# Patient Record
Sex: Female | Born: 1965
Health system: Southern US, Community
[De-identification: ages and names within clinical notes are randomized; demographics above are authoritative.]

## PROBLEM LIST (undated history)

## (undated) DIAGNOSIS — Z5189 Encounter for other specified aftercare: Secondary | ICD-10-CM

## (undated) DIAGNOSIS — A6000 Herpesviral infection of urogenital system, unspecified: Secondary | ICD-10-CM

## (undated) HISTORY — DX: Encounter for other specified aftercare: Z51.89

## (undated) HISTORY — PX: SPINE SURGERY: SHX786

## (undated) HISTORY — DX: Herpesviral infection of urogenital system, unspecified: A60.00

---

## 1997-12-07 ENCOUNTER — Ambulatory Visit (HOSPITAL_COMMUNITY): Admission: RE | Admit: 1997-12-07 | Discharge: 1997-12-07 | Payer: Self-pay | Admitting: Obstetrics and Gynecology

## 1999-07-20 ENCOUNTER — Emergency Department (HOSPITAL_COMMUNITY): Admission: EM | Admit: 1999-07-20 | Discharge: 1999-07-20 | Payer: Self-pay | Admitting: Emergency Medicine

## 2000-10-04 ENCOUNTER — Emergency Department (HOSPITAL_COMMUNITY): Admission: EM | Admit: 2000-10-04 | Discharge: 2000-10-04 | Payer: Self-pay | Admitting: Emergency Medicine

## 2001-11-06 ENCOUNTER — Other Ambulatory Visit: Admission: RE | Admit: 2001-11-06 | Discharge: 2001-11-06 | Payer: Self-pay | Admitting: Family Medicine

## 2002-03-19 ENCOUNTER — Encounter: Payer: Self-pay | Admitting: Obstetrics and Gynecology

## 2002-03-19 ENCOUNTER — Ambulatory Visit (HOSPITAL_COMMUNITY): Admission: RE | Admit: 2002-03-19 | Discharge: 2002-03-19 | Payer: Self-pay | Admitting: Obstetrics and Gynecology

## 2002-08-12 ENCOUNTER — Inpatient Hospital Stay (HOSPITAL_COMMUNITY): Admission: AD | Admit: 2002-08-12 | Discharge: 2002-08-15 | Payer: Self-pay | Admitting: Obstetrics and Gynecology

## 2003-02-18 ENCOUNTER — Other Ambulatory Visit: Admission: RE | Admit: 2003-02-18 | Discharge: 2003-02-18 | Payer: Self-pay | Admitting: Obstetrics and Gynecology

## 2005-04-17 ENCOUNTER — Other Ambulatory Visit: Admission: RE | Admit: 2005-04-17 | Discharge: 2005-04-17 | Payer: Self-pay | Admitting: Emergency Medicine

## 2006-04-17 ENCOUNTER — Inpatient Hospital Stay (HOSPITAL_COMMUNITY): Admission: AD | Admit: 2006-04-17 | Discharge: 2006-04-18 | Payer: Self-pay | Admitting: Obstetrics and Gynecology

## 2013-03-04 ENCOUNTER — Other Ambulatory Visit (HOSPITAL_COMMUNITY)
Admission: RE | Admit: 2013-03-04 | Discharge: 2013-03-04 | Disposition: A | Discharge status: Home / Self Care | Payer: BC Managed Care – PPO | Source: Ambulatory Visit

## 2013-03-04 ENCOUNTER — Other Ambulatory Visit: Payer: Self-pay

## 2013-03-04 DIAGNOSIS — Z124 Encounter for screening for malignant neoplasm of cervix: Secondary | ICD-10-CM | POA: Insufficient documentation

## 2016-02-21 ENCOUNTER — Other Ambulatory Visit: Payer: Self-pay

## 2016-02-21 DIAGNOSIS — Z1231 Encounter for screening mammogram for malignant neoplasm of breast: Secondary | ICD-10-CM

## 2016-03-11 ENCOUNTER — Other Ambulatory Visit (HOSPITAL_COMMUNITY)
Admission: RE | Admit: 2016-03-11 | Discharge: 2016-03-11 | Disposition: A | Discharge status: Home / Self Care | Payer: Commercial Managed Care - PPO | Source: Ambulatory Visit | Attending: Family Medicine | Admitting: Family Medicine

## 2016-03-11 ENCOUNTER — Other Ambulatory Visit: Payer: Self-pay | Admitting: Family Medicine

## 2016-03-11 ENCOUNTER — Ambulatory Visit
Admission: RE | Admit: 2016-03-11 | Discharge: 2016-03-11 | Disposition: A | Discharge status: Home / Self Care | Payer: Commercial Managed Care - PPO | Source: Ambulatory Visit

## 2016-03-11 DIAGNOSIS — Z01419 Encounter for gynecological examination (general) (routine) without abnormal findings: Secondary | ICD-10-CM | POA: Diagnosis not present

## 2016-03-11 DIAGNOSIS — Z1231 Encounter for screening mammogram for malignant neoplasm of breast: Secondary | ICD-10-CM

## 2016-03-12 LAB — CYTOLOGY - PAP

## 2017-02-17 ENCOUNTER — Other Ambulatory Visit: Payer: Self-pay | Admitting: Family Medicine

## 2017-02-17 DIAGNOSIS — Z1231 Encounter for screening mammogram for malignant neoplasm of breast: Secondary | ICD-10-CM

## 2017-03-12 ENCOUNTER — Ambulatory Visit
Admission: RE | Admit: 2017-03-12 | Discharge: 2017-03-12 | Disposition: A | Discharge status: Home / Self Care | Payer: Commercial Managed Care - PPO | Source: Ambulatory Visit | Attending: Family Medicine | Admitting: Family Medicine

## 2017-03-12 DIAGNOSIS — Z131 Encounter for screening for diabetes mellitus: Secondary | ICD-10-CM | POA: Diagnosis not present

## 2017-03-12 DIAGNOSIS — Z1231 Encounter for screening mammogram for malignant neoplasm of breast: Secondary | ICD-10-CM | POA: Diagnosis not present

## 2017-03-12 DIAGNOSIS — Z0000 Encounter for general adult medical examination without abnormal findings: Secondary | ICD-10-CM | POA: Diagnosis not present

## 2017-03-12 DIAGNOSIS — Z13220 Encounter for screening for lipoid disorders: Secondary | ICD-10-CM | POA: Diagnosis not present

## 2018-03-09 ENCOUNTER — Other Ambulatory Visit: Payer: Self-pay | Admitting: Family Medicine

## 2018-03-09 DIAGNOSIS — Z1231 Encounter for screening mammogram for malignant neoplasm of breast: Secondary | ICD-10-CM

## 2018-03-19 DIAGNOSIS — Z131 Encounter for screening for diabetes mellitus: Secondary | ICD-10-CM | POA: Diagnosis not present

## 2018-03-19 DIAGNOSIS — Z0000 Encounter for general adult medical examination without abnormal findings: Secondary | ICD-10-CM | POA: Diagnosis not present

## 2018-03-19 DIAGNOSIS — Z1211 Encounter for screening for malignant neoplasm of colon: Secondary | ICD-10-CM | POA: Diagnosis not present

## 2018-03-19 DIAGNOSIS — Z13220 Encounter for screening for lipoid disorders: Secondary | ICD-10-CM | POA: Diagnosis not present

## 2018-03-30 ENCOUNTER — Ambulatory Visit: Payer: Commercial Managed Care - PPO

## 2018-04-20 ENCOUNTER — Ambulatory Visit: Payer: Commercial Managed Care - PPO

## 2018-04-30 ENCOUNTER — Ambulatory Visit
Admission: RE | Admit: 2018-04-30 | Discharge: 2018-04-30 | Disposition: A | Discharge status: Home / Self Care | Payer: Commercial Managed Care - PPO | Source: Ambulatory Visit | Attending: Family Medicine | Admitting: Family Medicine

## 2018-04-30 DIAGNOSIS — Z1231 Encounter for screening mammogram for malignant neoplasm of breast: Secondary | ICD-10-CM

## 2018-05-11 DIAGNOSIS — Z1211 Encounter for screening for malignant neoplasm of colon: Secondary | ICD-10-CM | POA: Diagnosis not present

## 2019-03-23 ENCOUNTER — Other Ambulatory Visit: Payer: Self-pay | Admitting: Family Medicine

## 2019-03-23 DIAGNOSIS — Z1231 Encounter for screening mammogram for malignant neoplasm of breast: Secondary | ICD-10-CM

## 2019-04-09 ENCOUNTER — Other Ambulatory Visit: Payer: Self-pay | Admitting: Family Medicine

## 2019-04-09 ENCOUNTER — Other Ambulatory Visit (HOSPITAL_COMMUNITY)
Admission: RE | Admit: 2019-04-09 | Discharge: 2019-04-09 | Disposition: A | Discharge status: Home / Self Care | Payer: Commercial Managed Care - PPO | Source: Ambulatory Visit | Attending: Family Medicine | Admitting: Family Medicine

## 2019-04-09 DIAGNOSIS — Z01411 Encounter for gynecological examination (general) (routine) with abnormal findings: Secondary | ICD-10-CM | POA: Diagnosis not present

## 2019-04-13 LAB — CYTOLOGY - PAP
Diagnosis: NEGATIVE
HPV: NOT DETECTED

## 2019-05-07 ENCOUNTER — Ambulatory Visit
Admission: RE | Admit: 2019-05-07 | Discharge: 2019-05-07 | Disposition: A | Discharge status: Home / Self Care | Payer: Commercial Managed Care - PPO | Source: Ambulatory Visit | Attending: Family Medicine | Admitting: Family Medicine

## 2019-05-07 ENCOUNTER — Other Ambulatory Visit: Payer: Self-pay

## 2019-05-07 DIAGNOSIS — Z1231 Encounter for screening mammogram for malignant neoplasm of breast: Secondary | ICD-10-CM

## 2019-05-11 ENCOUNTER — Other Ambulatory Visit: Payer: Self-pay | Admitting: Family Medicine

## 2019-05-11 DIAGNOSIS — R928 Other abnormal and inconclusive findings on diagnostic imaging of breast: Secondary | ICD-10-CM

## 2019-05-13 ENCOUNTER — Ambulatory Visit: Payer: Commercial Managed Care - PPO

## 2019-05-13 ENCOUNTER — Ambulatory Visit
Admission: RE | Admit: 2019-05-13 | Discharge: 2019-05-13 | Disposition: A | Discharge status: Home / Self Care | Payer: Commercial Managed Care - PPO | Source: Ambulatory Visit | Attending: Family Medicine | Admitting: Family Medicine

## 2019-05-13 ENCOUNTER — Other Ambulatory Visit: Payer: Self-pay

## 2019-05-13 DIAGNOSIS — R928 Other abnormal and inconclusive findings on diagnostic imaging of breast: Secondary | ICD-10-CM

## 2019-12-18 ENCOUNTER — Emergency Department (HOSPITAL_COMMUNITY): Payer: Commercial Managed Care - PPO

## 2019-12-18 ENCOUNTER — Inpatient Hospital Stay (HOSPITAL_COMMUNITY)
Admission: EM | Admit: 2019-12-18 | Discharge: 2020-01-11 | DRG: 853 | Disposition: A | Discharge status: Rehabilitation Facility | Payer: Commercial Managed Care - PPO | Attending: Internal Medicine | Admitting: Internal Medicine

## 2019-12-18 ENCOUNTER — Encounter (HOSPITAL_COMMUNITY): Payer: Self-pay | Admitting: Emergency Medicine

## 2019-12-18 ENCOUNTER — Other Ambulatory Visit: Payer: Self-pay

## 2019-12-18 DIAGNOSIS — Z20822 Contact with and (suspected) exposure to covid-19: Secondary | ICD-10-CM | POA: Diagnosis present

## 2019-12-18 DIAGNOSIS — K0889 Other specified disorders of teeth and supporting structures: Secondary | ICD-10-CM | POA: Diagnosis present

## 2019-12-18 DIAGNOSIS — K0881 Primary occlusal trauma: Secondary | ICD-10-CM | POA: Diagnosis present

## 2019-12-18 DIAGNOSIS — Z972 Presence of dental prosthetic device (complete) (partial): Secondary | ICD-10-CM

## 2019-12-18 DIAGNOSIS — K036 Deposits [accretions] on teeth: Secondary | ICD-10-CM | POA: Diagnosis present

## 2019-12-18 DIAGNOSIS — R55 Syncope and collapse: Secondary | ICD-10-CM | POA: Diagnosis not present

## 2019-12-18 DIAGNOSIS — S0181XA Laceration without foreign body of other part of head, initial encounter: Secondary | ICD-10-CM | POA: Diagnosis present

## 2019-12-18 DIAGNOSIS — M270 Developmental disorders of jaws: Secondary | ICD-10-CM | POA: Diagnosis present

## 2019-12-18 DIAGNOSIS — K08409 Partial loss of teeth, unspecified cause, unspecified class: Secondary | ICD-10-CM | POA: Diagnosis present

## 2019-12-18 DIAGNOSIS — K029 Dental caries, unspecified: Secondary | ICD-10-CM | POA: Diagnosis present

## 2019-12-18 DIAGNOSIS — K083 Retained dental root: Secondary | ICD-10-CM | POA: Diagnosis present

## 2019-12-18 DIAGNOSIS — M4302 Spondylolysis, cervical region: Secondary | ICD-10-CM | POA: Diagnosis present

## 2019-12-18 DIAGNOSIS — M2578 Osteophyte, vertebrae: Secondary | ICD-10-CM | POA: Diagnosis present

## 2019-12-18 DIAGNOSIS — G8250 Quadriplegia, unspecified: Secondary | ICD-10-CM

## 2019-12-18 DIAGNOSIS — Z880 Allergy status to penicillin: Secondary | ICD-10-CM

## 2019-12-18 DIAGNOSIS — G9589 Other specified diseases of spinal cord: Secondary | ICD-10-CM | POA: Diagnosis present

## 2019-12-18 DIAGNOSIS — I2694 Multiple subsegmental pulmonary emboli without acute cor pulmonale: Secondary | ICD-10-CM | POA: Diagnosis present

## 2019-12-18 DIAGNOSIS — K045 Chronic apical periodontitis: Secondary | ICD-10-CM | POA: Diagnosis present

## 2019-12-18 DIAGNOSIS — K254 Chronic or unspecified gastric ulcer with hemorrhage: Secondary | ICD-10-CM | POA: Diagnosis not present

## 2019-12-18 DIAGNOSIS — I959 Hypotension, unspecified: Secondary | ICD-10-CM

## 2019-12-18 DIAGNOSIS — K06020 Generalized gingival recession, unspecified: Secondary | ICD-10-CM | POA: Diagnosis present

## 2019-12-18 DIAGNOSIS — K3189 Other diseases of stomach and duodenum: Secondary | ICD-10-CM | POA: Diagnosis present

## 2019-12-18 DIAGNOSIS — R571 Hypovolemic shock: Principal | ICD-10-CM | POA: Diagnosis present

## 2019-12-18 DIAGNOSIS — M2630 Unspecified anomaly of tooth position of fully erupted tooth or teeth: Secondary | ICD-10-CM | POA: Diagnosis present

## 2019-12-18 DIAGNOSIS — E440 Moderate protein-calorie malnutrition: Secondary | ICD-10-CM | POA: Insufficient documentation

## 2019-12-18 DIAGNOSIS — Z87891 Personal history of nicotine dependence: Secondary | ICD-10-CM

## 2019-12-18 DIAGNOSIS — M4802 Spinal stenosis, cervical region: Secondary | ICD-10-CM

## 2019-12-18 DIAGNOSIS — S14153A Other incomplete lesion at C3 level of cervical spinal cord, initial encounter: Secondary | ICD-10-CM | POA: Diagnosis present

## 2019-12-18 DIAGNOSIS — Z98891 History of uterine scar from previous surgery: Secondary | ICD-10-CM

## 2019-12-18 DIAGNOSIS — R7881 Bacteremia: Secondary | ICD-10-CM

## 2019-12-18 DIAGNOSIS — K089 Disorder of teeth and supporting structures, unspecified: Secondary | ICD-10-CM

## 2019-12-18 DIAGNOSIS — A408 Other streptococcal sepsis: Secondary | ICD-10-CM | POA: Diagnosis not present

## 2019-12-18 DIAGNOSIS — F1290 Cannabis use, unspecified, uncomplicated: Secondary | ICD-10-CM | POA: Diagnosis present

## 2019-12-18 DIAGNOSIS — Z419 Encounter for procedure for purposes other than remedying health state, unspecified: Secondary | ICD-10-CM

## 2019-12-18 DIAGNOSIS — E876 Hypokalemia: Secondary | ICD-10-CM | POA: Diagnosis present

## 2019-12-18 DIAGNOSIS — N179 Acute kidney failure, unspecified: Secondary | ICD-10-CM | POA: Diagnosis present

## 2019-12-18 DIAGNOSIS — Y92009 Unspecified place in unspecified non-institutional (private) residence as the place of occurrence of the external cause: Secondary | ICD-10-CM

## 2019-12-18 DIAGNOSIS — G8254 Quadriplegia, C5-C7 incomplete: Secondary | ICD-10-CM

## 2019-12-18 DIAGNOSIS — W1839XA Other fall on same level, initial encounter: Secondary | ICD-10-CM | POA: Diagnosis present

## 2019-12-18 DIAGNOSIS — F40232 Fear of other medical care: Secondary | ICD-10-CM | POA: Diagnosis present

## 2019-12-18 DIAGNOSIS — W19XXXA Unspecified fall, initial encounter: Secondary | ICD-10-CM

## 2019-12-18 DIAGNOSIS — Z681 Body mass index (BMI) 19 or less, adult: Secondary | ICD-10-CM

## 2019-12-18 DIAGNOSIS — J309 Allergic rhinitis, unspecified: Secondary | ICD-10-CM | POA: Diagnosis present

## 2019-12-18 DIAGNOSIS — Z803 Family history of malignant neoplasm of breast: Secondary | ICD-10-CM

## 2019-12-18 DIAGNOSIS — E860 Dehydration: Secondary | ICD-10-CM | POA: Diagnosis present

## 2019-12-18 DIAGNOSIS — D62 Acute posthemorrhagic anemia: Secondary | ICD-10-CM | POA: Diagnosis not present

## 2019-12-18 DIAGNOSIS — M5001 Cervical disc disorder with myelopathy,  high cervical region: Secondary | ICD-10-CM | POA: Diagnosis present

## 2019-12-18 LAB — CBC
HCT: 34 % — ABNORMAL LOW (ref 36.0–46.0)
Hemoglobin: 10.7 g/dL — ABNORMAL LOW (ref 12.0–15.0)
MCH: 29.6 pg (ref 26.0–34.0)
MCHC: 31.5 g/dL (ref 30.0–36.0)
MCV: 94.2 fL (ref 80.0–100.0)
Platelets: 260 10*3/uL (ref 150–400)
RBC: 3.61 MIL/uL — ABNORMAL LOW (ref 3.87–5.11)
RDW: 11.9 % (ref 11.5–15.5)
WBC: 7.9 10*3/uL (ref 4.0–10.5)
nRBC: 0 % (ref 0.0–0.2)

## 2019-12-18 LAB — I-STAT CHEM 8, ED
BUN: 6 mg/dL (ref 6–20)
Calcium, Ion: 1.12 mmol/L — ABNORMAL LOW (ref 1.15–1.40)
Chloride: 106 mmol/L (ref 98–111)
Creatinine, Ser: 1.1 mg/dL — ABNORMAL HIGH (ref 0.44–1.00)
Glucose, Bld: 124 mg/dL — ABNORMAL HIGH (ref 70–99)
HCT: 31 % — ABNORMAL LOW (ref 36.0–46.0)
Hemoglobin: 10.5 g/dL — ABNORMAL LOW (ref 12.0–15.0)
Potassium: 3.3 mmol/L — ABNORMAL LOW (ref 3.5–5.1)
Sodium: 141 mmol/L (ref 135–145)
TCO2: 22 mmol/L (ref 22–32)

## 2019-12-18 LAB — COMPREHENSIVE METABOLIC PANEL
ALT: 11 U/L (ref 0–44)
AST: 19 U/L (ref 15–41)
Albumin: 3.1 g/dL — ABNORMAL LOW (ref 3.5–5.0)
Alkaline Phosphatase: 130 U/L — ABNORMAL HIGH (ref 38–126)
Anion gap: 6 (ref 5–15)
BUN: 7 mg/dL (ref 6–20)
CO2: 22 mmol/L (ref 22–32)
Calcium: 8.2 mg/dL — ABNORMAL LOW (ref 8.9–10.3)
Chloride: 111 mmol/L (ref 98–111)
Creatinine, Ser: 1.19 mg/dL — ABNORMAL HIGH (ref 0.44–1.00)
GFR calc Af Amer: >60 mL/min (ref >60)
GFR calc non Af Amer: 52 mL/min — ABNORMAL LOW (ref >60)
Glucose, Bld: 130 mg/dL — ABNORMAL HIGH (ref 70–99)
Potassium: 3.3 mmol/L — ABNORMAL LOW (ref 3.5–5.1)
Sodium: 139 mmol/L (ref 135–145)
Total Bilirubin: 0.6 mg/dL (ref 0.3–1.2)
Total Protein: 5.6 g/dL — ABNORMAL LOW (ref 6.5–8.1)

## 2019-12-18 LAB — PROTIME-INR
INR: 1.1 (ref 0.8–1.2)
Prothrombin Time: 14.5 seconds (ref 11.4–15.2)

## 2019-12-18 LAB — SAMPLE TO BLOOD BANK

## 2019-12-18 LAB — CDS SEROLOGY

## 2019-12-18 LAB — LACTIC ACID, PLASMA: Lactic Acid, Venous: 1.8 mmol/L (ref 0.5–1.9)

## 2019-12-18 LAB — TROPONIN I (HIGH SENSITIVITY): Troponin I (High Sensitivity): <2 ng/L (ref <18)

## 2019-12-18 LAB — I-STAT BETA HCG BLOOD, ED (MC, WL, AP ONLY): I-stat hCG, quantitative: <5 m[IU]/mL (ref <5)

## 2019-12-18 LAB — ETHANOL: Alcohol, Ethyl (B): <10 mg/dL (ref <10)

## 2019-12-18 LAB — MAGNESIUM: Magnesium: 1.6 mg/dL — ABNORMAL LOW (ref 1.7–2.4)

## 2019-12-18 MED ORDER — SODIUM CHLORIDE 0.9 % IV BOLUS
1000.0000 mL | Freq: Once | INTRAVENOUS | Status: AC
  Administered 2019-12-18: 1000 mL via INTRAVENOUS

## 2019-12-18 MED ORDER — NOREPINEPHRINE 4 MG/250ML-% IV SOLN
0.0000 ug/min | INTRAVENOUS | Status: DC
  Administered 2019-12-18: 2 ug/min via INTRAVENOUS
  Filled 2019-12-18: qty 250

## 2019-12-18 MED ORDER — IOHEXOL 350 MG/ML SOLN
100.0000 mL | Freq: Once | INTRAVENOUS | Status: AC | PRN
  Administered 2019-12-18: 100 mL via INTRAVENOUS

## 2019-12-18 NOTE — ED Triage Notes (Signed)
Pt coming from home after family called due to patient passing out this evening. Family said pt was in bed most of today sleeping, which is abnormal for her, and got up to make dinner then passing out in kitchen. LOC unknown by family or pt. No blood thinners. Pt has been alert and oriented but very fatigued and slow to respond. Pt has been hypotensive with BP being around 68/40. Received 250cc w EMS. Pt stating she has pain all over and some facial injuries with several loose teeth

## 2019-12-18 NOTE — ED Provider Notes (Signed)
[facility] EMERGENCY DEPARTMENT Provider Note   CSN: [id number] Arrival date & time: 12/18/19  2217     History No chief complaint on file.   Anna Campos is a 54 y.o. female.  The history is provided by the patient, medical records and the EMS personnel. No language interpreter was used.  Loss of Consciousness Episode history:  Single Most recent episode:  Today Timing:  Constant Relieved by:  Nothing Ineffective treatments:  None tried Associated symptoms: chest pain, headaches and malaise/fatigue   Associated symptoms: no confusion, no diaphoresis, no difficulty breathing, no dizziness, no fever, no focal weakness, no nausea, no palpitations, no recent fall, no recent injury, no seizures, no shortness of breath, no vomiting and no weakness        No past medical history on file.  There are no problems to display for this patient.   No past surgical history on file.   OB History   No obstetric history on file.     Family History  Problem Relation Age of Onset  . Breast cancer Mother     Social History   Tobacco Use  . Smoking status: Not on file  Substance Use Topics  . Alcohol use: Not on file  . Drug use: Not on file    Home Medications Prior to Admission medications   Not on File    Allergies    Patient has no allergy information on record.  Review of Systems   Review of Systems  Constitutional: Positive for fatigue and malaise/fatigue. Negative for chills, diaphoresis and fever.  HENT: Negative for congestion.   Eyes: Negative for visual disturbance.  Respiratory: Negative for cough, chest tightness, shortness of breath and wheezing.   Cardiovascular: Positive for chest pain and syncope. Negative for palpitations and leg swelling.  Gastrointestinal: Positive for abdominal pain. Negative for constipation, diarrhea, nausea and vomiting.  Genitourinary: Negative for dysuria, flank pain and frequency.  Musculoskeletal: Positive  for back pain and neck pain. Negative for neck stiffness.  Skin: Negative for rash and wound.  Neurological: Positive for headaches. Negative for dizziness, focal weakness, seizures, weakness and light-headedness.  Psychiatric/Behavioral: Negative for agitation and confusion.  All other systems reviewed and are negative.   Physical Exam Updated Vital Signs BP (!) 85/55   Pulse 92   Temp (!) 97.5 F (36.4 C) (Oral)   Resp 11   Ht 5\' 7"  (1.702 m)   Wt 59 kg   SpO2 100%   BMI 20.36 kg/m   Physical Exam Vitals and nursing note reviewed.  Constitutional:      General: She is not in acute distress.    Appearance: She is well-developed. She is not ill-appearing, toxic-appearing or diaphoretic.  HENT:     Head:     Comments: Patient has dentures and has some bleeding on her upper gums.  The right incisor is slightly loose and is held by the upper denture.  I do not see a laceration on my inspection.  Tenderness in the maxilla.  Also diffuse tenderness in the chest, abdomen, back, and chest.    Nose: Nose normal. No congestion or rhinorrhea.     Mouth/Throat:     Mouth: Mucous membranes are moist.     Pharynx: No oropharyngeal exudate or posterior oropharyngeal erythema.  Eyes:     Conjunctiva/sclera: Conjunctivae normal.     Pupils: Pupils are equal, round, and reactive to light.  Cardiovascular:     Rate and Rhythm: Normal rate  and regular rhythm.     Heart sounds: No murmur.  Pulmonary:     Effort: Pulmonary effort is normal. No respiratory distress.     Breath sounds: Normal breath sounds. No wheezing, rhonchi or rales.  Chest:     Chest wall: Tenderness present.  Abdominal:     General: Abdomen is flat.     Palpations: Abdomen is soft.     Tenderness: There is abdominal tenderness. There is no right CVA tenderness, left CVA tenderness, guarding or rebound.  Musculoskeletal:        General: Tenderness present.     Cervical back: Neck supple.     Right lower leg: No  edema.     Left lower leg: No edema.  Skin:    General: Skin is warm and dry.     Capillary Refill: Capillary refill takes less than 2 seconds.     Findings: No erythema.  Neurological:     General: No focal deficit present.     Mental Status: She is alert.     Sensory: No sensory deficit.     Motor: No weakness.  Psychiatric:        Mood and Affect: Mood normal.     ED Results / Procedures / Treatments   Labs (all labs ordered are listed, but only abnormal results are displayed) Labs Reviewed  COMPREHENSIVE METABOLIC PANEL - Abnormal; Notable for the following components:      Result Value   Potassium 3.3 (*)    Glucose, Bld 130 (*)    Creatinine, Ser 1.19 (*)    Calcium 8.2 (*)    Total Protein 5.6 (*)    Albumin 3.1 (*)    Alkaline Phosphatase 130 (*)    GFR calc non Af Amer 52 (*)    All other components within normal limits  CBC - Abnormal; Notable for the following components:   RBC 3.61 (*)    Hemoglobin 10.7 (*)    HCT 34.0 (*)    All other components within normal limits  MAGNESIUM - Abnormal; Notable for the following components:   Magnesium 1.6 (*)    All other components within normal limits  I-STAT CHEM 8, ED - Abnormal; Notable for the following components:   Potassium 3.3 (*)    Creatinine, Ser 1.10 (*)    Glucose, Bld 124 (*)    Calcium, Ion 1.12 (*)    Hemoglobin 10.5 (*)    HCT 31.0 (*)    All other components within normal limits  RESPIRATORY PANEL BY RT PCR (FLU A&B, COVID)  URINE CULTURE  CULTURE, BLOOD (ROUTINE X 2)  CULTURE, BLOOD (ROUTINE X 2)  CDS SEROLOGY  ETHANOL  LACTIC ACID, PLASMA  PROTIME-INR  URINALYSIS, ROUTINE W REFLEX MICROSCOPIC  RAPID URINE DRUG SCREEN, HOSP PERFORMED  TSH  CORTISOL  I-STAT BETA HCG BLOOD, ED ([facility], [facility], [facility] ONLY)  I-STAT CHEM 8, ED  SAMPLE TO BLOOD BANK  TROPONIN I (HIGH SENSITIVITY)  TROPONIN I (HIGH SENSITIVITY)    EKG EKG Interpretation  Date/Time:  Saturday December 18 2019 23:29:58  EST Ventricular Rate:  93 PR Interval:    QRS Duration: 72 QT Interval:  381 QTC Calculation: 474 R Axis:   83 Text Interpretation: Sinus rhythm Probable left atrial enlargement Anteroseptal infarct, old Abnormal T, consider ischemia, diffuse leads No prior ECG for comparison. No STEMI Confirmed by Theda Belfast ([id number]) on 12/18/2019 11:32:31 PM    Radiology CT Head Wo Contrast  Result Date: 12/18/2019 CLINICAL  DATA:  Syncope, somnolent EXAM: CT HEAD WITHOUT CONTRAST TECHNIQUE: Contiguous axial images were obtained from the base of the skull through the vertex without intravenous contrast. COMPARISON:  None. FINDINGS: Brain: No acute infarct or hemorrhage. Lateral ventricles and midline structures are unremarkable. No acute extra-axial fluid collections. No mass effect. Vascular: No hyperdense vessel or unexpected calcification. Skull: Normal. Negative for fracture or focal lesion. Sinuses/Orbits: No acute finding. Other: None IMPRESSION: 1. No acute intracranial process. Electronically Signed   By: Sharlet SalinaMichael  Brown M.D.   On: 12/18/2019 23:22   CT Chest W Contrast  Result Date: 12/18/2019 CLINICAL DATA:  Chest trauma. EXAM: CT CHEST, ABDOMEN, AND PELVIS WITH CONTRAST TECHNIQUE: Multidetector CT imaging of the chest, abdomen and pelvis was performed following the standard protocol during bolus administration of intravenous contrast. CONTRAST:  100mL OMNIPAQUE IOHEXOL 350 MG/ML SOLN COMPARISON:  None. FINDINGS: CT CHEST FINDINGS Cardiovascular: There is no large centrally located pulmonary embolism. No evidence for a thoracic aortic aneurysm or dissection. The visualized arch vessels are grossly patent. Minimal atherosclerotic changes are noted of the thoracic aorta. Mediastinum/Nodes: --No mediastinal or hilar lymphadenopathy. --No axillary lymphadenopathy. --No supraclavicular lymphadenopathy. --Normal thyroid gland. --The esophagus is unremarkable Lungs/Pleura: No pulmonary nodules or masses. No  pleural effusion or pneumothorax. No focal airspace consolidation. No focal pleural abnormality. Musculoskeletal: No chest wall abnormality. No acute or significant osseous findings. CT ABDOMEN PELVIS FINDINGS Hepatobiliary: The liver is normal. Normal gallbladder.There is no biliary ductal dilation. Pancreas: Normal contours without ductal dilatation. No peripancreatic fluid collection. Spleen: No splenic laceration or hematoma. Adrenals/Urinary Tract: --Adrenal glands: No adrenal hemorrhage. --Right kidney/ureter: No hydronephrosis or perinephric hematoma. --Left kidney/ureter: No hydronephrosis or perinephric hematoma. --Urinary bladder: Unremarkable. Stomach/Bowel: --Stomach/Duodenum: No hiatal hernia or other gastric abnormality. Normal duodenal course and caliber. --Small bowel: No dilatation or inflammation. --Colon: No focal abnormality. --Appendix: Normal. Vascular/Lymphatic: Normal course and caliber of the major abdominal vessels. --No retroperitoneal lymphadenopathy. --No mesenteric lymphadenopathy. --No pelvic or inguinal lymphadenopathy. Reproductive: Unremarkable Other: No ascites or free air. The abdominal wall is normal. Musculoskeletal. No acute displaced fractures. IMPRESSION: 1. No acute finding of the chest, abdomen or pelvis. 2. Minimal atherosclerotic changes of the thoracic aorta. Electronically Signed   By: Katherine Mantlehristopher  Green M.D.   On: 12/18/2019 23:25   CT Cervical Spine Wo Contrast  Result Date: 12/18/2019 CLINICAL DATA:  Syncope, somnolent EXAM: CT CERVICAL SPINE WITHOUT CONTRAST TECHNIQUE: Multidetector CT imaging of the cervical spine was performed without intravenous contrast. Multiplanar CT image reconstructions were also generated. COMPARISON:  None. FINDINGS: Alignment: Alignment is anatomic. Skull base and vertebrae: No acute displaced fractures. Soft tissues and spinal canal: No prevertebral fluid or swelling. No visible canal hematoma. Disc levels: There is extensive  multilevel cervical spondylosis with disc space narrowing and circumferential osteophyte formation seen at C3-4, C4-5, C5-6, and C6-7. There is symmetrical neural foraminal encroachment at these levels. Upper chest: Airway is patent.  Lung apices are clear. Other: Reconstructed images demonstrate no additional findings. IMPRESSION: 1. Extensive multilevel cervical spondylosis.  No acute fracture. Electronically Signed   By: Sharlet SalinaMichael  Brown M.D.   On: 12/18/2019 23:23   CT ABDOMEN PELVIS W CONTRAST  Result Date: 12/18/2019 CLINICAL DATA:  Chest trauma. EXAM: CT CHEST, ABDOMEN, AND PELVIS WITH CONTRAST TECHNIQUE: Multidetector CT imaging of the chest, abdomen and pelvis was performed following the standard protocol during bolus administration of intravenous contrast. CONTRAST:  100mL OMNIPAQUE IOHEXOL 350 MG/ML SOLN COMPARISON:  None. FINDINGS: CT CHEST FINDINGS Cardiovascular: There  is no large centrally located pulmonary embolism. No evidence for a thoracic aortic aneurysm or dissection. The visualized arch vessels are grossly patent. Minimal atherosclerotic changes are noted of the thoracic aorta. Mediastinum/Nodes: --No mediastinal or hilar lymphadenopathy. --No axillary lymphadenopathy. --No supraclavicular lymphadenopathy. --Normal thyroid gland. --The esophagus is unremarkable Lungs/Pleura: No pulmonary nodules or masses. No pleural effusion or pneumothorax. No focal airspace consolidation. No focal pleural abnormality. Musculoskeletal: No chest wall abnormality. No acute or significant osseous findings. CT ABDOMEN PELVIS FINDINGS Hepatobiliary: The liver is normal. Normal gallbladder.There is no biliary ductal dilation. Pancreas: Normal contours without ductal dilatation. No peripancreatic fluid collection. Spleen: No splenic laceration or hematoma. Adrenals/Urinary Tract: --Adrenal glands: No adrenal hemorrhage. --Right kidney/ureter: No hydronephrosis or perinephric hematoma. --Left kidney/ureter: No  hydronephrosis or perinephric hematoma. --Urinary bladder: Unremarkable. Stomach/Bowel: --Stomach/Duodenum: No hiatal hernia or other gastric abnormality. Normal duodenal course and caliber. --Small bowel: No dilatation or inflammation. --Colon: No focal abnormality. --Appendix: Normal. Vascular/Lymphatic: Normal course and caliber of the major abdominal vessels. --No retroperitoneal lymphadenopathy. --No mesenteric lymphadenopathy. --No pelvic or inguinal lymphadenopathy. Reproductive: Unremarkable Other: No ascites or free air. The abdominal wall is normal. Musculoskeletal. No acute displaced fractures. IMPRESSION: 1. No acute finding of the chest, abdomen or pelvis. 2. Minimal atherosclerotic changes of the thoracic aorta. Electronically Signed   By: Katherine Mantle M.D.   On: 12/18/2019 23:25   DG Pelvis Portable  Result Date: 12/18/2019 CLINICAL DATA:  Syncope, fell EXAM: PORTABLE PELVIS 1-2 VIEWS COMPARISON:  None. FINDINGS: Supine frontal view of the pelvis was performed. There are no acute displaced fractures. The hips are well aligned. Joint spaces are well preserved. Sacroiliac joints are normal. IMPRESSION: 1. No acute displaced fracture. Electronically Signed   By: Sharlet Salina M.D.   On: 12/18/2019 22:47   DG Chest Portable 1 View  Result Date: 12/18/2019 CLINICAL DATA:  Syncope, fell EXAM: PORTABLE CHEST 1 VIEW COMPARISON:  None. FINDINGS: The heart size and mediastinal contours are within normal limits. Both lungs are clear. The visualized skeletal structures are unremarkable. IMPRESSION: No active disease. Electronically Signed   By: Sharlet Salina M.D.   On: 12/18/2019 22:47   CT Maxillofacial Wo Contrast  Result Date: 12/18/2019 CLINICAL DATA:  Facial trauma, syncope, somnolent EXAM: CT MAXILLOFACIAL WITHOUT CONTRAST TECHNIQUE: Multidetector CT imaging of the maxillofacial structures was performed. Multiplanar CT image reconstructions were also generated. COMPARISON:  None.  FINDINGS: Osseous: No fracture or mandibular dislocation. No destructive process. Orbits: Negative. No traumatic or inflammatory finding. Sinuses: Clear. Soft tissues: Negative. Limited intracranial: No significant or unexpected finding. IMPRESSION: 1. No acute facial bone fracture. Electronically Signed   By: Sharlet Salina M.D.   On: 12/18/2019 23:26    Procedures Procedures (including critical care time)  CRITICAL CARE Performed by: Canary Brim Klay Sobotka Total critical care time: 45 minutes Critical care time was exclusive of separately billable procedures and treating other patients. Critical care was necessary to treat or prevent imminent or life-threatening deterioration. Critical care was time spent personally by me on the following activities: development of treatment plan with patient and/or surrogate as well as nursing, discussions with consultants, evaluation of patient's response to treatment, examination of patient, obtaining history from patient or surrogate, ordering and performing treatments and interventions, ordering and review of laboratory studies, ordering and review of radiographic studies, pulse oximetry and re-evaluation of patient's condition.   Medications Ordered in ED Medications  norepinephrine (LEVOPHED) 4mg  in premix infusion (8 mcg/min Intravenous Rate/Dose Change 12/19/19 0004)  sodium chloride 0.9 % bolus 1,000 mL (0 mLs Intravenous Stopped 12/18/19 2245)  sodium chloride 0.9 % bolus 1,000 mL (0 mLs Intravenous Stopped 12/18/19 2337)  iohexol (OMNIPAQUE) 350 MG/ML injection 100 mL (100 mLs Intravenous Contrast Given 12/18/19 2309)  sodium chloride 0.9 % bolus 1,000 mL (1,000 mLs Intravenous New Bag/Given 12/19/19 0010)  sodium chloride 0.9 % bolus 1,000 mL (0 mLs Intravenous Stopped 12/19/19 0005)    ED Course  I have reviewed the triage vital signs and the nursing notes.  Pertinent labs & imaging results that were available during my care of the patient  were reviewed by me and considered in my medical decision making (see chart for details).    MDM Rules/Calculators/A&P                      Anna Campos is a 54 y.o. female with no significant past medical history who presents for syncope and pain after the fall.  She is brought in by EMS.  According to patient, she has felt tired fatigued today.  She then tried to get up to go to the kitchen to cook some dinner for her family when she got very lightheaded and either completely passed out or nearly passed out causing her to fall to ground.  She sustained injury to her face when she hit the ground.  She is Planing of pain in her head, neck, face, Chest, abdomen, and pelvis.  She denies pain in her extremities.  She also reports pain in her back.  She denies any pain or symptoms before the syncopal/near syncopal episode with a fall.  Patient's blood pressure was EMS to be found 68/40.  She started getting fluids with EMS on arrival.  On my evaluation, patient had dentures in place that we were able to remove the lowers.  The uppers seem to be stuck around a loose tooth in the upper jaw.  Patient had tenderness in her face at her maxilla, some bleeding from near where her dentures were, rest of the oropharyngeal exam did not show significant bleeding.  No stridor.  Chest diffusely tender.  Abdomen diffusely tender.  Pelvis tender.  Patient moving all extremities.  Patient was hypotensive.  Patient will given fluids and will have work-up for both trauma from the injuries from his fall as well as to find out why she is hypotensive.  Will work-up for possible infection with urine, chest x-ray, and labs.   She will be given fluids to help her blood pressure.  Based on her description of symptoms, it sounds like she was feeling tired and fatigued and had this syncopal episode leading to the injuries and pain.  I have a lower suspicion that her hypotension is due to the fall and thus at this time we will hold  on activating her as a trauma as her lightheadedness and syncope preceded the pain.  If injuries are discovered, patient may need trauma evaluation and consultation however at this time we will treat her as a hypotension syncope primarily with imaging to look for other injuries.  Anticipate admission for the syncope, hypotension, and diffuse pain.   Patient had portable chest x-ray and pelvis which did not show fracture or pneumothorax.  She does report that she smoked "a joint" today.  Patient will have trauma imaging of the head, neck, face, and chest/abdomen/pelvis to look for traumatic injuries.  CT images returned overall reassuring.  No evidence of fracture dislocation or significant bleeding.  The maxillofacial imaging did not show evidence of dental or maxillary fracture.  On further inspection, patient still has the upper denture in place that seems to be stuck on a slightly loose tooth.  The bleeding appears to have resolved and I do not see a laceration on my repeat inspection.  Anticipate her following up with dentistry for this.  More concerning was the patient's blood pressures remained low.  Her work-up showed negative troponin, lactic acid nonelevated.  Patient's chest x-ray and imaging did not show evidence of pneumonia.  Doubt infectious etiology at this time.  After her third liter of normal saline, she remains hypotensive and the blood pressure in the 70s.  She was started on Levophed.  I spoke with critical care who recommended 1/4 L of fluids as well as checking some other labs including TSH and cortisol level.  They will see the patient for further management of the unclear hypotension and syncope.  EKG showed ST depressions diffusely, cardiology was called who came to the patient.  They do not suspect this is cardiac in nature.  Patient will be admitted to critical care and cardiology will follow.  At this time, low suspicion for infectious etiology but suspect either  dehydration or other cause of the persistent hypotension despite fluids.  Do not suspect any significant traumatic injuries requiring trauma consultation.  Patient will be admitted for further management.   Final Clinical Impression(s) / ED Diagnoses Final diagnoses:  Hypotension, unspecified hypotension type  Syncope, unspecified syncope type  Fall, initial encounter     Clinical Impression: 1. Hypotension, unspecified hypotension type   2. Syncope, unspecified syncope type   3. Fall, initial encounter     Disposition: Admit  This note was prepared with assistance of Conservation officer, historic buildings. Occasional wrong-word or sound-a-like substitutions may have occurred due to the inherent limitations of voice recognition software.     Shimika Ames, Canary Brim, MD 12/19/19 [phone]

## 2019-12-19 ENCOUNTER — Inpatient Hospital Stay (HOSPITAL_COMMUNITY): Payer: Commercial Managed Care - PPO

## 2019-12-19 DIAGNOSIS — N179 Acute kidney failure, unspecified: Secondary | ICD-10-CM | POA: Diagnosis present

## 2019-12-19 DIAGNOSIS — M2630 Unspecified anomaly of tooth position of fully erupted tooth or teeth: Secondary | ICD-10-CM | POA: Diagnosis present

## 2019-12-19 DIAGNOSIS — R7881 Bacteremia: Secondary | ICD-10-CM | POA: Diagnosis not present

## 2019-12-19 DIAGNOSIS — K089 Disorder of teeth and supporting structures, unspecified: Secondary | ICD-10-CM | POA: Diagnosis not present

## 2019-12-19 DIAGNOSIS — G9589 Other specified diseases of spinal cord: Secondary | ICD-10-CM | POA: Diagnosis present

## 2019-12-19 DIAGNOSIS — M5001 Cervical disc disorder with myelopathy,  high cervical region: Secondary | ICD-10-CM | POA: Diagnosis present

## 2019-12-19 DIAGNOSIS — M270 Developmental disorders of jaws: Secondary | ICD-10-CM | POA: Diagnosis present

## 2019-12-19 DIAGNOSIS — I2699 Other pulmonary embolism without acute cor pulmonale: Secondary | ICD-10-CM | POA: Diagnosis not present

## 2019-12-19 DIAGNOSIS — I959 Hypotension, unspecified: Secondary | ICD-10-CM

## 2019-12-19 DIAGNOSIS — R55 Syncope and collapse: Secondary | ICD-10-CM | POA: Diagnosis present

## 2019-12-19 DIAGNOSIS — K0889 Other specified disorders of teeth and supporting structures: Secondary | ICD-10-CM | POA: Diagnosis not present

## 2019-12-19 DIAGNOSIS — R571 Hypovolemic shock: Secondary | ICD-10-CM | POA: Diagnosis present

## 2019-12-19 DIAGNOSIS — K045 Chronic apical periodontitis: Secondary | ICD-10-CM | POA: Diagnosis present

## 2019-12-19 DIAGNOSIS — E871 Hypo-osmolality and hyponatremia: Secondary | ICD-10-CM | POA: Diagnosis not present

## 2019-12-19 DIAGNOSIS — M2578 Osteophyte, vertebrae: Secondary | ICD-10-CM | POA: Diagnosis present

## 2019-12-19 DIAGNOSIS — K3189 Other diseases of stomach and duodenum: Secondary | ICD-10-CM | POA: Diagnosis present

## 2019-12-19 DIAGNOSIS — B955 Unspecified streptococcus as the cause of diseases classified elsewhere: Secondary | ICD-10-CM | POA: Diagnosis not present

## 2019-12-19 DIAGNOSIS — E440 Moderate protein-calorie malnutrition: Secondary | ICD-10-CM | POA: Diagnosis present

## 2019-12-19 DIAGNOSIS — K592 Neurogenic bowel, not elsewhere classified: Secondary | ICD-10-CM | POA: Diagnosis not present

## 2019-12-19 DIAGNOSIS — E876 Hypokalemia: Secondary | ICD-10-CM | POA: Diagnosis present

## 2019-12-19 DIAGNOSIS — Z20822 Contact with and (suspected) exposure to covid-19: Secondary | ICD-10-CM | POA: Diagnosis present

## 2019-12-19 DIAGNOSIS — K254 Chronic or unspecified gastric ulcer with hemorrhage: Secondary | ICD-10-CM | POA: Diagnosis not present

## 2019-12-19 DIAGNOSIS — I2694 Multiple subsegmental pulmonary emboli without acute cor pulmonale: Secondary | ICD-10-CM | POA: Diagnosis present

## 2019-12-19 DIAGNOSIS — Y92009 Unspecified place in unspecified non-institutional (private) residence as the place of occurrence of the external cause: Secondary | ICD-10-CM | POA: Diagnosis not present

## 2019-12-19 DIAGNOSIS — M4302 Spondylolysis, cervical region: Secondary | ICD-10-CM | POA: Diagnosis present

## 2019-12-19 DIAGNOSIS — M4800 Spinal stenosis, site unspecified: Secondary | ICD-10-CM | POA: Diagnosis not present

## 2019-12-19 DIAGNOSIS — S14153A Other incomplete lesion at C3 level of cervical spinal cord, initial encounter: Secondary | ICD-10-CM | POA: Diagnosis present

## 2019-12-19 DIAGNOSIS — G8254 Quadriplegia, C5-C7 incomplete: Secondary | ICD-10-CM | POA: Diagnosis not present

## 2019-12-19 DIAGNOSIS — N319 Neuromuscular dysfunction of bladder, unspecified: Secondary | ICD-10-CM | POA: Diagnosis not present

## 2019-12-19 DIAGNOSIS — Z681 Body mass index (BMI) 19 or less, adult: Secondary | ICD-10-CM | POA: Diagnosis not present

## 2019-12-19 DIAGNOSIS — S0181XA Laceration without foreign body of other part of head, initial encounter: Secondary | ICD-10-CM | POA: Diagnosis present

## 2019-12-19 DIAGNOSIS — D62 Acute posthemorrhagic anemia: Secondary | ICD-10-CM | POA: Diagnosis not present

## 2019-12-19 DIAGNOSIS — G8250 Quadriplegia, unspecified: Secondary | ICD-10-CM | POA: Diagnosis not present

## 2019-12-19 DIAGNOSIS — R7401 Elevation of levels of liver transaminase levels: Secondary | ICD-10-CM | POA: Diagnosis not present

## 2019-12-19 DIAGNOSIS — A408 Other streptococcal sepsis: Secondary | ICD-10-CM | POA: Diagnosis not present

## 2019-12-19 DIAGNOSIS — M4802 Spinal stenosis, cervical region: Secondary | ICD-10-CM | POA: Diagnosis present

## 2019-12-19 DIAGNOSIS — W1839XA Other fall on same level, initial encounter: Secondary | ICD-10-CM | POA: Diagnosis present

## 2019-12-19 DIAGNOSIS — D649 Anemia, unspecified: Secondary | ICD-10-CM | POA: Diagnosis not present

## 2019-12-19 DIAGNOSIS — E860 Dehydration: Secondary | ICD-10-CM | POA: Diagnosis present

## 2019-12-19 LAB — URINALYSIS, ROUTINE W REFLEX MICROSCOPIC
Bacteria, UA: NONE SEEN
Bilirubin Urine: NEGATIVE
Glucose, UA: NEGATIVE mg/dL
Ketones, ur: NEGATIVE mg/dL
Leukocytes,Ua: NEGATIVE
Nitrite: NEGATIVE
Protein, ur: NEGATIVE mg/dL
Specific Gravity, Urine: 1.045 — ABNORMAL HIGH (ref 1.005–1.030)
pH: 5 (ref 5.0–8.0)

## 2019-12-19 LAB — BASIC METABOLIC PANEL
Anion gap: 7 (ref 5–15)
BUN: 5 mg/dL — ABNORMAL LOW (ref 6–20)
CO2: 19 mmol/L — ABNORMAL LOW (ref 22–32)
Calcium: 7.8 mg/dL — ABNORMAL LOW (ref 8.9–10.3)
Chloride: 114 mmol/L — ABNORMAL HIGH (ref 98–111)
Creatinine, Ser: 0.77 mg/dL (ref 0.44–1.00)
GFR calc Af Amer: >60 mL/min (ref >60)
GFR calc non Af Amer: >60 mL/min (ref >60)
Glucose, Bld: 124 mg/dL — ABNORMAL HIGH (ref 70–99)
Potassium: 3.7 mmol/L (ref 3.5–5.1)
Sodium: 140 mmol/L (ref 135–145)

## 2019-12-19 LAB — ECHOCARDIOGRAM COMPLETE
Height: 67 in
Weight: 2102.31 oz

## 2019-12-19 LAB — TROPONIN I (HIGH SENSITIVITY): Troponin I (High Sensitivity): 14 ng/L (ref <18)

## 2019-12-19 LAB — RAPID URINE DRUG SCREEN, HOSP PERFORMED
Amphetamines: NOT DETECTED
Barbiturates: NOT DETECTED
Benzodiazepines: NOT DETECTED
Cocaine: NOT DETECTED
Opiates: NOT DETECTED
Tetrahydrocannabinol: POSITIVE — AB

## 2019-12-19 LAB — CBC
HCT: 35.2 % — ABNORMAL LOW (ref 36.0–46.0)
Hemoglobin: 10.9 g/dL — ABNORMAL LOW (ref 12.0–15.0)
MCH: 30 pg (ref 26.0–34.0)
MCHC: 31 g/dL (ref 30.0–36.0)
MCV: 97 fL (ref 80.0–100.0)
Platelets: 279 10*3/uL (ref 150–400)
RBC: 3.63 MIL/uL — ABNORMAL LOW (ref 3.87–5.11)
RDW: 12.1 % (ref 11.5–15.5)
WBC: 10.8 10*3/uL — ABNORMAL HIGH (ref 4.0–10.5)
nRBC: 0 % (ref 0.0–0.2)

## 2019-12-19 LAB — CBC WITH DIFFERENTIAL/PLATELET
Abs Immature Granulocytes: 0.07 10*3/uL (ref 0.00–0.07)
Basophils Absolute: 0 10*3/uL (ref 0.0–0.1)
Basophils Relative: 0 %
Eosinophils Absolute: 0 10*3/uL (ref 0.0–0.5)
Eosinophils Relative: 0 %
HCT: 31.2 % — ABNORMAL LOW (ref 36.0–46.0)
Hemoglobin: 10.1 g/dL — ABNORMAL LOW (ref 12.0–15.0)
Immature Granulocytes: 1 %
Lymphocytes Relative: 5 %
Lymphs Abs: 0.7 10*3/uL (ref 0.7–4.0)
MCH: 30 pg (ref 26.0–34.0)
MCHC: 32.4 g/dL (ref 30.0–36.0)
MCV: 92.6 fL (ref 80.0–100.0)
Monocytes Absolute: 0.7 10*3/uL (ref 0.1–1.0)
Monocytes Relative: 5 %
Neutro Abs: 13.7 10*3/uL — ABNORMAL HIGH (ref 1.7–7.7)
Neutrophils Relative %: 89 %
Platelets: 239 10*3/uL (ref 150–400)
RBC: 3.37 MIL/uL — ABNORMAL LOW (ref 3.87–5.11)
RDW: 12.3 % (ref 11.5–15.5)
WBC: 15.2 10*3/uL — ABNORMAL HIGH (ref 4.0–10.5)
nRBC: 0 % (ref 0.0–0.2)

## 2019-12-19 LAB — GLUCOSE, CAPILLARY: Glucose-Capillary: 113 mg/dL — ABNORMAL HIGH (ref 70–99)

## 2019-12-19 LAB — HIV ANTIBODY (ROUTINE TESTING W REFLEX): HIV Screen 4th Generation wRfx: NONREACTIVE

## 2019-12-19 LAB — HEMOGLOBIN A1C
Hgb A1c MFr Bld: 5 % (ref 4.8–5.6)
Mean Plasma Glucose: 96.8 mg/dL

## 2019-12-19 LAB — CK: Total CK: 370 U/L — ABNORMAL HIGH (ref 38–234)

## 2019-12-19 LAB — CORTISOL: Cortisol, Plasma: 23.5 ug/dL

## 2019-12-19 LAB — RESPIRATORY PANEL BY RT PCR (FLU A&B, COVID)
Influenza A by PCR: NEGATIVE
Influenza B by PCR: NEGATIVE
SARS Coronavirus 2 by RT PCR: NEGATIVE

## 2019-12-19 LAB — SODIUM, URINE, RANDOM: Sodium, Ur: 132 mmol/L

## 2019-12-19 LAB — TSH: TSH: 2.352 u[IU]/mL (ref 0.350–4.500)

## 2019-12-19 LAB — LIPASE, BLOOD: Lipase: 25 U/L (ref 11–51)

## 2019-12-19 LAB — LACTATE DEHYDROGENASE: LDH: 166 U/L (ref 98–192)

## 2019-12-19 LAB — CREATININE, URINE, RANDOM: Creatinine, Urine: 90.49 mg/dL

## 2019-12-19 LAB — MAGNESIUM: Magnesium: 1.4 mg/dL — ABNORMAL LOW (ref 1.7–2.4)

## 2019-12-19 LAB — MRSA PCR SCREENING: MRSA by PCR: NEGATIVE

## 2019-12-19 LAB — PHOSPHORUS: Phosphorus: 1.9 mg/dL — ABNORMAL LOW (ref 2.5–4.6)

## 2019-12-19 MED ORDER — LACTATED RINGERS IV BOLUS
1500.0000 mL | Freq: Once | INTRAVENOUS | Status: AC
  Administered 2019-12-19: 1000 mL via INTRAVENOUS

## 2019-12-19 MED ORDER — POTASSIUM & SODIUM PHOSPHATES 280-160-250 MG PO PACK
1.0000 | PACK | Freq: Once | ORAL | Status: DC
  Filled 2019-12-19: qty 1

## 2019-12-19 MED ORDER — SODIUM CHLORIDE 0.9 % IV BOLUS
1000.0000 mL | Freq: Once | INTRAVENOUS | Status: AC
  Administered 2019-12-19: 1000 mL via INTRAVENOUS

## 2019-12-19 MED ORDER — ACETAMINOPHEN 325 MG PO TABS
650.0000 mg | ORAL_TABLET | ORAL | Status: DC | PRN
  Administered 2019-12-19 – 2019-12-30 (×9): 650 mg via ORAL
  Filled 2019-12-19 (×11): qty 2

## 2019-12-19 MED ORDER — SODIUM PHOSPHATES 45 MMOLE/15ML IV SOLN
20.0000 mmol | Freq: Once | INTRAVENOUS | Status: AC
  Administered 2019-12-19: 20 mmol via INTRAVENOUS
  Filled 2019-12-19: qty 6.67

## 2019-12-19 MED ORDER — MAGNESIUM SULFATE 2 GM/50ML IV SOLN
2.0000 g | Freq: Once | INTRAVENOUS | Status: AC
  Administered 2019-12-19: 03:00:00 2 g via INTRAVENOUS
  Filled 2019-12-19: qty 50

## 2019-12-19 MED ORDER — POTASSIUM CHLORIDE 10 MEQ/100ML IV SOLN
10.0000 meq | INTRAVENOUS | Status: DC
  Filled 2019-12-19: qty 100

## 2019-12-19 MED ORDER — SODIUM CHLORIDE 0.9 % IV BOLUS
1000.0000 mL | Freq: Once | INTRAVENOUS | Status: AC
  Administered 2019-12-18: 1000 mL via INTRAVENOUS

## 2019-12-19 MED ORDER — SODIUM CHLORIDE 0.9 % IV BOLUS
1000.0000 mL | Freq: Once | INTRAVENOUS | Status: AC
  Administered 2019-12-19: 02:00:00 1000 mL via INTRAVENOUS

## 2019-12-19 MED ORDER — NAPROXEN 250 MG PO TABS
250.0000 mg | ORAL_TABLET | Freq: Two times a day (BID) | ORAL | Status: DC | PRN
  Administered 2019-12-19 – 2019-12-21 (×3): 250 mg via ORAL
  Filled 2019-12-19 (×5): qty 1

## 2019-12-19 MED ORDER — CHLORHEXIDINE GLUCONATE CLOTH 2 % EX PADS
6.0000 | MEDICATED_PAD | Freq: Every day | CUTANEOUS | Status: DC
  Administered 2019-12-19 – 2020-01-01 (×10): 6 via TOPICAL

## 2019-12-19 MED ORDER — LACTATED RINGERS IV SOLN: INTRAVENOUS | Status: AC

## 2019-12-19 MED ORDER — MAGNESIUM SULFATE 4 GM/100ML IV SOLN
4.0000 g | Freq: Once | INTRAVENOUS | Status: AC
  Administered 2019-12-19: 4 g via INTRAVENOUS
  Filled 2019-12-19 (×2): qty 100

## 2019-12-19 MED ORDER — ENOXAPARIN SODIUM 40 MG/0.4ML ~~LOC~~ SOLN
40.0000 mg | Freq: Every day | SUBCUTANEOUS | Status: DC
  Administered 2019-12-20: 40 mg via SUBCUTANEOUS
  Filled 2019-12-19: qty 0.4

## 2019-12-19 NOTE — Progress Notes (Signed)
PHARMACY - PHYSICIAN COMMUNICATION CRITICAL VALUE ALERT - BLOOD CULTURE IDENTIFICATION (BCID)  Anna Campos is an 54 y.o. female who presented to [facility] on 12/18/2019 with a chief complaint of syncope and hypotension.  Assessment:  BCx growing GPC in chains anaerobic 1/4. WBC increasing from 7.9 to 15.2, LA 1.8. Afebrile.    Name of physician (or Provider) Contacted: Dr. Violet Baldy  Current antibiotics: No antibiotics currently.   Changes to prescribed antibiotics recommended:  Will continue to monitor and add antibiotics pending cx results.  No results found for this or any previous visit.  Sherron Monday, PharmD, BCCCP Clinical Pharmacist  Phone: [phone]  Please check AMION for all [facility] phone numbers After 10:00 PM, call Main Pharmacy [phone] 12/19/2019  10:21 PM

## 2019-12-19 NOTE — Progress Notes (Signed)
Progress Note  Patient Name: Anna Campos Date of Encounter: 12/19/2019  Primary Cardiologist: No primary care provider on file.   Subjective   C/o body aches and chest pain.  Inpatient Medications    Scheduled Meds: . [START ON 12/20/2019] enoxaparin (LOVENOX) injection  40 mg Subcutaneous Daily  . potassium & sodium phosphates  1 packet Oral Once   Continuous Infusions: . lactated ringers Stopped (12/19/19 0556)  . magnesium sulfate bolus IVPB    . sodium phosphate  Dextrose 5% IVPB 20 mmol (12/19/19 1013)   PRN Meds: acetaminophen   Vital Signs    Vitals:   12/19/19 0622 12/19/19 0700 12/19/19 0721 12/19/19 0800  BP:  (!) 96/55  (!) 90/50  Pulse:  66  67  Resp:  15  19  Temp: 98.7 F (37.1 C)  (!) 97.4 F (36.3 C)   TempSrc: Oral  Oral   SpO2:  99%  99%  Weight: 59.6 kg     Height:        Intake/Output Summary (Last 24 hours) at 12/19/2019 1013 Last data filed at 12/19/2019 0800 Gross per 24 hour  Intake 483.89 ml  Output --  Net 483.89 ml   Filed Weights   12/18/19 2306 12/19/19 0622  Weight: 59 kg 59.6 kg    Telemetry    nsr - Personally Reviewed  ECG    nsr with NSSTT changes - Personally Reviewed  Physical Exam   GEN: well appearing, no acute distress.   Neck: 6 cm JVD Cardiac: RRR, no murmurs, rubs, or gallops.  Respiratory: Clear to auscultation bilaterally. GI: Soft, nontender, non-distended  MS: No edema; No deformity. Neuro:  Nonfocal  Psych: Normal affect   Labs    Chemistry Recent Labs  Lab 12/18/19 2230 12/18/19 2241 12/19/19 0155  NA 139 141 140  K 3.3* 3.3* 3.7  CL 111 106 114*  CO2 22  --  19*  GLUCOSE 130* 124* 124*  BUN 7 6 5*  CREATININE 1.19* 1.10* 0.77  CALCIUM 8.2*  --  7.8*  PROT 5.6*  --   --   ALBUMIN 3.1*  --   --   AST 19  --   --   ALT 11  --   --   ALKPHOS 130*  --   --   BILITOT 0.6  --   --   GFRNONAA 52*  --  >60  GFRAA >60  --  >60  ANIONGAP 6  --  7     Hematology Recent Labs  Lab  12/18/19 2230 12/18/19 2241 12/19/19 0155  WBC 7.9  --  10.8*  RBC 3.61*  --  3.63*  HGB 10.7* 10.5* 10.9*  HCT 34.0* 31.0* 35.2*  MCV 94.2  --  97.0  MCH 29.6  --  30.0  MCHC 31.5  --  31.0  RDW 11.9  --  12.1  PLT 260  --  279    Cardiac EnzymesNo results for input(s): TROPONINI in the last 168 hours. No results for input(s): TROPIPOC in the last 168 hours.   BNPNo results for input(s): BNP, PROBNP in the last 168 hours.   DDimer No results for input(s): DDIMER in the last 168 hours.   Radiology    CT Head Wo Contrast  Result Date: 12/18/2019 CLINICAL DATA:  Syncope, somnolent EXAM: CT HEAD WITHOUT CONTRAST TECHNIQUE: Contiguous axial images were obtained from the base of the skull through the vertex without intravenous contrast. COMPARISON:  None. FINDINGS: Brain:  No acute infarct or hemorrhage. Lateral ventricles and midline structures are unremarkable. No acute extra-axial fluid collections. No mass effect. Vascular: No hyperdense vessel or unexpected calcification. Skull: Normal. Negative for fracture or focal lesion. Sinuses/Orbits: No acute finding. Other: None IMPRESSION: 1. No acute intracranial process. Electronically Signed   By: Randa Ngo M.D.   On: 12/18/2019 23:22   CT Chest W Contrast  Result Date: 12/18/2019 CLINICAL DATA:  Chest trauma. EXAM: CT CHEST, ABDOMEN, AND PELVIS WITH CONTRAST TECHNIQUE: Multidetector CT imaging of the chest, abdomen and pelvis was performed following the standard protocol during bolus administration of intravenous contrast. CONTRAST:  14mL OMNIPAQUE IOHEXOL 350 MG/ML SOLN COMPARISON:  None. FINDINGS: CT CHEST FINDINGS Cardiovascular: There is no large centrally located pulmonary embolism. No evidence for a thoracic aortic aneurysm or dissection. The visualized arch vessels are grossly patent. Minimal atherosclerotic changes are noted of the thoracic aorta. Mediastinum/Nodes: --No mediastinal or hilar lymphadenopathy. --No axillary  lymphadenopathy. --No supraclavicular lymphadenopathy. --Normal thyroid gland. --The esophagus is unremarkable Lungs/Pleura: No pulmonary nodules or masses. No pleural effusion or pneumothorax. No focal airspace consolidation. No focal pleural abnormality. Musculoskeletal: No chest wall abnormality. No acute or significant osseous findings. CT ABDOMEN PELVIS FINDINGS Hepatobiliary: The liver is normal. Normal gallbladder.There is no biliary ductal dilation. Pancreas: Normal contours without ductal dilatation. No peripancreatic fluid collection. Spleen: No splenic laceration or hematoma. Adrenals/Urinary Tract: --Adrenal glands: No adrenal hemorrhage. --Right kidney/ureter: No hydronephrosis or perinephric hematoma. --Left kidney/ureter: No hydronephrosis or perinephric hematoma. --Urinary bladder: Unremarkable. Stomach/Bowel: --Stomach/Duodenum: No hiatal hernia or other gastric abnormality. Normal duodenal course and caliber. --Small bowel: No dilatation or inflammation. --Colon: No focal abnormality. --Appendix: Normal. Vascular/Lymphatic: Normal course and caliber of the major abdominal vessels. --No retroperitoneal lymphadenopathy. --No mesenteric lymphadenopathy. --No pelvic or inguinal lymphadenopathy. Reproductive: Unremarkable Other: No ascites or free air. The abdominal wall is normal. Musculoskeletal. No acute displaced fractures. IMPRESSION: 1. No acute finding of the chest, abdomen or pelvis. 2. Minimal atherosclerotic changes of the thoracic aorta. Electronically Signed   By: Constance Holster M.D.   On: 12/18/2019 23:25   CT Cervical Spine Wo Contrast  Result Date: 12/18/2019 CLINICAL DATA:  Syncope, somnolent EXAM: CT CERVICAL SPINE WITHOUT CONTRAST TECHNIQUE: Multidetector CT imaging of the cervical spine was performed without intravenous contrast. Multiplanar CT image reconstructions were also generated. COMPARISON:  None. FINDINGS: Alignment: Alignment is anatomic. Skull base and vertebrae: No  acute displaced fractures. Soft tissues and spinal canal: No prevertebral fluid or swelling. No visible canal hematoma. Disc levels: There is extensive multilevel cervical spondylosis with disc space narrowing and circumferential osteophyte formation seen at C3-4, C4-5, C5-6, and C6-7. There is symmetrical neural foraminal encroachment at these levels. Upper chest: Airway is patent.  Lung apices are clear. Other: Reconstructed images demonstrate no additional findings. IMPRESSION: 1. Extensive multilevel cervical spondylosis.  No acute fracture. Electronically Signed   By: Randa Ngo M.D.   On: 12/18/2019 23:23   CT ABDOMEN PELVIS W CONTRAST  Result Date: 12/18/2019 CLINICAL DATA:  Chest trauma. EXAM: CT CHEST, ABDOMEN, AND PELVIS WITH CONTRAST TECHNIQUE: Multidetector CT imaging of the chest, abdomen and pelvis was performed following the standard protocol during bolus administration of intravenous contrast. CONTRAST:  142mL OMNIPAQUE IOHEXOL 350 MG/ML SOLN COMPARISON:  None. FINDINGS: CT CHEST FINDINGS Cardiovascular: There is no large centrally located pulmonary embolism. No evidence for a thoracic aortic aneurysm or dissection. The visualized arch vessels are grossly patent. Minimal atherosclerotic changes are noted of the thoracic aorta.  Mediastinum/Nodes: --No mediastinal or hilar lymphadenopathy. --No axillary lymphadenopathy. --No supraclavicular lymphadenopathy. --Normal thyroid gland. --The esophagus is unremarkable Lungs/Pleura: No pulmonary nodules or masses. No pleural effusion or pneumothorax. No focal airspace consolidation. No focal pleural abnormality. Musculoskeletal: No chest wall abnormality. No acute or significant osseous findings. CT ABDOMEN PELVIS FINDINGS Hepatobiliary: The liver is normal. Normal gallbladder.There is no biliary ductal dilation. Pancreas: Normal contours without ductal dilatation. No peripancreatic fluid collection. Spleen: No splenic laceration or hematoma.  Adrenals/Urinary Tract: --Adrenal glands: No adrenal hemorrhage. --Right kidney/ureter: No hydronephrosis or perinephric hematoma. --Left kidney/ureter: No hydronephrosis or perinephric hematoma. --Urinary bladder: Unremarkable. Stomach/Bowel: --Stomach/Duodenum: No hiatal hernia or other gastric abnormality. Normal duodenal course and caliber. --Small bowel: No dilatation or inflammation. --Colon: No focal abnormality. --Appendix: Normal. Vascular/Lymphatic: Normal course and caliber of the major abdominal vessels. --No retroperitoneal lymphadenopathy. --No mesenteric lymphadenopathy. --No pelvic or inguinal lymphadenopathy. Reproductive: Unremarkable Other: No ascites or free air. The abdominal wall is normal. Musculoskeletal. No acute displaced fractures. IMPRESSION: 1. No acute finding of the chest, abdomen or pelvis. 2. Minimal atherosclerotic changes of the thoracic aorta. Electronically Signed   By: Katherine Mantle M.D.   On: 12/18/2019 23:25   DG Pelvis Portable  Result Date: 12/18/2019 CLINICAL DATA:  Syncope, fell EXAM: PORTABLE PELVIS 1-2 VIEWS COMPARISON:  None. FINDINGS: Supine frontal view of the pelvis was performed. There are no acute displaced fractures. The hips are well aligned. Joint spaces are well preserved. Sacroiliac joints are normal. IMPRESSION: 1. No acute displaced fracture. Electronically Signed   By: Sharlet Salina M.D.   On: 12/18/2019 22:47   DG Chest Portable 1 View  Result Date: 12/18/2019 CLINICAL DATA:  Syncope, fell EXAM: PORTABLE CHEST 1 VIEW COMPARISON:  None. FINDINGS: The heart size and mediastinal contours are within normal limits. Both lungs are clear. The visualized skeletal structures are unremarkable. IMPRESSION: No active disease. Electronically Signed   By: Sharlet Salina M.D.   On: 12/18/2019 22:47   CT Maxillofacial Wo Contrast  Result Date: 12/18/2019 CLINICAL DATA:  Facial trauma, syncope, somnolent EXAM: CT MAXILLOFACIAL WITHOUT CONTRAST TECHNIQUE:  Multidetector CT imaging of the maxillofacial structures was performed. Multiplanar CT image reconstructions were also generated. COMPARISON:  None. FINDINGS: Osseous: No fracture or mandibular dislocation. No destructive process. Orbits: Negative. No traumatic or inflammatory finding. Sinuses: Clear. Soft tissues: Negative. Limited intracranial: No significant or unexpected finding. IMPRESSION: 1. No acute facial bone fracture. Electronically Signed   By: Sharlet Salina M.D.   On: 12/18/2019 23:26    Cardiac Studies   2d echo is pending  Patient Profile     54 y.o. female admitted with syncope, chest pain and hypotesion.   Assessment & Plan    1. Chest pain - her ECG shows non-specific findings and her pre-test probability for CAD is low. I would not work up further at this point unless echo is abnormal. 2. Hypotension - agree with pulm/ccm that hypotension likely due to dehydration. However, the findings of hypotension, chest pain and NSSTT changes on ECG should at least raise a question about PE. Note contrast (not spiral CT) was negative for large PE. Consider D-dimer and if elevated, I would consider spiral CT. 3. Poor dentition - she has several teeth that are loose. Would ask our dentist to see as her dental carries pose a risk for infection.     For questions or updates, please contact [facility] Please consult [email or web address] for contact info under Cardiology/STEMI.     Signed,  Lewayne Bunting, MD  12/19/2019, 10:13 AM  Patient ID: [name], female   DOB: [birth year], 54 y.o.   MRN: [id number]

## 2019-12-19 NOTE — Consult Note (Signed)
Cardiology Consultation:   Patient ID: Anna Campos MRN: [id number]; DOB: 07/11/1966  Admit date: 12/18/2019 Date of Consult: 12/19/2019  Primary Care Provider: Farris Has, MD Primary Cardiologist: No primary care provider on file.  Primary Electrophysiologist:  None    Patient Profile:   Anna Campos is a 54 y.o. female with no significant PMH who is being seen today for the evaluation of syncope and hypotension at the request of Dr. Rush Landmark.  History of Present Illness:   Anna Campos was a bit more fatigued today and spent most of the day in bed. She got up to make dinner this evening and then passed out at home. EMS was called and she was noted to be hypotensive in the field and was give a small fluid bolus. On arrival to the ER, she was panscanned with no evidence of fractures, PE or other trauma. She continues to be hypotensive. She appears fatigued and is slow to answer questions. She notes some discomfort in her chest but also feels pain all over. She denies any shortness of breath. Of note, she does endorse marijuana use today.  Heart Pathway Score:     History reviewed. No pertinent past medical history.  No past surgical history on file.   Home Medications:  Prior to Admission medications   Not on File    Inpatient Medications: Scheduled Meds:  Continuous Infusions: . norepinephrine (LEVOPHED) Adult infusion 8 mcg/min (12/19/19 0004)   PRN Meds:   Allergies:    Allergies  Allergen Reactions  . Ampicillin     Social History:   Social History   Socioeconomic History  . Marital status: Married    Spouse name: Not on file  . Number of children: Not on file  . Years of education: Not on file  . Highest education level: Not on file  Occupational History  . Not on file  Tobacco Use  . Smoking status: Not on file  Substance and Sexual Activity  . Alcohol use: Not on file  . Drug use: Not on file  . Sexual activity: Not on file  Other Topics Concern  .  Not on file  Social History Narrative  . Not on file   Social Determinants of Health   Financial Resource Strain:   . Difficulty of Paying Living Expenses: Not on file  Food Insecurity:   . Worried About Programme researcher, broadcasting/film/video in the Last Year: Not on file  . Ran Out of Food in the Last Year: Not on file  Transportation Needs:   . Lack of Transportation (Medical): Not on file  . Lack of Transportation (Non-Medical): Not on file  Physical Activity:   . Days of Exercise per Week: Not on file  . Minutes of Exercise per Session: Not on file  Stress:   . Feeling of Stress : Not on file  Social Connections:   . Frequency of Communication with Friends and Family: Not on file  . Frequency of Social Gatherings with Friends and Family: Not on file  . Attends Religious Services: Not on file  . Active Member of Clubs or Organizations: Not on file  . Attends Banker Meetings: Not on file  . Marital Status: Not on file  Intimate Partner Violence:   . Fear of Current or Ex-Partner: Not on file  . Emotionally Abused: Not on file  . Physically Abused: Not on file  . Sexually Abused: Not on file    Family History:    Family History  Problem Relation Age of Onset  . Breast cancer Mother    Thyroid problems Niece MI?   ROS:  Please see the history of present illness.  ROS difficult to obtain due to patient's fatigue and difficulty responding  Physical Exam/Data:   Vitals:   12/18/19 2356 12/18/19 2358 12/19/19 0000 12/19/19 0003  BP:  (!) 77/45 (!) 82/51 117/66  Pulse: 88 87 87 80  Resp: 15 15 14 17   Temp:      TempSrc:      SpO2: 100% 100% 100% 100%  Weight:      Height:        Intake/Output Summary (Last 24 hours) at 12/19/2019 0006 Last data filed at 12/18/2019 2227 Gross per 24 hour  Intake 250 ml  Output --  Net 250 ml   Last 3 Weights 12/18/2019  Weight (lbs) 130 lb  Weight (kg) 58.968 kg     Body mass index is 20.36 kg/m.  General:  Lying comfortably  in bed, appears fatigued HEENT: normal Lymph: no adenopathy Neck: no JVD Vascular: No carotid bruits; FA pulses 2+ bilaterally without bruits  Cardiac:  normal S1, S2; RRR; no murmurs, rubs or gallops Lungs:  clear to auscultation bilaterally, no wheezing, rhonchi or rales  Abd: soft, nontender, no hepatomegaly  Ext: no edema Musculoskeletal:  No deformities, BUE and BLE strength normal and equal Skin: warm and dry  Psych:  Groggy  EKG:  The EKG was personally reviewed and demonstrates: Sinus rhythm with repolarization changes in the inferolateral leads Telemetry:  Telemetry was personally reviewed and demonstrates:  Sinus rhythm  Relevant CV Studies: None  Laboratory Data:  High Sensitivity Troponin:   Recent Labs  Lab 12/18/19 2230  TROPONINIHS <2     Chemistry Recent Labs  Lab 12/18/19 2230 12/18/19 2241  NA 139 141  K 3.3* 3.3*  CL 111 106  CO2 22  --   GLUCOSE 130* 124*  BUN 7 6  CREATININE 1.19* 1.10*  CALCIUM 8.2*  --   GFRNONAA 52*  --   GFRAA >60  --   ANIONGAP 6  --     Recent Labs  Lab 12/18/19 2230  PROT 5.6*  ALBUMIN 3.1*  AST 19  ALT 11  ALKPHOS 130*  BILITOT 0.6   Hematology Recent Labs  Lab 12/18/19 2230 12/18/19 2241  WBC 7.9  --   RBC 3.61*  --   HGB 10.7* 10.5*  HCT 34.0* 31.0*  MCV 94.2  --   MCH 29.6  --   MCHC 31.5  --   RDW 11.9  --   PLT 260  --    BNPNo results for input(s): BNP, PROBNP in the last 168 hours.  DDimer No results for input(s): DDIMER in the last 168 hours.   Radiology/Studies:  CT Head Wo Contrast  Result Date: 12/18/2019 CLINICAL DATA:  Syncope, somnolent EXAM: CT HEAD WITHOUT CONTRAST TECHNIQUE: Contiguous axial images were obtained from the base of the skull through the vertex without intravenous contrast. COMPARISON:  None. FINDINGS: Brain: No acute infarct or hemorrhage. Lateral ventricles and midline structures are unremarkable. No acute extra-axial fluid collections. No mass effect. Vascular: No  hyperdense vessel or unexpected calcification. Skull: Normal. Negative for fracture or focal lesion. Sinuses/Orbits: No acute finding. Other: None IMPRESSION: 1. No acute intracranial process. Electronically Signed   By: 12/20/2019 M.D.   On: 12/18/2019 23:22   CT Chest W Contrast  Result Date: 12/18/2019 CLINICAL DATA:  Chest trauma. EXAM:  CT CHEST, ABDOMEN, AND PELVIS WITH CONTRAST TECHNIQUE: Multidetector CT imaging of the chest, abdomen and pelvis was performed following the standard protocol during bolus administration of intravenous contrast. CONTRAST:  OMNIPAQUE IOHEXOL 350 MG/ML SOLN COMPARISON:  None. FINDINGS: CT CHEST FINDINGS Cardiovascular: There is no large centrally located pulmonary embolism. No evidence for a thoracic aortic aneurysm or dissection. The visualized arch vessels are grossly patent. Minimal atherosclerotic changes are noted of the thoracic aorta. Mediastinum/Nodes: --No mediastinal or hilar lymphadenopathy. --No axillary lymphadenopathy. --No supraclavicular lymphadenopathy. --Normal thyroid gland. --The esophagus is unremarkable Lungs/Pleura: No pulmonary nodules or masses. No pleural effusion or pneumothorax. No focal airspace consolidation. No focal pleural abnormality. Musculoskeletal: No chest wall abnormality. No acute or significant osseous findings. CT ABDOMEN PELVIS FINDINGS Hepatobiliary: The liver is normal. Normal gallbladder.There is no biliary ductal dilation. Pancreas: Normal contours without ductal dilatation. No peripancreatic fluid collection. Spleen: No splenic laceration or hematoma. Adrenals/Urinary Tract: --Adrenal glands: No adrenal hemorrhage. --Right kidney/ureter: No hydronephrosis or perinephric hematoma. --Left kidney/ureter: No hydronephrosis or perinephric hematoma. --Urinary bladder: Unremarkable. Stomach/Bowel: --Stomach/Duodenum: No hiatal hernia or other gastric abnormality. Normal duodenal course and caliber. --Small bowel: No dilatation  or inflammation. --Colon: No focal abnormality. --Appendix: Normal. Vascular/Lymphatic: Normal course and caliber of the major abdominal vessels. --No retroperitoneal lymphadenopathy. --No mesenteric lymphadenopathy. --No pelvic or inguinal lymphadenopathy. Reproductive: Unremarkable Other: No ascites or free air. The abdominal wall is normal. Musculoskeletal. No acute displaced fractures. IMPRESSION: 1. No acute finding of the chest, abdomen or pelvis. 2. Minimal atherosclerotic changes of the thoracic aorta. Electronically Signed   By: Katherine Mantle M.D.   On: 12/18/2019 23:25   CT Cervical Spine Wo Contrast  Result Date: 12/18/2019 CLINICAL DATA:  Syncope, somnolent EXAM: CT CERVICAL SPINE WITHOUT CONTRAST TECHNIQUE: Multidetector CT imaging of the cervical spine was performed without intravenous contrast. Multiplanar CT image reconstructions were also generated. COMPARISON:  None. FINDINGS: Alignment: Alignment is anatomic. Skull base and vertebrae: No acute displaced fractures. Soft tissues and spinal canal: No prevertebral fluid or swelling. No visible canal hematoma. Disc levels: There is extensive multilevel cervical spondylosis with disc space narrowing and circumferential osteophyte formation seen at C3-4, C4-5, C5-6, and C6-7. There is symmetrical neural foraminal encroachment at these levels. Upper chest: Airway is patent.  Lung apices are clear. Other: Reconstructed images demonstrate no additional findings. IMPRESSION: 1. Extensive multilevel cervical spondylosis.  No acute fracture. Electronically Signed   By: Sharlet Salina M.D.   On: 12/18/2019 23:23   CT ABDOMEN PELVIS W CONTRAST  Result Date: 12/18/2019 CLINICAL DATA:  Chest trauma. EXAM: CT CHEST, ABDOMEN, AND PELVIS WITH CONTRAST TECHNIQUE: Multidetector CT imaging of the chest, abdomen and pelvis was performed following the standard protocol during bolus administration of intravenous contrast. CONTRAST:  OMNIPAQUE IOHEXOL 350  MG/ML SOLN COMPARISON:  None. FINDINGS: CT CHEST FINDINGS Cardiovascular: There is no large centrally located pulmonary embolism. No evidence for a thoracic aortic aneurysm or dissection. The visualized arch vessels are grossly patent. Minimal atherosclerotic changes are noted of the thoracic aorta. Mediastinum/Nodes: --No mediastinal or hilar lymphadenopathy. --No axillary lymphadenopathy. --No supraclavicular lymphadenopathy. --Normal thyroid gland. --The esophagus is unremarkable Lungs/Pleura: No pulmonary nodules or masses. No pleural effusion or pneumothorax. No focal airspace consolidation. No focal pleural abnormality. Musculoskeletal: No chest wall abnormality. No acute or significant osseous findings. CT ABDOMEN PELVIS FINDINGS Hepatobiliary: The liver is normal. Normal gallbladder.There is no biliary ductal dilation. Pancreas: Normal contours without ductal dilatation. No peripancreatic fluid collection. Spleen: No  splenic laceration or hematoma. Adrenals/Urinary Tract: --Adrenal glands: No adrenal hemorrhage. --Right kidney/ureter: No hydronephrosis or perinephric hematoma. --Left kidney/ureter: No hydronephrosis or perinephric hematoma. --Urinary bladder: Unremarkable. Stomach/Bowel: --Stomach/Duodenum: No hiatal hernia or other gastric abnormality. Normal duodenal course and caliber. --Small bowel: No dilatation or inflammation. --Colon: No focal abnormality. --Appendix: Normal. Vascular/Lymphatic: Normal course and caliber of the major abdominal vessels. --No retroperitoneal lymphadenopathy. --No mesenteric lymphadenopathy. --No pelvic or inguinal lymphadenopathy. Reproductive: Unremarkable Other: No ascites or free air. The abdominal wall is normal. Musculoskeletal. No acute displaced fractures. IMPRESSION: 1. No acute finding of the chest, abdomen or pelvis. 2. Minimal atherosclerotic changes of the thoracic aorta. Electronically Signed   By: Constance Holster M.D.   On: 12/18/2019 23:25   DG  Pelvis Portable  Result Date: 12/18/2019 CLINICAL DATA:  Syncope, fell EXAM: PORTABLE PELVIS 1-2 VIEWS COMPARISON:  None. FINDINGS: Supine frontal view of the pelvis was performed. There are no acute displaced fractures. The hips are well aligned. Joint spaces are well preserved. Sacroiliac joints are normal. IMPRESSION: 1. No acute displaced fracture. Electronically Signed   By: Randa Ngo M.D.   On: 12/18/2019 22:47   DG Chest Portable 1 View  Result Date: 12/18/2019 CLINICAL DATA:  Syncope, fell EXAM: PORTABLE CHEST 1 VIEW COMPARISON:  None. FINDINGS: The heart size and mediastinal contours are within normal limits. Both lungs are clear. The visualized skeletal structures are unremarkable. IMPRESSION: No active disease. Electronically Signed   By: Randa Ngo M.D.   On: 12/18/2019 22:47   CT Maxillofacial Wo Contrast  Result Date: 12/18/2019 CLINICAL DATA:  Facial trauma, syncope, somnolent EXAM: CT MAXILLOFACIAL WITHOUT CONTRAST TECHNIQUE: Multidetector CT imaging of the maxillofacial structures was performed. Multiplanar CT image reconstructions were also generated. COMPARISON:  None. FINDINGS: Osseous: No fracture or mandibular dislocation. No destructive process. Orbits: Negative. No traumatic or inflammatory finding. Sinuses: Clear. Soft tissues: Negative. Limited intracranial: No significant or unexpected finding. IMPRESSION: 1. No acute facial bone fracture. Electronically Signed   By: Randa Ngo M.D.   On: 12/18/2019 23:26   { HEAR Score (for undifferentiated chest pain):     2 points  Assessment and Plan:   Anna Campos is a 53y/o with no medical history here with syncope and persistent hypotension. Cardiology is consulted due to repolarization abnormalities on ECG. Her cardiovascular exam today is benign with no murmurs and a non-displaced PMI. Initial troponin is undetectable and her story is not consistent with an ACS event. Given her benign exam, other cardiac causes of  hypotension such as HCM are unlikely. There is nothing on exam or her history to suggest a cardiac etiology for her hypotension.   For questions or updates, please contact [place] Please consult [email or web address] for contact info under     Signed, Princella Pellegrini, MD  12/19/2019 12:06 AM

## 2019-12-19 NOTE — ED Notes (Signed)
Pt stated she felt like she was loosing sensation and ability to move legs. Performed ROM for pt and pt seemed stiff. MD informed

## 2019-12-19 NOTE — Progress Notes (Signed)
   Echo with normal LVF, normal RV size and function. No significant valvular abnormalities. No signs of right heart strain. No need for further cardiac workup.  We will sign off. See Dr. Lubertha Basque prior note.  Please call if needed.   Berton Bon, AGNP-C [facility] 12/19/2019  1:39 PM

## 2019-12-19 NOTE — Progress Notes (Signed)
  Echocardiogram 2D Echocardiogram has been performed.  Delcie Roch 12/19/2019, 10:04 AM

## 2019-12-19 NOTE — Progress Notes (Signed)
Seen and examined Appears profoundly dehydrated More crystalloid ordered Start clear liquid diet Obtain urine studies including UDS Wean levophed.

## 2019-12-19 NOTE — H&P (Signed)
NAME:  Anna Campos, MRN:  [id number], DOB:  July 01, 1966, LOS: 0 ADMISSION DATE:  12/18/2019, CONSULTATION DATE:  12/19/2019 REFERRING MD:  ED, CHIEF COMPLAINT:  Syncope  Brief History   54 year old woman with no significant past medical history presenting with syncope and hypotension.  She was started on levo gtt.  History of present illness   54 year old woman with no significant past medical history presenting with syncope.  She has been having generalized fatigue.  She has had poor p.o. intake and has not eaten in the last day.  She stood up this evening and passed out.  Family heard her fall.  She did hit her head.  She was told she was not unconscious for a long period of time.  She was noted by EMS to be hypotensive and she was given a small fluid bolus.  Presently she complains of feeling pain all over her body.  She admits to using marijuana today.  She does not take any other medications other than a multivitamin.  She lives with her husband.  She denies that he takes any medications either.  She does not think that she could've inadvertently taken any antihypertensives.  She did recently have tooth pain and had taken some Tylenol with codeine several days ago.  None since.  She denies fevers or chills.  No sick contacts.  No dyspnea.  She notes that she had a loose stool the day before and none since.  No nausea or vomiting.  She feels lightheaded and has a headache.  Does not have vertigo.  She has diffuse myalgias and paresthesias.  Past Medical History  Marijuana use  Significant Hospital Events   2/28 > admit  Consults:  Cardiology 2/28  Procedures:  None  Significant Diagnostic Tests:  CT Maxillofacial w/o 2/27: No acute facial bone fracture  CT chest/abdomen/pelvis w/ 2/27: No acute findings in the chest abdomen or pelvis, minimal atherosclerotic changes of the thoracic aorta  CT C-spine w/o 2/27: Extensive multilevel cervical spondylosis.  No acute fracture  CT head  w/o 2/27: No acute intracranial process  XR pelvis 2/27: No acute displaced fracture  Micro Data:  COVID 2/27 neg Influenza 2/27 neg Blood culture 2/27>  Antimicrobials:  None  Interim history/subjective:  Complaining of myalgias in her arms and legs  Objective   Blood pressure 96/70, pulse 87, temperature (!) 97.5 F (36.4 C), temperature source Oral, resp. rate 20, height 5\' 7"  (1.702 m), weight 59 kg, SpO2 99 %.        Intake/Output Summary (Last 24 hours) at 12/19/2019 0143 Last data filed at 12/18/2019 2227 Gross per 24 hour  Intake 250 ml  Output --  Net 250 ml   Filed Weights   12/18/19 2306  Weight: 59 kg    Examination: General: NAD HENT: Poor dentition Lungs: Chest wall tenderness, clear to auscultation without wheezes, breathing comfortably Cardiovascular: RRR Abdomen: Soft, tender to palpation, nondistended Extremities: No LE edema Neuro: Reports tenderness to palpation LE but does not withdraw, able to move fingers and toes, pain to movement. No tenderness with palpation along c-spine. Able to tuck chin to chest   Assessment & Plan:  54 year old woman with no significant past medical history presenting with syncope.   Syncope, hypovolemic shock, anemia of unknown chronicity: TSH normal. EKG with very mild ST downsloping on II, III, aVF, V3-5 on EKG with no previous EKG for comparison. Trop neg. Unlikely to be infection given the normal WBC. Possible that this is hypovolemia  given AKI, the poor PO intake over the past few days and some loose stools over a day ago. She has received approximately 3L NS.  --Appreciate evaluation by cardiology. --Tele monitoring --Repeat EKG in AM --Trend trops --TTE --Give additional 1L bolus --Titrate Levo gtt for goal MAP >65 --Follow up BCx --Follow up UA and UDS --Follow up A1c --Hgb 10.7 but no signs of active bleed. Monitor  Myalgias: Adrenal insufficiency unlikely. Cortisol level normal.  --Rhabdo unlikely  given her reported downtime. Will check CK --Will evaluate for other myositis with CK, aldolase, LDH --Could be related to her electrolyte abnormalities. Will replace her Mg and K.   AKI, hypoMg, hypoK: Suspect from hypovolemia. Unknown baseline creatinine --Monitor --Follow up urine sodium and urine creatinine --Replaced Mg and K  Best practice:  Diet: Sips with meds for now Pain/Anxiety/Delirium protocol (if indicated): N/A VAP protocol (if indicated): N/A DVT prophylaxis: subq Lovenox GI prophylaxis: N/A Glucose control: Monitor Mobility: PT when able Code Status: FULL Family Communication: Discussed with patient at bedside. She states her surrogate decision maker would be her husband Disposition: ICU  Labs   CBC: Recent Labs  Lab 12/18/19 2230 12/18/19 2241  WBC 7.9  --   HGB 10.7* 10.5*  HCT 34.0* 31.0*  MCV 94.2  --   PLT 260  --     Basic Metabolic Panel: Recent Labs  Lab 12/18/19 2230 12/18/19 2241  NA 139 141  K 3.3* 3.3*  CL 111 106  CO2 22  --   GLUCOSE 130* 124*  BUN 7 6  CREATININE 1.19* 1.10*  CALCIUM 8.2*  --   MG 1.6*  --    GFR: Estimated Creatinine Clearance: 55.7 mL/min (A) (by C-G formula based on SCr of 1.1 mg/dL (H)). Recent Labs  Lab 12/18/19 2230  WBC 7.9  LATICACIDVEN 1.8    Liver Function Tests: Recent Labs  Lab 12/18/19 2230  AST 19  ALT 11  ALKPHOS 130*  BILITOT 0.6  PROT 5.6*  ALBUMIN 3.1*   No results for input(s): LIPASE, AMYLASE in the last 168 hours. No results for input(s): AMMONIA in the last 168 hours.  ABG    Component Value Date/Time   TCO2 22 12/18/2019 2241     Coagulation Profile: Recent Labs  Lab 12/18/19 2230  INR 1.1    Cardiac Enzymes: No results for input(s): CKTOTAL, CKMB, CKMBINDEX, TROPONINI in the last 168 hours.  HbA1C: No results found for: HGBA1C  CBG: No results for input(s): GLUCAP in the last 168 hours.  Review of Systems:   12 point review of system negative except  per HPI  Past Medical History  She,  has no past medical history on file.   Surgical History   No past surgical history on file.   Social History      Family History   Her family history includes Breast cancer in her mother.   Allergies Allergies  Allergen Reactions  . Ampicillin      Home Medications  Multivitamin  Critical care time: The patient is critically ill with multiple organ systems failure and requires high complexity decision making for assessment and support, frequent evaluation and titration of therapies, application of advanced monitoring technologies and extensive interpretation of multiple databases.   Critical Care Time devoted to patient care services described in this note is  40 Minutes. This time reflects time of care of this signee. This critical care time does not reflect procedure time, or teaching time or supervisory  time of PA/NP/Med student/Med Resident etc but could involve care discussion time.  Griffin Basil, M.D. [facility] Pulmonary/Critical Care Medicine After hours pager: [phone].

## 2019-12-20 ENCOUNTER — Inpatient Hospital Stay (HOSPITAL_COMMUNITY): Payer: Commercial Managed Care - PPO

## 2019-12-20 DIAGNOSIS — I2699 Other pulmonary embolism without acute cor pulmonale: Secondary | ICD-10-CM

## 2019-12-20 DIAGNOSIS — R55 Syncope and collapse: Secondary | ICD-10-CM

## 2019-12-20 HISTORY — DX: Syncope and collapse: R55

## 2019-12-20 HISTORY — DX: Other pulmonary embolism without acute cor pulmonale: I26.99

## 2019-12-20 LAB — CBC
HCT: 28.3 % — ABNORMAL LOW (ref 36.0–46.0)
Hemoglobin: 9.3 g/dL — ABNORMAL LOW (ref 12.0–15.0)
MCH: 30.4 pg (ref 26.0–34.0)
MCHC: 32.9 g/dL (ref 30.0–36.0)
MCV: 92.5 fL (ref 80.0–100.0)
Platelets: 201 10*3/uL (ref 150–400)
RBC: 3.06 MIL/uL — ABNORMAL LOW (ref 3.87–5.11)
RDW: 12.3 % (ref 11.5–15.5)
WBC: 8.4 10*3/uL (ref 4.0–10.5)
nRBC: 0 % (ref 0.0–0.2)

## 2019-12-20 LAB — BASIC METABOLIC PANEL
Anion gap: 4 — ABNORMAL LOW (ref 5–15)
BUN: <5 mg/dL — ABNORMAL LOW (ref 6–20)
CO2: 21 mmol/L — ABNORMAL LOW (ref 22–32)
Calcium: 8 mg/dL — ABNORMAL LOW (ref 8.9–10.3)
Chloride: 115 mmol/L — ABNORMAL HIGH (ref 98–111)
Creatinine, Ser: 0.79 mg/dL (ref 0.44–1.00)
GFR calc Af Amer: >60 mL/min (ref >60)
GFR calc non Af Amer: >60 mL/min (ref >60)
Glucose, Bld: 91 mg/dL (ref 70–99)
Potassium: 3.7 mmol/L (ref 3.5–5.1)
Sodium: 140 mmol/L (ref 135–145)

## 2019-12-20 LAB — HEPARIN LEVEL (UNFRACTIONATED): Heparin Unfractionated: 0.43 IU/mL (ref 0.30–0.70)

## 2019-12-20 LAB — D-DIMER, QUANTITATIVE: D-Dimer, Quant: 1.74 ug/mL-FEU — ABNORMAL HIGH (ref 0.00–0.50)

## 2019-12-20 LAB — ALDOLASE: Aldolase: 8.1 U/L (ref 3.3–10.3)

## 2019-12-20 LAB — GLUCOSE, CAPILLARY: Glucose-Capillary: 92 mg/dL (ref 70–99)

## 2019-12-20 LAB — MAGNESIUM: Magnesium: 2.3 mg/dL (ref 1.7–2.4)

## 2019-12-20 MED ORDER — CEFAZOLIN SODIUM-DEXTROSE 2-4 GM/100ML-% IV SOLN
2.0000 g | Freq: Three times a day (TID) | INTRAVENOUS | Status: DC
  Filled 2019-12-20 (×3): qty 100

## 2019-12-20 MED ORDER — VANCOMYCIN HCL 750 MG/150ML IV SOLN
750.0000 mg | Freq: Two times a day (BID) | INTRAVENOUS | Status: DC
  Administered 2019-12-20 – 2019-12-21 (×2): 750 mg via INTRAVENOUS
  Filled 2019-12-20 (×2): qty 150

## 2019-12-20 MED ORDER — HEPARIN (PORCINE) 25000 UT/250ML-% IV SOLN
900.0000 [IU]/h | INTRAVENOUS | Status: AC
  Administered 2019-12-20: 1100 [IU]/h via INTRAVENOUS
  Administered 2019-12-23: 750 [IU]/h via INTRAVENOUS
  Administered 2019-12-24 – 2019-12-26 (×3): 850 [IU]/h via INTRAVENOUS
  Administered 2019-12-28: 1000 [IU]/h via INTRAVENOUS
  Administered 2019-12-29: 900 [IU]/h via INTRAVENOUS
  Filled 2019-12-20 (×8): qty 250

## 2019-12-20 MED ORDER — SODIUM CHLORIDE 0.9 % IV SOLN
INTRAVENOUS | Status: DC | PRN
  Administered 2019-12-20 – 2019-12-31 (×4): 250 mL via INTRAVENOUS

## 2019-12-20 MED ORDER — VANCOMYCIN HCL 1250 MG/250ML IV SOLN
1250.0000 mg | Freq: Once | INTRAVENOUS | Status: AC
  Administered 2019-12-20: 1250 mg via INTRAVENOUS
  Filled 2019-12-20: qty 250

## 2019-12-20 MED ORDER — HEPARIN BOLUS VIA INFUSION
2000.0000 [IU] | Freq: Once | INTRAVENOUS | Status: AC
  Administered 2019-12-20: 2000 [IU] via INTRAVENOUS
  Filled 2019-12-20: qty 2000

## 2019-12-20 MED ORDER — SODIUM CHLORIDE 0.9 % IV BOLUS
500.0000 mL | Freq: Once | INTRAVENOUS | Status: AC
  Administered 2019-12-20: 500 mL via INTRAVENOUS

## 2019-12-20 MED ORDER — VANCOMYCIN HCL 750 MG/150ML IV SOLN
750.0000 mg | Freq: Two times a day (BID) | INTRAVENOUS | Status: DC
  Administered 2019-12-20: 750 mg via INTRAVENOUS
  Filled 2019-12-20: qty 150

## 2019-12-20 MED ORDER — IOHEXOL 350 MG/ML SOLN
100.0000 mL | Freq: Once | INTRAVENOUS | Status: AC | PRN
  Administered 2019-12-20: 80 mL via INTRAVENOUS

## 2019-12-20 NOTE — Progress Notes (Signed)
ANTICOAGULATION CONSULT NOTE  Pharmacy Consult for heparin Indication: pulmonary embolus  Allergies  Allergen Reactions  . Ampicillin Diarrhea    Patient Measurements: Height: 5\' 9"  (175.3 cm) Weight: 140 lb 6.9 oz (63.7 kg) IBW/kg (Calculated) : 66.2 Heparin Dosing Weight: TBW  Vital Signs: Temp: 100 F (37.8 C) (03/01 2045) Temp Source: Oral (03/01 2045) BP: 92/61 (03/01 2045) Pulse Rate: 87 (03/01 2045)  Labs: Recent Labs    12/18/19 2230 12/18/19 2230 12/18/19 2241 12/19/19 0155 12/19/19 0155 12/19/19 1046 12/20/19 0325 12/20/19 1850  HGB 10.7*   < > 10.5* 10.9*   < > 10.1* 9.3*  --   HCT 34.0*   < > 31.0* 35.2*  --  31.2* 28.3*  --   PLT 260   < >  --  279  --  239 201  --   LABPROT 14.5  --   --   --   --   --   --   --   INR 1.1  --   --   --   --   --   --   --   HEPARINUNFRC  --   --   --   --   --   --   --  0.43  CREATININE 1.19*   < > 1.10* 0.77  --   --  0.79  --   CKTOTAL  --   --   --  370*  --   --   --   --   TROPONINIHS <2  --   --  14  --   --   --   --    < > = values in this interval not displayed.    Estimated Creatinine Clearance: 82.7 mL/min (by C-G formula based on SCr of 0.79 mg/dL).   Medical History: History reviewed. No pertinent past medical history.   Assessment: 39 YOF with small bilateral PE on CT. Pharmacy dosing heparin -heparin level at goal on 1100 units/hr  Goal of Therapy:  Heparin level 0.3-0.7 units/ml Monitor platelets by anticoagulation protocol: Yes   Plan:  -No heparin changes needed -Daily heparin level and CBC  44, PharmD Clinical Pharmacist **Pharmacist phone directory can now be found on [email or web address] ([email or web address]).  Listed under [facility].

## 2019-12-20 NOTE — Progress Notes (Signed)
Notified DO to "please see CT report.  I was notified pt has bilateral nonocclusive pulm emboli"

## 2019-12-20 NOTE — Progress Notes (Addendum)
PROGRESS NOTE    Anna Campos  WUJ:[id number] DOB: 31-Mar-1966 DOA: 12/18/2019 PCP: Farris Has, MD     Brief Narrative:  Anna Campos is a 54 year old female with no significant past medical history presenting with syncope.  She has been having generalized fatigue.  She has had poor p.o. intake and has not eaten in the last day.  She stood up this evening and passed out.  Family heard her fall.  She did hit her head.  She was told she was not unconscious for a long period of time.  She was noted by EMS to be hypotensive and she was given a small fluid bolus.  She was admitted to ICU due to syncope, hypovolemic shock.  Due to EKG changes, cardiology was consulted.  Patient was weaned off Levophed and transferred to [facility] service on 3/1.  New events last 24 hours / Subjective: Patient admits to diffuse weakness, states that she is not feeling well overall.  Unable to really move her extremities.  Normally, she is healthy and able to do normal activities of daily living without assistive devices. Denies any urinary or fecal incontinence or retention.   Assessment & Plan:   Active Problems:   Syncope   Hypovolemic shock and syncope -Admitted to ICU and now weaned off of Levophed.  Cortisol 23.5 -Appreciate cardiology; EKG shows nonspecific findings and pretest probability for CAD is low.  Troponin negative.  Echocardiogram with normal LVEF, normal RV size and function without valvular abnormalities.  No sign of right heart strain. No further cardiac work-up recommended -D-dimer elevated 1.74, check CTA chest --> showing PE. Start IV Heparin. Obtain venous doppler   Bilateral extremity weakness -Distal lower extremity weaker compared to proximal -Obtain MRI brain, cervical and thoracic spine -PT OT, recommend CIR   Strep constellatus bacteremia -Initially thought blood culture findings were contaminant. Discussed with ID. Recommended for either ampicillin or ancef (patient has  reaction of diarrhea listed for ampicillin). Also will order panorama to eval for dental caries/abscess    DVT prophylaxis: IV heparin Code Status: Full code Family Communication: Husband and daughter at bedside  Disposition Plan:  . Patient is from home prior to admission. . Currently in-hospital treatment needed due to further work-up with CTA chest, MRI brain, cervical and thoracic spine. . Suspect patient will discharge home versus SNF in 1 to 2 days   Consultants:   PCCM admission  Cardiology  ID   Procedures:   None  Antimicrobials:  Anti-infectives (From admission, onward)   Start     Dose/Rate Route Frequency Ordered Stop   12/20/19 1000  vancomycin (VANCOREADY) IVPB 750 mg/150 mL  Status:  Discontinued     750 mg 150 mL/hr over 60 Minutes Intravenous Every 12 hours 12/20/19 0054 12/20/19 1019   12/20/19 0130  vancomycin (VANCOREADY) IVPB 1250 mg/250 mL     1,250 mg 166.7 mL/hr over 90 Minutes Intravenous  Once 12/20/19 0054 12/20/19 0356       Objective: Vitals:   12/20/19 0450 12/20/19 0500 12/20/19 0700 12/20/19 0859  BP: (!) 91/58   92/61  Pulse: 70   67  Resp: 16 14 17 18   Temp: 98.4 F (36.9 C)   98.9 F (37.2 C)  TempSrc: Oral   Oral  SpO2: 100%   100%  Weight: 63.7 kg     Height:        Intake/Output Summary (Last 24 hours) at 12/20/2019 1301 Last data filed at 12/20/2019 0600 Gross per 24  hour  Intake 968.45 ml  Output 950 ml  Net 18.45 ml   Filed Weights   12/19/19 0622 12/19/19 1408 12/20/19 0450  Weight: 59.6 kg 61.4 kg 63.7 kg    Examination:  General exam: Appears calm and comfortable  Respiratory system: Clear to auscultation. Respiratory effort normal. No respiratory distress. No conversational dyspnea.  Cardiovascular system: S1 & S2 heard, RRR. No murmurs. No pedal edema. Gastrointestinal system: Abdomen is nondistended, soft and nontender. Normal bowel sounds heard. Central nervous system: Alert and oriented.  Diffuse  extremity weakness, +1 bilateral LE, +4 bilateral UE, poor hand grip bilaterally, +2 patellar reflexes bilaterally   Extremities: Symmetric in appearance  Skin: No rashes, lesions or ulcers on exposed skin  Psychiatry: Judgement and insight appear normal. Mood & affect appropriate.    Data Reviewed: I have personally reviewed following labs and imaging studies  CBC: Recent Labs  Lab 12/18/19 2230 12/18/19 2241 12/19/19 0155 12/19/19 1046 12/20/19 0325  WBC 7.9  --  10.8* 15.2* 8.4  NEUTROABS  --   --   --  13.7*  --   HGB 10.7* 10.5* 10.9* 10.1* 9.3*  HCT 34.0* 31.0* 35.2* 31.2* 28.3*  MCV 94.2  --  97.0 92.6 92.5  PLT 260  --  279 239 201   Basic Metabolic Panel: Recent Labs  Lab 12/18/19 2230 12/18/19 2241 12/19/19 0155 12/20/19 0325  NA 139 141 140 140  K 3.3* 3.3* 3.7 3.7  CL 111 106 114* 115*  CO2 22  --  19* 21*  GLUCOSE 130* 124* 124* 91  BUN 7 6 5* <5*  CREATININE 1.19* 1.10* 0.77 0.79  CALCIUM 8.2*  --  7.8* 8.0*  MG 1.6*  --  1.4* 2.3  PHOS  --   --  1.9*  --    GFR: Estimated Creatinine Clearance: 82.7 mL/min (by C-G formula based on SCr of 0.79 mg/dL). Liver Function Tests: Recent Labs  Lab 12/18/19 2230  AST 19  ALT 11  ALKPHOS 130*  BILITOT 0.6  PROT 5.6*  ALBUMIN 3.1*   Recent Labs  Lab 12/19/19 0155  LIPASE 25   No results for input(s): AMMONIA in the last 168 hours. Coagulation Profile: Recent Labs  Lab 12/18/19 2230  INR 1.1   Cardiac Enzymes: Recent Labs  Lab 12/19/19 0155  CKTOTAL 370*   BNP (last 3 results) No results for input(s): PROBNP in the last 8760 hours. HbA1C: Recent Labs    12/19/19 0155  HGBA1C 5.0   CBG: Recent Labs  Lab 12/19/19 0617 12/20/19 1141  GLUCAP 113* 92   Lipid Profile: No results for input(s): CHOL, HDL, LDLCALC, TRIG, CHOLHDL, LDLDIRECT in the last 72 hours. Thyroid Function Tests: Recent Labs    12/18/19 2356  TSH 2.352   Anemia Panel: No results for input(s): VITAMINB12,  FOLATE, FERRITIN, TIBC, IRON, RETICCTPCT in the last 72 hours. Sepsis Labs: Recent Labs  Lab 12/18/19 2230  LATICACIDVEN 1.8    Recent Results (from the past 240 hour(s))  Blood culture (routine x 2)     Status: Abnormal (Preliminary result)   Collection Time: 12/18/19 10:30 PM   Specimen: BLOOD LEFT ARM  Result Value Ref Range Status   Specimen Description BLOOD LEFT ARM  Final   Special Requests   Final    BOTTLES DRAWN AEROBIC AND ANAEROBIC Blood Culture results may not be optimal due to an excessive volume of blood received in culture bottles   Culture  Setup Time  Final    GRAM POSITIVE COCCI IN CHAINS IN BOTH AEROBIC AND ANAEROBIC BOTTLES CRITICAL RESULT CALLED TO, READ BACK BY AND VERIFIED WITH: K. HURTH,PHARMD 2221 12/19/2019 T. TYSOR    Culture (A)  Final    STREPTOCOCCUS CONSTELLATUS SUSCEPTIBILITIES TO FOLLOW Performed at [facility], [address] [place], [place] [address]    Report Status PENDING  Incomplete  Respiratory Panel by RT PCR (Flu A&B, Covid) - Nasopharyngeal Swab     Status: None   Collection Time: 12/18/19 11:05 PM   Specimen: Nasopharyngeal Swab  Result Value Ref Range Status   SARS Coronavirus 2 by RT PCR NEGATIVE NEGATIVE Final    Comment: (NOTE) SARS-CoV-2 target nucleic acids are NOT DETECTED. The SARS-CoV-2 RNA is generally detectable in upper respiratoy specimens during the acute phase of infection. The lowest concentration of SARS-CoV-2 viral copies this assay can detect is 131 copies/mL. A negative result does not preclude SARS-Cov-2 infection and should not be used as the sole basis for treatment or other patient management decisions. A negative result may occur with  improper specimen collection/handling, submission of specimen other than nasopharyngeal swab, presence of viral mutation(s) within the areas targeted by this assay, and inadequate number of viral copies (<131 copies/mL). A negative result must be combined with  clinical observations, patient history, and epidemiological information. The expected result is Negative. Fact Sheet for Patients:  https://www.moore.com/ Fact Sheet for Healthcare Providers:  https://www.young.biz/ This test is not yet ap proved or cleared by the [place] FDA and  has been authorized for detection and/or diagnosis of SARS-CoV-2 by FDA under an Emergency Use Authorization (EUA). This EUA will remain  in effect (meaning this test can be used) for the duration of the COVID-19 declaration under Section 564(b)(1) of the Act, 21 U.S.C. section 360bbb-3(b)(1), unless the authorization is terminated or revoked sooner.    Influenza A by PCR NEGATIVE NEGATIVE Final   Influenza B by PCR NEGATIVE NEGATIVE Final    Comment: (NOTE) The Xpert Xpress SARS-CoV-2/FLU/RSV assay is intended as an aid in  the diagnosis of influenza from Nasopharyngeal swab specimens and  should not be used as a sole basis for treatment. Nasal washings and  aspirates are unacceptable for Xpert Xpress SARS-CoV-2/FLU/RSV  testing. Fact Sheet for Patients: https://www.moore.com/ Fact Sheet for Healthcare Providers: https://www.young.biz/ This test is not yet approved or cleared by the [place] FDA and  has been authorized for detection and/or diagnosis of SARS-CoV-2 by  FDA under an Emergency Use Authorization (EUA). This EUA will remain  in effect (meaning this test can be used) for the duration of the  Covid-19 declaration under Section 564(b)(1) of the Act, 21  U.S.C. section 360bbb-3(b)(1), unless the authorization is  terminated or revoked. Performed at [facility], [address], [place], [place] [address]   Blood culture (routine x 2)     Status: None (Preliminary result)   Collection Time: 12/19/19  3:31 AM   Specimen: BLOOD RIGHT WRIST  Result Value Ref Range Status   Specimen Description BLOOD  RIGHT WRIST  Final   Special Requests   Final    BOTTLES DRAWN AEROBIC AND ANAEROBIC Blood Culture adequate volume   Culture   Final    NO GROWTH 1 DAY Performed at [facility], [address] [place], [place] [address]    Report Status PENDING  Incomplete  MRSA PCR Screening     Status: None   Collection Time: 12/19/19  6:16 AM   Specimen: Nasal Mucosa; Nasopharyngeal  Result Value Ref Range Status   MRSA by PCR NEGATIVE NEGATIVE Final    Comment:        The GeneXpert MRSA Assay (FDA approved for NASAL specimens only), is one component of a comprehensive MRSA colonization surveillance program. It is not intended to diagnose MRSA infection nor to guide or monitor treatment for MRSA infections. Performed at [facility], [address] [place], [place] [address]       Radiology Studies: CT Head Wo Contrast  Result Date: 12/18/2019 CLINICAL DATA:  Syncope, somnolent EXAM: CT HEAD WITHOUT CONTRAST TECHNIQUE: Contiguous axial images were obtained from the base of the skull through the vertex without intravenous contrast. COMPARISON:  None. FINDINGS: Brain: No acute infarct or hemorrhage. Lateral ventricles and midline structures are unremarkable. No acute extra-axial fluid collections. No mass effect. Vascular: No hyperdense vessel or unexpected calcification. Skull: Normal. Negative for fracture or focal lesion. Sinuses/Orbits: No acute finding. Other: None IMPRESSION: 1. No acute intracranial process. Electronically Signed   By: Sharlet SalinaMichael  Brown M.D.   On: 12/18/2019 23:22   CT Chest W Contrast  Result Date: 12/18/2019 CLINICAL DATA:  Chest trauma. EXAM: CT CHEST, ABDOMEN, AND PELVIS WITH CONTRAST TECHNIQUE: Multidetector CT imaging of the chest, abdomen and pelvis was performed following the standard protocol during bolus administration of intravenous contrast. CONTRAST:  100mL OMNIPAQUE IOHEXOL 350 MG/ML SOLN COMPARISON:  None. FINDINGS: CT CHEST FINDINGS  Cardiovascular: There is no large centrally located pulmonary embolism. No evidence for a thoracic aortic aneurysm or dissection. The visualized arch vessels are grossly patent. Minimal atherosclerotic changes are noted of the thoracic aorta. Mediastinum/Nodes: --No mediastinal or hilar lymphadenopathy. --No axillary lymphadenopathy. --No supraclavicular lymphadenopathy. --Normal thyroid gland. --The esophagus is unremarkable Lungs/Pleura: No pulmonary nodules or masses. No pleural effusion or pneumothorax. No focal airspace consolidation. No focal pleural abnormality. Musculoskeletal: No chest wall abnormality. No acute or significant osseous findings. CT ABDOMEN PELVIS FINDINGS Hepatobiliary: The liver is normal. Normal gallbladder.There is no biliary ductal dilation. Pancreas: Normal contours without ductal dilatation. No peripancreatic fluid collection. Spleen: No splenic laceration or hematoma. Adrenals/Urinary Tract: --Adrenal glands: No adrenal hemorrhage. --Right kidney/ureter: No hydronephrosis or perinephric hematoma. --Left kidney/ureter: No hydronephrosis or perinephric hematoma. --Urinary bladder: Unremarkable. Stomach/Bowel: --Stomach/Duodenum: No hiatal hernia or other gastric abnormality. Normal duodenal course and caliber. --Small bowel: No dilatation or inflammation. --Colon: No focal abnormality. --Appendix: Normal. Vascular/Lymphatic: Normal course and caliber of the major abdominal vessels. --No retroperitoneal lymphadenopathy. --No mesenteric lymphadenopathy. --No pelvic or inguinal lymphadenopathy. Reproductive: Unremarkable Other: No ascites or free air. The abdominal wall is normal. Musculoskeletal. No acute displaced fractures. IMPRESSION: 1. No acute finding of the chest, abdomen or pelvis. 2. Minimal atherosclerotic changes of the thoracic aorta. Electronically Signed   By: Katherine Mantlehristopher  Green M.D.   On: 12/18/2019 23:25   CT ANGIO CHEST PE W OR WO CONTRAST  Result Date:  12/20/2019 CLINICAL DATA:  54 year old female with history of syncope. Elevated D-dimer. Chest pain. EXAM: CT ANGIOGRAPHY CHEST WITH CONTRAST TECHNIQUE: Multidetector CT imaging of the chest was performed using the standard protocol during bolus administration of intravenous contrast. Multiplanar CT image reconstructions and MIPs were obtained to evaluate the vascular anatomy. CONTRAST:  80mL OMNIPAQUE IOHEXOL 350 MG/ML SOLN COMPARISON:  Chest CT 12/18/2019. FINDINGS: Cardiovascular: There are small filling defects within the pulmonary arteries bilaterally involving segmental sized branches in the left lower lobe and subsegmental sized branches in the right lower lobe.  These are nonocclusive. No larger central or lobar sized filling defects are noted. Heart size is normal. There is no significant pericardial fluid, thickening or pericardial calcification. There is aortic atherosclerosis, as well as atherosclerosis of the great vessels of the mediastinum and the coronary arteries, including calcified atherosclerotic plaque in the left anterior descending coronary artery. Mediastinum/Nodes: No pathologically enlarged mediastinal or hilar lymph nodes. Esophagus is unremarkable in appearance. No axillary lymphadenopathy. Lungs/Pleura: Dependent areas of subsegmental atelectasis are noted in lower lobes of the lungs bilaterally. No acute consolidative airspace disease. No pleural effusions. Upper Abdomen: Intermediate attenuation material lying dependently in the gallbladder, presumably biliary sludge. Musculoskeletal: There are no aggressive appearing lytic or blastic lesions noted in the visualized portions of the skeleton. Review of the MIP images confirms the above findings. IMPRESSION: 1. Small nonocclusive pulmonary emboli bilaterally involving segmental and subsegmental sized vessels, as above. 2. Aortic atherosclerosis, in addition to left anterior descending coronary artery disease. Please note that although the  presence of coronary artery calcium documents the presence of coronary artery disease, the severity of this disease and any potential stenosis cannot be assessed on this non-gated CT examination. Assessment for potential risk factor modification, dietary therapy or pharmacologic therapy may be warranted, if clinically indicated. 3. Biliary sludge in the gallbladder. These results will be called to the ordering clinician or representative by the Radiologist Assistant, and communication documented in the PACS or zVision Dashboard. Aortic Atherosclerosis (ICD10-I70.0). Electronically Signed   By: Trudie Reed M.D.   On: 12/20/2019 10:53   CT Cervical Spine Wo Contrast  Result Date: 12/18/2019 CLINICAL DATA:  Syncope, somnolent EXAM: CT CERVICAL SPINE WITHOUT CONTRAST TECHNIQUE: Multidetector CT imaging of the cervical spine was performed without intravenous contrast. Multiplanar CT image reconstructions were also generated. COMPARISON:  None. FINDINGS: Alignment: Alignment is anatomic. Skull base and vertebrae: No acute displaced fractures. Soft tissues and spinal canal: No prevertebral fluid or swelling. No visible canal hematoma. Disc levels: There is extensive multilevel cervical spondylosis with disc space narrowing and circumferential osteophyte formation seen at C3-4, C4-5, C5-6, and C6-7. There is symmetrical neural foraminal encroachment at these levels. Upper chest: Airway is patent.  Lung apices are clear. Other: Reconstructed images demonstrate no additional findings. IMPRESSION: 1. Extensive multilevel cervical spondylosis.  No acute fracture. Electronically Signed   By: Sharlet Salina M.D.   On: 12/18/2019 23:23   CT ABDOMEN PELVIS W CONTRAST  Result Date: 12/18/2019 CLINICAL DATA:  Chest trauma. EXAM: CT CHEST, ABDOMEN, AND PELVIS WITH CONTRAST TECHNIQUE: Multidetector CT imaging of the chest, abdomen and pelvis was performed following the standard protocol during bolus administration of  intravenous contrast. CONTRAST:  OMNIPAQUE IOHEXOL 350 MG/ML SOLN COMPARISON:  None. FINDINGS: CT CHEST FINDINGS Cardiovascular: There is no large centrally located pulmonary embolism. No evidence for a thoracic aortic aneurysm or dissection. The visualized arch vessels are grossly patent. Minimal atherosclerotic changes are noted of the thoracic aorta. Mediastinum/Nodes: --No mediastinal or hilar lymphadenopathy. --No axillary lymphadenopathy. --No supraclavicular lymphadenopathy. --Normal thyroid gland. --The esophagus is unremarkable Lungs/Pleura: No pulmonary nodules or masses. No pleural effusion or pneumothorax. No focal airspace consolidation. No focal pleural abnormality. Musculoskeletal: No chest wall abnormality. No acute or significant osseous findings. CT ABDOMEN PELVIS FINDINGS Hepatobiliary: The liver is normal. Normal gallbladder.There is no biliary ductal dilation. Pancreas: Normal contours without ductal dilatation. No peripancreatic fluid collection. Spleen: No splenic laceration or hematoma. Adrenals/Urinary Tract: --Adrenal glands: No adrenal hemorrhage. --Right kidney/ureter: No hydronephrosis or perinephric hematoma. --Left  kidney/ureter: No hydronephrosis or perinephric hematoma. --Urinary bladder: Unremarkable. Stomach/Bowel: --Stomach/Duodenum: No hiatal hernia or other gastric abnormality. Normal duodenal course and caliber. --Small bowel: No dilatation or inflammation. --Colon: No focal abnormality. --Appendix: Normal. Vascular/Lymphatic: Normal course and caliber of the major abdominal vessels. --No retroperitoneal lymphadenopathy. --No mesenteric lymphadenopathy. --No pelvic or inguinal lymphadenopathy. Reproductive: Unremarkable Other: No ascites or free air. The abdominal wall is normal. Musculoskeletal. No acute displaced fractures. IMPRESSION: 1. No acute finding of the chest, abdomen or pelvis. 2. Minimal atherosclerotic changes of the thoracic aorta. Electronically Signed    By: Katherine Mantle M.D.   On: 12/18/2019 23:25   DG Pelvis Portable  Result Date: 12/18/2019 CLINICAL DATA:  Syncope, fell EXAM: PORTABLE PELVIS 1-2 VIEWS COMPARISON:  None. FINDINGS: Supine frontal view of the pelvis was performed. There are no acute displaced fractures. The hips are well aligned. Joint spaces are well preserved. Sacroiliac joints are normal. IMPRESSION: 1. No acute displaced fracture. Electronically Signed   By: Sharlet Salina M.D.   On: 12/18/2019 22:47   DG Chest Portable 1 View  Result Date: 12/18/2019 CLINICAL DATA:  Syncope, fell EXAM: PORTABLE CHEST 1 VIEW COMPARISON:  None. FINDINGS: The heart size and mediastinal contours are within normal limits. Both lungs are clear. The visualized skeletal structures are unremarkable. IMPRESSION: No active disease. Electronically Signed   By: Sharlet Salina M.D.   On: 12/18/2019 22:47   ECHOCARDIOGRAM COMPLETE  Result Date: 12/19/2019    ECHOCARDIOGRAM REPORT   Patient Name:   SARIYAH ZWIERS Date of Exam: 12/19/2019 Medical Rec #:  [id number]   Height:       67.0 in Accession #:    [id number]  Weight:       131.4 lb Date of Birth:  1966/05/13  BSA:          1.692 m Patient Age:    53 years    BP:           90/50 mmHg Patient Gender: F           HR:           77 bpm. Exam Location:  Inpatient Procedure: 2D Echo Indications:    syncope 780.2  History:        Patient has no prior history of Echocardiogram examinations.  Sonographer:    Delcie Roch Referring Phys: [id number] Colleen Donahoe T KRALL IMPRESSIONS  1. Left ventricular ejection fraction, by estimation, is 60 to 65%. The left ventricle has normal function. The left ventricle has no regional wall motion abnormalities. Left ventricular diastolic parameters were normal.  2. Right ventricular systolic function is normal. The right ventricular size is normal. Tricuspid regurgitation signal is inadequate for assessing PA pressure.  3. The mitral valve is normal in structure and function. No  evidence of mitral valve regurgitation. No evidence of mitral stenosis.  4. The aortic valve is tricuspid. Aortic valve regurgitation is not visualized. Mild aortic valve sclerosis is present, with no evidence of aortic valve stenosis.  5. The inferior vena cava is dilated in size with >50% respiratory variability, suggesting right atrial pressure of 8 mmHg. FINDINGS  Left Ventricle: Left ventricular ejection fraction, by estimation, is 60 to 65%. The left ventricle has normal function. The left ventricle has no regional wall motion abnormalities. The left ventricular internal cavity size was normal in size. There is  no left ventricular hypertrophy. Left ventricular diastolic parameters were normal. Normal left ventricular filling pressure. Right Ventricle: The right ventricular size is  normal. No increase in right ventricular wall thickness. Right ventricular systolic function is normal. Tricuspid regurgitation signal is inadequate for assessing PA pressure. Left Atrium: Left atrial size was normal in size. Right Atrium: Right atrial size was normal in size. Pericardium: There is no evidence of pericardial effusion. Mitral Valve: The mitral valve is normal in structure and function. Normal mobility of the mitral valve leaflets. No evidence of mitral valve regurgitation. No evidence of mitral valve stenosis. Tricuspid Valve: The tricuspid valve is normal in structure. Tricuspid valve regurgitation is mild . No evidence of tricuspid stenosis. Aortic Valve: The aortic valve is tricuspid. Aortic valve regurgitation is not visualized. Mild aortic valve sclerosis is present, with no evidence of aortic valve stenosis. Pulmonic Valve: The pulmonic valve was normal in structure. Pulmonic valve regurgitation is not visualized. No evidence of pulmonic stenosis. Aorta: The aortic root is normal in size and structure. Venous: The inferior vena cava is dilated in size with greater than 50% respiratory variability, suggesting  right atrial pressure of 8 mmHg. IAS/Shunts: No atrial level shunt detected by color flow Doppler.  LEFT VENTRICLE PLAX 2D LVIDd:         4.30 cm  Diastology LVIDs:         2.80 cm  LV e' lateral:   12.10 cm/s LV PW:         0.90 cm  LV E/e' lateral: 6.6 LV IVS:        0.90 cm  LV e' medial:    11.30 cm/s LVOT diam:     1.80 cm  LV E/e' medial:  7.0 LV SV:         73 LV SV Index:   43 LVOT Area:     2.54 cm  RIGHT VENTRICLE RV S prime:     15.60 cm/s TAPSE (M-mode): 2.2 cm LEFT ATRIUM             Index       RIGHT ATRIUM          Index LA diam:        2.80 cm 1.66 cm/m  RA Area:     8.61 cm LA Vol (A2C):   27.4 ml 16.20 ml/m RA Volume:   16.80 ml 9.93 ml/m LA Vol (A4C):   27.9 ml 16.49 ml/m LA Biplane Vol: 27.7 ml 16.38 ml/m  AORTIC VALVE LVOT Vmax:   144.00 cm/s LVOT Vmean:  93.300 cm/s LVOT VTI:    0.285 m  AORTA Ao Root diam: 3.20 cm MITRAL VALVE MV Area (PHT): 3.42 cm    SHUNTS MV Decel Time: 222 msec    Systemic VTI:  0.29 m MV E velocity: 79.30 cm/s  Systemic Diam: 1.80 cm MV A velocity: 89.10 cm/s MV E/A ratio:  0.89 Armanda Magicraci Turner MD Electronically signed by Armanda Magicraci Turner MD Signature Date/Time: 12/19/2019/10:44:12 AM    Final    CT Maxillofacial Wo Contrast  Result Date: 12/18/2019 CLINICAL DATA:  Facial trauma, syncope, somnolent EXAM: CT MAXILLOFACIAL WITHOUT CONTRAST TECHNIQUE: Multidetector CT imaging of the maxillofacial structures was performed. Multiplanar CT image reconstructions were also generated. COMPARISON:  None. FINDINGS: Osseous: No fracture or mandibular dislocation. No destructive process. Orbits: Negative. No traumatic or inflammatory finding. Sinuses: Clear. Soft tissues: Negative. Limited intracranial: No significant or unexpected finding. IMPRESSION: 1. No acute facial bone fracture. Electronically Signed   By: Sharlet SalinaMichael  Brown M.D.   On: 12/18/2019 23:26      Scheduled Meds: . Chlorhexidine Gluconate Cloth  6  each Topical Daily   Continuous Infusions: . heparin 1,100  Units/hr (12/20/19 1222)     LOS: 1 day      Time spent: 45 minutes   Dessa Phi, DO [facility] 12/20/2019, 1:01 PM   Available via Epic secure chat 7am-7pm After these hours, please refer to coverage provider listed on [email or web address]

## 2019-12-20 NOTE — Progress Notes (Deleted)
PROGRESS NOTE    Anna Campos  YOM:[id number] DOB: Jul 01, 1966 DOA: 12/18/2019 PCP: Farris Has, MD     Brief Narrative:  Anna Campos is a 54 year old female with no significant past medical history presenting with syncope.  She has been having generalized fatigue.  She has had poor p.o. intake and has not eaten in the last day.  She stood up this evening and passed out.  Family heard her fall.  She did hit her head.  She was told she was not unconscious for a long period of time.  She was noted by EMS to be hypotensive and she was given a small fluid bolus.  She was admitted to ICU due to syncope, hypovolemic shock.  Due to EKG changes, cardiology was consulted.  Patient was weaned off Levophed and transferred to [facility] on 3/1.  New events last 24 hours / Subjective: Patient admits to diffuse weakness, states that she is not feeling well overall.  Unable to really move her extremities.  Normally, she is healthy and able to do normal activities of daily living without assistive devices.  Assessment & Plan:   Active Problems:   Syncope   Hypovolemic shock and syncope -Admitted to ICU and now weaned off of Levophed.  Cortisol 23.5 -Appreciate cardiology; EKG shows nonspecific findings and pretest probability for CAD is low.  Troponin negative.  Echocardiogram with normal LVEF, normal RV size and function without valvular abnormalities.  No sign of right heart strain. No further cardiac work-up recommended -D-dimer elevated 1.74, check CTA chest  Bilateral extremity weakness -Distal lower extremity weaker compared to proximal -Obtain MRI brain, cervical and thoracic spine -PT OT  Poor dentition -Need outpatient dentist follow-up  Gram-positive cocci in blood culture -This likely represents contamination.  Patient does not appear to be septic at this time.  Second set of blood cultures are negative growth to date.  Stop vancomycin   DVT prophylaxis: Lovenox Code  Status: Full code Family Communication: No family at bedside Disposition Plan:  . Patient is from home prior to admission. . Currently in-hospital treatment needed due to further work-up with CTA chest, MRI brain, cervical and thoracic spine. . Suspect patient will discharge home versus SNF in 1 to 2 days   Consultants:   PCCM admission  Cardiology  Procedures:   None  Antimicrobials:  Anti-infectives (From admission, onward)   Start     Dose/Rate Route Frequency Ordered Stop   12/20/19 1000  vancomycin (VANCOREADY) IVPB 750 mg/150 mL     750 mg 150 mL/hr over 60 Minutes Intravenous Every 12 hours 12/20/19 0054     12/20/19 0130  vancomycin (VANCOREADY) IVPB 1250 mg/250 mL     1,250 mg 166.7 mL/hr over 90 Minutes Intravenous  Once 12/20/19 0054 12/20/19 0356        Objective: Vitals:   12/20/19 0450 12/20/19 0500 12/20/19 0700 12/20/19 0859  BP: (!) 91/58   92/61  Pulse: 70   67  Resp: 16 14 17 18   Temp: 98.4 F (36.9 C)   98.9 F (37.2 C)  TempSrc: Oral   Oral  SpO2: 100%   100%  Weight: 63.7 kg     Height:        Intake/Output Summary (Last 24 hours) at 12/20/2019 1012 Last data filed at 12/20/2019 0600 Gross per 24 hour  Intake 1091.41 ml  Output 950 ml  Net 141.41 ml   Filed Weights   12/19/19 0622 12/19/19 1408 12/20/19 0450  Weight:  59.6 kg 61.4 kg 63.7 kg    Examination:  General exam: Appears calm and comfortable  Respiratory system: Clear to auscultation. Respiratory effort normal. No respiratory distress. No conversational dyspnea.  Cardiovascular system: S1 & S2 heard, RRR. No murmurs. No pedal edema. Gastrointestinal system: Abdomen is nondistended, soft and nontender. Normal bowel sounds heard. Central nervous system: Alert and oriented.  Diffuse extremity weakness, +1 bilateral LE, +4 bilateral UE, poor hand grip bilaterally  Extremities: Symmetric in appearance  Skin: No rashes, lesions or ulcers on exposed skin  Psychiatry: Judgement and  insight appear normal. Mood & affect appropriate.    Data Reviewed: I have personally reviewed following labs and imaging studies  CBC: Recent Labs  Lab 12/18/19 2230 12/18/19 2241 12/19/19 0155 12/19/19 1046 12/20/19 0325  WBC 7.9  --  10.8* 15.2* 8.4  NEUTROABS  --   --   --  13.7*  --   HGB 10.7* 10.5* 10.9* 10.1* 9.3*  HCT 34.0* 31.0* 35.2* 31.2* 28.3*  MCV 94.2  --  97.0 92.6 92.5  PLT 260  --  279 239 201   Basic Metabolic Panel: Recent Labs  Lab 12/18/19 2230 12/18/19 2241 12/19/19 0155 12/20/19 0325  NA 139 141 140 140  K 3.3* 3.3* 3.7 3.7  CL 111 106 114* 115*  CO2 22  --  19* 21*  GLUCOSE 130* 124* 124* 91  BUN 7 6 5* <5*  CREATININE 1.19* 1.10* 0.77 0.79  CALCIUM 8.2*  --  7.8* 8.0*  MG 1.6*  --  1.4* 2.3  PHOS  --   --  1.9*  --    GFR: Estimated Creatinine Clearance: 82.7 mL/min (by C-G formula based on SCr of 0.79 mg/dL). Liver Function Tests: Recent Labs  Lab 12/18/19 2230  AST 19  ALT 11  ALKPHOS 130*  BILITOT 0.6  PROT 5.6*  ALBUMIN 3.1*   Recent Labs  Lab 12/19/19 0155  LIPASE 25   No results for input(s): AMMONIA in the last 168 hours. Coagulation Profile: Recent Labs  Lab 12/18/19 2230  INR 1.1   Cardiac Enzymes: Recent Labs  Lab 12/19/19 0155  CKTOTAL 370*   BNP (last 3 results) No results for input(s): PROBNP in the last 8760 hours. HbA1C: Recent Labs    12/19/19 0155  HGBA1C 5.0   CBG: Recent Labs  Lab 12/19/19 0617  GLUCAP 113*   Lipid Profile: No results for input(s): CHOL, HDL, LDLCALC, TRIG, CHOLHDL, LDLDIRECT in the last 72 hours. Thyroid Function Tests: Recent Labs    12/18/19 2356  TSH 2.352   Anemia Panel: No results for input(s): VITAMINB12, FOLATE, FERRITIN, TIBC, IRON, RETICCTPCT in the last 72 hours. Sepsis Labs: Recent Labs  Lab 12/18/19 2230  LATICACIDVEN 1.8    Recent Results (from the past 240 hour(s))  Blood culture (routine x 2)     Status: None (Preliminary result)    Collection Time: 12/18/19 10:30 PM   Specimen: BLOOD LEFT ARM  Result Value Ref Range Status   Specimen Description BLOOD LEFT ARM  Final   Special Requests   Final    BOTTLES DRAWN AEROBIC AND ANAEROBIC Blood Culture results may not be optimal due to an excessive volume of blood received in culture bottles   Culture  Setup Time   Final    GRAM POSITIVE COCCI IN CHAINS ANAEROBIC BOTTLE ONLY CRITICAL RESULT CALLED TO, READ BACK BY AND VERIFIED WITH: K. HURTH,PHARMD 2221 12/19/2019 T. TYSOR Performed at [facility], [address] [place], [place] [address]    Culture GRAM POSITIVE COCCI  Final   Report Status PENDING  Incomplete  Respiratory Panel by RT PCR (Flu A&B, Covid) - Nasopharyngeal Swab     Status: None   Collection Time: 12/18/19 11:05 PM   Specimen: Nasopharyngeal Swab  Result Value Ref Range Status   SARS Coronavirus 2 by RT PCR NEGATIVE NEGATIVE Final    Comment: (NOTE) SARS-CoV-2 target nucleic acids are NOT DETECTED. The SARS-CoV-2 RNA is generally detectable in upper respiratoy specimens during the acute phase of infection. The lowest concentration of SARS-CoV-2 viral copies this assay can detect is 131 copies/mL. A negative result does not preclude SARS-Cov-2 infection and should not be used as the sole basis for treatment or other patient management decisions. A negative result may occur with  improper specimen collection/handling, submission of specimen other than nasopharyngeal swab, presence of viral mutation(s) within the areas targeted by this assay, and inadequate number of viral copies (<131 copies/mL). A negative result must be combined with clinical observations, patient history, and epidemiological information. The expected result is Negative. Fact Sheet for Patients:  https://www.moore.com/https://www.fda.gov/media/142436/download Fact Sheet for Healthcare Providers:  https://www.young.biz/https://www.fda.gov/media/142435/download This test is not yet ap proved or cleared by the [place] FDA and  has been authorized for detection and/or diagnosis of SARS-CoV-2 by FDA under an Emergency Use Authorization (EUA). This EUA will remain  in effect (meaning this test can be used) for the duration of the COVID-19 declaration under Section 564(b)(1) of the Act, 21 U.S.C. section 360bbb-3(b)(1), unless the authorization is terminated or revoked sooner.    Influenza A by PCR NEGATIVE NEGATIVE Final   Influenza B by PCR NEGATIVE NEGATIVE Final    Comment: (NOTE) The Xpert Xpress SARS-CoV-2/FLU/RSV assay is intended as an aid in  the diagnosis of influenza from Nasopharyngeal swab specimens and  should not be used as a sole basis for treatment. Nasal washings and  aspirates are unacceptable for Xpert Xpress SARS-CoV-2/FLU/RSV  testing. Fact Sheet for Patients: https://www.moore.com/https://www.fda.gov/media/142436/download Fact Sheet for Healthcare Providers: https://www.young.biz/https://www.fda.gov/media/142435/download This test is not yet approved or cleared by the [place] FDA and  has been authorized for detection and/or diagnosis of SARS-CoV-2 by  FDA under an Emergency Use Authorization (EUA). This EUA will remain  in effect (meaning this test can be used) for the duration of the  Covid-19 declaration under Section 564(b)(1) of the Act, 21  U.S.C. section 360bbb-3(b)(1), unless the authorization is  terminated or revoked. Performed at [facility], [address] [place], [place] [address]   Blood culture (routine x 2)     Status: None (Preliminary result)   Collection Time: 12/19/19  3:31 AM   Specimen: BLOOD RIGHT WRIST  Result Value Ref Range Status   Specimen Description BLOOD RIGHT WRIST  Final   Special Requests   Final    BOTTLES DRAWN AEROBIC AND ANAEROBIC Blood Culture adequate volume   Culture   Final    NO GROWTH 1 DAY Performed at [facility], [address] [place], [place] [address]    Report Status PENDING  Incomplete  MRSA PCR Screening     Status: None    Collection Time: 12/19/19  6:16 AM   Specimen: Nasal Mucosa; Nasopharyngeal  Result Value Ref Range Status   MRSA by PCR NEGATIVE NEGATIVE Final    Comment:        The GeneXpert MRSA Assay (FDA approved for NASAL specimens only), is one  component of a comprehensive MRSA colonization surveillance program. It is not intended to diagnose MRSA infection nor to guide or monitor treatment for MRSA infections. Performed at [facility], [address], [place], [place] [address]       Radiology Studies: CT Head Wo Contrast  Result Date: 12/18/2019 CLINICAL DATA:  Syncope, somnolent EXAM: CT HEAD WITHOUT CONTRAST TECHNIQUE: Contiguous axial images were obtained from the base of the skull through the vertex without intravenous contrast. COMPARISON:  None. FINDINGS: Brain: No acute infarct or hemorrhage. Lateral ventricles and midline structures are unremarkable. No acute extra-axial fluid collections. No mass effect. Vascular: No hyperdense vessel or unexpected calcification. Skull: Normal. Negative for fracture or focal lesion. Sinuses/Orbits: No acute finding. Other: None IMPRESSION: 1. No acute intracranial process. Electronically Signed   By: Sharlet Salina M.D.   On: 12/18/2019 23:22   CT Chest W Contrast  Result Date: 12/18/2019 CLINICAL DATA:  Chest trauma. EXAM: CT CHEST, ABDOMEN, AND PELVIS WITH CONTRAST TECHNIQUE: Multidetector CT imaging of the chest, abdomen and pelvis was performed following the standard protocol during bolus administration of intravenous contrast. CONTRAST:  OMNIPAQUE IOHEXOL 350 MG/ML SOLN COMPARISON:  None. FINDINGS: CT CHEST FINDINGS Cardiovascular: There is no large centrally located pulmonary embolism. No evidence for a thoracic aortic aneurysm or dissection. The visualized arch vessels are grossly patent. Minimal atherosclerotic changes are noted of the thoracic aorta. Mediastinum/Nodes: --No mediastinal or hilar lymphadenopathy. --No axillary  lymphadenopathy. --No supraclavicular lymphadenopathy. --Normal thyroid gland. --The esophagus is unremarkable Lungs/Pleura: No pulmonary nodules or masses. No pleural effusion or pneumothorax. No focal airspace consolidation. No focal pleural abnormality. Musculoskeletal: No chest wall abnormality. No acute or significant osseous findings. CT ABDOMEN PELVIS FINDINGS Hepatobiliary: The liver is normal. Normal gallbladder.There is no biliary ductal dilation. Pancreas: Normal contours without ductal dilatation. No peripancreatic fluid collection. Spleen: No splenic laceration or hematoma. Adrenals/Urinary Tract: --Adrenal glands: No adrenal hemorrhage. --Right kidney/ureter: No hydronephrosis or perinephric hematoma. --Left kidney/ureter: No hydronephrosis or perinephric hematoma. --Urinary bladder: Unremarkable. Stomach/Bowel: --Stomach/Duodenum: No hiatal hernia or other gastric abnormality. Normal duodenal course and caliber. --Small bowel: No dilatation or inflammation. --Colon: No focal abnormality. --Appendix: Normal. Vascular/Lymphatic: Normal course and caliber of the major abdominal vessels. --No retroperitoneal lymphadenopathy. --No mesenteric lymphadenopathy. --No pelvic or inguinal lymphadenopathy. Reproductive: Unremarkable Other: No ascites or free air. The abdominal wall is normal. Musculoskeletal. No acute displaced fractures. IMPRESSION: 1. No acute finding of the chest, abdomen or pelvis. 2. Minimal atherosclerotic changes of the thoracic aorta. Electronically Signed   By: Katherine Mantle M.D.   On: 12/18/2019 23:25   CT Cervical Spine Wo Contrast  Result Date: 12/18/2019 CLINICAL DATA:  Syncope, somnolent EXAM: CT CERVICAL SPINE WITHOUT CONTRAST TECHNIQUE: Multidetector CT imaging of the cervical spine was performed without intravenous contrast. Multiplanar CT image reconstructions were also generated. COMPARISON:  None. FINDINGS: Alignment: Alignment is anatomic. Skull base and vertebrae: No  acute displaced fractures. Soft tissues and spinal canal: No prevertebral fluid or swelling. No visible canal hematoma. Disc levels: There is extensive multilevel cervical spondylosis with disc space narrowing and circumferential osteophyte formation seen at C3-4, C4-5, C5-6, and C6-7. There is symmetrical neural foraminal encroachment at these levels. Upper chest: Airway is patent.  Lung apices are clear. Other: Reconstructed images demonstrate no additional findings. IMPRESSION: 1. Extensive multilevel cervical spondylosis.  No acute fracture. Electronically Signed   By: Sharlet Salina M.D.   On: 12/18/2019 23:23   CT ABDOMEN PELVIS W CONTRAST  Result Date: 12/18/2019 CLINICAL DATA:  Chest trauma. EXAM: CT CHEST, ABDOMEN, AND PELVIS WITH CONTRAST TECHNIQUE: Multidetector CT imaging of the chest, abdomen and pelvis was performed following the standard protocol during bolus administration of intravenous contrast. CONTRAST:  128mL OMNIPAQUE IOHEXOL 350 MG/ML SOLN COMPARISON:  None. FINDINGS: CT CHEST FINDINGS Cardiovascular: There is no large centrally located pulmonary embolism. No evidence for a thoracic aortic aneurysm or dissection. The visualized arch vessels are grossly patent. Minimal atherosclerotic changes are noted of the thoracic aorta. Mediastinum/Nodes: --No mediastinal or hilar lymphadenopathy. --No axillary lymphadenopathy. --No supraclavicular lymphadenopathy. --Normal thyroid gland. --The esophagus is unremarkable Lungs/Pleura: No pulmonary nodules or masses. No pleural effusion or pneumothorax. No focal airspace consolidation. No focal pleural abnormality. Musculoskeletal: No chest wall abnormality. No acute or significant osseous findings. CT ABDOMEN PELVIS FINDINGS Hepatobiliary: The liver is normal. Normal gallbladder.There is no biliary ductal dilation. Pancreas: Normal contours without ductal dilatation. No peripancreatic fluid collection. Spleen: No splenic laceration or hematoma.  Adrenals/Urinary Tract: --Adrenal glands: No adrenal hemorrhage. --Right kidney/ureter: No hydronephrosis or perinephric hematoma. --Left kidney/ureter: No hydronephrosis or perinephric hematoma. --Urinary bladder: Unremarkable. Stomach/Bowel: --Stomach/Duodenum: No hiatal hernia or other gastric abnormality. Normal duodenal course and caliber. --Small bowel: No dilatation or inflammation. --Colon: No focal abnormality. --Appendix: Normal. Vascular/Lymphatic: Normal course and caliber of the major abdominal vessels. --No retroperitoneal lymphadenopathy. --No mesenteric lymphadenopathy. --No pelvic or inguinal lymphadenopathy. Reproductive: Unremarkable Other: No ascites or free air. The abdominal wall is normal. Musculoskeletal. No acute displaced fractures. IMPRESSION: 1. No acute finding of the chest, abdomen or pelvis. 2. Minimal atherosclerotic changes of the thoracic aorta. Electronically Signed   By: Constance Holster M.D.   On: 12/18/2019 23:25   DG Pelvis Portable  Result Date: 12/18/2019 CLINICAL DATA:  Syncope, fell EXAM: PORTABLE PELVIS 1-2 VIEWS COMPARISON:  None. FINDINGS: Supine frontal view of the pelvis was performed. There are no acute displaced fractures. The hips are well aligned. Joint spaces are well preserved. Sacroiliac joints are normal. IMPRESSION: 1. No acute displaced fracture. Electronically Signed   By: Randa Ngo M.D.   On: 12/18/2019 22:47   DG Chest Portable 1 View  Result Date: 12/18/2019 CLINICAL DATA:  Syncope, fell EXAM: PORTABLE CHEST 1 VIEW COMPARISON:  None. FINDINGS: The heart size and mediastinal contours are within normal limits. Both lungs are clear. The visualized skeletal structures are unremarkable. IMPRESSION: No active disease. Electronically Signed   By: Randa Ngo M.D.   On: 12/18/2019 22:47   ECHOCARDIOGRAM COMPLETE  Result Date: 12/19/2019    ECHOCARDIOGRAM REPORT   Patient Name:   LAMOINE FREDRICKSEN Date of Exam: 12/19/2019 Medical Rec #:  [id number]    Height:       67.0 in Accession #:    [id number]  Weight:       131.4 lb Date of Birth:  May 28, 1966  BSA:          1.692 m Patient Age:    62 years    BP:           90/50 mmHg Patient Gender: F           HR:           77 bpm. Exam Location:  Inpatient Procedure: 2D Echo Indications:    syncope 780.2  History:        Patient has no prior history of Echocardiogram examinations.  Sonographer:    Johny Chess Referring Phys: [id number] Doris Mcgilvery T KRALL IMPRESSIONS  1. Left ventricular ejection fraction,  by estimation, is 60 to 65%. The left ventricle has normal function. The left ventricle has no regional wall motion abnormalities. Left ventricular diastolic parameters were normal.  2. Right ventricular systolic function is normal. The right ventricular size is normal. Tricuspid regurgitation signal is inadequate for assessing PA pressure.  3. The mitral valve is normal in structure and function. No evidence of mitral valve regurgitation. No evidence of mitral stenosis.  4. The aortic valve is tricuspid. Aortic valve regurgitation is not visualized. Mild aortic valve sclerosis is present, with no evidence of aortic valve stenosis.  5. The inferior vena cava is dilated in size with >50% respiratory variability, suggesting right atrial pressure of 8 mmHg. FINDINGS  Left Ventricle: Left ventricular ejection fraction, by estimation, is 60 to 65%. The left ventricle has normal function. The left ventricle has no regional wall motion abnormalities. The left ventricular internal cavity size was normal in size. There is  no left ventricular hypertrophy. Left ventricular diastolic parameters were normal. Normal left ventricular filling pressure. Right Ventricle: The right ventricular size is normal. No increase in right ventricular wall thickness. Right ventricular systolic function is normal. Tricuspid regurgitation signal is inadequate for assessing PA pressure. Left Atrium: Left atrial size was normal in size. Right  Atrium: Right atrial size was normal in size. Pericardium: There is no evidence of pericardial effusion. Mitral Valve: The mitral valve is normal in structure and function. Normal mobility of the mitral valve leaflets. No evidence of mitral valve regurgitation. No evidence of mitral valve stenosis. Tricuspid Valve: The tricuspid valve is normal in structure. Tricuspid valve regurgitation is mild . No evidence of tricuspid stenosis. Aortic Valve: The aortic valve is tricuspid. Aortic valve regurgitation is not visualized. Mild aortic valve sclerosis is present, with no evidence of aortic valve stenosis. Pulmonic Valve: The pulmonic valve was normal in structure. Pulmonic valve regurgitation is not visualized. No evidence of pulmonic stenosis. Aorta: The aortic root is normal in size and structure. Venous: The inferior vena cava is dilated in size with greater than 50% respiratory variability, suggesting right atrial pressure of 8 mmHg. IAS/Shunts: No atrial level shunt detected by color flow Doppler.  LEFT VENTRICLE PLAX 2D LVIDd:         4.30 cm  Diastology LVIDs:         2.80 cm  LV e' lateral:   12.10 cm/s LV PW:         0.90 cm  LV E/e' lateral: 6.6 LV IVS:        0.90 cm  LV e' medial:    11.30 cm/s LVOT diam:     1.80 cm  LV E/e' medial:  7.0 LV SV:         73 LV SV Index:   43 LVOT Area:     2.54 cm  RIGHT VENTRICLE RV S prime:     15.60 cm/s TAPSE (M-mode): 2.2 cm LEFT ATRIUM             Index       RIGHT ATRIUM          Index LA diam:        2.80 cm 1.66 cm/m  RA Area:     8.61 cm LA Vol (A2C):   27.4 ml 16.20 ml/m RA Volume:   16.80 ml 9.93 ml/m LA Vol (A4C):   27.9 ml 16.49 ml/m LA Biplane Vol: 27.7 ml 16.38 ml/m  AORTIC VALVE LVOT Vmax:   144.00 cm/s LVOT Vmean:  93.300 cm/s LVOT VTI:    0.285 m  AORTA Ao Root diam: 3.20 cm MITRAL VALVE MV Area (PHT): 3.42 cm    SHUNTS MV Decel Time: 222 msec    Systemic VTI:  0.29 m MV E velocity: 79.30 cm/s  Systemic Diam: 1.80 cm MV A velocity: 89.10 cm/s MV  E/A ratio:  0.89 Armanda Magic MD Electronically signed by Armanda Magic MD Signature Date/Time: 12/19/2019/10:44:12 AM    Final    CT Maxillofacial Wo Contrast  Result Date: 12/18/2019 CLINICAL DATA:  Facial trauma, syncope, somnolent EXAM: CT MAXILLOFACIAL WITHOUT CONTRAST TECHNIQUE: Multidetector CT imaging of the maxillofacial structures was performed. Multiplanar CT image reconstructions were also generated. COMPARISON:  None. FINDINGS: Osseous: No fracture or mandibular dislocation. No destructive process. Orbits: Negative. No traumatic or inflammatory finding. Sinuses: Clear. Soft tissues: Negative. Limited intracranial: No significant or unexpected finding. IMPRESSION: 1. No acute facial bone fracture. Electronically Signed   By: Sharlet Salina M.D.   On: 12/18/2019 23:26      Scheduled Meds: . Chlorhexidine Gluconate Cloth  6 each Topical Daily  . enoxaparin (LOVENOX) injection  40 mg Subcutaneous Daily  . potassium & sodium phosphates  1 packet Oral Once   Continuous Infusions: . vancomycin 750 mg (12/20/19 0935)     LOS: 1 day      Time spent: 45 minutes   Noralee Stain, DO [facility] 12/20/2019, 10:12 AM   Available via Epic secure chat 7am-7pm After these hours, please refer to coverage provider listed on [email or web address]

## 2019-12-20 NOTE — Progress Notes (Signed)
Pt back on floor at this time via hospital bed accompanied by transport

## 2019-12-20 NOTE — Progress Notes (Signed)
Patient remains off the floor, unable to do consult for streptococcal bacteremia.  For now, continue on iv vancomycin. Please repeat blood cx. We will see her in the am.  Aram Beecham B. Drue Second MD MPH [facility] [phone]

## 2019-12-20 NOTE — Progress Notes (Signed)
ANTICOAGULATION CONSULT NOTE - Initial Consult  Pharmacy Consult for heparin Indication: pulmonary embolus  Allergies  Allergen Reactions  . Ampicillin Diarrhea    Patient Measurements: Height: 5\' 9"  (175.3 cm) Weight: 140 lb 6.9 oz (63.7 kg) IBW/kg (Calculated) : 66.2 Heparin Dosing Weight: TBW  Vital Signs: Temp: 98.9 F (37.2 C) (03/01 0859) Temp Source: Oral (03/01 0859) BP: 92/61 (03/01 0859) Pulse Rate: 67 (03/01 0859)  Labs: Recent Labs    12/18/19 2230 12/18/19 2230 12/18/19 2241 12/19/19 0155 12/19/19 0155 12/19/19 1046 12/20/19 0325  HGB 10.7*   < > 10.5* 10.9*   < > 10.1* 9.3*  HCT 34.0*   < > 31.0* 35.2*  --  31.2* 28.3*  PLT 260   < >  --  279  --  239 201  LABPROT 14.5  --   --   --   --   --   --   INR 1.1  --   --   --   --   --   --   CREATININE 1.19*   < > 1.10* 0.77  --   --  0.79  CKTOTAL  --   --   --  370*  --   --   --   TROPONINIHS <2  --   --  14  --   --   --    < > = values in this interval not displayed.    Estimated Creatinine Clearance: 82.7 mL/min (by C-G formula based on SCr of 0.79 mg/dL).   Medical History: History reviewed. No pertinent past medical history.   Assessment: 10 YOF with small bilateral PE on CT, on lovenox DVT ppx dosing here will give small heparin bolus d/t last dose this AM.    Goal of Therapy:  Heparin level 0.3-0.7 units/ml Monitor platelets by anticoagulation protocol: Yes   Plan:  Heparin 2000 units IV x 1, and gtt at 1100 units/hr F/u 6 hour heparin level  45, PharmD Clinical Pharmacist Please check AMION for all [facility] numbers 12/20/2019 11:50 AM

## 2019-12-20 NOTE — Progress Notes (Signed)
Pola Corn MD entered orders.  Chavy [facility] RN

## 2019-12-20 NOTE — Progress Notes (Signed)
Noralee Stain DO was paged on amion to please see CT report

## 2019-12-20 NOTE — Consult Note (Signed)
Reason for Consult: Spinal cord injury Referring Physician: Medicine  Anna HazelLisa Campos is an 54 y.o. female.  HPI: 54 year old female who is admitted to hospital after a syncopal episode.  Has been found to be bacteremic with unknown source.  She has also been found to have a significant pulmonary embolism.  The patient has noted profound weakness since her fall.  She has some dysesthesias in both distal upper extremities.  Prior to the fall she had no history of significant weakness, neck pain or sensory loss.  History reviewed. No pertinent past medical history.  No past surgical history on file.  Family History  Problem Relation Age of Onset  . Breast cancer Mother     Social History:  has no history on file for tobacco, alcohol, and drug.  Allergies:  Allergies  Allergen Reactions  . Ampicillin Diarrhea    Medications: I have reviewed the patient's current medications.  Results for orders placed or performed during the hospital encounter of 12/18/19 (from the past 48 hour(s))  CDS serology     Status: None   Collection Time: 12/18/19 10:30 PM  Result Value Ref Range   CDS serology specimen      SPECIMEN WILL BE HELD FOR 14 DAYS IF TESTING IS REQUIRED    Comment: Performed at [facility], [address] [place], [place] [address]  Comprehensive metabolic panel     Status: Abnormal   Collection Time: 12/18/19 10:30 PM  Result Value Ref Range   Sodium 139 135 - 145 mmol/L   Potassium 3.3 (L) 3.5 - 5.1 mmol/L   Chloride 111 98 - 111 mmol/L   CO2 22 22 - 32 mmol/L   Glucose, Bld 130 (H) 70 - 99 mg/dL    Comment: Glucose reference range applies only to samples taken after fasting for at least 8 hours.   BUN 7 6 - 20 mg/dL   Creatinine, Ser 0.981.19 (H) 0.44 - 1.00 mg/dL   Calcium 8.2 (L) 8.9 - 10.3 mg/dL   Total Protein 5.6 (L) 6.5 - 8.1 g/dL   Albumin 3.1 (L) 3.5 - 5.0 g/dL   AST 19 15 - 41 U/L   ALT 11 0 - 44 U/L   Alkaline Phosphatase 130 (H) 38 - 126 U/L   Total  Bilirubin 0.6 0.3 - 1.2 mg/dL   GFR calc non Af Amer 52 (L) >60 mL/min   GFR calc Af Amer >60 >60 mL/min   Anion gap 6 5 - 15    Comment: Performed at [facility], [address] [place], [place] [address]  CBC     Status: Abnormal   Collection Time: 12/18/19 10:30 PM  Result Value Ref Range   WBC 7.9 4.0 - 10.5 K/uL   RBC 3.61 (L) 3.87 - 5.11 MIL/uL   Hemoglobin 10.7 (L) 12.0 - 15.0 g/dL   HCT 78.234.0 (L) 95.636.0 - 21.346.0 %   MCV 94.2 80.0 - 100.0 fL   MCH 29.6 26.0 - 34.0 pg   MCHC 31.5 30.0 - 36.0 g/dL   RDW 08.611.9 57.811.5 - 46.915.5 %   Platelets 260 150 - 400 K/uL   nRBC 0.0 0.0 - 0.2 %    Comment: Performed at [facility], [address] [place], [place] [address]  Ethanol     Status: None   Collection Time: 12/18/19 10:30 PM  Result Value Ref Range   Alcohol, Ethyl (B) <10 <10 mg/dL    Comment: (NOTE) Lowest detectable  limit for serum alcohol is 10 mg/dL. For medical purposes only. Performed at [facility], [address] [place], [place] [address]   Lactic acid, plasma     Status: None   Collection Time: 12/18/19 10:30 PM  Result Value Ref Range   Lactic Acid, Venous 1.8 0.5 - 1.9 mmol/L    Comment: Performed at [facility], [address] [place], [place] [address]  Protime-INR     Status: None   Collection Time: 12/18/19 10:30 PM  Result Value Ref Range   Prothrombin Time 14.5 11.4 - 15.2 seconds   INR 1.1 0.8 - 1.2    Comment: (NOTE) INR goal varies based on device and disease states. Performed at [facility], [address] [place], [place] [address]   Troponin I (High Sensitivity)     Status: None   Collection Time: 12/18/19 10:30 PM  Result Value Ref Range   Troponin I (High Sensitivity) <2 <18 ng/L    Comment: (NOTE) Elevated high sensitivity troponin I (hsTnI) values and significant  changes across serial measurements may suggest ACS but many other  chronic and acute conditions are known to elevate hsTnI results.  Refer to the  Links section for chest pain algorithms and additional  guidance. Performed at [facility], [address] [place], [place] [address]   Magnesium     Status: Abnormal   Collection Time: 12/18/19 10:30 PM  Result Value Ref Range   Magnesium 1.6 (L) 1.7 - 2.4 mg/dL    Comment: Performed at [facility], [address] [place], [place] [address]  Blood culture (routine x 2)     Status: Abnormal (Preliminary result)   Collection Time: 12/18/19 10:30 PM   Specimen: BLOOD LEFT ARM  Result Value Ref Range   Specimen Description BLOOD LEFT ARM    Special Requests      BOTTLES DRAWN AEROBIC AND ANAEROBIC Blood Culture results may not be optimal due to an excessive volume of blood received in culture bottles   Culture  Setup Time      GRAM POSITIVE COCCI IN CHAINS IN BOTH AEROBIC AND ANAEROBIC BOTTLES CRITICAL RESULT CALLED TO, READ BACK BY AND VERIFIED WITH: K. HURTH,PHARMD 2221 12/19/2019 T. TYSOR    Culture (A)     STREPTOCOCCUS CONSTELLATUS SUSCEPTIBILITIES TO FOLLOW Performed at [facility], [address] [place], [place] [address]    Report Status PENDING   Sample to Blood Bank     Status: None   Collection Time: 12/18/19 10:35 PM  Result Value Ref Range   Blood Bank Specimen SAMPLE AVAILABLE FOR TESTING    Sample Expiration      12/19/2019,2359 Performed at [facility], [address] [place], [place] [address]   I-Stat beta hCG blood, ED     Status: None   Collection Time: 12/18/19 10:39 PM  Result Value Ref Range   I-stat hCG, quantitative <5.0 <5 mIU/mL   Comment 3            Comment:   GEST. AGE      CONC.  (mIU/mL)   <=1 WEEK        5 - 50     2 WEEKS       50 - 500     3 WEEKS       100 - 10,000     4 WEEKS     1,000 - 30,000  FEMALE AND NON-PREGNANT FEMALE:     LESS THAN 5 mIU/mL   I-stat chem 8, ED     Status: Abnormal   Collection Time: 12/18/19 10:41 PM  Result Value Ref Range   Sodium 141 135 - 145 mmol/L   Potassium  3.3 (L) 3.5 - 5.1 mmol/L   Chloride 106 98 - 111 mmol/L   BUN 6 6 - 20 mg/dL   Creatinine, Ser 4.09 (H) 0.44 - 1.00 mg/dL   Glucose, Bld 811 (H) 70 - 99 mg/dL    Comment: Glucose reference range applies only to samples taken after fasting for at least 8 hours.   Calcium, Ion 1.12 (L) 1.15 - 1.40 mmol/L   TCO2 22 22 - 32 mmol/L   Hemoglobin 10.5 (L) 12.0 - 15.0 g/dL   HCT 91.4 (L) 78.2 - 95.6 %  Respiratory Panel by RT PCR (Flu A&B, Covid) - Nasopharyngeal Swab     Status: None   Collection Time: 12/18/19 11:05 PM   Specimen: Nasopharyngeal Swab  Result Value Ref Range   SARS Coronavirus 2 by RT PCR NEGATIVE NEGATIVE    Comment: (NOTE) SARS-CoV-2 target nucleic acids are NOT DETECTED. The SARS-CoV-2 RNA is generally detectable in upper respiratoy specimens during the acute phase of infection. The lowest concentration of SARS-CoV-2 viral copies this assay can detect is 131 copies/mL. A negative result does not preclude SARS-Cov-2 infection and should not be used as the sole basis for treatment or other patient management decisions. A negative result may occur with  improper specimen collection/handling, submission of specimen other than nasopharyngeal swab, presence of viral mutation(s) within the areas targeted by this assay, and inadequate number of viral copies (<131 copies/mL). A negative result must be combined with clinical observations, patient history, and epidemiological information. The expected result is Negative. Fact Sheet for Patients:  https://www.moore.com/ Fact Sheet for Healthcare Providers:  https://www.young.biz/ This test is not yet ap proved or cleared by the [place] FDA and  has been authorized for detection and/or diagnosis of SARS-CoV-2 by FDA under an Emergency Use Authorization (EUA). This EUA will remain  in effect (meaning this test can be used) for the duration of the COVID-19 declaration under Section  564(b)(1) of the Act, 21 U.S.C. section 360bbb-3(b)(1), unless the authorization is terminated or revoked sooner.    Influenza A by PCR NEGATIVE NEGATIVE   Influenza B by PCR NEGATIVE NEGATIVE    Comment: (NOTE) The Xpert Xpress SARS-CoV-2/FLU/RSV assay is intended as an aid in  the diagnosis of influenza from Nasopharyngeal swab specimens and  should not be used as a sole basis for treatment. Nasal washings and  aspirates are unacceptable for Xpert Xpress SARS-CoV-2/FLU/RSV  testing. Fact Sheet for Patients: https://www.moore.com/ Fact Sheet for Healthcare Providers: https://www.young.biz/ This test is not yet approved or cleared by the [place] FDA and  has been authorized for detection and/or diagnosis of SARS-CoV-2 by  FDA under an Emergency Use Authorization (EUA). This EUA will remain  in effect (meaning this test can be used) for the duration of the  Covid-19 declaration under Section 564(b)(1) of the Act, 21  U.S.C. section 360bbb-3(b)(1), unless the authorization is  terminated or revoked. Performed at [facility], [address], [place], [place] [address]   TSH     Status: None   Collection Time: 12/18/19 11:56 PM  Result Value Ref Range   TSH 2.352 0.350 - 4.500 uIU/mL    Comment: Performed by a 3rd Generation assay with  a functional sensitivity of <=0.01 uIU/mL. Performed at [facility], [address], [place], [place] [address]   Cortisol     Status: None   Collection Time: 12/19/19 12:09 AM  Result Value Ref Range   Cortisol, Plasma 23.5 ug/dL    Comment: (NOTE) AM    6.7 - 22.6 ug/dL PM   <35.4       ug/dL Performed at [facility], [address] [place], [place] [address]   Troponin I (High Sensitivity)     Status: None   Collection Time: 12/19/19  1:55 AM  Result Value Ref Range   Troponin I (High Sensitivity) 14 <18 ng/L    Comment: (NOTE) Elevated high sensitivity troponin I (hsTnI)  values and significant  changes across serial measurements may suggest ACS but many other  chronic and acute conditions are known to elevate hsTnI results.  Refer to the "Links" section for chest pain algorithms and additional  guidance. Performed at [facility], [address], [place], [place] [address]   CK     Status: Abnormal   Collection Time: 12/19/19  1:55 AM  Result Value Ref Range   Total CK 370 (H) 38 - 234 U/L    Comment: Performed at [facility], [address], [place], [place] [address]  HIV Antibody (routine testing w rflx)     Status: None   Collection Time: 12/19/19  1:55 AM  Result Value Ref Range   HIV Screen 4th Generation wRfx NON REACTIVE NON REACTIVE    Comment: Performed at [facility], [address] [place], [place] [address]  Lipase, blood     Status: None   Collection Time: 12/19/19  1:55 AM  Result Value Ref Range   Lipase 25 11 - 51 U/L    Comment: Performed at [facility], [address] [place], [place] [address]  CBC     Status: Abnormal   Collection Time: 12/19/19  1:55 AM  Result Value Ref Range   WBC 10.8 (H) 4.0 - 10.5 K/uL   RBC 3.63 (L) 3.87 - 5.11 MIL/uL   Hemoglobin 10.9 (L) 12.0 - 15.0 g/dL   HCT 38.4 (L) 66.5 - 99.3 %   MCV 97.0 80.0 - 100.0 fL   MCH 30.0 26.0 - 34.0 pg   MCHC 31.0 30.0 - 36.0 g/dL   RDW 57.0 17.7 - 93.9 %   Platelets 279 150 - 400 K/uL   nRBC 0.0 0.0 - 0.2 %    Comment: Performed at [facility], [address], [place], [place] [address]  Basic metabolic panel     Status: Abnormal   Collection Time: 12/19/19  1:55 AM  Result Value Ref Range   Sodium 140 135 - 145 mmol/L   Potassium 3.7 3.5 - 5.1 mmol/L   Chloride 114 (H) 98 - 111 mmol/L   CO2 19 (L) 22 - 32 mmol/L   Glucose, Bld 124 (H) 70 - 99 mg/dL    Comment: Glucose reference range applies only to samples taken after fasting for at least 8 hours.   BUN 5 (L) 6 - 20 mg/dL   Creatinine, Ser 2.33 0.44 - 1.00 mg/dL    Calcium 7.8 (L) 8.9 - 10.3 mg/dL   GFR calc non Af Amer >60 >60 mL/min   GFR calc Af Amer >60 >60 mL/min   Anion gap 7 5 - 15    Comment: Performed at  [facility], [address], [place], [place] [address]  Magnesium     Status: Abnormal   Collection Time: 12/19/19  1:55 AM  Result Value Ref Range   Magnesium 1.4 (L) 1.7 - 2.4 mg/dL    Comment: Performed at [facility], [address] [place], [place] [address]  Phosphorus     Status: Abnormal   Collection Time: 12/19/19  1:55 AM  Result Value Ref Range   Phosphorus 1.9 (L) 2.5 - 4.6 mg/dL    Comment: Performed at [facility], [address] [place], [place] [address]  Aldolase     Status: None   Collection Time: 12/19/19  1:55 AM  Result Value Ref Range   Aldolase 8.1 3.3 - 10.3 U/L    Comment: (NOTE) Performed At: [facility] [address] [place], [place] [id number] Jolene Schimke MD OZ:[id number]   Lactate dehydrogenase     Status: None   Collection Time: 12/19/19  1:55 AM  Result Value Ref Range   LDH 166 98 - 192 U/L    Comment: Performed at [facility], [address] [place], [place] [address]  Hemoglobin A1c     Status: None   Collection Time: 12/19/19  1:55 AM  Result Value Ref Range   Hgb A1c MFr Bld 5.0 4.8 - 5.6 %    Comment: (NOTE) Pre diabetes:          5.7%-6.4% Diabetes:              >6.4% Glycemic control for   <7.0% adults with diabetes    Mean Plasma Glucose 96.8 mg/dL    Comment: Performed at [facility], [address], [place], [place] [address]  Blood culture (routine x 2)     Status: None (Preliminary result)   Collection Time: 12/19/19  3:31 AM   Specimen: BLOOD RIGHT WRIST  Result Value Ref Range   Specimen Description BLOOD RIGHT WRIST    Special Requests      BOTTLES DRAWN AEROBIC AND ANAEROBIC Blood Culture adequate volume   Culture      NO GROWTH 1 DAY Performed at [facility], [address] [place], [place] [address]    Report  Status PENDING   MRSA PCR Screening     Status: None   Collection Time: 12/19/19  6:16 AM   Specimen: Nasal Mucosa; Nasopharyngeal  Result Value Ref Range   MRSA by PCR NEGATIVE NEGATIVE    Comment:        The GeneXpert MRSA Assay (FDA approved for NASAL specimens only), is one component of a comprehensive MRSA colonization surveillance program. It is not intended to diagnose MRSA infection nor to guide or monitor treatment for MRSA infections. Performed at [facility], [address] [place], [place] [address]   Glucose, capillary     Status: Abnormal   Collection Time: 12/19/19  6:17 AM  Result Value Ref Range   Glucose-Capillary 113 (H) 70 - 99 mg/dL    Comment: Glucose reference range applies only to samples taken after fasting for at least 8 hours.   Comment 1 Notify RN   Urinalysis, Routine w reflex microscopic     Status: Abnormal   Collection Time: 12/19/19  9:23 AM  Result Value Ref Range   Color, Urine YELLOW YELLOW   APPearance HAZY (A) CLEAR   Specific Gravity, Urine 1.045 (H) 1.005 - 1.030   pH 5.0 5.0 -  8.0   Glucose, UA NEGATIVE NEGATIVE mg/dL   Hgb urine dipstick SMALL (A) NEGATIVE   Bilirubin Urine NEGATIVE NEGATIVE   Ketones, ur NEGATIVE NEGATIVE mg/dL   Protein, ur NEGATIVE NEGATIVE mg/dL   Nitrite NEGATIVE NEGATIVE   Leukocytes,Ua NEGATIVE NEGATIVE   RBC / HPF 0-5 0 - 5 RBC/hpf   WBC, UA 0-5 0 - 5 WBC/hpf   Bacteria, UA NONE SEEN NONE SEEN   Squamous Epithelial / LPF 0-5 0 - 5    Comment: Performed at [facility], [address] [place], [place] [address]  Rapid urine drug screen (hospital performed)     Status: Abnormal   Collection Time: 12/19/19  9:23 AM  Result Value Ref Range   Opiates NONE DETECTED NONE DETECTED   Cocaine NONE DETECTED NONE DETECTED   Benzodiazepines NONE DETECTED NONE DETECTED   Amphetamines NONE DETECTED NONE DETECTED   Tetrahydrocannabinol POSITIVE (A) NONE DETECTED   Barbiturates NONE DETECTED NONE  DETECTED    Comment: (NOTE) DRUG SCREEN FOR MEDICAL PURPOSES ONLY.  IF CONFIRMATION IS NEEDED FOR ANY PURPOSE, NOTIFY LAB WITHIN 5 DAYS. LOWEST DETECTABLE LIMITS FOR URINE DRUG SCREEN Drug Class                     Cutoff (ng/mL) Amphetamine and metabolites    1000 Barbiturate and metabolites    200 Benzodiazepine                 200 Tricyclics and metabolites     300 Opiates and metabolites        300 Cocaine and metabolites        300 THC                            50 Performed at [facility], [address], [place], [place] [address]   Sodium, urine, random     Status: None   Collection Time: 12/19/19  9:23 AM  Result Value Ref Range   Sodium, Ur 132 mmol/L    Comment: Performed at [facility], [address] [place], [place] [address]  Creatinine, urine, random     Status: None   Collection Time: 12/19/19  9:23 AM  Result Value Ref Range   Creatinine, Urine 90.49 mg/dL    Comment: Performed at [facility], [address], [place], [place] [address]  CBC with Differential/Platelet     Status: Abnormal   Collection Time: 12/19/19 10:46 AM  Result Value Ref Range   WBC 15.2 (H) 4.0 - 10.5 K/uL   RBC 3.37 (L) 3.87 - 5.11 MIL/uL   Hemoglobin 10.1 (L) 12.0 - 15.0 g/dL   HCT 92.1 (L) 19.4 - 17.4 %   MCV 92.6 80.0 - 100.0 fL   MCH 30.0 26.0 - 34.0 pg   MCHC 32.4 30.0 - 36.0 g/dL   RDW 08.1 44.8 - 18.5 %   Platelets 239 150 - 400 K/uL   nRBC 0.0 0.0 - 0.2 %   Neutrophils Relative % 89 %   Neutro Abs 13.7 (H) 1.7 - 7.7 K/uL   Lymphocytes Relative 5 %   Lymphs Abs 0.7 0.7 - 4.0 K/uL   Monocytes Relative 5 %   Monocytes Absolute 0.7 0.1 - 1.0 K/uL   Eosinophils Relative 0 %   Eosinophils Absolute 0.0 0.0 - 0.5 K/uL   Basophils Relative 0 %   Basophils Absolute 0.0  0.0 - 0.1 K/uL   Immature Granulocytes 1 %   Abs Immature Granulocytes 0.07 0.00 - 0.07 K/uL    Comment: Performed at [facility], [address] [place], [place] [address]   Basic metabolic panel     Status: Abnormal   Collection Time: 12/20/19  3:25 AM  Result Value Ref Range   Sodium 140 135 - 145 mmol/L   Potassium 3.7 3.5 - 5.1 mmol/L   Chloride 115 (H) 98 - 111 mmol/L   CO2 21 (L) 22 - 32 mmol/L   Glucose, Bld 91 70 - 99 mg/dL    Comment: Glucose reference range applies only to samples taken after fasting for at least 8 hours.   BUN <5 (L) 6 - 20 mg/dL   Creatinine, Ser 0.34 0.44 - 1.00 mg/dL   Calcium 8.0 (L) 8.9 - 10.3 mg/dL   GFR calc non Af Amer >60 >60 mL/min   GFR calc Af Amer >60 >60 mL/min   Anion gap 4 (L) 5 - 15    Comment: Performed at [facility], [address] [place], [place] [address]  CBC     Status: Abnormal   Collection Time: 12/20/19  3:25 AM  Result Value Ref Range   WBC 8.4 4.0 - 10.5 K/uL   RBC 3.06 (L) 3.87 - 5.11 MIL/uL   Hemoglobin 9.3 (L) 12.0 - 15.0 g/dL   HCT 56.3 (L) 87.5 - 64.3 %   MCV 92.5 80.0 - 100.0 fL   MCH 30.4 26.0 - 34.0 pg   MCHC 32.9 30.0 - 36.0 g/dL   RDW 32.9 51.8 - 84.1 %   Platelets 201 150 - 400 K/uL   nRBC 0.0 0.0 - 0.2 %    Comment: Performed at [facility], [address], [place], [place] [address]  Magnesium     Status: None   Collection Time: 12/20/19  3:25 AM  Result Value Ref Range   Magnesium 2.3 1.7 - 2.4 mg/dL    Comment: Performed at [facility], [address] [place], [place] [address]  D-dimer, quantitative (not at [facility])     Status: Abnormal   Collection Time: 12/20/19  7:31 AM  Result Value Ref Range   D-Dimer, Quant 1.74 (H) 0.00 - 0.50 ug/mL-FEU    Comment: (NOTE) At the manufacturer cut-off of 0.50 ug/mL FEU, this assay has been documented to exclude PE with a sensitivity and negative predictive value of 97 to 99%.  At this time, this assay has not been approved by the FDA to exclude DVT/VTE. Results should be correlated with clinical presentation. Performed at [facility], [address] [place], [place] [address]   Glucose, capillary      Status: None   Collection Time: 12/20/19 11:41 AM  Result Value Ref Range   Glucose-Capillary 92 70 - 99 mg/dL    Comment: Glucose reference range applies only to samples taken after fasting for at least 8 hours.  Heparin level (unfractionated)     Status: None   Collection Time: 12/20/19  6:50 PM  Result Value Ref Range   Heparin Unfractionated 0.43 0.30 - 0.70 IU/mL    Comment: (NOTE) If heparin results are below expected values, and patient dosage has  been confirmed, suggest follow up testing of antithrombin III levels. Performed at [facility], [address], [place], [place] [address]     CT Head Wo Contrast  Result Date: 12/18/2019 CLINICAL  DATA:  Syncope, somnolent EXAM: CT HEAD WITHOUT CONTRAST TECHNIQUE: Contiguous axial images were obtained from the base of the skull through the vertex without intravenous contrast. COMPARISON:  None. FINDINGS: Brain: No acute infarct or hemorrhage. Lateral ventricles and midline structures are unremarkable. No acute extra-axial fluid collections. No mass effect. Vascular: No hyperdense vessel or unexpected calcification. Skull: Normal. Negative for fracture or focal lesion. Sinuses/Orbits: No acute finding. Other: None IMPRESSION: 1. No acute intracranial process. Electronically Signed   By: Sharlet Salina M.D.   On: 12/18/2019 23:22   CT Chest W Contrast  Result Date: 12/18/2019 CLINICAL DATA:  Chest trauma. EXAM: CT CHEST, ABDOMEN, AND PELVIS WITH CONTRAST TECHNIQUE: Multidetector CT imaging of the chest, abdomen and pelvis was performed following the standard protocol during bolus administration of intravenous contrast. CONTRAST:  OMNIPAQUE IOHEXOL 350 MG/ML SOLN COMPARISON:  None. FINDINGS: CT CHEST FINDINGS Cardiovascular: There is no large centrally located pulmonary embolism. No evidence for a thoracic aortic aneurysm or dissection. The visualized arch vessels are grossly patent. Minimal atherosclerotic changes are noted of the  thoracic aorta. Mediastinum/Nodes: --No mediastinal or hilar lymphadenopathy. --No axillary lymphadenopathy. --No supraclavicular lymphadenopathy. --Normal thyroid gland. --The esophagus is unremarkable Lungs/Pleura: No pulmonary nodules or masses. No pleural effusion or pneumothorax. No focal airspace consolidation. No focal pleural abnormality. Musculoskeletal: No chest wall abnormality. No acute or significant osseous findings. CT ABDOMEN PELVIS FINDINGS Hepatobiliary: The liver is normal. Normal gallbladder.There is no biliary ductal dilation. Pancreas: Normal contours without ductal dilatation. No peripancreatic fluid collection. Spleen: No splenic laceration or hematoma. Adrenals/Urinary Tract: --Adrenal glands: No adrenal hemorrhage. --Right kidney/ureter: No hydronephrosis or perinephric hematoma. --Left kidney/ureter: No hydronephrosis or perinephric hematoma. --Urinary bladder: Unremarkable. Stomach/Bowel: --Stomach/Duodenum: No hiatal hernia or other gastric abnormality. Normal duodenal course and caliber. --Small bowel: No dilatation or inflammation. --Colon: No focal abnormality. --Appendix: Normal. Vascular/Lymphatic: Normal course and caliber of the major abdominal vessels. --No retroperitoneal lymphadenopathy. --No mesenteric lymphadenopathy. --No pelvic or inguinal lymphadenopathy. Reproductive: Unremarkable Other: No ascites or free air. The abdominal wall is normal. Musculoskeletal. No acute displaced fractures. IMPRESSION: 1. No acute finding of the chest, abdomen or pelvis. 2. Minimal atherosclerotic changes of the thoracic aorta. Electronically Signed   By: Katherine Mantle M.D.   On: 12/18/2019 23:25   CT ANGIO CHEST PE W OR WO CONTRAST  Result Date: 12/20/2019 CLINICAL DATA:  54 year old female with history of syncope. Elevated D-dimer. Chest pain. EXAM: CT ANGIOGRAPHY CHEST WITH CONTRAST TECHNIQUE: Multidetector CT imaging of the chest was performed using the standard protocol during  bolus administration of intravenous contrast. Multiplanar CT image reconstructions and MIPs were obtained to evaluate the vascular anatomy. CONTRAST:  37mL OMNIPAQUE IOHEXOL 350 MG/ML SOLN COMPARISON:  Chest CT 12/18/2019. FINDINGS: Cardiovascular: There are small filling defects within the pulmonary arteries bilaterally involving segmental sized branches in the left lower lobe and subsegmental sized branches in the right lower lobe. These are nonocclusive. No larger central or lobar sized filling defects are noted. Heart size is normal. There is no significant pericardial fluid, thickening or pericardial calcification. There is aortic atherosclerosis, as well as atherosclerosis of the great vessels of the mediastinum and the coronary arteries, including calcified atherosclerotic plaque in the left anterior descending coronary artery. Mediastinum/Nodes: No pathologically enlarged mediastinal or hilar lymph nodes. Esophagus is unremarkable in appearance. No axillary lymphadenopathy. Lungs/Pleura: Dependent areas of subsegmental atelectasis are noted in lower lobes of the lungs bilaterally. No acute consolidative airspace disease. No pleural effusions. Upper Abdomen: Intermediate  attenuation material lying dependently in the gallbladder, presumably biliary sludge. Musculoskeletal: There are no aggressive appearing lytic or blastic lesions noted in the visualized portions of the skeleton. Review of the MIP images confirms the above findings. IMPRESSION: 1. Small nonocclusive pulmonary emboli bilaterally involving segmental and subsegmental sized vessels, as above. 2. Aortic atherosclerosis, in addition to left anterior descending coronary artery disease. Please note that although the presence of coronary artery calcium documents the presence of coronary artery disease, the severity of this disease and any potential stenosis cannot be assessed on this non-gated CT examination. Assessment for potential risk factor  modification, dietary therapy or pharmacologic therapy may be warranted, if clinically indicated. 3. Biliary sludge in the gallbladder. These results will be called to the ordering clinician or representative by the Radiologist Assistant, and communication documented in the PACS or zVision Dashboard. Aortic Atherosclerosis (ICD10-I70.0). Electronically Signed   By: Trudie Reed M.D.   On: 12/20/2019 10:53   CT Cervical Spine Wo Contrast  Result Date: 12/18/2019 CLINICAL DATA:  Syncope, somnolent EXAM: CT CERVICAL SPINE WITHOUT CONTRAST TECHNIQUE: Multidetector CT imaging of the cervical spine was performed without intravenous contrast. Multiplanar CT image reconstructions were also generated. COMPARISON:  None. FINDINGS: Alignment: Alignment is anatomic. Skull base and vertebrae: No acute displaced fractures. Soft tissues and spinal canal: No prevertebral fluid or swelling. No visible canal hematoma. Disc levels: There is extensive multilevel cervical spondylosis with disc space narrowing and circumferential osteophyte formation seen at C3-4, C4-5, C5-6, and C6-7. There is symmetrical neural foraminal encroachment at these levels. Upper chest: Airway is patent.  Lung apices are clear. Other: Reconstructed images demonstrate no additional findings. IMPRESSION: 1. Extensive multilevel cervical spondylosis.  No acute fracture. Electronically Signed   By: Sharlet Salina M.D.   On: 12/18/2019 23:23   MR BRAIN WO CONTRAST  Result Date: 12/20/2019 CLINICAL DATA:  Neuro deficit, subacute. Additional history provided: Syncope, generalized fatigue, poor p.o. intake. EXAM: MRI HEAD WITHOUT CONTRAST TECHNIQUE: Multiplanar, multiecho pulse sequences of the brain and surrounding structures were obtained without intravenous contrast. COMPARISON:  Head CT 12/18/2019. FINDINGS: Brain: The examination is intermittently motion degraded. Most notably there is mild-to-moderate motion degradation of the axial T2 FLAIR sequence  and moderate/severe motion degradation of the coronal T2 weighted sequence. There is no evidence of acute infarct. No evidence of intracranial mass. No midline shift or extra-axial fluid collection. No chronic intracranial blood products. Mild scattered T2/FLAIR hyperintensity within the cerebral white matter is nonspecific, but most commonly seen on the basis of chronic small vessel ischemic disease. Mild generalized parenchymal atrophy. Incidentally noted cavum septum pellucidum and cavum vergae. Vascular: Visualized orbits demonstrate no acute abnormality. Skull and upper cervical spine: No focal marrow lesion. Sinuses/Orbits: Visualized orbits demonstrate no acute abnormality. Mild scattered paranasal sinus mucosal thickening greatest within bilateral ethmoid air cells. No significant mastoid effusion. IMPRESSION: 1. Intermittently motion degraded exam. 2. No evidence of acute intracranial abnormality. 3. Mild scattered T2 hyperintense signal changes within the cerebral white matter are nonspecific, but most commonly seen on the basis of chronic small vessel ischemic disease. 4. Mild generalized parenchymal atrophy. 5. Mild paranasal sinus mucosal thickening. Electronically Signed   By: Jackey Loge DO   On: 12/20/2019 16:57   MR CERVICAL SPINE WO CONTRAST  Result Date: 12/20/2019 CLINICAL DATA:  Spinal stenosis, cervical spine. EXAM: MRI CERVICAL SPINE WITHOUT CONTRAST TECHNIQUE: Multiplanar, multisequence MR imaging of the cervical spine was performed. No intravenous contrast was administered. COMPARISON:  CT cervical spine  12/18/2019. FINDINGS: Mildly motion degraded examination Alignment: Mild reversal of the expected cervical lordosis. No significant spondylolisthesis. Vertebrae: Vertebral body height is maintained. No significant marrow edema or suspicious osseous lesion. Multilevel degenerative endplate irregularity and mixed degenerative endplate marrow signal. This includes mild degenerative endplate  edema at C4-C5. Cord: There is prominent T2/STIR hyperintense cord signal abnormality at the C4 through C6 levels. The cord signal abnormality involves the entire cord at the lower C5 level. Posterior Fossa, vertebral arteries, paraspinal tissues: No abnormality identified within included portions of the posterior fossa. Flow voids preserved within the cervical vertebral arteries. There is STIR hyperintense signal within the interspinous spaces at the C3-C4 through T1-T2 levels. Disc levels: Moderate/severe C3-C4 disc degeneration. Moderate disc degeneration at the remaining cervical levels. C2-C3: No disc herniation. No significant canal or foraminal stenosis. C3-C4: Posterior disc osteophyte complex. Uncinate/facet hypertrophy. Mild spinal canal stenosis. There is contact upon the ventral spinal cord with minimal flattening of the ventral cord. Bilateral neural foraminal narrowing (moderate right, moderate/severe left). C4-C5: Posterior disc osteophyte complex. Uncinate/facet hypertrophy. Severe spinal canal stenosis with mild spinal cord flattening. Bilateral neural foraminal narrowing (moderate right, severe left). C5-C6: Posterior disc osteophyte complex. Uncinate/facet hypertrophy. Mild spinal canal stenosis. Severe bilateral neural foraminal narrowing. C6-C7: Posterior disc osteophyte complex. Uncinate/facet hypertrophy. Mild spinal canal stenosis. Severe bilateral neural foraminal narrowing. C7-T1: No disc herniation. No significant canal or foraminal stenosis. IMPRESSION: Prominent spinal cord signal abnormality at the C4 through C6 levels. There is involvement of the entire cord cross-section at the lower C5 level. Differential considerations include spinal cord infarct, contusion, demyelinating disease or other inflammatory/infectious process. Edema signal within the interspinous spaces at the C3-T2 levels, which may reflect interspinous ligament injury. Cervical spondylosis as described and most notably  as follows. At C4-C5, a posterior disc osteophyte complex contributes to severe spinal canal stenosis with mild spinal cord flattening. Bilateral neural foraminal narrowing (moderate right, severe left) No more than mild spinal canal stenosis at the remaining levels. Additional sites of neural foraminal narrowing, including site of severe and moderate/severe neural foraminal narrowing as detailed. Electronically Signed   By: Jackey Loge DO   On: 12/20/2019 17:32   MR THORACIC SPINE WO CONTRAST  Result Date: 12/20/2019 CLINICAL DATA:  Distal greater than proximal weakness, trauma/fall at home prior to admission. EXAM: MRI THORACIC SPINE WITHOUT CONTRAST TECHNIQUE: Multiplanar, multisequence MR imaging of the thoracic spine was performed. No intravenous contrast was administered. COMPARISON:  CT chest 12/18/2019 FINDINGS: Mildly motion degraded examination. Alignment:  Alignment is maintained. Vertebrae: Vertebral body height is maintained. No significant marrow edema or suspicious osseous lesion. Cord:  No spinal cord signal abnormality is identified. Paraspinal and other soft tissues: Mild nonspecific edema within the dorsal subcutaneous soft tissues overlying the lower thoracic and visualized upper lumbar spine. No abnormality identified within included portions of the thorax or upper abdomen. Disc levels: Mild disc degeneration throughout the thoracic spine. Mild multilevel disc bulges and facet arthrosis/ligamentum flavum hypertrophy. No significant spinal canal stenosis at any level. No compressive foraminal stenosis. IMPRESSION: Mildly motion degraded examination. No spinal cord signal abnormality is identified. Mild thoracic spondylosis with no significant spinal canal stenosis, and no compressive foraminal narrowing. Electronically Signed   By: Jackey Loge DO   On: 12/20/2019 17:59   CT ABDOMEN PELVIS W CONTRAST  Result Date: 12/18/2019 CLINICAL DATA:  Chest trauma. EXAM: CT CHEST, ABDOMEN, AND  PELVIS WITH CONTRAST TECHNIQUE: Multidetector CT imaging of the chest, abdomen and pelvis was performed  following the standard protocol during bolus administration of intravenous contrast. CONTRAST:  OMNIPAQUE IOHEXOL 350 MG/ML SOLN COMPARISON:  None. FINDINGS: CT CHEST FINDINGS Cardiovascular: There is no large centrally located pulmonary embolism. No evidence for a thoracic aortic aneurysm or dissection. The visualized arch vessels are grossly patent. Minimal atherosclerotic changes are noted of the thoracic aorta. Mediastinum/Nodes: --No mediastinal or hilar lymphadenopathy. --No axillary lymphadenopathy. --No supraclavicular lymphadenopathy. --Normal thyroid gland. --The esophagus is unremarkable Lungs/Pleura: No pulmonary nodules or masses. No pleural effusion or pneumothorax. No focal airspace consolidation. No focal pleural abnormality. Musculoskeletal: No chest wall abnormality. No acute or significant osseous findings. CT ABDOMEN PELVIS FINDINGS Hepatobiliary: The liver is normal. Normal gallbladder.There is no biliary ductal dilation. Pancreas: Normal contours without ductal dilatation. No peripancreatic fluid collection. Spleen: No splenic laceration or hematoma. Adrenals/Urinary Tract: --Adrenal glands: No adrenal hemorrhage. --Right kidney/ureter: No hydronephrosis or perinephric hematoma. --Left kidney/ureter: No hydronephrosis or perinephric hematoma. --Urinary bladder: Unremarkable. Stomach/Bowel: --Stomach/Duodenum: No hiatal hernia or other gastric abnormality. Normal duodenal course and caliber. --Small bowel: No dilatation or inflammation. --Colon: No focal abnormality. --Appendix: Normal. Vascular/Lymphatic: Normal course and caliber of the major abdominal vessels. --No retroperitoneal lymphadenopathy. --No mesenteric lymphadenopathy. --No pelvic or inguinal lymphadenopathy. Reproductive: Unremarkable Other: No ascites or free air. The abdominal wall is normal. Musculoskeletal. No acute  displaced fractures. IMPRESSION: 1. No acute finding of the chest, abdomen or pelvis. 2. Minimal atherosclerotic changes of the thoracic aorta. Electronically Signed   By: Katherine Mantle M.D.   On: 12/18/2019 23:25   DG Pelvis Portable  Result Date: 12/18/2019 CLINICAL DATA:  Syncope, fell EXAM: PORTABLE PELVIS 1-2 VIEWS COMPARISON:  None. FINDINGS: Supine frontal view of the pelvis was performed. There are no acute displaced fractures. The hips are well aligned. Joint spaces are well preserved. Sacroiliac joints are normal. IMPRESSION: 1. No acute displaced fracture. Electronically Signed   By: Sharlet Salina M.D.   On: 12/18/2019 22:47   DG Chest Portable 1 View  Result Date: 12/18/2019 CLINICAL DATA:  Syncope, fell EXAM: PORTABLE CHEST 1 VIEW COMPARISON:  None. FINDINGS: The heart size and mediastinal contours are within normal limits. Both lungs are clear. The visualized skeletal structures are unremarkable. IMPRESSION: No active disease. Electronically Signed   By: Sharlet Salina M.D.   On: 12/18/2019 22:47   ECHOCARDIOGRAM COMPLETE  Result Date: 12/19/2019    ECHOCARDIOGRAM REPORT   Patient Name:   SARAH-JANE PLUIM Date of Exam: 12/19/2019 Medical Rec #:  [id number]   Height:       67.0 in Accession #:    [id number]  Weight:       131.4 lb Date of Birth:  08/03/1966  BSA:          1.692 m Patient Age:    53 years    BP:           90/50 mmHg Patient Gender: F           HR:           77 bpm. Exam Location:  Inpatient Procedure: 2D Echo Indications:    syncope 780.2  History:        Patient has no prior history of Echocardiogram examinations.  Sonographer:    Delcie Roch Referring Phys: [id number] JENNIFER T KRALL IMPRESSIONS  1. Left ventricular ejection fraction, by estimation, is 60 to 65%. The left ventricle has normal function. The left ventricle has no regional wall motion abnormalities. Left ventricular diastolic parameters were normal.  2.  Right ventricular systolic function is normal. The  right ventricular size is normal. Tricuspid regurgitation signal is inadequate for assessing PA pressure.  3. The mitral valve is normal in structure and function. No evidence of mitral valve regurgitation. No evidence of mitral stenosis.  4. The aortic valve is tricuspid. Aortic valve regurgitation is not visualized. Mild aortic valve sclerosis is present, with no evidence of aortic valve stenosis.  5. The inferior vena cava is dilated in size with >50% respiratory variability, suggesting right atrial pressure of 8 mmHg. FINDINGS  Left Ventricle: Left ventricular ejection fraction, by estimation, is 60 to 65%. The left ventricle has normal function. The left ventricle has no regional wall motion abnormalities. The left ventricular internal cavity size was normal in size. There is  no left ventricular hypertrophy. Left ventricular diastolic parameters were normal. Normal left ventricular filling pressure. Right Ventricle: The right ventricular size is normal. No increase in right ventricular wall thickness. Right ventricular systolic function is normal. Tricuspid regurgitation signal is inadequate for assessing PA pressure. Left Atrium: Left atrial size was normal in size. Right Atrium: Right atrial size was normal in size. Pericardium: There is no evidence of pericardial effusion. Mitral Valve: The mitral valve is normal in structure and function. Normal mobility of the mitral valve leaflets. No evidence of mitral valve regurgitation. No evidence of mitral valve stenosis. Tricuspid Valve: The tricuspid valve is normal in structure. Tricuspid valve regurgitation is mild . No evidence of tricuspid stenosis. Aortic Valve: The aortic valve is tricuspid. Aortic valve regurgitation is not visualized. Mild aortic valve sclerosis is present, with no evidence of aortic valve stenosis. Pulmonic Valve: The pulmonic valve was normal in structure. Pulmonic valve regurgitation is not visualized. No evidence of pulmonic stenosis.  Aorta: The aortic root is normal in size and structure. Venous: The inferior vena cava is dilated in size with greater than 50% respiratory variability, suggesting right atrial pressure of 8 mmHg. IAS/Shunts: No atrial level shunt detected by color flow Doppler.  LEFT VENTRICLE PLAX 2D LVIDd:         4.30 cm  Diastology LVIDs:         2.80 cm  LV e' lateral:   12.10 cm/s LV PW:         0.90 cm  LV E/e' lateral: 6.6 LV IVS:        0.90 cm  LV e' medial:    11.30 cm/s LVOT diam:     1.80 cm  LV E/e' medial:  7.0 LV SV:         73 LV SV Index:   43 LVOT Area:     2.54 cm  RIGHT VENTRICLE RV S prime:     15.60 cm/s TAPSE (M-mode): 2.2 cm LEFT ATRIUM             Index       RIGHT ATRIUM          Index LA diam:        2.80 cm 1.66 cm/m  RA Area:     8.61 cm LA Vol (A2C):   27.4 ml 16.20 ml/m RA Volume:   16.80 ml 9.93 ml/m LA Vol (A4C):   27.9 ml 16.49 ml/m LA Biplane Vol: 27.7 ml 16.38 ml/m  AORTIC VALVE LVOT Vmax:   144.00 cm/s LVOT Vmean:  93.300 cm/s LVOT VTI:    0.285 m  AORTA Ao Root diam: 3.20 cm MITRAL VALVE MV Area (PHT): 3.42 cm    SHUNTS MV  Decel Time: 222 msec    Systemic VTI:  0.29 m MV E velocity: 79.30 cm/s  Systemic Diam: 1.80 cm MV A velocity: 89.10 cm/s MV E/A ratio:  0.89 Armanda Magic MD Electronically signed by Armanda Magic MD Signature Date/Time: 12/19/2019/10:44:12 AM    Final    VAS [place] LOWER EXTREMITY VENOUS (DVT)  Result Date: 12/20/2019  Lower Venous DVTStudy Indications: Pulmonary embolism.  Anticoagulation: Heparin. Comparison Study: No prior exam. Performing Technologist: Kennedy Bucker ARDMS, RVT  Examination Guidelines: A complete evaluation includes B-mode imaging, spectral Doppler, color Doppler, and power Doppler as needed of all accessible portions of each vessel. Bilateral testing is considered an integral part of a complete examination. Limited examinations for reoccurring indications may be performed as noted. The reflux portion of the exam is performed with the patient in  reverse Trendelenburg.  +---------+---------------+---------+-----------+----------+--------------+ RIGHT    CompressibilityPhasicitySpontaneityPropertiesThrombus Aging +---------+---------------+---------+-----------+----------+--------------+ CFV      Full           Yes      Yes                                 +---------+---------------+---------+-----------+----------+--------------+ SFJ      Full                                                        +---------+---------------+---------+-----------+----------+--------------+ FV Prox  Full                                                        +---------+---------------+---------+-----------+----------+--------------+ FV Mid   Full                                                        +---------+---------------+---------+-----------+----------+--------------+ FV DistalFull                                                        +---------+---------------+---------+-----------+----------+--------------+ PFV      Full                                                        +---------+---------------+---------+-----------+----------+--------------+ POP      Full           Yes      Yes                                 +---------+---------------+---------+-----------+----------+--------------+ PTV      Full                                                        +---------+---------------+---------+-----------+----------+--------------+  PERO     Full                                                        +---------+---------------+---------+-----------+----------+--------------+   +---------+---------------+---------+-----------+----------+--------------+ LEFT     CompressibilityPhasicitySpontaneityPropertiesThrombus Aging +---------+---------------+---------+-----------+----------+--------------+ CFV      Full           Yes      Yes                                  +---------+---------------+---------+-----------+----------+--------------+ SFJ      Full                                                        +---------+---------------+---------+-----------+----------+--------------+ FV Prox  Full                                                        +---------+---------------+---------+-----------+----------+--------------+ FV Mid   Full                                                        +---------+---------------+---------+-----------+----------+--------------+ FV DistalFull                                                        +---------+---------------+---------+-----------+----------+--------------+ PFV      Full                                                        +---------+---------------+---------+-----------+----------+--------------+ POP      Full           Yes      Yes                                 +---------+---------------+---------+-----------+----------+--------------+ PTV      Full                                                        +---------+---------------+---------+-----------+----------+--------------+ PERO     Full                                                        +---------+---------------+---------+-----------+----------+--------------+  Summary: BILATERAL: - No evidence of deep vein thrombosis seen in the lower extremities, bilaterally.  RIGHT: - No cystic structure found in the popliteal fossa.  LEFT: - No cystic structure found in the popliteal fossa.  *See table(s) above for measurements and observations. Electronically signed by Fabienne Bruns MD on 12/20/2019 at 5:54:34 PM.    Final    CT Maxillofacial Wo Contrast  Result Date: 12/18/2019 CLINICAL DATA:  Facial trauma, syncope, somnolent EXAM: CT MAXILLOFACIAL WITHOUT CONTRAST TECHNIQUE: Multidetector CT imaging of the maxillofacial structures was performed. Multiplanar CT image reconstructions were also generated.  COMPARISON:  None. FINDINGS: Osseous: No fracture or mandibular dislocation. No destructive process. Orbits: Negative. No traumatic or inflammatory finding. Sinuses: Clear. Soft tissues: Negative. Limited intracranial: No significant or unexpected finding. IMPRESSION: 1. No acute facial bone fracture. Electronically Signed   By: Sharlet Salina M.D.   On: 12/18/2019 23:26    Pertinent items noted in HPI and remainder of comprehensive ROS otherwise negative. Blood pressure 92/61, pulse 67, temperature 98.9 F (37.2 C), temperature source Oral, resp. rate 18, height 5\' 9"  (1.753 m), weight 63.7 kg, SpO2 100 %. Patient is awake and alert.  She is oriented and appropriate.  She is moderately hypertensive.  Heart rate is normal.  Cranial nerve function normal bilaterally.  Motor examination extremities reveal full 5 weakness in both deltoid muscle groups.  She has 3/5 weakness in bilateral biceps muscle groups.  She has 1-2/5 weakness in her arm wrist extensors and her triceps muscles bilaterally.  She has 1/5 grip strength and intrinsics.  In both hands.  She has 2/5 strength in both lower extremities with increased tone.  She has dysesthetic pain in both hands.  She is hyperreflexic.  There is some clonus in both ankles.  There is no cervical tenderness or obvious bony abnormality.  Assessment/Plan: The patient has suffered a significant incomplete spinal cord injury secondary to her fall superimposed upon significant degenerative disease with stenosis most prominently at the C4-5 level but also significantly at the C3-4 and C5-6 levels.  Her situation was complicated by significant hypotension which worsened her injury.  Currently her situation is complicated by bacteremia and a recently discovered pulmonary embolus requiring therapeutic anticoagulation.  I recommend continued antibiotic treatment as well as treatment for her shock.  Her spinal stenosis is not critical but would likely benefit from  multilevel cervical decompressive surgery.  I would not consider surgery until her medical situation has cleared and I would approximate the earliest appropriate time to consider surgery to be approximately 2 weeks in the future.  I will continue to follow.  No indications for steroids at this point.  Kathaleen Maser Whit Bruni 12/20/2019, 7:39 PM

## 2019-12-20 NOTE — Progress Notes (Signed)
eLink Physician-Brief Progress Note Patient Name: Anna Campos DOB: 1965/11/19 MRN: [id number]   Date of Service  12/20/2019  HPI/Events of Note  Notified of positive blood culture, GPC 1/4. Also with hypotensive episodes of unknown etiology. WBC now 15 with 1% bands, no obvious source of infection hence not on antibiotics.  eICU Interventions  Given above setting I think it is prudent to start antibiotics for now. Ordered vancomycin     Intervention Category Major Interventions: Infection - evaluation and management  Darl Pikes 12/20/2019, 12:40 AM

## 2019-12-20 NOTE — Progress Notes (Signed)
Rehab Admissions Coordinator Note:  Per PT recommendation, patient was screened by Stephania Fragmin for appropriateness for an Inpatient Acute Rehab Consult.  At this time, we are recommending Inpatient Rehab consult.  I will place an order per protocol.   Stephania Fragmin 12/20/2019, 12:13 PM  I can be reached at [phone].

## 2019-12-20 NOTE — Progress Notes (Signed)
Pharmacy Antibiotic Note  Anna Campos is a 54 y.o. female with streptococcus bacteremia.  Pharmacy has been consulted for Vancomycin dosing. -WBC= 8.4, afebrile, SCr= 0.79 -vancomycin 750mg  IV given at 9:30am  Cultures -2/28 BCx: 1/2 GPC > strep constellatus 2/4  Plan: Vancomycin 750 mg IV q12h (Est AUC 430) -Will follow renal function, cultures and clinical progress  Height: 5\' 9"  (175.3 cm) Weight: 140 lb 6.9 oz (63.7 kg) IBW/kg (Calculated) : 66.2  Temp (24hrs), Avg:98.5 F (36.9 C), Min:98.2 F (36.8 C), Max:98.9 F (37.2 C)  Recent Labs  Lab 12/18/19 2230 12/18/19 2241 12/19/19 0155 12/19/19 1046 12/20/19 0325  WBC 7.9  --  10.8* 15.2* 8.4  CREATININE 1.19* 1.10* 0.77  --  0.79  LATICACIDVEN 1.8  --   --   --   --     Estimated Creatinine Clearance: 82.7 mL/min (by C-G formula based on SCr of 0.79 mg/dL).    Allergies  Allergen Reactions  . Ampicillin Diarrhea   12/21/19, PharmD Clinical Pharmacist **Pharmacist phone directory can now be found on [email or web address] ([email or web address]).  Listed under [facility].

## 2019-12-20 NOTE — Evaluation (Signed)
Occupational Therapy Evaluation Patient Details Name: Anna Campos MRN: [id number] DOB: [birth year] Today's Date: 12/20/2019    History of Present Illness 54 year old woman with no significant past medical history presenting with syncope.    Clinical Impression   Pt PTA: Pt independent with ADL and mobility. Pt currently maxA to [place] with ADL due to weakness, decreased activity tolerance and decreased coordination. Pt maxA +2 to totalA  For bed mobility and sit to stands. Pt with severe weakness in BUEs/BLEs and zero to poor trunk control on sitting. Pt presents with weakness more distal than proximal with possible spinal cord injury/nerve compression. MD aware. MRIs of spine ordered. Pt would greatly benefit from continued OT skilled services for ADL, mobility and HEP. OT following acutely.    Follow Up Recommendations  CIR;Supervision/Assistance - 24 hour    Equipment Recommendations  3 in 1 bedside commode    Recommendations for Other Services Rehab consult     Precautions / Restrictions Precautions Precautions: Fall Precaution Comments: present like a central cord, distal weakness vs proximal and increased tone/impaired co-ordination in LEs, Restrictions Weight Bearing Restrictions: No      Mobility Bed Mobility Overal bed mobility: Needs Assistance Bed Mobility: Rolling;Sidelying to Sit;Sit to Sidelying Rolling: Max assist;+2 for physical assistance Sidelying to sit: Max assist;+2 for physical assistance     Sit to sidelying: Max assist;+2 for physical assistance General bed mobility comments: Pt attempting to assist with  BUEs; poor trunk control and ability to move BUEs and BLEs. Pt intermittent assist for sitting upright. Pt requiring increased assist for trunk elevation and BLE management.  Transfers Overall transfer level: Needs assistance Equipment used: 2 person hand held assist(2 person lift with gait belt and bed pad) Transfers: Sit to/from Stand Sit to  Stand: Total assist;+2 physical assistance         General transfer comment: pt with poor trunk control unable to pull up with bilat hands and required bilat knee blocking, completed 3 attempts,pt unable to achieve bilat knee extension , dependent on physical assist x2 to maintain up right position    Balance Overall balance assessment: Needs assistance Sitting-balance support: Bilateral upper extremity supported;Feet supported Sitting balance-Leahy Scale: Zero Sitting balance - Comments: pt requiring min to maxA for PT/OT to maintain EOB balance, occasionally for a few seconds pt able to maintain balance and midline at EOB however once pt lost it pt unable to regain/self correct requiring maxA from therapist   Standing balance support: Bilateral upper extremity supported Standing balance-Leahy Scale: Zero Standing balance comment: dependent on physical assistx2                           ADL either performed or assessed with clinical judgement   ADL Overall ADL's : Needs assistance/impaired Eating/Feeding: Maximal assistance;Bed level Eating/Feeding Details (indicate cue type and reason): Pt with practice can perform lap to mouth pattern, but unable to grip built up spoon Grooming: Total assistance   Upper Body Bathing: Total assistance   Lower Body Bathing: Total assistance   Upper Body Dressing : Total assistance   Lower Body Dressing: Total assistance   Toilet Transfer: Total assistance;+2 for physical assistance;+2 for safety/equipment   Toileting- Clothing Manipulation and Hygiene: Total assistance       Functional mobility during ADLs: Total assistance General ADL Comments: Pt maxA to totalA due to weakness, decreased activity tolerance and decreased coordination.     Vision Baseline Vision/History: No visual deficits Vision Assessment?:  No apparent visual deficits     Perception     Praxis      Pertinent Vitals/Pain Pain Assessment: 0-10 Pain  Score: 3  Pain Location: arms/legs Pain Descriptors / Indicators: Throbbing Pain Intervention(s): Monitored during session     Hand Dominance Right   Extremity/Trunk Assessment Upper Extremity Assessment Upper Extremity Assessment: Generalized weakness;RUE deficits/detail;LUE deficits/detail RUE Deficits / Details: 2/5 MM grade shoulder through elbow; limited wrist AROM and minimal grip stength RUE Coordination: decreased gross motor;decreased fine motor LUE Deficits / Details: 2/5 MM grade shoulder through elbow; limited wrist AROM and minimal grip stength LUE Coordination: decreased fine motor;decreased gross motor   Lower Extremity Assessment Lower Extremity Assessment: Generalized weakness;Defer to PT evaluation   Cervical / Trunk Assessment Cervical / Trunk Assessment: Other exceptions Cervical / Trunk Exceptions: on CT pt found to have severe cervical spondylosis at multiple levels   Communication Communication Communication: No difficulties   Cognition Arousal/Alertness: Awake/alert Behavior During Therapy: WFL for tasks assessed/performed Overall Cognitive Status: Within Functional Limits for tasks assessed                                 General Comments: able to correct Feb 28th to March 1st.   General Comments  Edema noted. Pt with some dizziness in sitting; VSS.    Exercises     Shoulder Instructions      Home Living Family/patient expects to be discharged to:: Private residence Living Arrangements: Spouse/significant other;Children(17 yo daughter) Available Help at Discharge: Family;Available 24 hours/day Type of Home: House Home Access: Stairs to enter [employer] of Steps: 3-4 Entrance Stairs-Rails: Right Home Layout: One level     Bathroom Shower/Tub: IT trainer: Standard     Home Equipment: None   Additional Comments: Spouse home by 3pm daily      Prior Functioning/Environment Level  of Independence: Independent        Comments: works in Clinical biochemist for [employer], works from home, 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        OT Problem List: Decreased strength;Decreased activity tolerance;Impaired balance (sitting and/or standing);Decreased safety awareness;Decreased coordination;Pain;Impaired UE functional use;Increased edema;Decreased knowledge of use of DME or AE;Decreased range of motion      OT Treatment/Interventions: Self-care/ADL training;Therapeutic exercise;Energy conservation;Therapeutic activities;Patient/family education;Balance training;Neuromuscular education    OT Goals(Current goals can be found in the care plan section) Acute Rehab OT Goals Patient Stated Goal: "I want to walk again" OT Goal Formulation: With patient Time For Goal Achievement: 01/03/20 Potential to Achieve Goals: Good ADL Goals Pt Will Perform Eating: with min assist;with adaptive utensils;sitting Pt Will Perform Grooming: with min assist;sitting Pt Will Perform Upper Body Dressing: with min assist;sitting Pt Will Transfer to Toilet: with max assist;stand pivot transfer;bedside commode Pt/caregiver will Perform Home Exercise Program: Increased ROM;Increased strength;Both right and left upper extremity;With minimal assist Additional ADL Goal #1: Pt will increase to fair dynamic sitting balance in prep for ADL tasks.  OT Frequency: Min 2X/week   Barriers to D/C:            Co-evaluation              AM-PAC OT "6 Clicks" Daily Activity     Outcome Measure Help from another person eating meals?: Total Help from another person taking care of personal grooming?: Total Help from another person toileting, which includes using toliet, bedpan, or urinal?: Total  Help from another person bathing (including washing, rinsing, drying)?: Total Help from another person to put on and taking off regular upper body clothing?: Total Help from another person to put on and taking  off regular lower body clothing?: Total 6 Click Score: 6   End of Session Equipment Utilized During Treatment: Gait belt Nurse Communication: Mobility status  Activity Tolerance: Treatment limited secondary to medical complications (Comment);Patient limited by fatigue Patient left: in bed;with call bell/phone within reach;with bed alarm set  OT Visit Diagnosis: Unsteadiness on feet (R26.81);Muscle weakness (generalized) (M62.81)                Time: 1610-9604 OT Time Calculation (min): 45 min Charges:  OT General Charges $OT Visit: 1 Visit OT Evaluation $OT Eval Moderate Complexity: 1 Mod  Flora Lipps, OTR/L Acute Rehabilitation Services Pager: [phone] Office: [phone]   Cameren Earnest C 12/20/2019, 3:57 PM

## 2019-12-20 NOTE — Progress Notes (Signed)
Noralee Stain DO paged on amion and notified to please see cervical spine results as radiologist called and was attempting to relay results

## 2019-12-20 NOTE — Progress Notes (Signed)
Pt remains off floor at this time.

## 2019-12-20 NOTE — Evaluation (Signed)
Physical Therapy Evaluation Patient Details Name: Anna Campos MRN: [id number] DOB: 1966/02/18 Today's Date: 12/20/2019   History of Present Illness  54 year old woman with no significant past medical history presenting with syncope.   Clinical Impression  Pt admitted with above. CT of cervical spine revealed extensive multi-level cervical spondylosis with foraminal narrowing. Pt presenting with symptoms of central cord syndrome has pt with minimal bilat hand function, has more function of shoulders and elbows, has increased bilat LE tone, minimal active mvmt t/o LEs. Pt with impaired co-ordination, ataxia, and impaired sequencing. Pt was independent and working PTA. Recommended MRI to Dr. Alvino Chapel, pt to go for imaging today. Pt very motivated to return to PLOF and has good home set up and support. Recommend CIR Upon d/c for maximal functional recovery. Acute PT to cont to follow.    Follow Up Recommendations CIR    Equipment Recommendations  Other (comment)(TBD)    Recommendations for Other Services Rehab consult     Precautions / Restrictions Precautions Precautions: Fall Precaution Comments: present like a central cord, distal weakness vs proximal and increased tone/impaired co-ordination in LEs, Restrictions Weight Bearing Restrictions: No      Mobility  Bed Mobility Overal bed mobility: Needs Assistance Bed Mobility: Rolling;Sidelying to Sit;Sit to Sidelying Rolling: Max assist;+2 for physical assistance Sidelying to sit: Max assist;+2 for physical assistance     Sit to sidelying: Max assist;+2 for physical assistance General bed mobility comments: pt attempting to initiate with UEs however unable to move LEs and ultimately requiring maxAX2 for rolling L/R for hygiene s/p urinary incontinence, maxA for LE management and trunk management to achieve sitting EOB transfer and return back to bed  Transfers Overall transfer level: Needs assistance Equipment used: 2 person hand held  assist(2 person lift with gait belt and bed pad) Transfers: Sit to/from Stand Sit to Stand: Total assist;+2 physical assistance         General transfer comment: pt with poor trunk control unable to pull up with bilat hands and required bilat knee blocking, completed 3 attempts,pt unable to achieve bilat knee extension , dependent on physical assist x2 to maintain up right position  Ambulation/Gait             General Gait Details: unable at this time  Stairs            Wheelchair Mobility    Modified Rankin (Stroke Patients Only)       Balance Overall balance assessment: Needs assistance Sitting-balance support: Bilateral upper extremity supported;Feet supported Sitting balance-Leahy Scale: Zero Sitting balance - Comments: pt requiring min to maxA for PT/OT to maintain EOB balance, occasionally for a few seconds pt able to maintain balance and midline at EOB however once pt lost it pt unable to regain/self correct requiring maxA from therapist   Standing balance support: Bilateral upper extremity supported Standing balance-Leahy Scale: Zero Standing balance comment: dependent on physical assistx2                             Pertinent Vitals/Pain Pain Assessment: 0-10 Pain Score: 3  Pain Location: arms/legs Pain Descriptors / Indicators: Throbbing Pain Intervention(s): Monitored during session    Home Living Family/patient expects to be discharged to:: Private residence Living Arrangements: Spouse/significant other;Children(17 yo daughter) Available Help at Discharge: Family;Available 24 hours/day Type of Home: House Home Access: Stairs to enter Entrance Stairs-Rails: Right Entrance Stairs-Number of Steps: 3-4 Home Layout: One level Home Equipment: None Additional  Comments: Spouse home by 3pm daily    Prior Function Level of Independence: Independent         Comments: works in Clinical biochemist for [employer], works from home, 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 Dominance   Dominant Hand: Right    Extremity/Trunk Assessment   Upper Extremity Assessment Upper Extremity Assessment: Defer to OT evaluation(minimal active bilat hand movement, can initiate shld, elbow)    Lower Extremity Assessment Lower Extremity Assessment: RLE deficits/detail;LLE deficits/detail RLE Deficits / Details: pt with minimal active movement, noted quad set, able to wiggle toes, increased extensor and adductor tone, PROM limited to less than 45 deg at hip and knee due to tone, no gross volitional movement RLE Sensation: decreased proprioception RLE Coordination: decreased fine motor;decreased gross motor LLE Deficits / Details: pt with minimal active movement, noted quad set, able to wiggle toes, increased extensor and adductor tone, PROM limited to less than 45 deg at hip and knee due to tone, no gross volitional movement LLE Sensation: decreased proprioception LLE Coordination: decreased fine motor;decreased gross motor    Cervical / Trunk Assessment Cervical / Trunk Assessment: Other exceptions Cervical / Trunk Exceptions: on CT pt found to have severe cervical spondylosis at multiple levels  Communication   Communication: No difficulties  Cognition Arousal/Alertness: Awake/alert Behavior During Therapy: WFL for tasks assessed/performed Overall Cognitive Status: Within Functional Limits for tasks assessed                                 General Comments: able to correct Feb 28th to March 1st.      General Comments General comments (skin integrity, edema, etc.): pt with mild generalized edema    Exercises     Assessment/Plan    PT Assessment Patient needs continued PT services  PT Problem List Decreased strength;Decreased range of motion;Decreased activity tolerance;Decreased balance;Decreased mobility;Decreased coordination;Decreased cognition;Decreased knowledge of use of DME;Decreased safety  awareness;Impaired tone;Impaired sensation;Pain       PT Treatment Interventions DME instruction;Gait training;Stair training;Functional mobility training;Therapeutic activities;Therapeutic exercise;Balance training;Neuromuscular re-education    PT Goals (Current goals can be found in the Care Plan section)  Acute Rehab PT Goals Patient Stated Goal: "I want to walk again" PT Goal Formulation: With patient Time For Goal Achievement: 01/03/20 Potential to Achieve Goals: Good    Frequency Min 4X/week   Barriers to discharge        Co-evaluation               AM-PAC PT "6 Clicks" Mobility  Outcome Measure Help needed turning from your back to your side while in a flat bed without using bedrails?: A Lot Help needed moving from lying on your back to sitting on the side of a flat bed without using bedrails?: A Lot Help needed moving to and from a bed to a chair (including a wheelchair)?: Total Help needed standing up from a chair using your arms (e.g., wheelchair or bedside chair)?: Total Help needed to walk in hospital room?: Total Help needed climbing 3-5 steps with a railing? : Total 6 Click Score: 8    End of Session Equipment Utilized During Treatment: Gait belt Activity Tolerance: Patient tolerated treatment well Patient left: in bed;with call bell/phone within reach;with bed alarm set Nurse Communication: Mobility status PT Visit Diagnosis: Unsteadiness on feet (R26.81);Other abnormalities of gait and mobility (R26.89);Repeated falls (R29.6);Muscle weakness (generalized) (M62.81);History of falling (  Z91.81);Ataxic gait (R26.0);Difficulty in walking, not elsewhere classified (R26.2)    Time: 0312-8118 PT Time Calculation (min) (ACUTE ONLY): 41 min   Charges:   PT Evaluation $PT Eval Moderate Complexity: 1 Mod PT Treatments $Therapeutic Activity: 8-22 mins        Kittie Plater, PT, DPT Acute Rehabilitation Services Pager #: [phone] Office #:  [phone]   Berline Lopes 12/20/2019, 10:58 AM

## 2019-12-20 NOTE — Progress Notes (Signed)
Pharmacy Antibiotic Note  Anna Campos is a 54 y.o. female with hypotension/sepsis, possible bacteremia.  Pharmacy has been consulted for Vancomycin dosing.  Plan: Vancomycin 1250 mg IV now, then 750 mg IV q12h  Est AUC 435  Height: 5\' 9"  (175.3 cm) Weight: 135 lb 5.8 oz (61.4 kg) IBW/kg (Calculated) : 66.2  Temp (24hrs), Avg:98.5 F (36.9 C), Min:97.4 F (36.3 C), Max:99.3 F (37.4 C)  Recent Labs  Lab 12/18/19 2230 12/18/19 2241 12/19/19 0155 12/19/19 1046  WBC 7.9  --  10.8* 15.2*  CREATININE 1.19* 1.10* 0.77  --   LATICACIDVEN 1.8  --   --   --     Estimated Creatinine Clearance: 79.7 mL/min (by C-G formula based on SCr of 0.77 mg/dL).    Allergies  Allergen Reactions  . Ampicillin Diarrhea    12/21/19 12/20/2019 12:42 AM

## 2019-12-20 NOTE — Progress Notes (Addendum)
   Vital Signs MEWS/VS Documentation      12/19/2019 2026 12/19/2019 2100 12/19/2019 2300 12/20/2019 0020   MEWS Score:  1  1  1  3    MEWS Score Color:   Green  Green  Yellow   Resp:  18  15  19  10    Pulse:  74  --  --  72   BP:  (!) 93/59  --  --  (!) 72/40   Temp:  98.5 F (36.9 C)  --  --  98.4 F (36.9 C)   O2 Device:  Room Air  --  --  Room Air     Patient with yellow mews due to low BP.  BP checked manually with similar results.  Elink notified and received orders for 500cc bolus and IV vanc.    0236-BP 82/51 (61) 0331-BP 93/55 (67)     Chilton Si 12/20/2019,2:14 AM

## 2019-12-20 NOTE — Progress Notes (Addendum)
NT notified of low BP.  Manual BP 72/40.  Elink notified.

## 2019-12-20 NOTE — Progress Notes (Signed)
Bilateral lower extremity venous duplex exam completed.  Preliminary results can be found under CV proc under chart review.  12/20/2019 2:56 PM  Sameena Artus, K., RDMS, RVT

## 2019-12-21 ENCOUNTER — Encounter (HOSPITAL_COMMUNITY): Payer: Self-pay | Admitting: Pulmonary Disease

## 2019-12-21 ENCOUNTER — Other Ambulatory Visit: Payer: Self-pay

## 2019-12-21 DIAGNOSIS — Z881 Allergy status to other antibiotic agents status: Secondary | ICD-10-CM

## 2019-12-21 DIAGNOSIS — K0889 Other specified disorders of teeth and supporting structures: Secondary | ICD-10-CM

## 2019-12-21 DIAGNOSIS — G8254 Quadriplegia, C5-C7 incomplete: Secondary | ICD-10-CM

## 2019-12-21 DIAGNOSIS — G8250 Quadriplegia, unspecified: Secondary | ICD-10-CM

## 2019-12-21 DIAGNOSIS — R7881 Bacteremia: Secondary | ICD-10-CM

## 2019-12-21 DIAGNOSIS — S0181XA Laceration without foreign body of other part of head, initial encounter: Secondary | ICD-10-CM

## 2019-12-21 DIAGNOSIS — B955 Unspecified streptococcus as the cause of diseases classified elsewhere: Secondary | ICD-10-CM

## 2019-12-21 DIAGNOSIS — M4802 Spinal stenosis, cervical region: Secondary | ICD-10-CM

## 2019-12-21 DIAGNOSIS — Z87891 Personal history of nicotine dependence: Secondary | ICD-10-CM

## 2019-12-21 DIAGNOSIS — I959 Hypotension, unspecified: Secondary | ICD-10-CM

## 2019-12-21 DIAGNOSIS — W19XXXA Unspecified fall, initial encounter: Secondary | ICD-10-CM

## 2019-12-21 LAB — CBC
HCT: 29.9 % — ABNORMAL LOW (ref 36.0–46.0)
Hemoglobin: 9.9 g/dL — ABNORMAL LOW (ref 12.0–15.0)
MCH: 30.1 pg (ref 26.0–34.0)
MCHC: 33.1 g/dL (ref 30.0–36.0)
MCV: 90.9 fL (ref 80.0–100.0)
Platelets: 180 10*3/uL (ref 150–400)
RBC: 3.29 MIL/uL — ABNORMAL LOW (ref 3.87–5.11)
RDW: 12.2 % (ref 11.5–15.5)
WBC: 5.9 10*3/uL (ref 4.0–10.5)
nRBC: 0 % (ref 0.0–0.2)

## 2019-12-21 LAB — BASIC METABOLIC PANEL
Anion gap: 7 (ref 5–15)
BUN: 6 mg/dL (ref 6–20)
CO2: 21 mmol/L — ABNORMAL LOW (ref 22–32)
Calcium: 8.2 mg/dL — ABNORMAL LOW (ref 8.9–10.3)
Chloride: 113 mmol/L — ABNORMAL HIGH (ref 98–111)
Creatinine, Ser: 0.8 mg/dL (ref 0.44–1.00)
GFR calc Af Amer: >60 mL/min (ref >60)
GFR calc non Af Amer: >60 mL/min (ref >60)
Glucose, Bld: 80 mg/dL (ref 70–99)
Potassium: 3.8 mmol/L (ref 3.5–5.1)
Sodium: 141 mmol/L (ref 135–145)

## 2019-12-21 LAB — HEPARIN LEVEL (UNFRACTIONATED)
Heparin Unfractionated: 1.06 IU/mL — ABNORMAL HIGH (ref 0.30–0.70)
Heparin Unfractionated: 0.89 IU/mL — ABNORMAL HIGH (ref 0.30–0.70)
Heparin Unfractionated: 0.56 IU/mL (ref 0.30–0.70)

## 2019-12-21 LAB — MAGNESIUM: Magnesium: 1.9 mg/dL (ref 1.7–2.4)

## 2019-12-21 MED ORDER — SODIUM CHLORIDE 0.9 % IV SOLN
2.0000 g | INTRAVENOUS | Status: AC
  Administered 2019-12-21 – 2020-01-01 (×13): 2 g via INTRAVENOUS
  Filled 2019-12-21: qty 20
  Filled 2019-12-21: qty 2
  Filled 2019-12-21: qty 20
  Filled 2019-12-21 (×2): qty 2
  Filled 2019-12-21: qty 20
  Filled 2019-12-21: qty 2
  Filled 2019-12-21: qty 20
  Filled 2019-12-21 (×5): qty 2

## 2019-12-21 NOTE — Progress Notes (Signed)
Providing Compassionate, Quality Care - Together   Subjective: Patient reports slight improvement in mobility since yesterday.  Objective: Vital signs in last 24 hours: Temp:  [98.5 F (36.9 C)-100 F (37.8 C)] 98.7 F (37.1 C) (03/02 1322) Pulse Rate:  [63-87] 68 (03/02 1322) Resp:  [14-17] 14 (03/02 1322) BP: (92-120)/(61-76) 120/76 (03/02 1322) SpO2:  [95 %-100 %] 100 % (03/02 1322) Weight:  [63.9 kg] 63.9 kg (03/02 0038)  Intake/Output from previous day: 03/01 0701 - 03/02 0700 In: 553.3 [P.O.:240; I.V.:163.3; IV Piggyback:150] Out: 1200 [Urine:1200] Intake/Output this shift: Total I/O In: 309.8 [P.O.:240; I.V.:69.8] Out: 1600 [Urine:1600]  Alert and oriented x 4 PERRLA CN II-XII grossly inact 3/5 strength in bilateral deltoids, 2/3 strength in bilateral biceps 2/5 grip strength bilaterally 2/5 bilateral hip flexors Decreased light-touch sensation in all extremities Dysesthetic pain is improved from yesterday  Lab Results: Recent Labs    12/20/19 0325 12/21/19 0415  WBC 8.4 5.9  HGB 9.3* 9.9*  HCT 28.3* 29.9*  PLT 201 180   BMET Recent Labs    12/20/19 0325 12/21/19 0415  NA 140 141  K 3.7 3.8  CL 115* 113*  CO2 21* 21*  GLUCOSE 91 80  BUN <5* 6  CREATININE 0.79 0.80  CALCIUM 8.0* 8.2*    Studies/Results: CT ANGIO CHEST PE W OR WO CONTRAST  Result Date: 12/20/2019 CLINICAL DATA:  54 year old female with history of syncope. Elevated D-dimer. Chest pain. EXAM: CT ANGIOGRAPHY CHEST WITH CONTRAST TECHNIQUE: Multidetector CT imaging of the chest was performed using the standard protocol during bolus administration of intravenous contrast. Multiplanar CT image reconstructions and MIPs were obtained to evaluate the vascular anatomy. CONTRAST:  20mL OMNIPAQUE IOHEXOL 350 MG/ML SOLN COMPARISON:  Chest CT 12/18/2019. FINDINGS: Cardiovascular: There are small filling defects within the pulmonary arteries bilaterally involving segmental sized branches in the  left lower lobe and subsegmental sized branches in the right lower lobe. These are nonocclusive. No larger central or lobar sized filling defects are noted. Heart size is normal. There is no significant pericardial fluid, thickening or pericardial calcification. There is aortic atherosclerosis, as well as atherosclerosis of the great vessels of the mediastinum and the coronary arteries, including calcified atherosclerotic plaque in the left anterior descending coronary artery. Mediastinum/Nodes: No pathologically enlarged mediastinal or hilar lymph nodes. Esophagus is unremarkable in appearance. No axillary lymphadenopathy. Lungs/Pleura: Dependent areas of subsegmental atelectasis are noted in lower lobes of the lungs bilaterally. No acute consolidative airspace disease. No pleural effusions. Upper Abdomen: Intermediate attenuation material lying dependently in the gallbladder, presumably biliary sludge. Musculoskeletal: There are no aggressive appearing lytic or blastic lesions noted in the visualized portions of the skeleton. Review of the MIP images confirms the above findings. IMPRESSION: 1. Small nonocclusive pulmonary emboli bilaterally involving segmental and subsegmental sized vessels, as above. 2. Aortic atherosclerosis, in addition to left anterior descending coronary artery disease. Please note that although the presence of coronary artery calcium documents the presence of coronary artery disease, the severity of this disease and any potential stenosis cannot be assessed on this non-gated CT examination. Assessment for potential risk factor modification, dietary therapy or pharmacologic therapy may be warranted, if clinically indicated. 3. Biliary sludge in the gallbladder. These results will be called to the ordering clinician or representative by the Radiologist Assistant, and communication documented in the PACS or zVision Dashboard. Aortic Atherosclerosis (ICD10-I70.0). Electronically Signed   By:  Trudie Reed M.D.   On: 12/20/2019 10:53   MR BRAIN WO CONTRAST  Result Date: 12/20/2019 CLINICAL DATA:  Neuro deficit, subacute. Additional history provided: Syncope, generalized fatigue, poor p.o. intake. EXAM: MRI HEAD WITHOUT CONTRAST TECHNIQUE: Multiplanar, multiecho pulse sequences of the brain and surrounding structures were obtained without intravenous contrast. COMPARISON:  Head CT 12/18/2019. FINDINGS: Brain: The examination is intermittently motion degraded. Most notably there is mild-to-moderate motion degradation of the axial T2 FLAIR sequence and moderate/severe motion degradation of the coronal T2 weighted sequence. There is no evidence of acute infarct. No evidence of intracranial mass. No midline shift or extra-axial fluid collection. No chronic intracranial blood products. Mild scattered T2/FLAIR hyperintensity within the cerebral white matter is nonspecific, but most commonly seen on the basis of chronic small vessel ischemic disease. Mild generalized parenchymal atrophy. Incidentally noted cavum septum pellucidum and cavum vergae. Vascular: Visualized orbits demonstrate no acute abnormality. Skull and upper cervical spine: No focal marrow lesion. Sinuses/Orbits: Visualized orbits demonstrate no acute abnormality. Mild scattered paranasal sinus mucosal thickening greatest within bilateral ethmoid air cells. No significant mastoid effusion. IMPRESSION: 1. Intermittently motion degraded exam. 2. No evidence of acute intracranial abnormality. 3. Mild scattered T2 hyperintense signal changes within the cerebral white matter are nonspecific, but most commonly seen on the basis of chronic small vessel ischemic disease. 4. Mild generalized parenchymal atrophy. 5. Mild paranasal sinus mucosal thickening. Electronically Signed   By: Kellie Simmering DO   On: 12/20/2019 16:57   MR CERVICAL SPINE WO CONTRAST  Result Date: 12/20/2019 CLINICAL DATA:  Spinal stenosis, cervical spine. EXAM: MRI CERVICAL  SPINE WITHOUT CONTRAST TECHNIQUE: Multiplanar, multisequence MR imaging of the cervical spine was performed. No intravenous contrast was administered. COMPARISON:  CT cervical spine 12/18/2019. FINDINGS: Mildly motion degraded examination Alignment: Mild reversal of the expected cervical lordosis. No significant spondylolisthesis. Vertebrae: Vertebral body height is maintained. No significant marrow edema or suspicious osseous lesion. Multilevel degenerative endplate irregularity and mixed degenerative endplate marrow signal. This includes mild degenerative endplate edema at Q7-Y1. Cord: There is prominent T2/STIR hyperintense cord signal abnormality at the C4 through C6 levels. The cord signal abnormality involves the entire cord at the lower C5 level. Posterior Fossa, vertebral arteries, paraspinal tissues: No abnormality identified within included portions of the posterior fossa. Flow voids preserved within the cervical vertebral arteries. There is STIR hyperintense signal within the interspinous spaces at the C3-C4 through T1-T2 levels. Disc levels: Moderate/severe C3-C4 disc degeneration. Moderate disc degeneration at the remaining cervical levels. C2-C3: No disc herniation. No significant canal or foraminal stenosis. C3-C4: Posterior disc osteophyte complex. Uncinate/facet hypertrophy. Mild spinal canal stenosis. There is contact upon the ventral spinal cord with minimal flattening of the ventral cord. Bilateral neural foraminal narrowing (moderate right, moderate/severe left). C4-C5: Posterior disc osteophyte complex. Uncinate/facet hypertrophy. Severe spinal canal stenosis with mild spinal cord flattening. Bilateral neural foraminal narrowing (moderate right, severe left). C5-C6: Posterior disc osteophyte complex. Uncinate/facet hypertrophy. Mild spinal canal stenosis. Severe bilateral neural foraminal narrowing. C6-C7: Posterior disc osteophyte complex. Uncinate/facet hypertrophy. Mild spinal canal  stenosis. Severe bilateral neural foraminal narrowing. C7-T1: No disc herniation. No significant canal or foraminal stenosis. IMPRESSION: Prominent spinal cord signal abnormality at the C4 through C6 levels. There is involvement of the entire cord cross-section at the lower C5 level. Differential considerations include spinal cord infarct, contusion, demyelinating disease or other inflammatory/infectious process. Edema signal within the interspinous spaces at the C3-T2 levels, which may reflect interspinous ligament injury. Cervical spondylosis as described and most notably as follows. At C4-C5, a posterior disc osteophyte complex contributes to severe spinal  canal stenosis with mild spinal cord flattening. Bilateral neural foraminal narrowing (moderate right, severe left) No more than mild spinal canal stenosis at the remaining levels. Additional sites of neural foraminal narrowing, including site of severe and moderate/severe neural foraminal narrowing as detailed. Electronically Signed   By: Jackey Loge DO   On: 12/20/2019 17:32   MR THORACIC SPINE WO CONTRAST  Result Date: 12/20/2019 CLINICAL DATA:  Distal greater than proximal weakness, trauma/fall at home prior to admission. EXAM: MRI THORACIC SPINE WITHOUT CONTRAST TECHNIQUE: Multiplanar, multisequence MR imaging of the thoracic spine was performed. No intravenous contrast was administered. COMPARISON:  CT chest 12/18/2019 FINDINGS: Mildly motion degraded examination. Alignment:  Alignment is maintained. Vertebrae: Vertebral body height is maintained. No significant marrow edema or suspicious osseous lesion. Cord:  No spinal cord signal abnormality is identified. Paraspinal and other soft tissues: Mild nonspecific edema within the dorsal subcutaneous soft tissues overlying the lower thoracic and visualized upper lumbar spine. No abnormality identified within included portions of the thorax or upper abdomen. Disc levels: Mild disc degeneration throughout  the thoracic spine. Mild multilevel disc bulges and facet arthrosis/ligamentum flavum hypertrophy. No significant spinal canal stenosis at any level. No compressive foraminal stenosis. IMPRESSION: Mildly motion degraded examination. No spinal cord signal abnormality is identified. Mild thoracic spondylosis with no significant spinal canal stenosis, and no compressive foraminal narrowing. Electronically Signed   By: Jackey Loge DO   On: 12/20/2019 17:59   VAS [place] LOWER EXTREMITY VENOUS (DVT)  Result Date: 12/20/2019  Lower Venous DVTStudy Indications: Pulmonary embolism.  Anticoagulation: Heparin. Comparison Study: No prior exam. Performing Technologist: Kennedy Bucker ARDMS, RVT  Examination Guidelines: A complete evaluation includes B-mode imaging, spectral Doppler, color Doppler, and power Doppler as needed of all accessible portions of each vessel. Bilateral testing is considered an integral part of a complete examination. Limited examinations for reoccurring indications may be performed as noted. The reflux portion of the exam is performed with the patient in reverse Trendelenburg.  +---------+---------------+---------+-----------+----------+--------------+ RIGHT    CompressibilityPhasicitySpontaneityPropertiesThrombus Aging +---------+---------------+---------+-----------+----------+--------------+ CFV      Full           Yes      Yes                                 +---------+---------------+---------+-----------+----------+--------------+ SFJ      Full                                                        +---------+---------------+---------+-----------+----------+--------------+ FV Prox  Full                                                        +---------+---------------+---------+-----------+----------+--------------+ FV Mid   Full                                                        +---------+---------------+---------+-----------+----------+--------------+ FV  DistalFull                                                        +---------+---------------+---------+-----------+----------+--------------+  PFV      Full                                                        +---------+---------------+---------+-----------+----------+--------------+ POP      Full           Yes      Yes                                 +---------+---------------+---------+-----------+----------+--------------+ PTV      Full                                                        +---------+---------------+---------+-----------+----------+--------------+ PERO     Full                                                        +---------+---------------+---------+-----------+----------+--------------+   +---------+---------------+---------+-----------+----------+--------------+ LEFT     CompressibilityPhasicitySpontaneityPropertiesThrombus Aging +---------+---------------+---------+-----------+----------+--------------+ CFV      Full           Yes      Yes                                 +---------+---------------+---------+-----------+----------+--------------+ SFJ      Full                                                        +---------+---------------+---------+-----------+----------+--------------+ FV Prox  Full                                                        +---------+---------------+---------+-----------+----------+--------------+ FV Mid   Full                                                        +---------+---------------+---------+-----------+----------+--------------+ FV DistalFull                                                        +---------+---------------+---------+-----------+----------+--------------+ PFV      Full                                                        +---------+---------------+---------+-----------+----------+--------------+  POP      Full           Yes      Yes                                  +---------+---------------+---------+-----------+----------+--------------+ PTV      Full                                                        +---------+---------------+---------+-----------+----------+--------------+ PERO     Full                                                        +---------+---------------+---------+-----------+----------+--------------+     Summary: BILATERAL: - No evidence of deep vein thrombosis seen in the lower extremities, bilaterally.  RIGHT: - No cystic structure found in the popliteal fossa.  LEFT: - No cystic structure found in the popliteal fossa.  *See table(s) above for measurements and observations. Electronically signed by Fabienne Bruns MD on 12/20/2019 at 5:54:34 PM.    Final     Assessment/Plan: Mrs. Howson had a syncopal episode at home followed by profound weakness. She was admitted with bacteremia and a pulmonary embolism. MRI scan of the patient's c-spine revealed significant stenosis. Mrs. Pilkenton suffered a significant incomplete spinal cord injury secondary to her fall superimposed upon significant degenerative disease with stenosis most prominently at the C4-5 level but also significantly at the C3-4 and C5-6 levels.   LOS: 2 days    -Mobilize with therapies -Antibiotics per ID -Heparin gtt per pharmacy -Plan for surgery once medically stable; estimated for two weeks out   Val Eagle, DNP, AGNP-C Nurse Practitioner  [facility] [address], [place], [place] [address] P: [phone]    F: [phone]  12/21/2019, 2:50 PM

## 2019-12-21 NOTE — Care Management (Signed)
Per Valentina Shaggy [facility] help desk: [phone].  Co-pay for Eliquis 2.5 mg. and or 5 mg. for 30 day supply bid $40.00 Co-pay for Xarelto 15 mg. and or 20 mg. for a 30 day supply daily.$40.00.  [facility] order for Eliquis 2.5 mg and or 5 mg. Bid for 90 day supply $100.00. Xarelto 15 mg, and or 20 mg daily for 90 day supply $100.00.  No PA required No deductible Tier 2 Retail Pharmacy:[facility]  Ref# [id number]

## 2019-12-21 NOTE — Progress Notes (Signed)
ANTICOAGULATION CONSULT NOTE  Pharmacy Consult for heparin Indication: pulmonary embolus  Allergies  Allergen Reactions  . Ampicillin Diarrhea    Patient Measurements: Height: 5\' 9"  (175.3 cm) Weight: 140 lb 14.4 oz (63.9 kg) IBW/kg (Calculated) : 66.2 Heparin Dosing Weight: TBW  Vital Signs: Temp: 98.7 F (37.1 C) (03/02 1322) Temp Source: Oral (03/02 1322) BP: 120/76 (03/02 1322) Pulse Rate: 68 (03/02 1322)  Labs: Recent Labs    12/18/19 2230 12/18/19 2241 12/19/19 0155 12/19/19 0155 12/19/19 1046 12/19/19 1046 12/20/19 0325 12/20/19 1850 12/21/19 0415 12/21/19 1400  HGB 10.7*   < > 10.9*   < > 10.1*   < > 9.3*  --  9.9*  --   HCT 34.0*   < > 35.2*   < > 31.2*  --  28.3*  --  29.9*  --   PLT 260   < > 279   < > 239  --  201  --  180  --   LABPROT 14.5  --   --   --   --   --   --   --   --   --   INR 1.1  --   --   --   --   --   --   --   --   --   HEPARINUNFRC  --   --   --   --   --   --   --  0.43 1.06* 0.89*  CREATININE 1.19*   < > 0.77  --   --   --  0.79  --  0.80  --   CKTOTAL  --   --  370*  --   --   --   --   --   --   --   TROPONINIHS <2  --  14  --   --   --   --   --   --   --    < > = values in this interval not displayed.    Estimated Creatinine Clearance: 83 mL/min (by C-G formula based on SCr of 0.8 mg/dL).   Medical History: Past Medical History:  Diagnosis Date  . PE (pulmonary thromboembolism) (HCC) 12/20/2019  . Syncope 12/20/2019     Assessment: 53 YOF with small bilateral PE on CT. Pharmacy dosing heparin  Heparin level supratherapeutic s/p rate decrease to 900 units/hr  Goal of Therapy:  Heparin level 0.3-0.7 units/ml Monitor platelets by anticoagulation protocol: Yes   Plan:  Decrease heparin gtt to 750 units/hr F/u 6 hour heparin level  02/19/2020, PharmD Clinical Pharmacist Please check AMION for all [facility] Pharmacy numbers 12/21/2019 2:32 PM

## 2019-12-21 NOTE — Progress Notes (Signed)
ANTICOAGULATION CONSULT NOTE  Pharmacy Consult for heparin Indication: pulmonary embolus  Allergies  Allergen Reactions  . Ampicillin Diarrhea    Patient Measurements: Height: 5\' 9"  (175.3 cm) Weight: 140 lb 14.4 oz (63.9 kg) IBW/kg (Calculated) : 66.2 Heparin Dosing Weight: TBW  Vital Signs: Temp: 98.4 F (36.9 C) (03/02 2007) Temp Source: Oral (03/02 2007) BP: 111/72 (03/02 2007) Pulse Rate: 62 (03/02 2007)  Labs: Recent Labs    12/18/19 2230 12/18/19 2241 12/19/19 0155 12/19/19 0155 12/19/19 1046 12/19/19 1046 12/20/19 0325 12/20/19 1850 12/21/19 0415 12/21/19 1400 12/21/19 2114  HGB 10.7*   < > 10.9*   < > 10.1*   < > 9.3*  --  9.9*  --   --   HCT 34.0*   < > 35.2*   < > 31.2*  --  28.3*  --  29.9*  --   --   PLT 260   < > 279   < > 239  --  201  --  180  --   --   LABPROT 14.5  --   --   --   --   --   --   --   --   --   --   INR 1.1  --   --   --   --   --   --   --   --   --   --   HEPARINUNFRC  --   --   --   --   --   --   --    < > 1.06* 0.89* 0.56  CREATININE 1.19*   < > 0.77  --   --   --  0.79  --  0.80  --   --   CKTOTAL  --   --  370*  --   --   --   --   --   --   --   --   TROPONINIHS <2  --  14  --   --   --   --   --   --   --   --    < > = values in this interval not displayed.    Estimated Creatinine Clearance: 83 mL/min (by C-G formula based on SCr of 0.8 mg/dL).   Medical History: Past Medical History:  Diagnosis Date  . PE (pulmonary thromboembolism) (HCC) 12/20/2019  . Syncope 12/20/2019     Assessment: 53 YOF with small bilateral PE on CT. Pharmacy dosing heparin -heparin level at goal after decrease to 750 units/hr   Goal of Therapy:  Heparin level 0.3-0.7 units/ml Monitor platelets by anticoagulation protocol: Yes   Plan:  -No heparin changes needed -Daily heparin level and CBC  02/19/2020, PharmD Clinical Pharmacist **Pharmacist phone directory can now be found on [email or web address] ([email or web address]).  Listed under [facility]  Pharmacy.

## 2019-12-21 NOTE — Progress Notes (Signed)
Physical Therapy Treatment Patient Details Name: Anna Campos MRN: [id number] DOB: Sep 22, 1966 Today's Date: 12/21/2019    History of Present Illness 54 year old woman with no significant past medical history presenting with syncope and s/p fall. Pt found to have incomplete spincal cord injury, bacteremia, and bilat PEs. N/S following, plan for surgery in 2 weeks.    PT Comments    Pt now with incomplete SCI C2-6. Pt with improved active ROM at bilat shoulders and elbows and is able to complete active LAQ bilat in sitting. Pt remains to demo increased tone in bilat LEs R worse than L, ataxia, and significant weakness. Pt with increased strength in bilat shoulders but remains to have decreased overall strength especially in wrists/fingers. Pt with active elbow flexion/extension but ataxic/not controlled. Pt able to sit EOB x 15 min today with close min guard during static sitting and modA for dynamic sitting balance. Pt very motivated to return to indep and is an excellent candidate for CIR upon d/c. Acute PT to cont to follow.    Follow Up Recommendations  CIR     Equipment Recommendations  (TBD)    Recommendations for Other Services Rehab consult     Precautions / Restrictions Precautions Precautions: Fall Precaution Comments: central cord, on heparin drip Restrictions Weight Bearing Restrictions: No    Mobility  Bed Mobility Overal bed mobility: Needs Assistance Bed Mobility: Rolling;Sidelying to Sit;Sit to Sidelying Rolling: Max assist;+2 for physical assistance Sidelying to sit: Max assist;+2 for physical assistance     Sit to sidelying: Max assist;+2 for physical assistance General bed mobility comments: pt initiating with UEs and trying with LEs however very ataxic in bilat LE requiring maxA  Transfers                 General transfer comment: deferred today  Ambulation/Gait             General Gait Details: unable at this time   Stairs              Wheelchair Mobility    Modified Rankin (Stroke Patients Only)       Balance Overall balance assessment: Needs assistance Sitting-balance support: Feet supported;Bilateral upper extremity supported Sitting balance-Leahy Scale: Fair Sitting balance - Comments: pt tolerated EOB x 15 min today, pt able to maintain EOB balance, midline and upright posture without physical assist, pt able to correct with verbal cues                                    Cognition Arousal/Alertness: Awake/alert Behavior During Therapy: WFL for tasks assessed/performed Overall Cognitive Status: Within Functional Limits for tasks assessed                                        Exercises General Exercises - Lower Extremity Long Arc Quad: PROM;Both;10 reps;Seated(required modA to maintain balance) Hip Flexion/Marching: AAROM;Both;10 reps;Seated Heel Raises: AROM;Both;10 reps;Seated Other Exercises Other Exercises: worked in sitting on AAROM to [employer], elbow and wrists    General Comments General comments (skin integrity, edema, etc.): pt with skin tear under R eye, bleeding from heparin drip, bilat UE swelling      Pertinent Vitals/Pain Pain Assessment: No/denies pain    Home Living  Prior Function            PT Goals (current goals can now be found in the care plan section) Acute Rehab PT Goals Patient Stated Goal: "I want to walk again" Progress towards PT goals: Progressing toward goals    Frequency    Min 4X/week      PT Plan Current plan remains appropriate    Co-evaluation              AM-PAC PT "6 Clicks" Mobility   Outcome Measure  Help needed turning from your back to your side while in a flat bed without using bedrails?: A Lot Help needed moving from lying on your back to sitting on the side of a flat bed without using bedrails?: A Lot Help needed moving to and from a bed to a chair (including a  wheelchair)?: Total Help needed standing up from a chair using your arms (e.g., wheelchair or bedside chair)?: Total Help needed to walk in hospital room?: Total Help needed climbing 3-5 steps with a railing? : Total 6 Click Score: 8    End of Session Equipment Utilized During Treatment: Gait belt Activity Tolerance: Patient tolerated treatment well Patient left: in bed;with call bell/phone within reach;with bed alarm set;with family/visitor present;with nursing/sitter in room Nurse Communication: Mobility status PT Visit Diagnosis: Unsteadiness on feet (R26.81);Other abnormalities of gait and mobility (R26.89);Repeated falls (R29.6);Muscle weakness (generalized) (M62.81);History of falling (Z91.81);Ataxic gait (R26.0);Difficulty in walking, not elsewhere classified (R26.2)     Time: 9702-6378 PT Time Calculation (min) (ACUTE ONLY): 32 min  Charges:  $Therapeutic Exercise: 8-22 mins $Neuromuscular Re-education: 8-22 mins                     Kittie Plater, PT, DPT Acute Rehabilitation Services Pager #: [phone] Office #: [phone]    Berline Lopes 12/21/2019, 2:26 PM

## 2019-12-21 NOTE — Consult Note (Signed)
[facility]  Total days of antibiotics 2               Reason for Consult:streptococcal bacteremia    Referring Physician: Alvino Chapel  Active Problems:   Syncope    HPI: Anna Campos is a 54 y.o. female with no PMHX who was admitted for syncope and fell forward sustained small facial laceration but loss of consciousness. Found to be hypotensive. She states that she has poor dentition, sometimes worsening pain to mouth, but no other systemic symptoms of fevers, chills, nightsweats. On admit, neuro exam reveals weakness of upper extremities bilaterally, found to have significant cervical stenosis. Neurosurgery planning for decompression after being treated for infection. Work up also revealed positive blood cx with streptococcal constellatus. TTE does not show any signs of vegetation.  Past Medical History:  Diagnosis Date  . PE (pulmonary thromboembolism) (HCC) 12/20/2019  . Syncope 12/20/2019    Allergies:  Allergies  Allergen Reactions  . Ampicillin Diarrhea    MEDICATIONS: . Chlorhexidine Gluconate Cloth  6 each Topical Daily    Social History   Tobacco Use  . Smoking status: Former Games developer  . Smokeless tobacco: Never Used  Substance Use Topics  . Alcohol use: Yes    Comment: OCCASIONAL  . Drug use: Not Currently    Family History  Problem Relation Age of Onset  . Breast cancer Mother     Review of Systems  Constitutional: Negative for fever, chills, diaphoresis, activity change, appetite change, fatigue and unexpected weight change.  HENT: Negative for congestion, sore throat, rhinorrhea, sneezing, trouble swallowing and sinus pressure.  Eyes: Negative for photophobia and visual disturbance.  Respiratory: Negative for cough, chest tightness, shortness of breath, wheezing and stridor.  Cardiovascular: Negative for chest pain, palpitations and leg swelling.  Gastrointestinal: Negative for nausea, vomiting, abdominal pain, diarrhea,  constipation, blood in stool, abdominal distention and anal bleeding.  Genitourinary: Negative for dysuria, hematuria, flank pain and difficulty urinating.  Musculoskeletal: weakness of hands and feet Skin: Negative for color change, pallor, rash and wound.  Neurological: Negative for dizziness, tremors, weakness and light-headedness.  Hematological: Negative for adenopathy. Does not bruise/bleed easily.  Psychiatric/Behavioral: Negative for behavioral problems, confusion, sleep disturbance, dysphoric mood, decreased concentration and agitation.     OBJECTIVE: Temp:  [98.5 F (36.9 C)-100 F (37.8 C)] 98.6 F (37 C) (03/02 1650) Pulse Rate:  [59-87] 59 (03/02 1650) Resp:  [14-17] 15 (03/02 1650) BP: (92-123)/(61-76) 123/76 (03/02 1650) SpO2:  [95 %-100 %] 100 % (03/02 1322) Weight:  [63.9 kg] 63.9 kg (03/02 0038) Physical Exam  Constitutional:  oriented to person, place, and time. appears well-developed and well-nourished. No distress.  HENT: [place]/AT, PERRLA, no scleral icterus Mouth/Throat: Oropharynx is clear and moist. No oropharyngeal exudate. Poor dentition, loose teeth Cardiovascular: Normal rate, regular rhythm and normal heart sounds. Exam reveals no gallop and no friction rub.  No murmur heard.  Pulmonary/Chest: Effort normal and breath sounds normal. No respiratory distress.  has no wheezes.  Neck = supple, no nuchal rigidity Abdominal: Soft. Bowel sounds are normal.  exhibits no distension. There is no tenderness.  Lymphadenopathy: no cervical adenopathy. No axillary adenopathy Neurological: alert and oriented to person, place, and time. 2/5 arm strength bilaterally.down going toes bilaterally Skin: Skin is warm and dry. No rash noted. No erythema.  Psychiatric: a normal mood and affect.  behavior is normal.    LABS: Results for orders placed or performed during the hospital encounter of 12/18/19 (  from the past 48 hour(s))  Basic metabolic panel     Status: Abnormal    Collection Time: 12/20/19  3:25 AM  Result Value Ref Range   Sodium 140 135 - 145 mmol/L   Potassium 3.7 3.5 - 5.1 mmol/L   Chloride 115 (H) 98 - 111 mmol/L   CO2 21 (L) 22 - 32 mmol/L   Glucose, Bld 91 70 - 99 mg/dL    Comment: Glucose reference range applies only to samples taken after fasting for at least 8 hours.   BUN <5 (L) 6 - 20 mg/dL   Creatinine, Ser 4.13 0.44 - 1.00 mg/dL   Calcium 8.0 (L) 8.9 - 10.3 mg/dL   GFR calc non Af Amer >60 >60 mL/min   GFR calc Af Amer >60 >60 mL/min   Anion gap 4 (L) 5 - 15    Comment: Performed at [facility], [address] [place], [place] [address]  CBC     Status: Abnormal   Collection Time: 12/20/19  3:25 AM  Result Value Ref Range   WBC 8.4 4.0 - 10.5 K/uL   RBC 3.06 (L) 3.87 - 5.11 MIL/uL   Hemoglobin 9.3 (L) 12.0 - 15.0 g/dL   HCT 02.7 (L) 25.3 - 66.4 %   MCV 92.5 80.0 - 100.0 fL   MCH 30.4 26.0 - 34.0 pg   MCHC 32.9 30.0 - 36.0 g/dL   RDW 40.3 47.4 - 25.9 %   Platelets 201 150 - 400 K/uL   nRBC 0.0 0.0 - 0.2 %    Comment: Performed at [facility], [address], [place], [place] [address]  Magnesium     Status: None   Collection Time: 12/20/19  3:25 AM  Result Value Ref Range   Magnesium 2.3 1.7 - 2.4 mg/dL    Comment: Performed at [facility], [address] [place], [place] [address]  D-dimer, quantitative (not at [facility])     Status: Abnormal   Collection Time: 12/20/19  7:31 AM  Result Value Ref Range   D-Dimer, Quant 1.74 (H) 0.00 - 0.50 ug/mL-FEU    Comment: (NOTE) At the manufacturer cut-off of 0.50 ug/mL FEU, this assay has been documented to exclude PE with a sensitivity and negative predictive value of 97 to 99%.  At this time, this assay has not been approved by the FDA to exclude DVT/VTE. Results should be correlated with clinical presentation. Performed at [facility], [address], [place], [place] [address]   Glucose, capillary     Status: None   Collection Time: 12/20/19  11:41 AM  Result Value Ref Range   Glucose-Capillary 92 70 - 99 mg/dL    Comment: Glucose reference range applies only to samples taken after fasting for at least 8 hours.  Culture, blood (routine x 2)     Status: None (Preliminary result)   Collection Time: 12/20/19  6:29 PM   Specimen: BLOOD RIGHT WRIST  Result Value Ref Range   Specimen Description BLOOD RIGHT WRIST    Special Requests      BOTTLES DRAWN AEROBIC AND ANAEROBIC Blood Culture adequate volume   Culture NO GROWTH < 24 HOURS    Report Status PENDING   Culture, blood (routine x 2)     Status: None (Preliminary result)   Collection Time: 12/20/19  6:45 PM   Specimen: BLOOD RIGHT HAND  Result Value Ref Range   Specimen Description BLOOD RIGHT HAND  Special Requests      BOTTLES DRAWN AEROBIC AND ANAEROBIC Blood Culture adequate volume   Culture NO GROWTH < 24 HOURS    Report Status PENDING   Heparin level (unfractionated)     Status: None   Collection Time: 12/20/19  6:50 PM  Result Value Ref Range   Heparin Unfractionated 0.43 0.30 - 0.70 IU/mL    Comment: (NOTE) If heparin results are below expected values, and patient dosage has  been confirmed, suggest follow up testing of antithrombin III levels. Performed at [facility], [address] [place], [place] [address]   Basic metabolic panel     Status: Abnormal   Collection Time: 12/21/19  4:15 AM  Result Value Ref Range   Sodium 141 135 - 145 mmol/L   Potassium 3.8 3.5 - 5.1 mmol/L   Chloride 113 (H) 98 - 111 mmol/L   CO2 21 (L) 22 - 32 mmol/L   Glucose, Bld 80 70 - 99 mg/dL    Comment: Glucose reference range applies only to samples taken after fasting for at least 8 hours.   BUN 6 6 - 20 mg/dL   Creatinine, Ser 6.040.80 0.44 - 1.00 mg/dL   Calcium 8.2 (L) 8.9 - 10.3 mg/dL   GFR calc non Af Amer >60 >60 mL/min   GFR calc Af Amer >60 >60 mL/min   Anion gap 7 5 - 15    Comment: Performed at [facility], [address] [place], [place] [address]   CBC     Status: Abnormal   Collection Time: 12/21/19  4:15 AM  Result Value Ref Range   WBC 5.9 4.0 - 10.5 K/uL   RBC 3.29 (L) 3.87 - 5.11 MIL/uL   Hemoglobin 9.9 (L) 12.0 - 15.0 g/dL   HCT 11.929.9 (L) 14.736.0 - 82.946.0 %   MCV 90.9 80.0 - 100.0 fL   MCH 30.1 26.0 - 34.0 pg   MCHC 33.1 30.0 - 36.0 g/dL   RDW 56.212.2 13.011.5 - 86.515.5 %   Platelets 180 150 - 400 K/uL   nRBC 0.0 0.0 - 0.2 %    Comment: Performed at [facility], [address] [place], [place] [address]  Magnesium     Status: None   Collection Time: 12/21/19  4:15 AM  Result Value Ref Range   Magnesium 1.9 1.7 - 2.4 mg/dL    Comment: Performed at [facility], [address] [place], [place] [address]  Heparin level (unfractionated)     Status: Abnormal   Collection Time: 12/21/19  4:15 AM  Result Value Ref Range   Heparin Unfractionated 1.06 (H) 0.30 - 0.70 IU/mL    Comment: RESULTS CONFIRMED BY MANUAL DILUTION (NOTE) If heparin results are below expected values, and patient dosage has  been confirmed, suggest follow up testing of antithrombin III levels. Performed at [facility], [address] [place], [place] [address]   Heparin level (unfractionated)     Status: Abnormal   Collection Time: 12/21/19  2:00 PM  Result Value Ref Range   Heparin Unfractionated 0.89 (H) 0.30 - 0.70 IU/mL    Comment: (NOTE) If heparin results are below expected values, and patient dosage has  been confirmed, suggest follow up testing of antithrombin III levels. Performed at [facility], [address] [place], [place] [address]     MICRO:  IMAGING: CT ANGIO CHEST PE W OR WO CONTRAST  Result Date: 12/20/2019 CLINICAL DATA:  54 year old female with history of syncope. Elevated D-dimer. Chest pain. EXAM: CT ANGIOGRAPHY CHEST WITH CONTRAST TECHNIQUE: Multidetector CT imaging of the chest was performed using the standard protocol during bolus administration of intravenous contrast. Multiplanar CT image  reconstructions and MIPs were obtained to evaluate the vascular anatomy. CONTRAST:  80mL OMNIPAQUE IOHEXOL 350 MG/ML SOLN COMPARISON:  Chest CT 12/18/2019. FINDINGS: Cardiovascular: There are small filling defects within the pulmonary arteries bilaterally involving segmental sized branches in the left lower lobe and subsegmental sized branches in the right lower lobe. These are nonocclusive. No larger central or lobar sized filling defects are noted. Heart size is normal. There is no significant pericardial fluid, thickening or pericardial calcification. There is aortic atherosclerosis, as well as atherosclerosis of the great vessels of the mediastinum and the coronary arteries, including calcified atherosclerotic plaque in the left anterior descending coronary artery. Mediastinum/Nodes: No pathologically enlarged mediastinal or hilar lymph nodes. Esophagus is unremarkable in appearance. No axillary lymphadenopathy. Lungs/Pleura: Dependent areas of subsegmental atelectasis are noted in lower lobes of the lungs bilaterally. No acute consolidative airspace disease. No pleural effusions. Upper Abdomen: Intermediate attenuation material lying dependently in the gallbladder, presumably biliary sludge. Musculoskeletal: There are no aggressive appearing lytic or blastic lesions noted in the visualized portions of the skeleton. Review of the MIP images confirms the above findings. IMPRESSION: 1. Small nonocclusive pulmonary emboli bilaterally involving segmental and subsegmental sized vessels, as above. 2. Aortic atherosclerosis, in addition to left anterior descending coronary artery disease. Please note that although the presence of coronary artery calcium documents the presence of coronary artery disease, the severity of this disease and any potential stenosis cannot be assessed on this non-gated CT examination. Assessment for potential risk factor modification, dietary therapy or pharmacologic therapy may be warranted,  if clinically indicated. 3. Biliary sludge in the gallbladder. These results will be called to the ordering clinician or representative by the Radiologist Assistant, and communication documented in the PACS or zVision Dashboard. Aortic Atherosclerosis (ICD10-I70.0). Electronically Signed   By: Trudie Reedaniel  Entrikin M.D.   On: 12/20/2019 10:53   MR BRAIN WO CONTRAST  Result Date: 12/20/2019 CLINICAL DATA:  Neuro deficit, subacute. Additional history provided: Syncope, generalized fatigue, poor p.o. intake. EXAM: MRI HEAD WITHOUT CONTRAST TECHNIQUE: Multiplanar, multiecho pulse sequences of the brain and surrounding structures were obtained without intravenous contrast. COMPARISON:  Head CT 12/18/2019. FINDINGS: Brain: The examination is intermittently motion degraded. Most notably there is mild-to-moderate motion degradation of the axial T2 FLAIR sequence and moderate/severe motion degradation of the coronal T2 weighted sequence. There is no evidence of acute infarct. No evidence of intracranial mass. No midline shift or extra-axial fluid collection. No chronic intracranial blood products. Mild scattered T2/FLAIR hyperintensity within the cerebral white matter is nonspecific, but most commonly seen on the basis of chronic small vessel ischemic disease. Mild generalized parenchymal atrophy. Incidentally noted cavum septum pellucidum and cavum vergae. Vascular: Visualized orbits demonstrate no acute abnormality. Skull and upper cervical spine: No focal marrow lesion. Sinuses/Orbits: Visualized orbits demonstrate no acute abnormality. Mild scattered paranasal sinus mucosal thickening greatest within bilateral ethmoid air cells. No significant mastoid effusion. IMPRESSION: 1. Intermittently motion degraded exam. 2. No evidence of acute intracranial abnormality. 3. Mild scattered T2 hyperintense signal changes within the cerebral white matter are nonspecific, but most commonly seen on the basis of chronic small vessel  ischemic disease. 4. Mild generalized parenchymal atrophy. 5. Mild paranasal sinus mucosal thickening. Electronically Signed   By: Jackey LogeKyle  Golden DO   On:  12/20/2019 16:57   MR CERVICAL SPINE WO CONTRAST  Result Date: 12/20/2019 CLINICAL DATA:  Spinal stenosis, cervical spine. EXAM: MRI CERVICAL SPINE WITHOUT CONTRAST TECHNIQUE: Multiplanar, multisequence MR imaging of the cervical spine was performed. No intravenous contrast was administered. COMPARISON:  CT cervical spine 12/18/2019. FINDINGS: Mildly motion degraded examination Alignment: Mild reversal of the expected cervical lordosis. No significant spondylolisthesis. Vertebrae: Vertebral body height is maintained. No significant marrow edema or suspicious osseous lesion. Multilevel degenerative endplate irregularity and mixed degenerative endplate marrow signal. This includes mild degenerative endplate edema at O7-S9. Cord: There is prominent T2/STIR hyperintense cord signal abnormality at the C4 through C6 levels. The cord signal abnormality involves the entire cord at the lower C5 level. Posterior Fossa, vertebral arteries, paraspinal tissues: No abnormality identified within included portions of the posterior fossa. Flow voids preserved within the cervical vertebral arteries. There is STIR hyperintense signal within the interspinous spaces at the C3-C4 through T1-T2 levels. Disc levels: Moderate/severe C3-C4 disc degeneration. Moderate disc degeneration at the remaining cervical levels. C2-C3: No disc herniation. No significant canal or foraminal stenosis. C3-C4: Posterior disc osteophyte complex. Uncinate/facet hypertrophy. Mild spinal canal stenosis. There is contact upon the ventral spinal cord with minimal flattening of the ventral cord. Bilateral neural foraminal narrowing (moderate right, moderate/severe left). C4-C5: Posterior disc osteophyte complex. Uncinate/facet hypertrophy. Severe spinal canal stenosis with mild spinal cord flattening. Bilateral  neural foraminal narrowing (moderate right, severe left). C5-C6: Posterior disc osteophyte complex. Uncinate/facet hypertrophy. Mild spinal canal stenosis. Severe bilateral neural foraminal narrowing. C6-C7: Posterior disc osteophyte complex. Uncinate/facet hypertrophy. Mild spinal canal stenosis. Severe bilateral neural foraminal narrowing. C7-T1: No disc herniation. No significant canal or foraminal stenosis. IMPRESSION: Prominent spinal cord signal abnormality at the C4 through C6 levels. There is involvement of the entire cord cross-section at the lower C5 level. Differential considerations include spinal cord infarct, contusion, demyelinating disease or other inflammatory/infectious process. Edema signal within the interspinous spaces at the C3-T2 levels, which may reflect interspinous ligament injury. Cervical spondylosis as described and most notably as follows. At C4-C5, a posterior disc osteophyte complex contributes to severe spinal canal stenosis with mild spinal cord flattening. Bilateral neural foraminal narrowing (moderate right, severe left) No more than mild spinal canal stenosis at the remaining levels. Additional sites of neural foraminal narrowing, including site of severe and moderate/severe neural foraminal narrowing as detailed. Electronically Signed   By: Jackey Loge DO   On: 12/20/2019 17:32   MR THORACIC SPINE WO CONTRAST  Result Date: 12/20/2019 CLINICAL DATA:  Distal greater than proximal weakness, trauma/fall at home prior to admission. EXAM: MRI THORACIC SPINE WITHOUT CONTRAST TECHNIQUE: Multiplanar, multisequence MR imaging of the thoracic spine was performed. No intravenous contrast was administered. COMPARISON:  CT chest 12/18/2019 FINDINGS: Mildly motion degraded examination. Alignment:  Alignment is maintained. Vertebrae: Vertebral body height is maintained. No significant marrow edema or suspicious osseous lesion. Cord:  No spinal cord signal abnormality is identified.  Paraspinal and other soft tissues: Mild nonspecific edema within the dorsal subcutaneous soft tissues overlying the lower thoracic and visualized upper lumbar spine. No abnormality identified within included portions of the thorax or upper abdomen. Disc levels: Mild disc degeneration throughout the thoracic spine. Mild multilevel disc bulges and facet arthrosis/ligamentum flavum hypertrophy. No significant spinal canal stenosis at any level. No compressive foraminal stenosis. IMPRESSION: Mildly motion degraded examination. No spinal cord signal abnormality is identified. Mild thoracic spondylosis with no significant spinal canal stenosis, and no compressive foraminal narrowing. Electronically Signed  By: Kellie Simmering DO   On: 12/20/2019 17:59   VAS [place] LOWER EXTREMITY VENOUS (DVT)  Result Date: 12/20/2019  Lower Venous DVTStudy Indications: Pulmonary embolism.  Anticoagulation: Heparin. Comparison Study: No prior exam. Performing Technologist: Baldwin Crown ARDMS, RVT  Examination Guidelines: A complete evaluation includes B-mode imaging, spectral Doppler, color Doppler, and power Doppler as needed of all accessible portions of each vessel. Bilateral testing is considered an integral part of a complete examination. Limited examinations for reoccurring indications may be performed as noted. The reflux portion of the exam is performed with the patient in reverse Trendelenburg.  +---------+---------------+---------+-----------+----------+--------------+ RIGHT    CompressibilityPhasicitySpontaneityPropertiesThrombus Aging +---------+---------------+---------+-----------+----------+--------------+ CFV      Full           Yes      Yes                                 +---------+---------------+---------+-----------+----------+--------------+ SFJ      Full                                                        +---------+---------------+---------+-----------+----------+--------------+ FV Prox   Full                                                        +---------+---------------+---------+-----------+----------+--------------+ FV Mid   Full                                                        +---------+---------------+---------+-----------+----------+--------------+ FV DistalFull                                                        +---------+---------------+---------+-----------+----------+--------------+ PFV      Full                                                        +---------+---------------+---------+-----------+----------+--------------+ POP      Full           Yes      Yes                                 +---------+---------------+---------+-----------+----------+--------------+ PTV      Full                                                        +---------+---------------+---------+-----------+----------+--------------+ PERO  Full                                                        +---------+---------------+---------+-----------+----------+--------------+   +---------+---------------+---------+-----------+----------+--------------+ LEFT     CompressibilityPhasicitySpontaneityPropertiesThrombus Aging +---------+---------------+---------+-----------+----------+--------------+ CFV      Full           Yes      Yes                                 +---------+---------------+---------+-----------+----------+--------------+ SFJ      Full                                                        +---------+---------------+---------+-----------+----------+--------------+ FV Prox  Full                                                        +---------+---------------+---------+-----------+----------+--------------+ FV Mid   Full                                                        +---------+---------------+---------+-----------+----------+--------------+ FV DistalFull                                                         +---------+---------------+---------+-----------+----------+--------------+ PFV      Full                                                        +---------+---------------+---------+-----------+----------+--------------+ POP      Full           Yes      Yes                                 +---------+---------------+---------+-----------+----------+--------------+ PTV      Full                                                        +---------+---------------+---------+-----------+----------+--------------+ PERO     Full                                                        +---------+---------------+---------+-----------+----------+--------------+  Summary: BILATERAL: - No evidence of deep vein thrombosis seen in the lower extremities, bilaterally.  RIGHT: - No cystic structure found in the popliteal fossa.  LEFT: - No cystic structure found in the popliteal fossa.  *See table(s) above for measurements and observations. Electronically signed by Fabienne Bruns MD on 12/20/2019 at 5:54:34 PM.    Final     Assessment/Plan: 54yo F admitted for syncope in the setting of hypotension, leukocytosis/syncope -found to have streptococcal bacteremia possibly odontic source.  - continue on ceftriaxone 2gm iv daily. Awaiting sensitivity of isolate - plan to treat for 2 wks as long as repeat blood cx are negative - consider dental evaluation while hospitalized for dental extraction  Cervical stenosis = if neuro exam continues to worsen may be worth doing surgery earlier than later  Health maintenance = recommend to check hep C ab  [place] B. Drue Second MD MPH [facility] [phone]

## 2019-12-21 NOTE — Progress Notes (Signed)
PROGRESS NOTE    Anna Campos  OLI:[id number] DOB: July 17, 1966 DOA: 12/18/2019 PCP: Farris Has, MD     Brief Narrative:  Anna Campos is a 54 year old female with no significant past medical history presenting with syncope.  She has been having generalized fatigue.  She has had poor p.o. intake and has not eaten in the last day.  She stood up this evening and passed out.  Family heard her fall.  She did hit her head.  She was told she was not unconscious for a long period of time.  She was noted by EMS to be hypotensive and she was given a small fluid bolus.  She was admitted to ICU due to syncope, hypovolemic shock.  Due to EKG changes, cardiology was consulted.  Patient was weaned off Levophed and transferred to [facility] on 3/1.  New events last 24 hours / Subjective: No new complaints today. Continues to have weakness in her extremities.   Assessment & Plan:   Active Problems:   Syncope   Hypovolemic shock and syncope -Admitted to ICU and now weaned off of Levophed.  Cortisol 23.5 -Appreciate cardiology; EKG shows nonspecific findings and pretest probability for CAD is low.  Troponin negative.  Echocardiogram with normal LVEF, normal RV size and function without valvular abnormalities.  No sign of right heart strain. No further cardiac work-up recommended -BP stable, 103/62 this morning  Strep constellatus bacteremia -Consulted ID -On vancomycin currently -Repeat blood cultures 3/1  -Patient unable to sit up straight for orthopantogram. CT maxillofacial completed on 2/27 without mention of any dental caries or abscess   PE -DVT US negative -IV heparin   Bilateral extremity weakness with incomplete spinal cord injury C3-C6 -Distal lower extremity weaker compared to proximal -Consulted neurosurgery. Patient will eventually need multilevel cervical decompressive surgery once medically stabliized -PT OT, recommend CIR     DVT prophylaxis: IV heparin Code  Status: Full code Family Communication: None at bedside  Disposition Plan:  . Patient is from home prior to admission. . Currently in-hospital treatment needed due to further work-up and plan for bacteremia. Will need to transition to DOAC prior to discharge  . Suspect patient will discharge CIR    Consultants:   PCCM admission  Cardiology  ID   Neurosurgery   Procedures:   None  Antimicrobials:  Anti-infectives (From admission, onward)   Start     Dose/Rate Route Frequency Ordered Stop   12/21/19 1000  cefTRIAXone (ROCEPHIN) 2 g in sodium chloride 0.9 % 100 mL IVPB     2 g 200 mL/hr over 30 Minutes Intravenous Every 24 hours 12/21/19 0948     12/20/19 2130  vancomycin (VANCOREADY) IVPB 750 mg/150 mL  Status:  Discontinued     750 mg 150 mL/hr over 60 Minutes Intravenous Every 12 hours 12/20/19 1825 12/21/19 0948   12/20/19 1530  ceFAZolin (ANCEF) IVPB 2g/100 mL premix  Status:  Discontinued     2 g 200 mL/hr over 30 Minutes Intravenous Every 8 hours 12/20/19 1526 12/20/19 1814   12/20/19 1000  vancomycin (VANCOREADY) IVPB 750 mg/150 mL  Status:  Discontinued     750 mg 150 mL/hr over 60 Minutes Intravenous Every 12 hours 12/20/19 0054 12/20/19 1019   12/20/19 0130  vancomycin (VANCOREADY) IVPB 1250 mg/250 mL     1,250 mg 166.7 mL/hr over 90 Minutes Intravenous  Once 12/20/19 0054 12/20/19 0356       Objective: Vitals:   12/20/19 2045 12/21/19 0038 12/21/19 0344  12/21/19 0843  BP: 92/61 114/71 105/75 103/62  Pulse: 87 63 70 78  Resp: 16 15 17 17   Temp: 100 F (37.8 C) 99 F (37.2 C) 99.3 F (37.4 C) 98.5 F (36.9 C)  TempSrc: Oral Oral Oral Oral  SpO2: 95% 99% 98% 96%  Weight:  63.9 kg    Height:        Intake/Output Summary (Last 24 hours) at 12/21/2019 1024 Last data filed at 12/21/2019 02/20/2020 Gross per 24 hour  Intake 673.25 ml  Output 1200 ml  Net -526.75 ml   Filed Weights   12/19/19 1408 12/20/19 0450 12/21/19 0038  Weight: 61.4 kg 63.7 kg 63.9 kg     Examination: General exam: Appears calm and comfortable  Respiratory system: Clear to auscultation. Respiratory effort normal. Cardiovascular system: S1 & S2 heard, RRR. No pedal edema. Gastrointestinal system: Abdomen is nondistended, soft and nontender. Normal bowel sounds heard. Central nervous system: Alert and oriented. Speech clear  Extremities: Symmetric in appearance bilaterally  Skin: No rashes, lesions or ulcers on exposed skin  Psychiatry: Judgement and insight appear stable. Mood & affect appropriate.    Data Reviewed: I have personally reviewed following labs and imaging studies  CBC: Recent Labs  Lab 12/18/19 2230 12/18/19 2230 12/18/19 2241 12/19/19 0155 12/19/19 1046 12/20/19 0325 12/21/19 0415  WBC 7.9  --   --  10.8* 15.2* 8.4 5.9  NEUTROABS  --   --   --   --  13.7*  --   --   HGB 10.7*   < > 10.5* 10.9* 10.1* 9.3* 9.9*  HCT 34.0*   < > 31.0* 35.2* 31.2* 28.3* 29.9*  MCV 94.2  --   --  97.0 92.6 92.5 90.9  PLT 260  --   --  279 239 201 180   < > = values in this interval not displayed.   Basic Metabolic Panel: Recent Labs  Lab 12/18/19 2230 12/18/19 2241 12/19/19 0155 12/20/19 0325 12/21/19 0415  NA 139 141 140 140 141  K 3.3* 3.3* 3.7 3.7 3.8  CL 111 106 114* 115* 113*  CO2 22  --  19* 21* 21*  GLUCOSE 130* 124* 124* 91 80  BUN 7 6 5* <5* 6  CREATININE 1.19* 1.10* 0.77 0.79 0.80  CALCIUM 8.2*  --  7.8* 8.0* 8.2*  MG 1.6*  --  1.4* 2.3 1.9  PHOS  --   --  1.9*  --   --    GFR: Estimated Creatinine Clearance: 83 mL/min (by C-G formula based on SCr of 0.8 mg/dL). Liver Function Tests: Recent Labs  Lab 12/18/19 2230  AST 19  ALT 11  ALKPHOS 130*  BILITOT 0.6  PROT 5.6*  ALBUMIN 3.1*   Recent Labs  Lab 12/19/19 0155  LIPASE 25   No results for input(s): AMMONIA in the last 168 hours. Coagulation Profile: Recent Labs  Lab 12/18/19 2230  INR 1.1   Cardiac Enzymes: Recent Labs  Lab 12/19/19 0155  CKTOTAL 370*   BNP  (last 3 results) No results for input(s): PROBNP in the last 8760 hours. HbA1C: Recent Labs    12/19/19 0155  HGBA1C 5.0   CBG: Recent Labs  Lab 12/19/19 0617 12/20/19 1141  GLUCAP 113* 92   Lipid Profile: No results for input(s): CHOL, HDL, LDLCALC, TRIG, CHOLHDL, LDLDIRECT in the last 72 hours. Thyroid Function Tests: Recent Labs    12/18/19 2356  TSH 2.352   Anemia Panel: No results for input(s): VITAMINB12,  FOLATE, FERRITIN, TIBC, IRON, RETICCTPCT in the last 72 hours. Sepsis Labs: Recent Labs  Lab 12/18/19 2230  LATICACIDVEN 1.8    Recent Results (from the past 240 hour(s))  Blood culture (routine x 2)     Status: Abnormal (Preliminary result)   Collection Time: 12/18/19 10:30 PM   Specimen: BLOOD LEFT ARM  Result Value Ref Range Status   Specimen Description BLOOD LEFT ARM  Final   Special Requests   Final    BOTTLES DRAWN AEROBIC AND ANAEROBIC Blood Culture results may not be optimal due to an excessive volume of blood received in culture bottles   Culture  Setup Time   Final    GRAM POSITIVE COCCI IN CHAINS IN BOTH AEROBIC AND ANAEROBIC BOTTLES CRITICAL RESULT CALLED TO, READ BACK BY AND VERIFIED WITH: K. HURTH,PHARMD 2221 12/19/2019 T. TYSOR    Culture (A)  Final    STREPTOCOCCUS CONSTELLATUS REPEATING SUSCEPTIBILITIES Performed at [facility], [address], [place], [place] [address]    Report Status PENDING  Incomplete  Respiratory Panel by RT PCR (Flu A&B, Covid) - Nasopharyngeal Swab     Status: None   Collection Time: 12/18/19 11:05 PM   Specimen: Nasopharyngeal Swab  Result Value Ref Range Status   SARS Coronavirus 2 by RT PCR NEGATIVE NEGATIVE Final    Comment: (NOTE) SARS-CoV-2 target nucleic acids are NOT DETECTED. The SARS-CoV-2 RNA is generally detectable in upper respiratoy specimens during the acute phase of infection. The lowest concentration of SARS-CoV-2 viral copies this assay can detect is 131 copies/mL. A negative  result does not preclude SARS-Cov-2 infection and should not be used as the sole basis for treatment or other patient management decisions. A negative result may occur with  improper specimen collection/handling, submission of specimen other than nasopharyngeal swab, presence of viral mutation(s) within the areas targeted by this assay, and inadequate number of viral copies (<131 copies/mL). A negative result must be combined with clinical observations, patient history, and epidemiological information. The expected result is Negative. Fact Sheet for Patients:  https://www.moore.com/ Fact Sheet for Healthcare Providers:  https://www.young.biz/ This test is not yet ap proved or cleared by the [place] FDA and  has been authorized for detection and/or diagnosis of SARS-CoV-2 by FDA under an Emergency Use Authorization (EUA). This EUA will remain  in effect (meaning this test can be used) for the duration of the COVID-19 declaration under Section 564(b)(1) of the Act, 21 U.S.C. section 360bbb-3(b)(1), unless the authorization is terminated or revoked sooner.    Influenza A by PCR NEGATIVE NEGATIVE Final   Influenza B by PCR NEGATIVE NEGATIVE Final    Comment: (NOTE) The Xpert Xpress SARS-CoV-2/FLU/RSV assay is intended as an aid in  the diagnosis of influenza from Nasopharyngeal swab specimens and  should not be used as a sole basis for treatment. Nasal washings and  aspirates are unacceptable for Xpert Xpress SARS-CoV-2/FLU/RSV  testing. Fact Sheet for Patients: https://www.moore.com/ Fact Sheet for Healthcare Providers: https://www.young.biz/ This test is not yet approved or cleared by the [place] FDA and  has been authorized for detection and/or diagnosis of SARS-CoV-2 by  FDA under an Emergency Use Authorization (EUA). This EUA will remain  in effect (meaning this test can be used) for the  duration of the  Covid-19 declaration under Section 564(b)(1) of the Act, 21  U.S.C. section 360bbb-3(b)(1), unless the authorization is  terminated or revoked. Performed at [facility], [address], [place], [place] [address]  Blood culture (routine x 2)     Status: None (Preliminary result)   Collection Time: 12/19/19  3:31 AM   Specimen: BLOOD RIGHT WRIST  Result Value Ref Range Status   Specimen Description BLOOD RIGHT WRIST  Final   Special Requests   Final    BOTTLES DRAWN AEROBIC AND ANAEROBIC Blood Culture adequate volume   Culture   Final    NO GROWTH 2 DAYS Performed at [facility], [address] [place], [place] [address]    Report Status PENDING  Incomplete  MRSA PCR Screening     Status: None   Collection Time: 12/19/19  6:16 AM   Specimen: Nasal Mucosa; Nasopharyngeal  Result Value Ref Range Status   MRSA by PCR NEGATIVE NEGATIVE Final    Comment:        The GeneXpert MRSA Assay (FDA approved for NASAL specimens only), is one component of a comprehensive MRSA colonization surveillance program. It is not intended to diagnose MRSA infection nor to guide or monitor treatment for MRSA infections. Performed at [facility], [place] [address], [place], [place] [address]   Culture, blood (routine x 2)     Status: None (Preliminary result)   Collection Time: 12/20/19  6:29 PM   Specimen: BLOOD RIGHT WRIST  Result Value Ref Range Status   Specimen Description BLOOD RIGHT WRIST  Final   Special Requests   Final    BOTTLES DRAWN AEROBIC AND ANAEROBIC Blood Culture adequate volume   Culture   Final    NO GROWTH < 12 HOURS Performed at [facility], [place] [address] [place], [place] [address]    Report Status PENDING  Incomplete  Culture, blood (routine x 2)     Status: None (Preliminary result)   Collection Time: 12/20/19  6:45 PM   Specimen: BLOOD RIGHT HAND  Result Value Ref Range Status   Specimen Description BLOOD RIGHT HAND  Final    Special Requests   Final    BOTTLES DRAWN AEROBIC AND ANAEROBIC Blood Culture adequate volume   Culture   Final    NO GROWTH < 12 HOURS Performed at [facility], [place] [address] [place], [place] [address]    Report Status PENDING  Incomplete      Radiology Studies: CT ANGIO CHEST PE W OR WO CONTRAST  Result Date: 12/20/2019 CLINICAL DATA:  54 year old female with history of syncope. Elevated D-dimer. Chest pain. EXAM: CT ANGIOGRAPHY CHEST WITH CONTRAST TECHNIQUE: Multidetector CT imaging of the chest was performed using the standard protocol during bolus administration of intravenous contrast. Multiplanar CT image reconstructions and MIPs were obtained to evaluate the vascular anatomy. CONTRAST:  86mL OMNIPAQUE IOHEXOL 350 MG/ML SOLN COMPARISON:  Chest CT 12/18/2019. FINDINGS: Cardiovascular: There are small filling defects within the pulmonary arteries bilaterally involving segmental sized branches in the left lower lobe and subsegmental sized branches in the right lower lobe. These are nonocclusive. No larger central or lobar sized filling defects are noted. Heart size is normal. There is no significant pericardial fluid, thickening or pericardial calcification. There is aortic atherosclerosis, as well as atherosclerosis of the great vessels of the mediastinum and the coronary arteries, including calcified atherosclerotic plaque in the left anterior descending coronary artery. Mediastinum/Nodes: No pathologically enlarged mediastinal or hilar lymph nodes. Esophagus is unremarkable in appearance. No axillary lymphadenopathy. Lungs/Pleura: Dependent areas of subsegmental atelectasis are noted in lower lobes of the lungs bilaterally. No acute consolidative airspace disease. No pleural effusions.  Upper Abdomen: Intermediate attenuation material lying dependently in the gallbladder, presumably biliary sludge. Musculoskeletal: There are no aggressive appearing lytic or blastic lesions noted in the  visualized portions of the skeleton. Review of the MIP images confirms the above findings. IMPRESSION: 1. Small nonocclusive pulmonary emboli bilaterally involving segmental and subsegmental sized vessels, as above. 2. Aortic atherosclerosis, in addition to left anterior descending coronary artery disease. Please note that although the presence of coronary artery calcium documents the presence of coronary artery disease, the severity of this disease and any potential stenosis cannot be assessed on this non-gated CT examination. Assessment for potential risk factor modification, dietary therapy or pharmacologic therapy may be warranted, if clinically indicated. 3. Biliary sludge in the gallbladder. These results will be called to the ordering clinician or representative by the Radiologist Assistant, and communication documented in the PACS or zVision Dashboard. Aortic Atherosclerosis (ICD10-I70.0). Electronically Signed   By: Trudie Reed M.D.   On: 12/20/2019 10:53   MR BRAIN WO CONTRAST  Result Date: 12/20/2019 CLINICAL DATA:  Neuro deficit, subacute. Additional history provided: Syncope, generalized fatigue, poor p.o. intake. EXAM: MRI HEAD WITHOUT CONTRAST TECHNIQUE: Multiplanar, multiecho pulse sequences of the brain and surrounding structures were obtained without intravenous contrast. COMPARISON:  Head CT 12/18/2019. FINDINGS: Brain: The examination is intermittently motion degraded. Most notably there is mild-to-moderate motion degradation of the axial T2 FLAIR sequence and moderate/severe motion degradation of the coronal T2 weighted sequence. There is no evidence of acute infarct. No evidence of intracranial mass. No midline shift or extra-axial fluid collection. No chronic intracranial blood products. Mild scattered T2/FLAIR hyperintensity within the cerebral white matter is nonspecific, but most commonly seen on the basis of chronic small vessel ischemic disease. Mild generalized parenchymal  atrophy. Incidentally noted cavum septum pellucidum and cavum vergae. Vascular: Visualized orbits demonstrate no acute abnormality. Skull and upper cervical spine: No focal marrow lesion. Sinuses/Orbits: Visualized orbits demonstrate no acute abnormality. Mild scattered paranasal sinus mucosal thickening greatest within bilateral ethmoid air cells. No significant mastoid effusion. IMPRESSION: 1. Intermittently motion degraded exam. 2. No evidence of acute intracranial abnormality. 3. Mild scattered T2 hyperintense signal changes within the cerebral white matter are nonspecific, but most commonly seen on the basis of chronic small vessel ischemic disease. 4. Mild generalized parenchymal atrophy. 5. Mild paranasal sinus mucosal thickening. Electronically Signed   By: Jackey Loge DO   On: 12/20/2019 16:57   MR CERVICAL SPINE WO CONTRAST  Result Date: 12/20/2019 CLINICAL DATA:  Spinal stenosis, cervical spine. EXAM: MRI CERVICAL SPINE WITHOUT CONTRAST TECHNIQUE: Multiplanar, multisequence MR imaging of the cervical spine was performed. No intravenous contrast was administered. COMPARISON:  CT cervical spine 12/18/2019. FINDINGS: Mildly motion degraded examination Alignment: Mild reversal of the expected cervical lordosis. No significant spondylolisthesis. Vertebrae: Vertebral body height is maintained. No significant marrow edema or suspicious osseous lesion. Multilevel degenerative endplate irregularity and mixed degenerative endplate marrow signal. This includes mild degenerative endplate edema at N8-M7. Cord: There is prominent T2/STIR hyperintense cord signal abnormality at the C4 through C6 levels. The cord signal abnormality involves the entire cord at the lower C5 level. Posterior Fossa, vertebral arteries, paraspinal tissues: No abnormality identified within included portions of the posterior fossa. Flow voids preserved within the cervical vertebral arteries. There is STIR hyperintense signal within the  interspinous spaces at the C3-C4 through T1-T2 levels. Disc levels: Moderate/severe C3-C4 disc degeneration. Moderate disc degeneration at the remaining cervical levels. C2-C3: No disc herniation. No significant canal or foraminal stenosis. C3-C4:  Posterior disc osteophyte complex. Uncinate/facet hypertrophy. Mild spinal canal stenosis. There is contact upon the ventral spinal cord with minimal flattening of the ventral cord. Bilateral neural foraminal narrowing (moderate right, moderate/severe left). C4-C5: Posterior disc osteophyte complex. Uncinate/facet hypertrophy. Severe spinal canal stenosis with mild spinal cord flattening. Bilateral neural foraminal narrowing (moderate right, severe left). C5-C6: Posterior disc osteophyte complex. Uncinate/facet hypertrophy. Mild spinal canal stenosis. Severe bilateral neural foraminal narrowing. C6-C7: Posterior disc osteophyte complex. Uncinate/facet hypertrophy. Mild spinal canal stenosis. Severe bilateral neural foraminal narrowing. C7-T1: No disc herniation. No significant canal or foraminal stenosis. IMPRESSION: Prominent spinal cord signal abnormality at the C4 through C6 levels. There is involvement of the entire cord cross-section at the lower C5 level. Differential considerations include spinal cord infarct, contusion, demyelinating disease or other inflammatory/infectious process. Edema signal within the interspinous spaces at the C3-T2 levels, which may reflect interspinous ligament injury. Cervical spondylosis as described and most notably as follows. At C4-C5, a posterior disc osteophyte complex contributes to severe spinal canal stenosis with mild spinal cord flattening. Bilateral neural foraminal narrowing (moderate right, severe left) No more than mild spinal canal stenosis at the remaining levels. Additional sites of neural foraminal narrowing, including site of severe and moderate/severe neural foraminal narrowing as detailed. Electronically Signed   By:  Jackey LogeKyle  Golden DO   On: 12/20/2019 17:32   MR THORACIC SPINE WO CONTRAST  Result Date: 12/20/2019 CLINICAL DATA:  Distal greater than proximal weakness, trauma/fall at home prior to admission. EXAM: MRI THORACIC SPINE WITHOUT CONTRAST TECHNIQUE: Multiplanar, multisequence MR imaging of the thoracic spine was performed. No intravenous contrast was administered. COMPARISON:  CT chest 12/18/2019 FINDINGS: Mildly motion degraded examination. Alignment:  Alignment is maintained. Vertebrae: Vertebral body height is maintained. No significant marrow edema or suspicious osseous lesion. Cord:  No spinal cord signal abnormality is identified. Paraspinal and other soft tissues: Mild nonspecific edema within the dorsal subcutaneous soft tissues overlying the lower thoracic and visualized upper lumbar spine. No abnormality identified within included portions of the thorax or upper abdomen. Disc levels: Mild disc degeneration throughout the thoracic spine. Mild multilevel disc bulges and facet arthrosis/ligamentum flavum hypertrophy. No significant spinal canal stenosis at any level. No compressive foraminal stenosis. IMPRESSION: Mildly motion degraded examination. No spinal cord signal abnormality is identified. Mild thoracic spondylosis with no significant spinal canal stenosis, and no compressive foraminal narrowing. Electronically Signed   By: Jackey LogeKyle  Golden DO   On: 12/20/2019 17:59   VAS US LOWER EXTREMITY VENOUS (DVT)  Result Date: 12/20/2019  Lower Venous DVTStudy Indications: Pulmonary embolism.  Anticoagulation: Heparin. Comparison Study: No prior exam. Performing Technologist: Kennedy BuckerKristy Siebrecht ARDMS, RVT  Examination Guidelines: A complete evaluation includes B-mode imaging, spectral Doppler, color Doppler, and power Doppler as needed of all accessible portions of each vessel. Bilateral testing is considered an integral part of a complete examination. Limited examinations for reoccurring indications may be performed  as noted. The reflux portion of the exam is performed with the patient in reverse Trendelenburg.  +---------+---------------+---------+-----------+----------+--------------+ RIGHT    CompressibilityPhasicitySpontaneityPropertiesThrombus Aging +---------+---------------+---------+-----------+----------+--------------+ CFV      Full           Yes      Yes                                 +---------+---------------+---------+-----------+----------+--------------+ SFJ      Full                                                        +---------+---------------+---------+-----------+----------+--------------+  FV Prox  Full                                                        +---------+---------------+---------+-----------+----------+--------------+ FV Mid   Full                                                        +---------+---------------+---------+-----------+----------+--------------+ FV DistalFull                                                        +---------+---------------+---------+-----------+----------+--------------+ PFV      Full                                                        +---------+---------------+---------+-----------+----------+--------------+ POP      Full           Yes      Yes                                 +---------+---------------+---------+-----------+----------+--------------+ PTV      Full                                                        +---------+---------------+---------+-----------+----------+--------------+ PERO     Full                                                        +---------+---------------+---------+-----------+----------+--------------+   +---------+---------------+---------+-----------+----------+--------------+ LEFT     CompressibilityPhasicitySpontaneityPropertiesThrombus Aging +---------+---------------+---------+-----------+----------+--------------+ CFV      Full            Yes      Yes                                 +---------+---------------+---------+-----------+----------+--------------+ SFJ      Full                                                        +---------+---------------+---------+-----------+----------+--------------+ FV Prox  Full                                                        +---------+---------------+---------+-----------+----------+--------------+  FV Mid   Full                                                        +---------+---------------+---------+-----------+----------+--------------+ FV DistalFull                                                        +---------+---------------+---------+-----------+----------+--------------+ PFV      Full                                                        +---------+---------------+---------+-----------+----------+--------------+ POP      Full           Yes      Yes                                 +---------+---------------+---------+-----------+----------+--------------+ PTV      Full                                                        +---------+---------------+---------+-----------+----------+--------------+ PERO     Full                                                        +---------+---------------+---------+-----------+----------+--------------+     Summary: BILATERAL: - No evidence of deep vein thrombosis seen in the lower extremities, bilaterally.  RIGHT: - No cystic structure found in the popliteal fossa.  LEFT: - No cystic structure found in the popliteal fossa.  *See table(s) above for measurements and observations. Electronically signed by Fabienne Brunsharles Fields MD on 12/20/2019 at 5:54:34 PM.    Final       Scheduled Meds: . Chlorhexidine Gluconate Cloth  6 each Topical Daily   Continuous Infusions: . sodium chloride 250 mL (12/20/19 2057)  . cefTRIAXone (ROCEPHIN)  IV    . heparin 900 Units/hr (12/21/19 0905)     LOS: 2 days       Time spent: 25 minutes   Noralee StainJennifer Ray Glacken, DO [facility] 12/21/2019, 10:24 AM   Available via Epic secure chat 7am-7pm After these hours, please refer to coverage provider listed on [email or web address]

## 2019-12-21 NOTE — Progress Notes (Signed)
ANTICOAGULATION CONSULT NOTE  Pharmacy Consult for heparin Indication: pulmonary embolus  Allergies  Allergen Reactions  . Ampicillin Diarrhea    Patient Measurements: Height: 5\' 9"  (175.3 cm) Weight: 140 lb 14.4 oz (63.9 kg) IBW/kg (Calculated) : 66.2 Heparin Dosing Weight: TBW  Vital Signs: Temp: 99.3 F (37.4 C) (03/02 0344) Temp Source: Oral (03/02 0344) BP: 105/75 (03/02 0344) Pulse Rate: 70 (03/02 0344)  Labs: Recent Labs    12/18/19 2230 12/18/19 2241 12/19/19 0155 12/19/19 0155 12/19/19 1046 12/19/19 1046 12/20/19 0325 12/20/19 1850 12/21/19 0415  HGB 10.7*   < > 10.9*   < > 10.1*   < > 9.3*  --  9.9*  HCT 34.0*   < > 35.2*   < > 31.2*  --  28.3*  --  29.9*  PLT 260   < > 279   < > 239  --  201  --  180  LABPROT 14.5  --   --   --   --   --   --   --   --   INR 1.1  --   --   --   --   --   --   --   --   HEPARINUNFRC  --   --   --   --   --   --   --  0.43 1.06*  CREATININE 1.19*   < > 0.77  --   --   --  0.79  --  0.80  CKTOTAL  --   --  370*  --   --   --   --   --   --   TROPONINIHS <2  --  14  --   --   --   --   --   --    < > = values in this interval not displayed.    Estimated Creatinine Clearance: 83 mL/min (by C-G formula based on SCr of 0.8 mg/dL).   Medical History: History reviewed. No pertinent past medical history.   Assessment: 64 YOF with small bilateral PE on CT. Pharmacy dosing heparin  Heparin level supratherapeutic this AM, no bleeding per RN.    Goal of Therapy:  Heparin level 0.3-0.7 units/ml Monitor platelets by anticoagulation protocol: Yes   Plan:  Hold x 1 hour, then decrease heparin gtt to 900 units/hr F/u 6 hour heparin level  45, PharmD Clinical Pharmacist Please check AMION for all [facility] Pharmacy numbers 12/21/2019 8:07 AM

## 2019-12-21 NOTE — Consult Note (Signed)
Physical Medicine and Rehabilitation Consult Reason for Consult: Paraplegia Referring Physician: [facility]   HPI: Anna Campos is a 54 y.o. right-handed female with unremarkable past medical history except tobacco abuse on no prescription medications.  Per chart review lives with spouse and 80 year old daughter.  Independent prior to admission.  Works in Clinical biochemist for [employer].  1 level home 3 steps to entry.  Presented 12/18/2019 with syncope, orthostasis and generalized fatigue as well as reported fall by her family as well as questionable loss of consciousness.  Admission chemistries and blood culture Streptococcus, alcohol negative, lactic acid 1.8.  Cranial CT scan negative.  Echocardiogram with ejection fraction of 65% without emboli.  CT of chest abdomen pelvis negative.  CT angiogram of the chest showed small nonocclusive pulmonary emboli bilaterally involving segmental and subsegmental size vessels.  Bilateral lower extremity Dopplers negative.  Patient was placed on heparin therapy.  MRI of the brain with no evidence of acute intracranial abnormality.  Infectious disease consulted for streptococcal bacteremia currently maintained on vancomycin with follow-up per infectious disease.  CT of cervical thoracic spine showed prominent spinal cord signal abnormality at the C4-C6 levels.  Involvement of the entire cord cross-section at the lower C5 level.  Edema signal within the interspinous spaces at the C3-T2 levels.  A C4-C5 posterior disc osteophyte complex contributes to severe spinal canal stenosis with mild spinal cord flattening.  Neurosurgery Dr. Jordan Likes consulted and currently no plan for surgical intervention until her medical situation is cleared.  Therapy evaluations completed with recommendations of physical medicine rehab consult. \ Pt reports leg cramps since daughter was born- is 53- now. Required in/out caths because is peeing but not emptying.  Hasn't had BM, but has  decreased sensaition- not sure about bowel control.  Most of pain is leg cramping/spasms.  Also c/o severe stiffness esp in LEs- feels weak in arms and legs.  Is moving better than was when came in this weekend.     Review of Systems  Constitutional: Negative for chills and fever.  HENT: Negative for hearing loss.   Eyes: Negative for blurred vision and double vision.  Respiratory: Negative for cough and shortness of breath.   Cardiovascular: Negative for chest pain, palpitations and leg swelling.  Gastrointestinal: Positive for constipation. Negative for heartburn, nausea and vomiting.  Genitourinary: Negative for dysuria, flank pain and hematuria.  Musculoskeletal: Positive for myalgias.  Skin: Negative for rash.  Neurological: Positive for dizziness and weakness.       Syncope  All other systems reviewed and are negative.  Past Medical History:  Diagnosis Date  . PE (pulmonary thromboembolism) (HCC) 12/20/2019  . Syncope 12/20/2019   Past Surgical History:  Procedure Laterality Date  . CESAREAN SECTION     Family History  Problem Relation Age of Onset  . Breast cancer Mother    Social History:  reports that she has quit smoking. She has never used smokeless tobacco. She reports current alcohol use. She reports previous drug use. Allergies:  Allergies  Allergen Reactions  . Ampicillin Diarrhea   Medications Prior to Admission  Medication Sig Dispense Refill  . Multiple Vitamin (MULTIVITAMIN WITH MINERALS) TABS tablet Take 1 tablet by mouth daily.    . naproxen sodium (ALEVE) 220 MG tablet Take 220 mg by mouth daily as needed (pain).      Home: Home Living Family/patient expects to be discharged to:: Private residence Living Arrangements: Spouse/significant other Available Help at Discharge: Family, Available 24 hours/day  Type of Home: House Home Access: Stairs to enter [employer] of Steps: 3-4 Entrance Stairs-Rails: Right Home Layout: One  level Bathroom Shower/Tub: Tub/shower unit, Engineer, building services: Standard Home Equipment: None Additional Comments: Spouse home by 3pm daily  Functional History: Prior Function Level of Independence: Independent Comments: works in Clinical biochemist for [employer], works from home, 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 Functional Status:  Mobility: Bed Mobility Overal bed mobility: Needs Assistance Bed Mobility: Rolling, Sidelying to Sit, Sit to Sidelying Rolling: Max assist, +2 for physical assistance Sidelying to sit: Max assist, +2 for physical assistance Sit to sidelying: Max assist, +2 for physical assistance General bed mobility comments: Pt attempting to assist with  BUEs; poor trunk control and ability to move BUEs and BLEs. Pt intermittent assist for sitting upright. Pt requiring increased assist for trunk elevation and BLE management. Transfers Overall transfer level: Needs assistance Equipment used: 2 person hand held assist(2 person lift with gait belt and bed pad) Transfers: Sit to/from Stand Sit to Stand: Total assist, +2 physical assistance General transfer comment: pt with poor trunk control unable to pull up with bilat hands and required bilat knee blocking, completed 3 attempts,pt unable to achieve bilat knee extension , dependent on physical assist x2 to maintain up right position Ambulation/Gait General Gait Details: unable at this time    ADL: ADL Overall ADL's : Needs assistance/impaired Eating/Feeding: Maximal assistance, Bed level Eating/Feeding Details (indicate cue type and reason): Pt with practice can perform lap to mouth pattern, but unable to grip built up spoon Grooming: Total assistance Upper Body Bathing: Total assistance Lower Body Bathing: Total assistance Upper Body Dressing : Total assistance Lower Body Dressing: Total assistance Toilet Transfer: Total assistance, +2 for physical assistance, +2 for safety/equipment Toileting- Clothing  Manipulation and Hygiene: Total assistance Functional mobility during ADLs: Total assistance General ADL Comments: Pt maxA to totalA due to weakness, decreased activity tolerance and decreased coordination.  Cognition: Cognition Overall Cognitive Status: Within Functional Limits for tasks assessed Orientation Level: Oriented X4 Cognition Arousal/Alertness: Awake/alert Behavior During Therapy: WFL for tasks assessed/performed Overall Cognitive Status: Within Functional Limits for tasks assessed General Comments: able to correct Feb 28th to March 1st.  Blood pressure 105/75, pulse 70, temperature 99.3 F (37.4 C), temperature source Oral, resp. rate 17, height 5\' 9"  (1.753 m), weight 63.9 kg, SpO2 98 %. Physical Exam  Nursing note and vitals reviewed. Constitutional: She appears well-developed and well-nourished.  Awake, alert, appropriate, a little lisp occ; husband at bedside, sitting up slightly in bed; NAD; IVs running on both sides  HENT:  Head: Normocephalic and atraumatic.  Nose: Nose normal.  Mouth/Throat: Oropharynx is clear and moist.  Poor dentition- esp front teeth; cranial nerves intact- no facial sensation changes; no facial droop; tongue midline;   Eyes: Pupils are equal, round, and reactive to light. Conjunctivae and EOM are normal. No scleral icterus.  No nystagmus B/L   Neck: No tracheal deviation present.  Cardiovascular: Normal rate and regular rhythm.  Respiratory: Effort normal and breath sounds normal. No stridor. No respiratory distress. She has no wheezes. She has no rales.  GI:  Soft, slightly distended- LBM >3 days ago; NT; no rebound;   Musculoskeletal:     Cervical back: Normal range of motion and neck supple.     Comments:  RUE_ biceps 4/5, WE 4/5, tricep 2/5, grip 1/5, finger 0/5 LUE- bicep 4/5, WE 3/5, triceps 3/5, grip 1/5, finger 0/5  RLE- HF 1/5, KE 3/5, DF  0/5, PF 2/5, EHL 2/5 LLE- HF- 1/5, KE 2/5, DF 0/5, PF 3/5, EHL 2/5  Full ROm of  joints, but is developing cramping/contractures of hands- fingers are curling already.   Neurological:  Patient is alert in no acute distress oriented x3. Sensation decreased from T2- S5 to light touch and pinprick- each dermatome was checked B/L MAS 1+ in LEs if not 2 possibly B/L at hips, knees and ankles No clonus; Hoffmans (+) on LUE; not RUE  Described decreased sensation as tingling, pins and needles.  Initially was supersensitive to touch- now improved.   Skin:  Hands slightly swollen, esp in fingers due to disuse.  IVs in B/L UEs- no infiltration   Psychiatric: She has a normal mood and affect.    Results for orders placed or performed during the hospital encounter of 12/18/19 (from the past 24 hour(s))  Glucose, capillary     Status: None   Collection Time: 12/20/19 11:41 AM  Result Value Ref Range   Glucose-Capillary 92 70 - 99 mg/dL  Culture, blood (routine x 2)     Status: None (Preliminary result)   Collection Time: 12/20/19  6:29 PM   Specimen: BLOOD RIGHT WRIST  Result Value Ref Range   Specimen Description BLOOD RIGHT WRIST    Special Requests      BOTTLES DRAWN AEROBIC AND ANAEROBIC Blood Culture adequate volume   Culture      NO GROWTH < 12 HOURS Performed at [facility], [address] [place], [place] [address]    Report Status PENDING   Culture, blood (routine x 2)     Status: None (Preliminary result)   Collection Time: 12/20/19  6:45 PM   Specimen: BLOOD RIGHT HAND  Result Value Ref Range   Specimen Description BLOOD RIGHT HAND    Special Requests      BOTTLES DRAWN AEROBIC AND ANAEROBIC Blood Culture adequate volume   Culture      NO GROWTH < 12 HOURS Performed at [facility], [address] [place], [place] [address]    Report Status PENDING   Heparin level (unfractionated)     Status: None   Collection Time: 12/20/19  6:50 PM  Result Value Ref Range   Heparin Unfractionated 0.43 0.30 - 0.70 IU/mL  Basic metabolic panel      Status: Abnormal   Collection Time: 12/21/19  4:15 AM  Result Value Ref Range   Sodium 141 135 - 145 mmol/L   Potassium 3.8 3.5 - 5.1 mmol/L   Chloride 113 (H) 98 - 111 mmol/L   CO2 21 (L) 22 - 32 mmol/L   Glucose, Bld 80 70 - 99 mg/dL   BUN 6 6 - 20 mg/dL   Creatinine, Ser 9.16 0.44 - 1.00 mg/dL   Calcium 8.2 (L) 8.9 - 10.3 mg/dL   GFR calc non Af Amer >60 >60 mL/min   GFR calc Af Amer >60 >60 mL/min   Anion gap 7 5 - 15  CBC     Status: Abnormal   Collection Time: 12/21/19  4:15 AM  Result Value Ref Range   WBC 5.9 4.0 - 10.5 K/uL   RBC 3.29 (L) 3.87 - 5.11 MIL/uL   Hemoglobin 9.9 (L) 12.0 - 15.0 g/dL   HCT 94.5 (L) 03.8 - 88.2 %   MCV 90.9 80.0 - 100.0 fL   MCH 30.1 26.0 - 34.0 pg   MCHC 33.1 30.0 - 36.0 g/dL   RDW 80.0 34.9 -  15.5 %   Platelets 180 150 - 400 K/uL   nRBC 0.0 0.0 - 0.2 %  Magnesium     Status: None   Collection Time: 12/21/19  4:15 AM  Result Value Ref Range   Magnesium 1.9 1.7 - 2.4 mg/dL  Heparin level (unfractionated)     Status: Abnormal   Collection Time: 12/21/19  4:15 AM  Result Value Ref Range   Heparin Unfractionated 1.06 (H) 0.30 - 0.70 IU/mL   CT ANGIO CHEST PE W OR WO CONTRAST  Result Date: 12/20/2019 CLINICAL DATA:  54 year old female with history of syncope. Elevated D-dimer. Chest pain. EXAM: CT ANGIOGRAPHY CHEST WITH CONTRAST TECHNIQUE: Multidetector CT imaging of the chest was performed using the standard protocol during bolus administration of intravenous contrast. Multiplanar CT image reconstructions and MIPs were obtained to evaluate the vascular anatomy. CONTRAST:  28mL OMNIPAQUE IOHEXOL 350 MG/ML SOLN COMPARISON:  Chest CT 12/18/2019. FINDINGS: Cardiovascular: There are small filling defects within the pulmonary arteries bilaterally involving segmental sized branches in the left lower lobe and subsegmental sized branches in the right lower lobe. These are nonocclusive. No larger central or lobar sized filling defects are noted. Heart size  is normal. There is no significant pericardial fluid, thickening or pericardial calcification. There is aortic atherosclerosis, as well as atherosclerosis of the great vessels of the mediastinum and the coronary arteries, including calcified atherosclerotic plaque in the left anterior descending coronary artery. Mediastinum/Nodes: No pathologically enlarged mediastinal or hilar lymph nodes. Esophagus is unremarkable in appearance. No axillary lymphadenopathy. Lungs/Pleura: Dependent areas of subsegmental atelectasis are noted in lower lobes of the lungs bilaterally. No acute consolidative airspace disease. No pleural effusions. Upper Abdomen: Intermediate attenuation material lying dependently in the gallbladder, presumably biliary sludge. Musculoskeletal: There are no aggressive appearing lytic or blastic lesions noted in the visualized portions of the skeleton. Review of the MIP images confirms the above findings. IMPRESSION: 1. Small nonocclusive pulmonary emboli bilaterally involving segmental and subsegmental sized vessels, as above. 2. Aortic atherosclerosis, in addition to left anterior descending coronary artery disease. Please note that although the presence of coronary artery calcium documents the presence of coronary artery disease, the severity of this disease and any potential stenosis cannot be assessed on this non-gated CT examination. Assessment for potential risk factor modification, dietary therapy or pharmacologic therapy may be warranted, if clinically indicated. 3. Biliary sludge in the gallbladder. These results will be called to the ordering clinician or representative by the Radiologist Assistant, and communication documented in the PACS or zVision Dashboard. Aortic Atherosclerosis (ICD10-I70.0). Electronically Signed   By: Trudie Reed M.D.   On: 12/20/2019 10:53   MR BRAIN WO CONTRAST  Result Date: 12/20/2019 CLINICAL DATA:  Neuro deficit, subacute. Additional history provided:  Syncope, generalized fatigue, poor p.o. intake. EXAM: MRI HEAD WITHOUT CONTRAST TECHNIQUE: Multiplanar, multiecho pulse sequences of the brain and surrounding structures were obtained without intravenous contrast. COMPARISON:  Head CT 12/18/2019. FINDINGS: Brain: The examination is intermittently motion degraded. Most notably there is mild-to-moderate motion degradation of the axial T2 FLAIR sequence and moderate/severe motion degradation of the coronal T2 weighted sequence. There is no evidence of acute infarct. No evidence of intracranial mass. No midline shift or extra-axial fluid collection. No chronic intracranial blood products. Mild scattered T2/FLAIR hyperintensity within the cerebral white matter is nonspecific, but most commonly seen on the basis of chronic small vessel ischemic disease. Mild generalized parenchymal atrophy. Incidentally noted cavum septum pellucidum and cavum vergae. Vascular: Visualized orbits demonstrate no acute  abnormality. Skull and upper cervical spine: No focal marrow lesion. Sinuses/Orbits: Visualized orbits demonstrate no acute abnormality. Mild scattered paranasal sinus mucosal thickening greatest within bilateral ethmoid air cells. No significant mastoid effusion. IMPRESSION: 1. Intermittently motion degraded exam. 2. No evidence of acute intracranial abnormality. 3. Mild scattered T2 hyperintense signal changes within the cerebral white matter are nonspecific, but most commonly seen on the basis of chronic small vessel ischemic disease. 4. Mild generalized parenchymal atrophy. 5. Mild paranasal sinus mucosal thickening. Electronically Signed   By: Jackey Loge DO   On: 12/20/2019 16:57   MR CERVICAL SPINE WO CONTRAST  Result Date: 12/20/2019 CLINICAL DATA:  Spinal stenosis, cervical spine. EXAM: MRI CERVICAL SPINE WITHOUT CONTRAST TECHNIQUE: Multiplanar, multisequence MR imaging of the cervical spine was performed. No intravenous contrast was administered. COMPARISON:  CT  cervical spine 12/18/2019. FINDINGS: Mildly motion degraded examination Alignment: Mild reversal of the expected cervical lordosis. No significant spondylolisthesis. Vertebrae: Vertebral body height is maintained. No significant marrow edema or suspicious osseous lesion. Multilevel degenerative endplate irregularity and mixed degenerative endplate marrow signal. This includes mild degenerative endplate edema at R1-V4. Cord: There is prominent T2/STIR hyperintense cord signal abnormality at the C4 through C6 levels. The cord signal abnormality involves the entire cord at the lower C5 level. Posterior Fossa, vertebral arteries, paraspinal tissues: No abnormality identified within included portions of the posterior fossa. Flow voids preserved within the cervical vertebral arteries. There is STIR hyperintense signal within the interspinous spaces at the C3-C4 through T1-T2 levels. Disc levels: Moderate/severe C3-C4 disc degeneration. Moderate disc degeneration at the remaining cervical levels. C2-C3: No disc herniation. No significant canal or foraminal stenosis. C3-C4: Posterior disc osteophyte complex. Uncinate/facet hypertrophy. Mild spinal canal stenosis. There is contact upon the ventral spinal cord with minimal flattening of the ventral cord. Bilateral neural foraminal narrowing (moderate right, moderate/severe left). C4-C5: Posterior disc osteophyte complex. Uncinate/facet hypertrophy. Severe spinal canal stenosis with mild spinal cord flattening. Bilateral neural foraminal narrowing (moderate right, severe left). C5-C6: Posterior disc osteophyte complex. Uncinate/facet hypertrophy. Mild spinal canal stenosis. Severe bilateral neural foraminal narrowing. C6-C7: Posterior disc osteophyte complex. Uncinate/facet hypertrophy. Mild spinal canal stenosis. Severe bilateral neural foraminal narrowing. C7-T1: No disc herniation. No significant canal or foraminal stenosis. IMPRESSION: Prominent spinal cord signal  abnormality at the C4 through C6 levels. There is involvement of the entire cord cross-section at the lower C5 level. Differential considerations include spinal cord infarct, contusion, demyelinating disease or other inflammatory/infectious process. Edema signal within the interspinous spaces at the C3-T2 levels, which may reflect interspinous ligament injury. Cervical spondylosis as described and most notably as follows. At C4-C5, a posterior disc osteophyte complex contributes to severe spinal canal stenosis with mild spinal cord flattening. Bilateral neural foraminal narrowing (moderate right, severe left) No more than mild spinal canal stenosis at the remaining levels. Additional sites of neural foraminal narrowing, including site of severe and moderate/severe neural foraminal narrowing as detailed. Electronically Signed   By: Jackey Loge DO   On: 12/20/2019 17:32   MR THORACIC SPINE WO CONTRAST  Result Date: 12/20/2019 CLINICAL DATA:  Distal greater than proximal weakness, trauma/fall at home prior to admission. EXAM: MRI THORACIC SPINE WITHOUT CONTRAST TECHNIQUE: Multiplanar, multisequence MR imaging of the thoracic spine was performed. No intravenous contrast was administered. COMPARISON:  CT chest 12/18/2019 FINDINGS: Mildly motion degraded examination. Alignment:  Alignment is maintained. Vertebrae: Vertebral body height is maintained. No significant marrow edema or suspicious osseous lesion. Cord:  No spinal cord signal abnormality is  identified. Paraspinal and other soft tissues: Mild nonspecific edema within the dorsal subcutaneous soft tissues overlying the lower thoracic and visualized upper lumbar spine. No abnormality identified within included portions of the thorax or upper abdomen. Disc levels: Mild disc degeneration throughout the thoracic spine. Mild multilevel disc bulges and facet arthrosis/ligamentum flavum hypertrophy. No significant spinal canal stenosis at any level. No compressive  foraminal stenosis. IMPRESSION: Mildly motion degraded examination. No spinal cord signal abnormality is identified. Mild thoracic spondylosis with no significant spinal canal stenosis, and no compressive foraminal narrowing. Electronically Signed   By: Kellie Simmering DO   On: 12/20/2019 17:59   ECHOCARDIOGRAM COMPLETE  Result Date: 12/19/2019    ECHOCARDIOGRAM REPORT   Patient Name:   CLORISSA GRUENBERG Date of Exam: 12/19/2019 Medical Rec #:  [id number]   Height:       67.0 in Accession #:    [id number]  Weight:       131.4 lb Date of Birth:  08-Dec-1965  BSA:          1.692 m Patient Age:    34 years    BP:           90/50 mmHg Patient Gender: F           HR:           77 bpm. Exam Location:  Inpatient Procedure: 2D Echo Indications:    syncope 780.2  History:        Patient has no prior history of Echocardiogram examinations.  Sonographer:    Johny Chess Referring Phys: [id number] JENNIFER T KRALL IMPRESSIONS  1. Left ventricular ejection fraction, by estimation, is 60 to 65%. The left ventricle has normal function. The left ventricle has no regional wall motion abnormalities. Left ventricular diastolic parameters were normal.  2. Right ventricular systolic function is normal. The right ventricular size is normal. Tricuspid regurgitation signal is inadequate for assessing PA pressure.  3. The mitral valve is normal in structure and function. No evidence of mitral valve regurgitation. No evidence of mitral stenosis.  4. The aortic valve is tricuspid. Aortic valve regurgitation is not visualized. Mild aortic valve sclerosis is present, with no evidence of aortic valve stenosis.  5. The inferior vena cava is dilated in size with >50% respiratory variability, suggesting right atrial pressure of 8 mmHg. FINDINGS  Left Ventricle: Left ventricular ejection fraction, by estimation, is 60 to 65%. The left ventricle has normal function. The left ventricle has no regional wall motion abnormalities. The left ventricular  internal cavity size was normal in size. There is  no left ventricular hypertrophy. Left ventricular diastolic parameters were normal. Normal left ventricular filling pressure. Right Ventricle: The right ventricular size is normal. No increase in right ventricular wall thickness. Right ventricular systolic function is normal. Tricuspid regurgitation signal is inadequate for assessing PA pressure. Left Atrium: Left atrial size was normal in size. Right Atrium: Right atrial size was normal in size. Pericardium: There is no evidence of pericardial effusion. Mitral Valve: The mitral valve is normal in structure and function. Normal mobility of the mitral valve leaflets. No evidence of mitral valve regurgitation. No evidence of mitral valve stenosis. Tricuspid Valve: The tricuspid valve is normal in structure. Tricuspid valve regurgitation is mild . No evidence of tricuspid stenosis. Aortic Valve: The aortic valve is tricuspid. Aortic valve regurgitation is not visualized. Mild aortic valve sclerosis is present, with no evidence of aortic valve stenosis. Pulmonic Valve: The pulmonic valve was normal in structure. Pulmonic  valve regurgitation is not visualized. No evidence of pulmonic stenosis. Aorta: The aortic root is normal in size and structure. Venous: The inferior vena cava is dilated in size with greater than 50% respiratory variability, suggesting right atrial pressure of 8 mmHg. IAS/Shunts: No atrial level shunt detected by color flow Doppler.  LEFT VENTRICLE PLAX 2D LVIDd:         4.30 cm  Diastology LVIDs:         2.80 cm  LV e' lateral:   12.10 cm/s LV PW:         0.90 cm  LV E/e' lateral: 6.6 LV IVS:        0.90 cm  LV e' medial:    11.30 cm/s LVOT diam:     1.80 cm  LV E/e' medial:  7.0 LV SV:         73 LV SV Index:   43 LVOT Area:     2.54 cm  RIGHT VENTRICLE RV S prime:     15.60 cm/s TAPSE (M-mode): 2.2 cm LEFT ATRIUM             Index       RIGHT ATRIUM          Index LA diam:        2.80 cm 1.66 cm/m   RA Area:     8.61 cm LA Vol (A2C):   27.4 ml 16.20 ml/m RA Volume:   16.80 ml 9.93 ml/m LA Vol (A4C):   27.9 ml 16.49 ml/m LA Biplane Vol: 27.7 ml 16.38 ml/m  AORTIC VALVE LVOT Vmax:   144.00 cm/s LVOT Vmean:  93.300 cm/s LVOT VTI:    0.285 m  AORTA Ao Root diam: 3.20 cm MITRAL VALVE MV Area (PHT): 3.42 cm    SHUNTS MV Decel Time: 222 msec    Systemic VTI:  0.29 m MV E velocity: 79.30 cm/s  Systemic Diam: 1.80 cm MV A velocity: 89.10 cm/s MV E/A ratio:  0.89 Armanda Magicraci Turner MD Electronically signed by Armanda Magicraci Turner MD Signature Date/Time: 12/19/2019/10:44:12 AM    Final    VAS US LOWER EXTREMITY VENOUS (DVT)  Result Date: 12/20/2019  Lower Venous DVTStudy Indications: Pulmonary embolism.  Anticoagulation: Heparin. Comparison Study: No prior exam. Performing Technologist: Kennedy BuckerKristy Siebrecht ARDMS, RVT  Examination Guidelines: A complete evaluation includes B-mode imaging, spectral Doppler, color Doppler, and power Doppler as needed of all accessible portions of each vessel. Bilateral testing is considered an integral part of a complete examination. Limited examinations for reoccurring indications may be performed as noted. The reflux portion of the exam is performed with the patient in reverse Trendelenburg.  +---------+---------------+---------+-----------+----------+--------------+ RIGHT    CompressibilityPhasicitySpontaneityPropertiesThrombus Aging +---------+---------------+---------+-----------+----------+--------------+ CFV      Full           Yes      Yes                                 +---------+---------------+---------+-----------+----------+--------------+ SFJ      Full                                                        +---------+---------------+---------+-----------+----------+--------------+ FV Prox  Full                                                        +---------+---------------+---------+-----------+----------+--------------+  FV Mid   Full                                                         +---------+---------------+---------+-----------+----------+--------------+ FV DistalFull                                                        +---------+---------------+---------+-----------+----------+--------------+ PFV      Full                                                        +---------+---------------+---------+-----------+----------+--------------+ POP      Full           Yes      Yes                                 +---------+---------------+---------+-----------+----------+--------------+ PTV      Full                                                        +---------+---------------+---------+-----------+----------+--------------+ PERO     Full                                                        +---------+---------------+---------+-----------+----------+--------------+   +---------+---------------+---------+-----------+----------+--------------+ LEFT     CompressibilityPhasicitySpontaneityPropertiesThrombus Aging +---------+---------------+---------+-----------+----------+--------------+ CFV      Full           Yes      Yes                                 +---------+---------------+---------+-----------+----------+--------------+ SFJ      Full                                                        +---------+---------------+---------+-----------+----------+--------------+ FV Prox  Full                                                        +---------+---------------+---------+-----------+----------+--------------+ FV Mid   Full                                                        +---------+---------------+---------+-----------+----------+--------------+  FV DistalFull                                                        +---------+---------------+---------+-----------+----------+--------------+ PFV      Full                                                         +---------+---------------+---------+-----------+----------+--------------+ POP      Full           Yes      Yes                                 +---------+---------------+---------+-----------+----------+--------------+ PTV      Full                                                        +---------+---------------+---------+-----------+----------+--------------+ PERO     Full                                                        +---------+---------------+---------+-----------+----------+--------------+     Summary: BILATERAL: - No evidence of deep vein thrombosis seen in the lower extremities, bilaterally.  RIGHT: - No cystic structure found in the popliteal fossa.  LEFT: - No cystic structure found in the popliteal fossa.  *See table(s) above for measurements and observations. Electronically signed by Fabienne Brunsharles Fields MD on 12/20/2019 at 5:54:34 PM.    Final      Assessment/Plan: Diagnosis: C5 quadriplegia- incomplete- ASIA C.  1. Does the need for close, 24 hr/day medical supervision in concert with the patient's rehab needs make it unreasonable for this patient to be served in a less intensive setting? Yes 2. Co-Morbidities requiring supervision/potential complications: SCI, neurogenic bowel; neurogenic bladder, Pes, bacteremia 3. Due to bladder management, bowel management, safety, skin/wound care, disease management, medication administration, pain management and patient education, does the patient require 24 hr/day rehab nursing? Yes 4. Does the patient require coordinated care of a physician, rehab nurse, therapy disciplines of PT and OT to address physical and functional deficits in the context of the above medical diagnosis(es)? Yes Addressing deficits in the following areas: balance, endurance, strength, transferring, bowel/bladder control, bathing, dressing, feeding, grooming, toileting and psychosocial support 5. Can the patient actively participate in an intensive therapy  program of at least 3 hrs of therapy per day at least 5 days per week? Yes 6. The potential for patient to make measurable gains while on inpatient rehab is good 7. Anticipated functional outcomes upon discharge from inpatient rehab are supervision and min assist  with PT, supervision and min assist with OT, n/a with SLP. 8. Estimated rehab length of stay to reach the above functional goals is: 4 weeks 9. Anticipated discharge destination: Home 10. Overall Rehab/Functional Prognosis: good  RECOMMENDATIONS: This patient's condition is appropriate for continued rehabilitative care in the following setting: CIR Patient has agreed to participate in recommended program. Potentially Note that insurance prior authorization may be required for reimbursement for recommended care.  Comment:  Pt has C5 incomplete Quadriplegia based on ASIA exam done today.   1. Suggest bowel program- at least start with 2 senokot in AM daily and then follow with suppository every evening after dinner- if doesn't start to clean her out, use Mg citrate ot clean out.   2. Suggest Baclofen 5 mg TID for spasticity- flexeril and Robaxin don't help spasticity, and that's why she feels so stiff- spasticity occurs in 70% of SCI patients- however so soon, means either she already had it, or it bodes ill for increasing spasticity formation. Not clear- might have had before fall based on pt history.   3. Strongly suggest starting Flomax 0.4 mg qsupper to try and get her emptying her bladder- has neurogenic bladder, but not emptying, although voiding some.   4. OT suggested getting night rest wrist splints- we need to order to prevent contracture formation.  5. Also, please order Prevalon boots (the puffy boots) from [employer] just type [employer] into orders and will shoe options- can type in other if need be.   6. Pt at high risk of forming DVT, but already has PE- on heparin gtt- will need to follow closely since SCI patients can form  DVTs even on meds.    7. Pt at higher risk for skin breakdown- please float heels until boots come and turn every few hours while in bed- suggest she sleep on her side, if possible.   7. Once pt medically stable, it appropriate to get pt into CIR_ will start process, will con't to follow up on these issues once comes to rehab. Please make sure pt has BM before coming to rehab, so doesn't miss therapy for cleaning her out. Thank you so much!  8. Of note, spent 1 hour directly with pt and husband answering quesitons- they seemed to think pt's weakness would "automatically" get better- explained it could, since it's already better, however it could take up to 1+ years to get her maximal return and that doesn't mean 100% return. Also explained how bowel and bladder also are affected by SCI- she said it was first time she heard the term, SCI patient.     If additional questions, call Dr Berline Chough by Loretha Stapler.  Mcarthur Rossetti Angiulli, PA-C 12/21/2019   I spent a total of 90 minutes on consult- 1 hour with patient, 15 minutes reviewing chart and 15 minutes typing consult.

## 2019-12-22 DIAGNOSIS — K089 Disorder of teeth and supporting structures, unspecified: Secondary | ICD-10-CM

## 2019-12-22 DIAGNOSIS — M4800 Spinal stenosis, site unspecified: Secondary | ICD-10-CM

## 2019-12-22 DIAGNOSIS — M4802 Spinal stenosis, cervical region: Secondary | ICD-10-CM

## 2019-12-22 LAB — CBC
HCT: 29.6 % — ABNORMAL LOW (ref 36.0–46.0)
Hemoglobin: 9.6 g/dL — ABNORMAL LOW (ref 12.0–15.0)
MCH: 29.5 pg (ref 26.0–34.0)
MCHC: 32.4 g/dL (ref 30.0–36.0)
MCV: 91.1 fL (ref 80.0–100.0)
Platelets: 181 10*3/uL (ref 150–400)
RBC: 3.25 MIL/uL — ABNORMAL LOW (ref 3.87–5.11)
RDW: 12.3 % (ref 11.5–15.5)
WBC: 6.5 10*3/uL (ref 4.0–10.5)
nRBC: 0 % (ref 0.0–0.2)

## 2019-12-22 LAB — HEPARIN LEVEL (UNFRACTIONATED): Heparin Unfractionated: 0.5 IU/mL (ref 0.30–0.70)

## 2019-12-22 LAB — CULTURE, BLOOD (ROUTINE X 2)

## 2019-12-22 LAB — HEPATITIS C ANTIBODY: HCV Ab: NONREACTIVE

## 2019-12-22 NOTE — Progress Notes (Signed)
PROGRESS NOTE    Anna Campos  ZTI:[id number] DOB: [birth year] DOA: 12/18/2019 PCP: Farris Has, MD     Brief Narrative:  Anna Campos is a 54 year old female with no significant past medical history presenting with syncope.  She has been having generalized fatigue.  She has had poor p.o. intake and has not eaten in the last day.  She stood up this evening and passed out.  Family heard her fall.  She did hit her head.  She was told she was not unconscious for a long period of time.  She was noted by EMS to be hypotensive and she was given a small fluid bolus.  She was admitted to ICU due to syncope, hypovolemic shock.  Due to EKG changes, cardiology was consulted.  Patient was weaned off Levophed and transferred [facility] on 3/1.  New events last 24 hours / Subjective: Feels like her strength has slightly improved, but still remains weak overall.   Assessment & Plan:   Active Problems:   Syncope   Quadriplegia (HCC)   Hypotension   Bacteremia   Hypovolemic shock and syncope -Admitted to ICU and now weaned off of Levophed.  Cortisol 23.5 -Appreciate cardiology; EKG shows nonspecific findings and pretest probability for CAD is low.  Troponin negative.  Echocardiogram with normal LVEF, normal RV size and function without valvular abnormalities.  No sign of right heart strain. No further cardiac work-up recommended -BP stable this morning  Strep constellatus bacteremia -ID following  -Continue rocephin   -Repeat blood cultures 3/1 pending  -Patient unable to sit up straight for orthopantogram. CT maxillofacial completed on 2/27 without mention of any dental caries or abscess. ID recommended consideration of dental evaluation given her strep bacteremia with possible odontic source. Called Dr. Luretha Murphy office for consultation today   PE -DVT US negative -IV heparin   Bilateral extremity weakness with incomplete spinal cord injury C3-C6 -Distal lower extremity  weaker compared to proximal -Consulted neurosurgery. Patient will eventually need multilevel cervical decompressive surgery once medically stabliized -PT OT, recommend CIR     DVT prophylaxis: IV heparin Code Status: Full code Family Communication: None at bedside  Disposition Plan:   Patient is from home prior to admission.  Currently in-hospital treatment needed due to further work-up and plan for bacteremia. Will need to transition to DOAC prior to discharge   Suspect patient will discharge CIR    Consultants:   PCCM admission  Cardiology  ID   Neurosurgery   Dental   Procedures:   None  Antimicrobials:  Anti-infectives (From admission, onward)   Start     Dose/Rate Route Frequency Ordered Stop   12/21/19 1000  cefTRIAXone (ROCEPHIN) 2 g in sodium chloride 0.9 % 100 mL IVPB     2 g 200 mL/hr over 30 Minutes Intravenous Every 24 hours 12/21/19 0948     12/20/19 2130  vancomycin (VANCOREADY) IVPB 750 mg/150 mL  Status:  Discontinued     750 mg 150 mL/hr over 60 Minutes Intravenous Every 12 hours 12/20/19 1825 12/21/19 0948   12/20/19 1530  ceFAZolin (ANCEF) IVPB 2g/100 mL premix  Status:  Discontinued     2 g 200 mL/hr over 30 Minutes Intravenous Every 8 hours 12/20/19 1526 12/20/19 1814   12/20/19 1000  vancomycin (VANCOREADY) IVPB 750 mg/150 mL  Status:  Discontinued     750 mg 150 mL/hr over 60 Minutes Intravenous Every 12 hours 12/20/19 0054 12/20/19 1019   12/20/19 0130  vancomycin (VANCOREADY) IVPB  1250 mg/250 mL     1,250 mg 166.7 mL/hr over 90 Minutes Intravenous  Once 12/20/19 0054 12/20/19 0356       Objective: Vitals:   12/21/19 1650 12/21/19 2007 12/22/19 0100 12/22/19 0404  BP: 123/76 111/72  117/65  Pulse: (!) 59 62  (!) 57  Resp: 15 16  15   Temp: 98.6 F (37 C) 98.4 F (36.9 C)  98.5 F (36.9 C)  TempSrc: Oral Oral  Oral  SpO2:  99%  100%  Weight:   62 kg   Height:        Intake/Output Summary (Last 24 hours) at 12/22/2019  1147 Last data filed at 12/22/2019 0700 Gross per 24 hour  Intake 605.79 ml  Output 2250 ml  Net -1644.21 ml   Filed Weights   12/20/19 0450 12/21/19 0038 12/22/19 0100  Weight: 63.7 kg 63.9 kg 62 kg    Examination: General exam: Appears calm and comfortable  Respiratory system: Clear to auscultation. Respiratory effort normal. Cardiovascular system: S1 & S2 heard, RRR. No pedal edema. Gastrointestinal system: Abdomen is nondistended, soft and nontender. Normal bowel sounds heard. Central nervous system: Alert and oriented. Continues to have weakness in all 4 extremities, distal worse compared to proximal  Extremities: Symmetric in appearance bilaterally  Skin: No rashes, lesions or ulcers on exposed skin  Psychiatry: Judgement and insight appear stable. Mood & affect appropriate.    Data Reviewed: I have personally reviewed following labs and imaging studies  CBC: Recent Labs  Lab 12/19/19 0155 12/19/19 1046 12/20/19 0325 12/21/19 0415 12/22/19 0612  WBC 10.8* 15.2* 8.4 5.9 6.5  NEUTROABS  --  13.7*  --   --   --   HGB 10.9* 10.1* 9.3* 9.9* 9.6*  HCT 35.2* 31.2* 28.3* 29.9* 29.6*  MCV 97.0 92.6 92.5 90.9 91.1  PLT 279 239 201 180 181   Basic Metabolic Panel: Recent Labs  Lab 12/18/19 2230 12/18/19 2241 12/19/19 0155 12/20/19 0325 12/21/19 0415  NA 139 141 140 140 141  K 3.3* 3.3* 3.7 3.7 3.8  CL 111 106 114* 115* 113*  CO2 22  --  19* 21* 21*  GLUCOSE 130* 124* 124* 91 80  BUN 7 6 5* <5* 6  CREATININE 1.19* 1.10* 0.77 0.79 0.80  CALCIUM 8.2*  --  7.8* 8.0* 8.2*  MG 1.6*  --  1.4* 2.3 1.9  PHOS  --   --  1.9*  --   --    GFR: Estimated Creatinine Clearance: 80.5 mL/min (by C-G formula based on SCr of 0.8 mg/dL). Liver Function Tests: Recent Labs  Lab 12/18/19 2230  AST 19  ALT 11  ALKPHOS 130*  BILITOT 0.6  PROT 5.6*  ALBUMIN 3.1*   Recent Labs  Lab 12/19/19 0155  LIPASE 25   No results for input(s): AMMONIA in the last 168  hours. Coagulation Profile: Recent Labs  Lab 12/18/19 2230  INR 1.1   Cardiac Enzymes: Recent Labs  Lab 12/19/19 0155  CKTOTAL 370*   BNP (last 3 results) No results for input(s): PROBNP in the last 8760 hours. HbA1C: No results for input(s): HGBA1C in the last 72 hours. CBG: Recent Labs  Lab 12/19/19 0617 12/20/19 1141  GLUCAP 113* 92   Lipid Profile: No results for input(s): CHOL, HDL, LDLCALC, TRIG, CHOLHDL, LDLDIRECT in the last 72 hours. Thyroid Function Tests: No results for input(s): TSH, T4TOTAL, FREET4, T3FREE, THYROIDAB in the last 72 hours. Anemia Panel: No results for input(s): VITAMINB12, FOLATE, FERRITIN,  TIBC, IRON, RETICCTPCT in the last 72 hours. Sepsis Labs: Recent Labs  Lab 12/18/19 2230  LATICACIDVEN 1.8    Recent Results (from the past 240 hour(s))  Blood culture (routine x 2)     Status: Abnormal   Collection Time: 12/18/19 10:30 PM   Specimen: BLOOD LEFT ARM  Result Value Ref Range Status   Specimen Description BLOOD LEFT ARM  Final   Special Requests   Final    BOTTLES DRAWN AEROBIC AND ANAEROBIC Blood Culture results may not be optimal due to an excessive volume of blood received in culture bottles   Culture  Setup Time   Final    GRAM POSITIVE COCCI IN CHAINS IN BOTH AEROBIC AND ANAEROBIC BOTTLES CRITICAL RESULT CALLED TO, READ BACK BY AND VERIFIED WITH: K. HURTH,PHARMD 2221 12/19/2019 Mena Goes Performed at [facility], [address], , [place] [id number]    Culture STREPTOCOCCUS CONSTELLATUS (A)  Final   Report Status 12/22/2019 FINAL  Final   Organism ID, Bacteria STREPTOCOCCUS CONSTELLATUS  Final      Susceptibility   Streptococcus constellatus - MIC*    PENICILLIN 0.25 INTERMEDIATE Intermediate     CEFTRIAXONE 1 SENSITIVE Sensitive     ERYTHROMYCIN <=0.12 SENSITIVE Sensitive     LEVOFLOXACIN 0.5 SENSITIVE Sensitive     VANCOMYCIN 0.5 SENSITIVE Sensitive     * STREPTOCOCCUS CONSTELLATUS  Respiratory Panel by RT  PCR (Flu A&B, Covid) - Nasopharyngeal Swab     Status: None   Collection Time: 12/18/19 11:05 PM   Specimen: Nasopharyngeal Swab  Result Value Ref Range Status   SARS Coronavirus 2 by RT PCR NEGATIVE NEGATIVE Final    Comment: (NOTE) SARS-CoV-2 target nucleic acids are NOT DETECTED. The SARS-CoV-2 RNA is generally detectable in upper respiratoy specimens during the acute phase of infection. The lowest concentration of SARS-CoV-2 viral copies this assay can detect is 131 copies/mL. A negative result does not preclude SARS-Cov-2 infection and should not be used as the sole basis for treatment or other patient management decisions. A negative result may occur with  improper specimen collection/handling, submission of specimen other than nasopharyngeal swab, presence of viral mutation(s) within the areas targeted by this assay, and inadequate number of viral copies (<131 copies/mL). A negative result must be combined with clinical observations, patient history, and epidemiological information. The expected result is Negative. Fact Sheet for Patients:  PinkCheek.be Fact Sheet for Healthcare Providers:  GravelBags.it This test is not yet ap proved or cleared by the [place] FDA and  has been authorized for detection and/or diagnosis of SARS-CoV-2 by FDA under an Emergency Use Authorization (EUA). This EUA will remain  in effect (meaning this test can be used) for the duration of the COVID-19 declaration under Section 564(b)(1) of the Act, 21 U.S.C. section 360bbb-3(b)(1), unless the authorization is terminated or revoked sooner.    Influenza A by PCR NEGATIVE NEGATIVE Final   Influenza B by PCR NEGATIVE NEGATIVE Final    Comment: (NOTE) The Xpert Xpress SARS-CoV-2/FLU/RSV assay is intended as an aid in  the diagnosis of influenza from Nasopharyngeal swab specimens and  should not be used as a sole basis for treatment. Nasal  washings and  aspirates are unacceptable for Xpert Xpress SARS-CoV-2/FLU/RSV  testing. Fact Sheet for Patients: PinkCheek.be Fact Sheet for Healthcare Providers: GravelBags.it This test is not yet approved or cleared by the [place] FDA and  has been authorized for detection and/or diagnosis of SARS-CoV-2 by  FDA under  an Emergency Use Authorization (EUA). This EUA will remain  in effect (meaning this test can be used) for the duration of the  Covid-19 declaration under Section 564(b)(1) of the Act, 21  U.S.C. section 360bbb-3(b)(1), unless the authorization is  terminated or revoked. Performed at [facility], [address], [place], [place] [address]   Blood culture (routine x 2)     Status: None (Preliminary result)   Collection Time: 12/19/19  3:31 AM   Specimen: BLOOD RIGHT WRIST  Result Value Ref Range Status   Specimen Description BLOOD RIGHT WRIST  Final   Special Requests   Final    BOTTLES DRAWN AEROBIC AND ANAEROBIC Blood Culture adequate volume   Culture   Final    NO GROWTH 2 DAYS Performed at [facility], [address] [place], [place] [address]    Report Status PENDING  Incomplete  MRSA PCR Screening     Status: None   Collection Time: 12/19/19  6:16 AM   Specimen: Nasal Mucosa; Nasopharyngeal  Result Value Ref Range Status   MRSA by PCR NEGATIVE NEGATIVE Final    Comment:        The GeneXpert MRSA Assay (FDA approved for NASAL specimens only), is one component of a comprehensive MRSA colonization surveillance program. It is not intended to diagnose MRSA infection nor to guide or monitor treatment for MRSA infections. Performed at [facility], [address], [place], [place] [address]   Culture, blood (routine x 2)     Status: None (Preliminary result)   Collection Time: 12/20/19  6:29 PM   Specimen: BLOOD RIGHT WRIST  Result Value Ref Range Status   Specimen  Description BLOOD RIGHT WRIST  Final   Special Requests   Final    BOTTLES DRAWN AEROBIC AND ANAEROBIC Blood Culture adequate volume   Culture NO GROWTH < 24 HOURS  Final   Report Status PENDING  Incomplete  Culture, blood (routine x 2)     Status: None (Preliminary result)   Collection Time: 12/20/19  6:45 PM   Specimen: BLOOD RIGHT HAND  Result Value Ref Range Status   Specimen Description BLOOD RIGHT HAND  Final   Special Requests   Final    BOTTLES DRAWN AEROBIC AND ANAEROBIC Blood Culture adequate volume   Culture NO GROWTH < 24 HOURS  Final   Report Status PENDING  Incomplete      Radiology Studies: MR BRAIN WO CONTRAST  Result Date: 12/20/2019 CLINICAL DATA:  Neuro deficit, subacute. Additional history provided: Syncope, generalized fatigue, poor p.o. intake. EXAM: MRI HEAD WITHOUT CONTRAST TECHNIQUE: Multiplanar, multiecho pulse sequences of the brain and surrounding structures were obtained without intravenous contrast. COMPARISON:  Head CT 12/18/2019. FINDINGS: Brain: The examination is intermittently motion degraded. Most notably there is mild-to-moderate motion degradation of the axial T2 FLAIR sequence and moderate/severe motion degradation of the coronal T2 weighted sequence. There is no evidence of acute infarct. No evidence of intracranial mass. No midline shift or extra-axial fluid collection. No chronic intracranial blood products. Mild scattered T2/FLAIR hyperintensity within the cerebral white matter is nonspecific, but most commonly seen on the basis of chronic small vessel ischemic disease. Mild generalized parenchymal atrophy. Incidentally noted cavum septum pellucidum and cavum vergae. Vascular: Visualized orbits demonstrate no acute abnormality. Skull and upper cervical spine: No focal marrow lesion. Sinuses/Orbits: Visualized orbits demonstrate no acute abnormality. Mild scattered paranasal sinus mucosal thickening greatest within bilateral ethmoid air cells. No  significant mastoid  effusion. IMPRESSION: 1. Intermittently motion degraded exam. 2. No evidence of acute intracranial abnormality. 3. Mild scattered T2 hyperintense signal changes within the cerebral white matter are nonspecific, but most commonly seen on the basis of chronic small vessel ischemic disease. 4. Mild generalized parenchymal atrophy. 5. Mild paranasal sinus mucosal thickening. Electronically Signed   By: Jackey LogeKyle  Golden DO   On: 12/20/2019 16:57   MR CERVICAL SPINE WO CONTRAST  Result Date: 12/20/2019 CLINICAL DATA:  Spinal stenosis, cervical spine. EXAM: MRI CERVICAL SPINE WITHOUT CONTRAST TECHNIQUE: Multiplanar, multisequence MR imaging of the cervical spine was performed. No intravenous contrast was administered. COMPARISON:  CT cervical spine 12/18/2019. FINDINGS: Mildly motion degraded examination Alignment: Mild reversal of the expected cervical lordosis. No significant spondylolisthesis. Vertebrae: Vertebral body height is maintained. No significant marrow edema or suspicious osseous lesion. Multilevel degenerative endplate irregularity and mixed degenerative endplate marrow signal. This includes mild degenerative endplate edema at O1-H0C4-C5. Cord: There is prominent T2/STIR hyperintense cord signal abnormality at the C4 through C6 levels. The cord signal abnormality involves the entire cord at the lower C5 level. Posterior Fossa, vertebral arteries, paraspinal tissues: No abnormality identified within included portions of the posterior fossa. Flow voids preserved within the cervical vertebral arteries. There is STIR hyperintense signal within the interspinous spaces at the C3-C4 through T1-T2 levels. Disc levels: Moderate/severe C3-C4 disc degeneration. Moderate disc degeneration at the remaining cervical levels. C2-C3: No disc herniation. No significant canal or foraminal stenosis. C3-C4: Posterior disc osteophyte complex. Uncinate/facet hypertrophy. Mild spinal canal stenosis. There is contact  upon the ventral spinal cord with minimal flattening of the ventral cord. Bilateral neural foraminal narrowing (moderate right, moderate/severe left). C4-C5: Posterior disc osteophyte complex. Uncinate/facet hypertrophy. Severe spinal canal stenosis with mild spinal cord flattening. Bilateral neural foraminal narrowing (moderate right, severe left). C5-C6: Posterior disc osteophyte complex. Uncinate/facet hypertrophy. Mild spinal canal stenosis. Severe bilateral neural foraminal narrowing. C6-C7: Posterior disc osteophyte complex. Uncinate/facet hypertrophy. Mild spinal canal stenosis. Severe bilateral neural foraminal narrowing. C7-T1: No disc herniation. No significant canal or foraminal stenosis. IMPRESSION: Prominent spinal cord signal abnormality at the C4 through C6 levels. There is involvement of the entire cord cross-section at the lower C5 level. Differential considerations include spinal cord infarct, contusion, demyelinating disease or other inflammatory/infectious process. Edema signal within the interspinous spaces at the C3-T2 levels, which may reflect interspinous ligament injury. Cervical spondylosis as described and most notably as follows. At C4-C5, a posterior disc osteophyte complex contributes to severe spinal canal stenosis with mild spinal cord flattening. Bilateral neural foraminal narrowing (moderate right, severe left) No more than mild spinal canal stenosis at the remaining levels. Additional sites of neural foraminal narrowing, including site of severe and moderate/severe neural foraminal narrowing as detailed. Electronically Signed   By: Jackey LogeKyle  Golden DO   On: 12/20/2019 17:32   MR THORACIC SPINE WO CONTRAST  Result Date: 12/20/2019 CLINICAL DATA:  Distal greater than proximal weakness, trauma/fall at home prior to admission. EXAM: MRI THORACIC SPINE WITHOUT CONTRAST TECHNIQUE: Multiplanar, multisequence MR imaging of the thoracic spine was performed. No intravenous contrast was  administered. COMPARISON:  CT chest 12/18/2019 FINDINGS: Mildly motion degraded examination. Alignment:  Alignment is maintained. Vertebrae: Vertebral body height is maintained. No significant marrow edema or suspicious osseous lesion. Cord:  No spinal cord signal abnormality is identified. Paraspinal and other soft tissues: Mild nonspecific edema within the dorsal subcutaneous soft tissues overlying the lower thoracic and visualized upper lumbar spine. No abnormality identified within included portions of the  thorax or upper abdomen. Disc levels: Mild disc degeneration throughout the thoracic spine. Mild multilevel disc bulges and facet arthrosis/ligamentum flavum hypertrophy. No significant spinal canal stenosis at any level. No compressive foraminal stenosis. IMPRESSION: Mildly motion degraded examination. No spinal cord signal abnormality is identified. Mild thoracic spondylosis with no significant spinal canal stenosis, and no compressive foraminal narrowing. Electronically Signed   By: Jackey LogeKyle  Golden DO   On: 12/20/2019 17:59   VAS US LOWER EXTREMITY VENOUS (DVT)  Result Date: 12/20/2019  Lower Venous DVTStudy Indications: Pulmonary embolism.  Anticoagulation: Heparin. Comparison Study: No prior exam. Performing Technologist: Kennedy BuckerKristy Siebrecht ARDMS, RVT  Examination Guidelines: A complete evaluation includes B-mode imaging, spectral Doppler, color Doppler, and power Doppler as needed of all accessible portions of each vessel. Bilateral testing is considered an integral part of a complete examination. Limited examinations for reoccurring indications may be performed as noted. The reflux portion of the exam is performed with the patient in reverse Trendelenburg.  +---------+---------------+---------+-----------+----------+--------------+  RIGHT     Compressibility Phasicity Spontaneity Properties Thrombus Aging  +---------+---------------+---------+-----------+----------+--------------+  CFV       Full             Yes       Yes                                    +---------+---------------+---------+-----------+----------+--------------+  SFJ       Full                                                             +---------+---------------+---------+-----------+----------+--------------+  FV Prox   Full                                                             +---------+---------------+---------+-----------+----------+--------------+  FV Mid    Full                                                             +---------+---------------+---------+-----------+----------+--------------+  FV Distal Full                                                             +---------+---------------+---------+-----------+----------+--------------+  PFV       Full                                                             +---------+---------------+---------+-----------+----------+--------------+  POP       Full  Yes       Yes                                    +---------+---------------+---------+-----------+----------+--------------+  PTV       Full                                                             +---------+---------------+---------+-----------+----------+--------------+  PERO      Full                                                             +---------+---------------+---------+-----------+----------+--------------+   +---------+---------------+---------+-----------+----------+--------------+  LEFT      Compressibility Phasicity Spontaneity Properties Thrombus Aging  +---------+---------------+---------+-----------+----------+--------------+  CFV       Full            Yes       Yes                                    +---------+---------------+---------+-----------+----------+--------------+  SFJ       Full                                                             +---------+---------------+---------+-----------+----------+--------------+  FV Prox   Full                                                              +---------+---------------+---------+-----------+----------+--------------+  FV Mid    Full                                                             +---------+---------------+---------+-----------+----------+--------------+  FV Distal Full                                                             +---------+---------------+---------+-----------+----------+--------------+  PFV       Full                                                             +---------+---------------+---------+-----------+----------+--------------+  POP       Full            Yes       Yes                                    +---------+---------------+---------+-----------+----------+--------------+  PTV       Full                                                             +---------+---------------+---------+-----------+----------+--------------+  PERO      Full                                                             +---------+---------------+---------+-----------+----------+--------------+     Summary: BILATERAL: - No evidence of deep vein thrombosis seen in the lower extremities, bilaterally.  RIGHT: - No cystic structure found in the popliteal fossa.  LEFT: - No cystic structure found in the popliteal fossa.  *See table(s) above for measurements and observations. Electronically signed by Fabienne Bruns MD on 12/20/2019 at 5:54:34 PM.    Final       Scheduled Meds:  Chlorhexidine Gluconate Cloth  6 each Topical Daily   Continuous Infusions:  sodium chloride 10 mL/hr at 12/21/19 1600   cefTRIAXone (ROCEPHIN)  IV 2 g (12/21/19 2128)   heparin 750 Units/hr (12/21/19 1523)     LOS: 3 days      Time spent: 25 minutes   Noralee Stain, DO [facility] 12/22/2019, 11:47 AM   Available via Epic secure chat 7am-7pm After these hours, please refer to coverage provider listed on [email or web address]

## 2019-12-22 NOTE — Progress Notes (Signed)
Physical Therapy Treatment Patient Details Name: Anna Campos MRN: [id number] DOB: 1966/09/25 Today's Date: 12/22/2019    History of Present Illness 54 year old woman with no significant past medical history presenting with syncope and s/p fall. Pt found to have incomplete spincal cord injury, bacteremia, and bilat PEs. N/S following, plan for surgery in 2 weeks.    PT Comments    Pt with increased bilat LE tone today requiring more assist to bend at hips, knees, and ankles. Pt tolerated 3 standing trials and was able to complete LAQ bilat and heel slides back with assist from PT.  Pt continues to have minimal use of bilat hands functionally and remains to require maxAx2 with all transfers. Pt continues to be an excellent candidate for CIR. Acute PT to cont to follow.    Follow Up Recommendations  CIR     Equipment Recommendations       Recommendations for Other Services Rehab consult     Precautions / Restrictions Precautions Precautions: Fall Precaution Comments: central cord, on heparin drip Restrictions Weight Bearing Restrictions: No    Mobility  Bed Mobility Overal bed mobility: Needs Assistance Bed Mobility: Rolling;Sidelying to Sit;Sit to Sidelying Rolling: Max assist Sidelying to sit: Max assist       General bed mobility comments: pt initiating with R UE with rolling to the L, unable to move LEs, maxA for trunk elevation  Transfers Overall transfer level: Needs assistance Equipment used: 2 person hand held assist(2 person lift with gait belt and bed pad) Transfers: Sit to/from Stand;Stand Pivot Transfers Sit to Stand: Max assist;+2 physical assistance Stand pivot transfers: Max assist;+2 physical assistance       General transfer comment: pt with increased tone in LEs today, maxAx2 to power up, bilat knee blocking, pt able to achieve active back extension and stand upright, pt unable to use bilat UEs for support at this time, tactile cues provided at hips to  weight shift L/R and maxA to advance R/L LE during std pvt transfer to chair. completed 3 stands, last stand 1 min long  Ambulation/Gait             General Gait Details: unable at this time   Stairs             Wheelchair Mobility    Modified Rankin (Stroke Patients Only)       Balance Overall balance assessment: Needs assistance Sitting-balance support: Feet supported;Bilateral upper extremity supported Sitting balance-Leahy Scale: Fair Sitting balance - Comments: pt tolerated EOB x 15 min today, pt able to maintain EOB balance, midline and upright posture without physical assist, pt able to correct with verbal cues   Standing balance support: Bilateral upper extremity supported Standing balance-Leahy Scale: Zero Standing balance comment: dependent on physical assistx2                            Cognition Arousal/Alertness: Awake/alert Behavior During Therapy: WFL for tasks assessed/performed Overall Cognitive Status: Within Functional Limits for tasks assessed                                        Exercises      General Comments General comments (skin integrity, edema, etc.): pt with cut under R eye      Pertinent Vitals/Pain Pain Assessment: No/denies pain    Home Living  Prior Function            PT Goals (current goals can now be found in the care plan section) Progress towards PT goals: Progressing toward goals    Frequency    Min 4X/week      PT Plan Current plan remains appropriate    Co-evaluation              AM-PAC PT "6 Clicks" Mobility   Outcome Measure  Help needed turning from your back to your side while in a flat bed without using bedrails?: A Lot Help needed moving from lying on your back to sitting on the side of a flat bed without using bedrails?: A Lot Help needed moving to and from a bed to a chair (including a wheelchair)?: Total Help needed standing  up from a chair using your arms (e.g., wheelchair or bedside chair)?: Total Help needed to walk in hospital room?: Total Help needed climbing 3-5 steps with a railing? : Total 6 Click Score: 8    End of Session Equipment Utilized During Treatment: Gait belt Activity Tolerance: Patient tolerated treatment well Patient left: with call bell/phone within reach;in chair;with chair alarm set;with nursing/sitter in room Nurse Communication: Mobility status PT Visit Diagnosis: Unsteadiness on feet (R26.81);Other abnormalities of gait and mobility (R26.89);Repeated falls (R29.6);Muscle weakness (generalized) (M62.81);History of falling (Z91.81);Ataxic gait (R26.0);Difficulty in walking, not elsewhere classified (R26.2)     Time: 2703-5009 PT Time Calculation (min) (ACUTE ONLY): 31 min  Charges:  $Gait Training: 8-22 mins $Neuromuscular Re-education: 8-22 mins                     Anna Campos, PT, DPT Acute Rehabilitation Services Pager #: [phone] Office #: [phone]    Anna Campos 12/22/2019, 1:36 PM

## 2019-12-22 NOTE — Progress Notes (Signed)
Occupational Therapy Treatment Patient Details Name: Anna Campos MRN: [id number] DOB: Nov 16, 1965 Today's Date: 12/22/2019    History of present illness 54 year old woman with no significant past medical history presenting with syncope and s/p fall. Pt found to have incomplete spincal cord injury, bacteremia, and bilat PEs. N/S following, plan for surgery in 2 weeks.   OT comments  Pt progressing to OOB functional transfers and EOB truncal challenges. Pt continues to require maxA to totalA for ADL. Pt with strength for elbow flex/ext for self feeding, but pt with poor grip strength to complete task. Pt with increased wrist flexion today with more control and less "floppy." Pt performing exercises at EOB and in supported sitting in bed with increased ability. Pt continues to be extremely weak and maxA+2 for bed mobility and stand pivot transfers with knee blocking. Pt would greatly benefit from continued OT skilled services. OT following acutely.   Follow Up Recommendations  CIR    Equipment Recommendations  3 in 1 bedside commode    Recommendations for Other Services      Precautions / Restrictions Precautions Precautions: Fall Precaution Comments: central cord, on heparin drip Restrictions Weight Bearing Restrictions: No       Mobility Bed Mobility Overal bed mobility: Needs Assistance Bed Mobility: Rolling;Sit to Supine Rolling: Max assist Sidelying to sit: Max assist   Sit to supine: Max assist   General bed mobility comments: Pt initiating with LUE to roll; near dependence for rolling  Transfers Overall transfer level: Needs assistance Equipment used: 2 person hand held assist Transfers: Sit to/from [employer] Sit to Stand: Max assist;+2 physical assistance Stand pivot transfers: Max assist;+2 physical assistance       General transfer comment: maxA+2 for power up; blocked knees once upright; pt able to extend both knees and facilitation required to  move BLEs.    Balance Overall balance assessment: Needs assistance Sitting-balance support: Feet supported;Bilateral upper extremity supported Sitting balance-Leahy Scale: Fair Sitting balance - Comments: Pt sitting edgeof chair x2 mins for truncal balance challenges   Standing balance support: Bilateral upper extremity supported Standing balance-Leahy Scale: Zero Standing balance comment: dependent on physical assistx2; knees required blocking                           ADL either performed or assessed with clinical judgement   ADL Overall ADL's : Needs assistance/impaired Eating/Feeding: Maximal assistance;Sitting(supported sitting) Eating/Feeding Details (indicate cue type and reason): Pt with practice can perform lap to mouth pattern, but unable to grip built up spoon                                 Functional mobility during ADLs: Maximal assistance;+2 for physical assistance;+2 for safety/equipment;Cueing for safety General ADL Comments: Pt maxA to totalA due to weakness, decreased activity tolerance and decreased coordination.     Vision   Vision Assessment?: No apparent visual deficits   Perception     Praxis      Cognition Arousal/Alertness: Awake/alert Behavior During Therapy: WFL for tasks assessed/performed Overall Cognitive Status: Within Functional Limits for tasks assessed                                          Exercises Exercises: Other exercises Other Exercises Other Exercises: sitting posture; shoulder  protraction/retraction x10 reps Other Exercises: shoulder flex, elbow flex/ext and wrist flex/ext with digit PROM   Shoulder Instructions       General Comments Cut under eye, bandaged. Pt's spouse in room after session concluded.    Pertinent Vitals/ Pain       Pain Assessment: No/denies pain  Home Living                                          Prior Functioning/Environment               Frequency  Min 3X/week        Progress Toward Goals  OT Goals(current goals can now be found in the care plan section)  Progress towards OT goals: Progressing toward goals  Acute Rehab OT Goals Patient Stated Goal: "I want to walk again" OT Goal Formulation: With patient Time For Goal Achievement: 01/03/20 Potential to Achieve Goals: Good ADL Goals Pt Will Perform Eating: with min assist;with adaptive utensils;sitting Pt Will Perform Grooming: with min assist;sitting Pt Will Perform Upper Body Dressing: with min assist;sitting Pt Will Transfer to Toilet: with max assist;stand pivot transfer;bedside commode Pt/caregiver will Perform Home Exercise Program: Increased ROM;Increased strength;Both right and left upper extremity;With minimal assist Additional ADL Goal #1: Pt will increase to fair dynamic sitting balance in prep for ADL tasks.  Plan Discharge plan remains appropriate;Frequency needs to be updated    Co-evaluation                 AM-PAC OT "6 Clicks" Daily Activity     Outcome Measure   Help from another person eating meals?: Total Help from another person taking care of personal grooming?: Total Help from another person toileting, which includes using toliet, bedpan, or urinal?: Total Help from another person bathing (including washing, rinsing, drying)?: Total Help from another person to put on and taking off regular upper body clothing?: A Lot Help from another person to put on and taking off regular lower body clothing?: Total 6 Click Score: 7    End of Session Equipment Utilized During Treatment: Gait belt  OT Visit Diagnosis: Unsteadiness on feet (R26.81);Muscle weakness (generalized) (M62.81)   Activity Tolerance Treatment limited secondary to medical complications (Comment);Patient limited by fatigue   Patient Left in bed;with call bell/phone within reach;with bed alarm set;with family/visitor present   Nurse Communication Mobility  status        Time: 2671-2458 OT Time Calculation (min): 28 min  Charges: OT General Charges $OT Visit: 1 Visit OT Treatments $Neuromuscular Re-education: 8-22 mins $Therapeutic Exercise: 8-22 mins  Jefferey Pica, OTR/L Acute Rehabilitation Services Pager: [phone] Office: [phone]    Analycia Khokhar C 12/22/2019, 3:44 PM

## 2019-12-22 NOTE — Progress Notes (Addendum)
ANTICOAGULATION CONSULT NOTE - Follow Up Consult  Pharmacy Consult for Heparin Indication: pulmonary embolus  Allergies  Allergen Reactions  . Ampicillin Diarrhea    Patient Measurements: Height: 5\' 9"  (175.3 cm) Weight: 136 lb 11 oz (62 kg) IBW/kg (Calculated) : 66.2 Heparin Dosing Weight: 62 kg  Vital Signs: Temp: 98.5 F (36.9 C) (03/03 0404) Temp Source: Oral (03/03 0404) BP: 117/65 (03/03 0404) Pulse Rate: 57 (03/03 0404)  Labs: Recent Labs    12/20/19 0325 12/20/19 1850 12/21/19 0415 12/21/19 0415 12/21/19 1400 12/21/19 2114 12/22/19 0612  HGB 9.3*  --  9.9*  --   --   --  9.6*  HCT 28.3*  --  29.9*  --   --   --  29.6*  PLT 201  --  180  --   --   --  181  HEPARINUNFRC  --    < > 1.06*   < > 0.89* 0.56 0.50  CREATININE 0.79  --  0.80  --   --   --   --    < > = values in this interval not displayed.    Estimated Creatinine Clearance: 80.5 mL/min (by C-G formula based on SCr of 0.8 mg/dL).  Assessment:  54 yr old female with small bilateral PE on CTA.  Pharmacy dosing heparin.    Heparin level therapeutic (0.50) on 750 units/hr. Mid-range therapeutic.   RN reports bleeding from facial laceration, sustained in fall PTA.  MD notified, heparin drip to continue.  RN currently holding pressure.  Goal of Therapy:  Heparin level 0.3-0.7 units/ml Monitor platelets by anticoagulation protocol: Yes   Plan:   Continue heparin drip at 750 units/hr  Daily heparin level and CBC  Monitor for resolution of facial bleeding.   - Addendum:  Bleeding subsided this morning.  Will continue to monitor.  44, Dennie Fetters [place] 12/22/2019,10:30 AM

## 2019-12-22 NOTE — Consult Note (Signed)
DENTAL CONSULTATION  Date of Consultation:  12/22/2019 Patient Name:   Anna Campos Date of Birth:   [birth year] Medical Record Number: [id number]  COVID 19 SCREENING: The patient does not symptoms concerning for COVID-19 infection (Including fever, chills, cough, or new SHORTNESS OF BREATH).    VITALS: BP 117/65 (BP Location: Right Arm)   Pulse (!) 57   Temp 98.5 F (36.9 C) (Oral)   Resp 15   Ht 5\' 9"  (1.753 m)   Wt 62 kg   SpO2 100%   BMI 20.18 kg/m   CHIEF COMPLAINT: Patient referred by Dr. Dessa Phi for dental consultation.  HPI: Anna Campos is a 54 year old female recently diagnosed with bacteremia.  Patient currently on IV antibiotic therapy through infectious disease.  Patient now seen to evaluate poor dentition and rule out dental source of the bacteremia.  Patient currently denies acute toothaches, swellings, or abscesses.  Patient last seen by dentist approximately 1 year ago.  This was with Dr. Vale Haven.  The patient did not proceed with dental treatment at that time and had made an appointment on 01/13/2020 with another dentist that was in her network.  Patient was going to be evaluated for extraction of the maxillary anterior tooth. Patient denies having any partial dentures.  Patient indicates she does have some dental phobia.  PROBLEM LIST: Patient Active Problem List   Diagnosis Date Noted  . Quadriplegia ([place])   . Hypotension   . Bacteremia   . Syncope 12/19/2019    PMH: Past Medical History:  Diagnosis Date  . PE (pulmonary thromboembolism) ([place]) 12/20/2019  . Syncope 12/20/2019    PSH: Past Surgical History:  Procedure Laterality Date  . CESAREAN SECTION      ALLERGIES: Allergies  Allergen Reactions  . Ampicillin Diarrhea    MEDICATIONS: Current Facility-Administered Medications  Medication Dose Route Frequency Provider Last Rate Last Admin  . 0.9 %  sodium chloride infusion   Intravenous PRN Dessa Phi, DO 10 mL/hr at 12/21/19  1600 Rate Verify at 12/21/19 1600  . acetaminophen (TYLENOL) tablet 650 mg  650 mg Oral Q4H PRN Milagros Loll, MD   650 mg at 12/21/19 1857  . cefTRIAXone (ROCEPHIN) 2 g in sodium chloride 0.9 % 100 mL IVPB  2 g Intravenous Q24H Carlyle Basques, MD 200 mL/hr at 12/21/19 2128 2 g at 12/21/19 2128  . Chlorhexidine Gluconate Cloth 2 % PADS 6 each  6 each Topical Daily Candee Furbish, MD   6 each at 12/22/19 1000  . heparin ADULT infusion 100 units/mL (25000 units/229mL sodium chloride 0.45%)  750 Units/hr Intravenous Continuous Bertis Ruddy, RPH 7.5 mL/hr at 12/21/19 1523 750 Units/hr at 12/21/19 1523  . naproxen (NAPROSYN) tablet 250 mg  250 mg Oral BID PRN Candee Furbish, MD   250 mg at 12/21/19 0941    LABS: Lab Results  Component Value Date   WBC 6.5 12/22/2019   HGB 9.6 (L) 12/22/2019   HCT 29.6 (L) 12/22/2019   MCV 91.1 12/22/2019   PLT 181 12/22/2019      Component Value Date/Time   NA 141 12/21/2019 0415   K 3.8 12/21/2019 0415   CL 113 (H) 12/21/2019 0415   CO2 21 (L) 12/21/2019 0415   GLUCOSE 80 12/21/2019 0415   BUN 6 12/21/2019 0415   CREATININE 0.80 12/21/2019 0415   CALCIUM 8.2 (L) 12/21/2019 0415   GFRNONAA >60 12/21/2019 0415   GFRAA >60 12/21/2019 0415   Lab Results  Component Value  Date   INR 1.1 12/18/2019   No results found for: PTT  SOCIAL HISTORY: Social History   Socioeconomic History  . Marital status: Married    Spouse name: Not on file  . Number of children: Not on file  . Years of education: Not on file  . Highest education level: Not on file  Occupational History  . Not on file  Tobacco Use  . Smoking status: Former Games developer  . Smokeless tobacco: Never Used  Substance and Sexual Activity  . Alcohol use: Yes    Comment: OCCASIONAL  . Drug use: Not Currently  . Sexual activity: Not on file  Other Topics Concern  . Not on file  Social History Narrative  . Not on file   Social Determinants of Health   Financial Resource Strain:    . Difficulty of Paying Living Expenses: Not on file  Food Insecurity:   . Worried About Programme researcher, broadcasting/film/video in the Last Year: Not on file  . Ran Out of Food in the Last Year: Not on file  Transportation Needs:   . Lack of Transportation (Medical): Not on file  . Lack of Transportation (Non-Medical): Not on file  Physical Activity:   . Days of Exercise per Week: Not on file  . Minutes of Exercise per Session: Not on file  Stress:   . Feeling of Stress : Not on file  Social Connections:   . Frequency of Communication with Friends and Family: Not on file  . Frequency of Social Gatherings with Friends and Family: Not on file  . Attends Religious Services: Not on file  . Active Member of Clubs or Organizations: Not on file  . Attends Banker Meetings: Not on file  . Marital Status: Not on file  Intimate Partner Violence:   . Fear of Current or Ex-Partner: Not on file  . Emotionally Abused: Not on file  . Physically Abused: Not on file  . Sexually Abused: Not on file    FAMILY HISTORY: Family History  Problem Relation Age of Onset  . Breast cancer Mother     REVIEW OF SYSTEMS: Reviewed with the patient as per History of present illness. Psych: Patient has some dental phobia.  DENTAL HISTORY: CHIEF COMPLAINT: Patient referred by Dr. Noralee Stain for dental consultation.  HPI: Anna Campos is a 54 year old female recently diagnosed with bacteremia.  Patient currently on IV antibiotic therapy through infectious disease.  Patient now seen to evaluate poor dentition and rule out dental source of the bacteremia.  Patient currently denies acute toothaches, swellings, or abscesses.  Patient last seen by dentist approximately 1 year ago.  This was with Dr. Darnelle Maffucci.  The patient did not proceed with dental treatment at that time and had made an appointment on 01/13/2020 with another dentist that was in her network.  Patient was going to be evaluated for extraction of the  maxillary anterior tooth. Patient denies having any partial dentures.  Patient indicates she does have some dental phobia.  DENTAL EXAMINATION: GENERAL: The patient is a well-developed, nourished female sitting in her hospital bed. HEAD AND NECK: There is no palpable neck lymphadenopathy.  The patient denies acute TMJ symptoms. INTRAORAL EXAM: Patient has normal saliva.  There is no evidence of oral abscess formation.  The patient has bilateral mandibular lingual tori. DENTITION: Patient with multiple missing teeth and multiple retained root segments.  PERIODONTAL: The patient has chronic, advanced periodontal disease with plaque and calculus accumulations, gingival recession,  and tooth mobility.  The maxillary anterior teeth has significant mobility. DENTAL CARIES/SUBOPTIMAL RESTORATIONS: Multiple dental caries are noted. ENDODONTIC: The patient currently denies acute pulpitis symptoms.  The patient has multiple areas of periapical pathology and radiolucency. CROWN AND BRIDGE: There are no crown or bridge restorations noted. PROSTHODONTIC: Patient denies having partial dentures. OCCLUSION: Patient is a poor occlusal scheme secondary to multiple missing teeth, supra eruption drifting of the unopposed teeth into the edentulous areas, multiple malpositioned teeth, and lack of replacement of the missing teeth with dental prostheses.   RADIOGRAPHIC INTERPRETATION: An orthopantogram was obtained on 12/24/2019 due to the expertise of Fredricka Bonine, radiology technician. There are multiple missing teeth.  There are multiple retained root segments.  There is supra eruption drifting of the unopposed teeth into the edentulous areas.  There are multiple dental caries.  There is moderate to severe bone loss.  There is evidence of periapical pathology and radiolucency.  There are multiple malpositioned teeth.   ASSESSMENTS: 1.  Streptococcal bacteremia 2.  Chronic apical periodontitis 3.  Multiple retained  root segments  4.  Multiple dental caries  5.  Chronic periodontitis with bone loss 6.  Gingival recession 7.  Accretions 8.  Tooth mobility 9.  Bilateral mandibular lingual tori 10.  Multiple missing teeth  11.  Supra eruption and drifting of the unopposed teeth into the edentulous areas 12.  Multiple malpositioned teeth  13.  Traumatic occlusion 14.  Risk of bleeding with invasive dental procedures due to current anticoagulant therapy   PLAN/RECOMMENDATIONS: 1. I discussed the risks, benefits, and complications of various treatment options with the patient in relationship to her medical and dental conditions, current endocarditis, and risk for future endocarditis and infection of the anticipated neurosurgical procedures. We discussed various treatment options to include no treatment, total and subtotal extractions with alveoloplasty, pre-prosthetic surgery as indicated, periodontal therapy, dental restorations, root canal therapy, crown and bridge therapy, implant therapy, and replacement of missing teeth as indicated. The patient currently wishes to proceed with extraction of all remaining teeth with alveoloplasty and preprosthetic surgery as needed in the operating room with general anesthesia.  Heparin therapy will need to be discontinued 6 hours before the oral surgery and held for an additional 12 hours after the surgical procedures.  The patient will then need to follow-up with a dentist of her choice for fabrication of upper and lower complete denture after adequate healing and once medically stable from the anticipated neurosurgical procedures.  I will need to coordinate discontinuation of the anticoagulation therapy once time and space for the dental operating procedure can be obtained for next week.  In the meantime the patient remains on IV antibiotic therapy ordered by the hospitalist.  2. Discussion of findings with medical team and coordination of future medical and dental care as  needed.    Charlynne Pander, DDS

## 2019-12-22 NOTE — Progress Notes (Signed)
[facility]    Date of Admission:  12/18/2019   Total days of antibiotics 4/day 2 of ceftriaxone           ID: Anna Campos is a 54 y.o. female with streptococcal bacteremia Active Problems:   Syncope   Quadriplegia (HCC)   Hypotension   Bacteremia    Subjective: Afebrile, feeling better. Sitting up in chair without discomfort  Medications:  . Chlorhexidine Gluconate Cloth  6 each Topical Daily    Objective: Vital signs in last 24 hours: Temp:  [98.4 F (36.9 C)-98.6 F (37 C)] 98.5 F (36.9 C) (03/03 0404) Pulse Rate:  [57-62] 57 (03/03 0404) Resp:  [15-16] 15 (03/03 0404) BP: (111-123)/(65-76) 117/65 (03/03 0404) SpO2:  [99 %-100 %] 100 % (03/03 0404) Weight:  [62 kg] 62 kg (03/03 0100) Physical Exam  Constitutional:  oriented to person, place, and time. appears well-developed and well-nourished. No distress.  HENT: [place]/AT, PERRLA, no scleral icterus Mouth/Throat: Oropharynx is clear and moist. No oropharyngeal exudate. Poor dentition Cardiovascular: Normal rate, regular rhythm and normal heart sounds. Exam reveals no gallop and no friction rub.  No murmur heard.  Pulmonary/Chest: Effort normal and breath sounds normal. No respiratory distress.  has no wheezes.  Neck = supple, no nuchal rigidity Abdominal: Soft. Bowel sounds are normal.  exhibits no distension. There is no tenderness.  Lymphadenopathy: no cervical adenopathy. No axillary adenopathy Neurological: alert and oriented to person, place, and time. Able to lift arms (proximal strength is 4/5) but no grip strength or ability to flex wrists Skin: Skin is warm and dry. No rash noted. No erythema.  Psychiatric: a normal mood and affect.  behavior is normal.    Lab Results Recent Labs    12/20/19 0325 12/20/19 0325 12/21/19 0415 12/22/19 0612  WBC 8.4   < > 5.9 6.5  HGB 9.3*   < > 9.9* 9.6*  HCT 28.3*   < > 29.9* 29.6*  NA 140  --  141  --   K 3.7  --  3.8  --   CL 115*   --  113*  --   CO2 21*  --  21*  --   BUN <5*  --  6  --   CREATININE 0.79  --  0.80  --    < > = values in this interval not displayed.    Microbiology: 3/1 blood cx ngtd 2/28 blood cx ngtd 2/27 blood cx Culture STREPTOCOCCUS CONSTELLATUSAbnormal    Report Status 12/22/2019 FINAL   Organism ID, Bacteria STREPTOCOCCUS CONSTELLATUS   Resulting Agency [facility]  Susceptibility   Streptococcus constellatus    MIC    CEFTRIAXONE 1 SENSITIVE  Sensitive    ERYTHROMYCIN <=0.12 SENS... Sensitive    LEVOFLOXACIN 0.5 SENSITIVE  Sensitive    PENICILLIN 0.25 INTERM... Intermediate    VANCOMYCIN 0.5 SENSITIVE  Sensitive        Studies/Results: MR BRAIN WO CONTRAST  Result Date: 12/20/2019 CLINICAL DATA:  Neuro deficit, subacute. Additional history provided: Syncope, generalized fatigue, poor p.o. intake. EXAM: MRI HEAD WITHOUT CONTRAST TECHNIQUE: Multiplanar, multiecho pulse sequences of the brain and surrounding structures were obtained without intravenous contrast. COMPARISON:  Head CT 12/18/2019. FINDINGS: Brain: The examination is intermittently motion degraded. Most notably there is mild-to-moderate motion degradation of the axial T2 FLAIR sequence and moderate/severe motion degradation of the coronal T2 weighted sequence. There is no evidence of acute infarct. No evidence of intracranial mass. No midline  shift or extra-axial fluid collection. No chronic intracranial blood products. Mild scattered T2/FLAIR hyperintensity within the cerebral white matter is nonspecific, but most commonly seen on the basis of chronic small vessel ischemic disease. Mild generalized parenchymal atrophy. Incidentally noted cavum septum pellucidum and cavum vergae. Vascular: Visualized orbits demonstrate no acute abnormality. Skull and upper cervical spine: No focal marrow lesion. Sinuses/Orbits: Visualized orbits demonstrate no acute abnormality. Mild scattered paranasal sinus mucosal thickening greatest within  bilateral ethmoid air cells. No significant mastoid effusion. IMPRESSION: 1. Intermittently motion degraded exam. 2. No evidence of acute intracranial abnormality. 3. Mild scattered T2 hyperintense signal changes within the cerebral white matter are nonspecific, but most commonly seen on the basis of chronic small vessel ischemic disease. 4. Mild generalized parenchymal atrophy. 5. Mild paranasal sinus mucosal thickening. Electronically Signed   By: Jackey Loge DO   On: 12/20/2019 16:57   MR CERVICAL SPINE WO CONTRAST  Result Date: 12/20/2019 CLINICAL DATA:  Spinal stenosis, cervical spine. EXAM: MRI CERVICAL SPINE WITHOUT CONTRAST TECHNIQUE: Multiplanar, multisequence MR imaging of the cervical spine was performed. No intravenous contrast was administered. COMPARISON:  CT cervical spine 12/18/2019. FINDINGS: Mildly motion degraded examination Alignment: Mild reversal of the expected cervical lordosis. No significant spondylolisthesis. Vertebrae: Vertebral body height is maintained. No significant marrow edema or suspicious osseous lesion. Multilevel degenerative endplate irregularity and mixed degenerative endplate marrow signal. This includes mild degenerative endplate edema at W0-J8. Cord: There is prominent T2/STIR hyperintense cord signal abnormality at the C4 through C6 levels. The cord signal abnormality involves the entire cord at the lower C5 level. Posterior Fossa, vertebral arteries, paraspinal tissues: No abnormality identified within included portions of the posterior fossa. Flow voids preserved within the cervical vertebral arteries. There is STIR hyperintense signal within the interspinous spaces at the C3-C4 through T1-T2 levels. Disc levels: Moderate/severe C3-C4 disc degeneration. Moderate disc degeneration at the remaining cervical levels. C2-C3: No disc herniation. No significant canal or foraminal stenosis. C3-C4: Posterior disc osteophyte complex. Uncinate/facet hypertrophy. Mild spinal  canal stenosis. There is contact upon the ventral spinal cord with minimal flattening of the ventral cord. Bilateral neural foraminal narrowing (moderate right, moderate/severe left). C4-C5: Posterior disc osteophyte complex. Uncinate/facet hypertrophy. Severe spinal canal stenosis with mild spinal cord flattening. Bilateral neural foraminal narrowing (moderate right, severe left). C5-C6: Posterior disc osteophyte complex. Uncinate/facet hypertrophy. Mild spinal canal stenosis. Severe bilateral neural foraminal narrowing. C6-C7: Posterior disc osteophyte complex. Uncinate/facet hypertrophy. Mild spinal canal stenosis. Severe bilateral neural foraminal narrowing. C7-T1: No disc herniation. No significant canal or foraminal stenosis. IMPRESSION: Prominent spinal cord signal abnormality at the C4 through C6 levels. There is involvement of the entire cord cross-section at the lower C5 level. Differential considerations include spinal cord infarct, contusion, demyelinating disease or other inflammatory/infectious process. Edema signal within the interspinous spaces at the C3-T2 levels, which may reflect interspinous ligament injury. Cervical spondylosis as described and most notably as follows. At C4-C5, a posterior disc osteophyte complex contributes to severe spinal canal stenosis with mild spinal cord flattening. Bilateral neural foraminal narrowing (moderate right, severe left) No more than mild spinal canal stenosis at the remaining levels. Additional sites of neural foraminal narrowing, including site of severe and moderate/severe neural foraminal narrowing as detailed. Electronically Signed   By: Jackey Loge DO   On: 12/20/2019 17:32   MR THORACIC SPINE WO CONTRAST  Result Date: 12/20/2019 CLINICAL DATA:  Distal greater than proximal weakness, trauma/fall at home prior to admission. EXAM: MRI THORACIC SPINE WITHOUT CONTRAST TECHNIQUE:  Multiplanar, multisequence MR imaging of the thoracic spine was performed.  No intravenous contrast was administered. COMPARISON:  CT chest 12/18/2019 FINDINGS: Mildly motion degraded examination. Alignment:  Alignment is maintained. Vertebrae: Vertebral body height is maintained. No significant marrow edema or suspicious osseous lesion. Cord:  No spinal cord signal abnormality is identified. Paraspinal and other soft tissues: Mild nonspecific edema within the dorsal subcutaneous soft tissues overlying the lower thoracic and visualized upper lumbar spine. No abnormality identified within included portions of the thorax or upper abdomen. Disc levels: Mild disc degeneration throughout the thoracic spine. Mild multilevel disc bulges and facet arthrosis/ligamentum flavum hypertrophy. No significant spinal canal stenosis at any level. No compressive foraminal stenosis. IMPRESSION: Mildly motion degraded examination. No spinal cord signal abnormality is identified. Mild thoracic spondylosis with no significant spinal canal stenosis, and no compressive foraminal narrowing. Electronically Signed   By: Kellie Simmering DO   On: 12/20/2019 17:59     Assessment/Plan: Streptococcal bacteremia = thought to be due to poor dentition. Recommend 2 wk of ceftriaxone 2gm iv daily, using 2/28 as day 1. Currently on day 4 of 14. Can stop treatment on 3/14. If she tolerates PIV, no need for picc line since going to CIR. She cleared bacteremia rapidly thus do not suspect endocarditis. Her TTE did not show any vegetations.  2. Poor dentition = recommend evaluation of teeth and possible extraction prior to spinal stenosis decompression/stabilization if hardware is to be placed. However, I am not sure what stability of spine would preclude her from dental surgery  Will sign off  [facility] Cell: [phone] Pager: [phone]  12/22/2019, 4:14 PM

## 2019-12-23 DIAGNOSIS — K089 Disorder of teeth and supporting structures, unspecified: Secondary | ICD-10-CM

## 2019-12-23 LAB — CBC
HCT: 28.1 % — ABNORMAL LOW (ref 36.0–46.0)
Hemoglobin: 9.1 g/dL — ABNORMAL LOW (ref 12.0–15.0)
MCH: 29.7 pg (ref 26.0–34.0)
MCHC: 32.4 g/dL (ref 30.0–36.0)
MCV: 91.8 fL (ref 80.0–100.0)
Platelets: 205 10*3/uL (ref 150–400)
RBC: 3.06 MIL/uL — ABNORMAL LOW (ref 3.87–5.11)
RDW: 12.4 % (ref 11.5–15.5)
WBC: 6.3 10*3/uL (ref 4.0–10.5)
nRBC: 0 % (ref 0.0–0.2)

## 2019-12-23 LAB — HEPARIN LEVEL (UNFRACTIONATED)
Heparin Unfractionated: 0.28 IU/mL — ABNORMAL LOW (ref 0.30–0.70)
Heparin Unfractionated: 0.33 IU/mL (ref 0.30–0.70)

## 2019-12-23 MED ORDER — SALINE SPRAY 0.65 % NA SOLN
1.0000 | NASAL | Status: DC | PRN
  Filled 2019-12-23: qty 44

## 2019-12-23 MED ORDER — BENZONATATE 100 MG PO CAPS
200.0000 mg | ORAL_CAPSULE | Freq: Three times a day (TID) | ORAL | Status: DC | PRN
  Administered 2019-12-24 – 2019-12-29 (×2): 200 mg via ORAL
  Filled 2019-12-23 (×2): qty 2

## 2019-12-23 MED ORDER — LORATADINE 10 MG PO TABS
10.0000 mg | ORAL_TABLET | Freq: Every day | ORAL | Status: DC
  Administered 2019-12-24 – 2020-01-11 (×17): 10 mg via ORAL
  Filled 2019-12-23 (×19): qty 1

## 2019-12-23 MED ORDER — METHOCARBAMOL 500 MG PO TABS
500.0000 mg | ORAL_TABLET | Freq: Three times a day (TID) | ORAL | Status: DC | PRN
  Administered 2019-12-23 – 2020-01-01 (×14): 500 mg via ORAL
  Filled 2019-12-23 (×15): qty 1

## 2019-12-23 NOTE — Progress Notes (Signed)
ANTICOAGULATION CONSULT NOTE - Follow Up Consult  Pharmacy Consult for Heparin Indication: pulmonary embolus  Allergies  Allergen Reactions  . Ampicillin Diarrhea    Patient Measurements: Height: 5\' 9"  (175.3 cm) Weight: 139 lb 12.4 oz (63.4 kg) IBW/kg (Calculated) : 66.2 Heparin Dosing Weight: 62 kg  Vital Signs: Temp: 97.9 F (36.6 C) (03/04 0929) Temp Source: Oral (03/04 0929) BP: 123/74 (03/04 0929) Pulse Rate: 73 (03/04 0929)  Labs: Recent Labs    12/21/19 0415 12/21/19 1400 12/22/19 0612 12/23/19 0248 12/23/19 1054  HGB 9.9*  --  9.6* 9.1*  --   HCT 29.9*  --  29.6* 28.1*  --   PLT 180  --  181 205  --   HEPARINUNFRC 1.06*   < > 0.50 0.28* 0.33  CREATININE 0.80  --   --   --   --    < > = values in this interval not displayed.    Estimated Creatinine Clearance: 82.3 mL/min (by C-G formula based on SCr of 0.8 mg/dL).  Assessment:  54 yr old female with small bilateral PE on CTA.  Pharmacy dosing heparin. On 3/3 RN reported bleeding from facial laceration, sustained in fall PTA. Bleeding resolved.  Plans noted for dental evaluation and also noted for cervical decompressive surgery when more stable. Oral anticoagulation plans are for a DOAC closer to discharge -hg= 9.1 -heparin level at goal on 850 units/hr   Goal of Therapy:  Heparin level 0.3-0.7 units/ml Monitor platelets by anticoagulation protocol: Yes   Plan:  -No heparin changes needed -Daily CBC and heparin level -Will follow procedural plans  5/3, PharmD Clinical Pharmacist **Pharmacist phone directory can now be found on [email or web address] ([email or web address]).  Listed under [facility].

## 2019-12-23 NOTE — Progress Notes (Addendum)
PROGRESS NOTE    Cardelia Sassano  UMP:[id number] DOB: 21-Nov-1965 DOA: 12/18/2019 PCP: Farris Has, MD   Brief Narrative:  HPI on 12/19/2019 by Dr. Griffin Basil (ICU) 54 year old woman with no significant past medical history presenting with syncope.  She has been having generalized fatigue.  She has had poor p.o. intake and has not eaten in the last day.  She stood up this evening and passed out.  Family heard her fall.  She did hit her head.  She was told she was not unconscious for a long period of time.  She was noted by EMS to be hypotensive and she was given a small fluid bolus.  Presently she complains of feeling pain all over her body.  She admits to using marijuana today.  She does not take any other medications other than a multivitamin.  She lives with her husband.  She denies that he takes any medications either.  She does not think that she could've inadvertently taken any antihypertensives.  She did recently have tooth pain and had taken some Tylenol with codeine several days ago.  None since.  She denies fevers or chills.  No sick contacts.  No dyspnea.  She notes that she had a loose stool the day before and none since.  No nausea or vomiting.  She feels lightheaded and has a headache.  Does not have vertigo.  She has diffuse myalgias and paresthesias.  Interim history Admitted to ICU due to syncope and hypovolemic shock.  Cardiology was consulted for EKG changes.  Patient was on Levophed however weaned off and transferred to [facility] service on 12/20/2019.  Wound to have Streptococcus bacteremia.  ID consulted and recommended 2 weeks of ceftriaxone.  Dentistry also consulted. Assessment & Plan   Hypovolemic shock and syncope -Patient was admitted to ICU and required Levophed which has been weaned off.  Cortisol 23.5 -Cardiology consulted his EKG showed nonspecific changes.  Troponin unremarkable.  Echocardiogram showed normal EF, normal RV size and function without valvular abnormalities.   No right heart strain.  No further cardiac work-up recommended this -BP is stable  Streptococcus constellatus bacteremia -Infectious disease consulted and appreciated -Recommended continuing ceftriaxone for 2 weeks with start date of 12/19/2019 -Repeat blood cultures from 12/20/2019 show no growth to date Patient unable to sit up straight for orthopantogram -CT maxillofacial completed on 12/18/2019 without mention of any dental caries or abscess -Patient is disease recommended dental evaluation -Dentistry, Dr. Kristin Bruins, consulted and appreciated  PE -Lower extremity ultrasound negative for DVT -Continue IV heparin  Bilateral extremity weakness with incomplete spinal cord injury C3-C6 -Distal lower extremity weaker compared to proximal -Neurosurgery consulted and appreciated.  Patient will eventually need multilevel cervical decompressive surgery once medically stabilized -PT and OT mended CIR -CIR consulted and pending evaluation  Normocytic anemia -Unknown baseline, hemoglobin currently 9 appears to be stable -Will continue to monitor closely as patient is on heparin  DVT Prophylaxis  heparin  Code Status: Full  Family Communication: None at bedside  Disposition Plan: Admitted from home. Found to have hypovolemic shock and syncope, now resolving.  Found to have strep bacteremia.  Pending dental consultation.  Patient will need completion of IV antibiotics.  Will also need to transition to DOAC prior to discharge. Disposition TBD.  Consultants PCCM Infectious disease Neurosurgery Inpatient rehab Dentistry Cardiology  Procedures  Echocardiogram Lower extremity Doppler  Antibiotics   Anti-infectives (From admission, onward)   Start     Dose/Rate Route Frequency Ordered Stop  12/21/19 1000  cefTRIAXone (ROCEPHIN) 2 g in sodium chloride 0.9 % 100 mL IVPB     2 g 200 mL/hr over 30 Minutes Intravenous Every 24 hours 12/21/19 0948 01/02/20 2359   12/20/19 2130  vancomycin  (VANCOREADY) IVPB 750 mg/150 mL  Status:  Discontinued     750 mg 150 mL/hr over 60 Minutes Intravenous Every 12 hours 12/20/19 1825 12/21/19 0948   12/20/19 1530  ceFAZolin (ANCEF) IVPB 2g/100 mL premix  Status:  Discontinued     2 g 200 mL/hr over 30 Minutes Intravenous Every 8 hours 12/20/19 1526 12/20/19 1814   12/20/19 1000  vancomycin (VANCOREADY) IVPB 750 mg/150 mL  Status:  Discontinued     750 mg 150 mL/hr over 60 Minutes Intravenous Every 12 hours 12/20/19 0054 12/20/19 1019   12/20/19 0130  vancomycin (VANCOREADY) IVPB 1250 mg/250 mL     1,250 mg 166.7 mL/hr over 90 Minutes Intravenous  Once 12/20/19 0054 12/20/19 0356      Subjective:   Zoye Chandra seen and examined today.  Complains of spasms and cramps in her legs.  Denies current chest pain or shortness of breath, abdominal pain, nausea or vomiting, diarrhea or constipation, dizziness or headache. Objective:   Vitals:   12/22/19 0404 12/22/19 2020 12/23/19 0250 12/23/19 0326  BP: 117/65 129/67  112/72  Pulse: (!) 57 72  63  Resp: 15 16  16   Temp: 98.5 F (36.9 C) 98.3 F (36.8 C)  98.4 F (36.9 C)  TempSrc: Oral Oral  Oral  SpO2: 100% 100%  100%  Weight:   63.4 kg   Height:        Intake/Output Summary (Last 24 hours) at 12/23/2019 0845 Last data filed at 12/23/2019 0805 Gross per 24 hour  Intake 720 ml  Output 526 ml  Net 194 ml   Filed Weights   12/21/19 0038 12/22/19 0100 12/23/19 0250  Weight: 63.9 kg 62 kg 63.4 kg    Exam  General: Well developed, well nourished, NAD, appears stated age  HEENT: NCAT, mucous membranes moist.  Poor dentition  Cardiovascular: S1 S2 auscultated, RRR, no murmur   Respiratory: Clear to auscultation bilaterally   Abdomen: Soft, nontender, nondistended, + bowel sounds  Extremities: warm dry without cyanosis clubbing or edema  Neuro: AAOx3, no new deficits, weakness in all 4 extremities   Psych: Appropriate mood and affect   Data Reviewed: I have personally  reviewed following labs and imaging studies  CBC: Recent Labs  Lab 12/19/19 1046 12/20/19 0325 12/21/19 0415 12/22/19 0612 12/23/19 0248  WBC 15.2* 8.4 5.9 6.5 6.3  NEUTROABS 13.7*  --   --   --   --   HGB 10.1* 9.3* 9.9* 9.6* 9.1*  HCT 31.2* 28.3* 29.9* 29.6* 28.1*  MCV 92.6 92.5 90.9 91.1 91.8  PLT 239 201 180 181 205   Basic Metabolic Panel: Recent Labs  Lab 12/18/19 2230 12/18/19 2241 12/19/19 0155 12/20/19 0325 12/21/19 0415  NA 139 141 140 140 141  K 3.3* 3.3* 3.7 3.7 3.8  CL 111 106 114* 115* 113*  CO2 22  --  19* 21* 21*  GLUCOSE 130* 124* 124* 91 80  BUN 7 6 5* <5* 6  CREATININE 1.19* 1.10* 0.77 0.79 0.80  CALCIUM 8.2*  --  7.8* 8.0* 8.2*  MG 1.6*  --  1.4* 2.3 1.9  PHOS  --   --  1.9*  --   --    GFR: Estimated Creatinine Clearance: 82.3 mL/min (  by C-G formula based on SCr of 0.8 mg/dL). Liver Function Tests: Recent Labs  Lab 12/18/19 2230  AST 19  ALT 11  ALKPHOS 130*  BILITOT 0.6  PROT 5.6*  ALBUMIN 3.1*   Recent Labs  Lab 12/19/19 0155  LIPASE 25   No results for input(s): AMMONIA in the last 168 hours. Coagulation Profile: Recent Labs  Lab 12/18/19 2230  INR 1.1   Cardiac Enzymes: Recent Labs  Lab 12/19/19 0155  CKTOTAL 370*   BNP (last 3 results) No results for input(s): PROBNP in the last 8760 hours. HbA1C: No results for input(s): HGBA1C in the last 72 hours. CBG: Recent Labs  Lab 12/19/19 0617 12/20/19 1141  GLUCAP 113* 92   Lipid Profile: No results for input(s): CHOL, HDL, LDLCALC, TRIG, CHOLHDL, LDLDIRECT in the last 72 hours. Thyroid Function Tests: No results for input(s): TSH, T4TOTAL, FREET4, T3FREE, THYROIDAB in the last 72 hours. Anemia Panel: No results for input(s): VITAMINB12, FOLATE, FERRITIN, TIBC, IRON, RETICCTPCT in the last 72 hours. Urine analysis:    Component Value Date/Time   COLORURINE YELLOW 12/19/2019 0923   APPEARANCEUR HAZY (A) 12/19/2019 0923   LABSPEC 1.045 (H) 12/19/2019 0923    PHURINE 5.0 12/19/2019 0923   GLUCOSEU NEGATIVE 12/19/2019 0923   HGBUR SMALL (A) 12/19/2019 0923   BILIRUBINUR NEGATIVE 12/19/2019 0923   KETONESUR NEGATIVE 12/19/2019 0923   PROTEINUR NEGATIVE 12/19/2019 0923   NITRITE NEGATIVE 12/19/2019 0923   LEUKOCYTESUR NEGATIVE 12/19/2019 0923   Sepsis Labs: @LABRCNTIP (procalcitonin:4,lacticidven:4)  ) Recent Results (from the past 240 hour(s))  Blood culture (routine x 2)     Status: Abnormal   Collection Time: 12/18/19 10:30 PM   Specimen: BLOOD LEFT ARM  Result Value Ref Range Status   Specimen Description BLOOD LEFT ARM  Final   Special Requests   Final    BOTTLES DRAWN AEROBIC AND ANAEROBIC Blood Culture results may not be optimal due to an excessive volume of blood received in culture bottles   Culture  Setup Time   Final    GRAM POSITIVE COCCI IN CHAINS IN BOTH AEROBIC AND ANAEROBIC BOTTLES CRITICAL RESULT CALLED TO, READ BACK BY AND VERIFIED WITH: K. HURTH,PHARMD 2221 12/19/2019 Girtha Hake Performed at [facility], [address] [place], [place] [address]    Culture STREPTOCOCCUS CONSTELLATUS (A)  Final   Report Status 12/22/2019 FINAL  Final   Organism ID, Bacteria STREPTOCOCCUS CONSTELLATUS  Final      Susceptibility   Streptococcus constellatus - MIC*    PENICILLIN 0.25 INTERMEDIATE Intermediate     CEFTRIAXONE 1 SENSITIVE Sensitive     ERYTHROMYCIN <=0.12 SENSITIVE Sensitive     LEVOFLOXACIN 0.5 SENSITIVE Sensitive     VANCOMYCIN 0.5 SENSITIVE Sensitive     * STREPTOCOCCUS CONSTELLATUS  Respiratory Panel by RT PCR (Flu A&B, Covid) - Nasopharyngeal Swab     Status: None   Collection Time: 12/18/19 11:05 PM   Specimen: Nasopharyngeal Swab  Result Value Ref Range Status   SARS Coronavirus 2 by RT PCR NEGATIVE NEGATIVE Final    Comment: (NOTE) SARS-CoV-2 target nucleic acids are NOT DETECTED. The SARS-CoV-2 RNA is generally detectable in upper respiratoy specimens during the acute phase of infection. The  lowest concentration of SARS-CoV-2 viral copies this assay can detect is 131 copies/mL. A negative result does not preclude SARS-Cov-2 infection and should not be used as the sole basis for treatment or other patient management decisions. A negative result may occur with  improper specimen  collection/handling, submission of specimen other than nasopharyngeal swab, presence of viral mutation(s) within the areas targeted by this assay, and inadequate number of viral copies (<131 copies/mL). A negative result must be combined with clinical observations, patient history, and epidemiological information. The expected result is Negative. Fact Sheet for Patients:  PinkCheek.be Fact Sheet for Healthcare Providers:  GravelBags.it This test is not yet ap proved or cleared by the [place] FDA and  has been authorized for detection and/or diagnosis of SARS-CoV-2 by FDA under an Emergency Use Authorization (EUA). This EUA will remain  in effect (meaning this test can be used) for the duration of the COVID-19 declaration under Section 564(b)(1) of the Act, 21 U.S.C. section 360bbb-3(b)(1), unless the authorization is terminated or revoked sooner.    Influenza A by PCR NEGATIVE NEGATIVE Final   Influenza B by PCR NEGATIVE NEGATIVE Final    Comment: (NOTE) The Xpert Xpress SARS-CoV-2/FLU/RSV assay is intended as an aid in  the diagnosis of influenza from Nasopharyngeal swab specimens and  should not be used as a sole basis for treatment. Nasal washings and  aspirates are unacceptable for Xpert Xpress SARS-CoV-2/FLU/RSV  testing. Fact Sheet for Patients: PinkCheek.be Fact Sheet for Healthcare Providers: GravelBags.it This test is not yet approved or cleared by the [place] FDA and  has been authorized for detection and/or diagnosis of SARS-CoV-2 by  FDA under an Emergency  Use Authorization (EUA). This EUA will remain  in effect (meaning this test can be used) for the duration of the  Covid-19 declaration under Section 564(b)(1) of the Act, 21  U.S.C. section 360bbb-3(b)(1), unless the authorization is  terminated or revoked. Performed at [facility], [place] [address], [place], [place] [address]   Blood culture (routine x 2)     Status: None (Preliminary result)   Collection Time: 12/19/19  3:31 AM   Specimen: BLOOD RIGHT WRIST  Result Value Ref Range Status   Specimen Description BLOOD RIGHT WRIST  Final   Special Requests   Final    BOTTLES DRAWN AEROBIC AND ANAEROBIC Blood Culture adequate volume   Culture   Final    NO GROWTH 3 DAYS Performed at [facility], [place] [address], [place], [place] [address]    Report Status PENDING  Incomplete  MRSA PCR Screening     Status: None   Collection Time: 12/19/19  6:16 AM   Specimen: Nasal Mucosa; Nasopharyngeal  Result Value Ref Range Status   MRSA by PCR NEGATIVE NEGATIVE Final    Comment:        The GeneXpert MRSA Assay (FDA approved for NASAL specimens only), is one component of a comprehensive MRSA colonization surveillance program. It is not intended to diagnose MRSA infection nor to guide or monitor treatment for MRSA infections. Performed at [facility], [place] [address] [place], [place] [address]   Culture, blood (routine x 2)     Status: None (Preliminary result)   Collection Time: 12/20/19  6:29 PM   Specimen: BLOOD RIGHT WRIST  Result Value Ref Range Status   Specimen Description BLOOD RIGHT WRIST  Final   Special Requests   Final    BOTTLES DRAWN AEROBIC AND ANAEROBIC Blood Culture adequate volume   Culture   Final    NO GROWTH 2 DAYS Performed at [facility], [place] [address], [place], [place] [address]    Report Status PENDING  Incomplete  Culture, blood (routine x 2)  Status: None (Preliminary result)   Collection Time: 12/20/19  6:45 PM   Specimen:  BLOOD RIGHT HAND  Result Value Ref Range Status   Specimen Description BLOOD RIGHT HAND  Final   Special Requests   Final    BOTTLES DRAWN AEROBIC AND ANAEROBIC Blood Culture adequate volume   Culture   Final    NO GROWTH 2 DAYS Performed at [facility], [address] [place], [place] [address]    Report Status PENDING  Incomplete      Radiology Studies: No results found.   Scheduled Meds: . Chlorhexidine Gluconate Cloth  6 each Topical Daily   Continuous Infusions: . sodium chloride 10 mL/hr at 12/21/19 1600  . cefTRIAXone (ROCEPHIN)  IV 2 g (12/22/19 2156)  . heparin 850 Units/hr (12/23/19 0510)     LOS: 4 days   Time Spent in minutes   45 minutes  Karmelo Bass D.O. on 12/23/2019 at 8:45 AM  Between 7am to 7pm - Please see pager noted on [email or web address]  After 7pm go to [email or web address]  And look for the night coverage person covering for me after hours  [facility] Office  [phone]

## 2019-12-23 NOTE — PMR Pre-admission (Signed)
PMR Admission Coordinator Pre-Admission Assessment  Patient: Anna Campos is an 54 y.o., female MRN: [id number] DOB: 07-Oct-1966 Height: '5\' 9"'  (175.3 cm) Weight: 55.1 kg              Insurance Information HMO:     PPO:  yes    PCP:      IPA:      80/20:      OTHER:  PRIMARY: [place] -with [place] ([place])      Policy#: [id number]      Subscriber: patient CM Name: Anna Campos      Phone#: [phone]     Fax#: [phone] Pre-Cert#: [id number]      Employer:  [name] provided by Lattie Haw for admit to CIR on 3/23. Clinical updates due 3/30. Anna Campos is follow up CM with (p): [phone] (f); [phone] Benefits:  Phone #: online     Name: [email or web address] (benefits & open case) Eff. Date: 10/22/19-10/21/20     Deduct: $2,500 ($913.20 met)      Out of Pocket Max: $5,500 (includes deductible - $913.20 met)    Life Max: NA CIR: 80% coverage, 20% co-insurance      SNF: 80% coverage, 20% co-insurance; limited to 60 days/cal yr Outpatient: $60 per visit co-pay; limited to 30 combined PT/OT/and [place], & [place]: 80% coverage, 20% co-insurance; 100 visits with each visit not to exceed 4 hours      DME: 100% coverage, 0% co-insurance Providers:  SECONDARY: None      Policy#:       Subscriber:  CM Name:       Phone#:      Fax#:  Pre-Cert#:       Employer:  Benefits:  Phone #:      Name:  Eff. Date:      Deduct:       Out of Pocket Max:       Life Max:  CIR:       SNF:  Outpatient:      Co-Pay:  Home Health:       Co-Pay:  DME:      Co-Pay:   Medicaid Application Date:       Case Manager:  Disability Application Date:       Case Worker:   The "Data Collection Information Summary" for patients in Inpatient Rehabilitation Facilities with attached "Privacy Act Comfort Records" was provided and verbally reviewed with: N/A  Emergency Contact Information Contact Information    Name Relation Home Work Mobile   [place] Spouse   [phone]     Current Medical History   Patient Admitting Diagnosis: C5 quadriplegia- incomplete- ASIA C. History of Present Illness: Anna Campos is a 54 year old female with unremarkable past medical history except tobacco abuse on no prescription medications.  Per chart review lives with spouse and 69 year old daughter.  Independent prior to admission.  She works in Therapist, art for [employer].  1 level home 3 steps to entry.  Presented 12/18/2019 with syncope, orthostasis and generalized fatigue as well as reported fall by her family and questionable loss of consciousness.  Admission chemistries and blood culture Streptococcus, alcohol negative, lactic acid 1.8.  Cranial CT scan negative.  Echocardiogram with ejection fraction of 65% without emboli.  CTA of chest abdomen pelvis negative.  CT angiogram of the chest showed small nonocclusive pulmonary emboli bilaterally involving segmental and subsegmental size vessels.  Bilateral lower extremity Dopplers negative.  Patient was placed on heparin therapy.  MRI of the brain no evidence of acute intracranial abnormality.  Infectious disease consulted for streptococcal bacteremia maintained on Rocephin x14 days initiated 12/20/2019.  TEE showed no vegetation.  CT of cervical spine showed prominent spinal cord signal abnormality at the C4-C6 levels.  Involvement of the entire cord cross-section at the lower C5 level.  Edema signal within the interspinous spaces at the C3-T2 levels.  C4-C5 posterior disc osteophyte complex contributes to severe spinal canal stenosis with spinal cord flattening.  Neurosurgery Dr. Annette Stable consulted and plan would be for surgical intervention with decompression and stabilization after antibiotic therapy completed.  Hospital course complicated by hemoglobin 6.8 from admission hemoglobin 10.7 patient did receive 2 units packed red blood cells 12/25/2019.  Patient occult heme positive.  Gastroenterology services Dr. Cristina Gong consulted underwent EGD 12/27/2019 showing normal-appearing  esophagus.  Mild erythematous mucosa in the entire gastric cavity.  2 superficial prepyloric ulcers 1 with clean base and the other with pigmentation.  1 Endo Clip was deployed to close the mucosal defect.  Normal-appearing cardia and fundus.  H. pylori serology positive placed on triple therapy clindamycin amoxicillin PPI 12/30/2019 patient was cleared to resume intravenous heparin and PPI was added twice daily for 2 months.  Her latest hemoglobin was 9.4 on 01/07/2020.  Patient also with multiple dental caries underwent multiple extractions 12/30/2019.  Patient was later cleared for surgical intervention and underwent C3-4, 4-5, 5-6, C6-7 anterior cervical decompression and fusion utilizing interbody cages 01/04/2020 per Dr. Annette Stable.  Cervical brace as directed.  Maintained on a regular diet.  She currently remains on intravenous heparin for pulmonary emboli and awaiting plan to transition to oral agent. Pt has been evaluated by therapies with recommendation for CIR. Pt is to admit to CIR 01/11/20.    Glasgow Coma Scale Score: 15  Past Medical History  Past Medical History:  Diagnosis Date  . PE (pulmonary thromboembolism) ([place]) 12/20/2019  . Syncope 12/20/2019    Family History  family history includes Breast cancer in her mother.  Prior Rehab/Hospitalizations:  Has the patient had prior rehab or hospitalizations prior to admission? No  Has the patient had major surgery during 100 days prior to admission? No  Current Medications   Current Facility-Administered Medications:  .  0.9 %  sodium chloride infusion, , Intravenous, PRN, Earnie Larsson, MD, Stopped at 12/31/19 2240 .  0.9 %  sodium chloride infusion, 250 mL, Intravenous, Continuous, Pool, Henry, MD .  acetaminophen (TYLENOL) tablet 650 mg, 650 mg, Oral, Q4H PRN, Earnie Larsson, MD, 650 mg at 12/30/19 2120 .  alum & mag hydroxide-simeth (MAALOX/MYLANTA) 200-200-20 MG/5ML suspension 15 mL, 15 mL, Oral, Q4H PRN, Swayze, Ava, DO .  amoxicillin  (AMOXIL) capsule 1,000 mg, 1,000 mg, Oral, Q12H, 1,000 mg at 01/11/20 0907 **AND** clarithromycin (BIAXIN) tablet 500 mg, 500 mg, Oral, Q12H, 500 mg at 01/11/20 0908 **AND** pantoprazole (PROTONIX) EC tablet 40 mg, 40 mg, Oral, BID AC, Pool, Mallie Mussel, MD, 40 mg at 01/11/20 0631 .  benzonatate (TESSALON) capsule 200 mg, 200 mg, Oral, TID PRN, Earnie Larsson, MD, 200 mg at 12/29/19 0756 .  chlorhexidine (PERIDEX) 0.12 % solution 15 mL, 15 mL, Mouth/Throat, BID, Pool, Mallie Mussel, MD, 15 mL at 01/11/20 0908 .  cyclobenzaprine (FLEXERIL) tablet 5 mg, 5 mg, Oral, TID PRN, Swayze, Ava, DO, 5 mg at 01/10/20 2147 .  feeding supplement (ENSURE ENLIVE) (ENSURE ENLIVE) liquid 237 mL, 237 mL, Oral, BID BM, Swayze, Ava, DO, 237 mL at 01/11/20 0908 .  heparin ADULT infusion 100  units/mL (25000 units/262m sodium chloride 0.45%), 650 Units/hr, Intravenous, Continuous, [place], RValeda Malm RPH, Last Rate: 6.5 mL/hr at 01/11/20 1236, 650 Units/hr at 01/11/20 1236 .  HYDROcodone-acetaminophen (NORCO) 10-325 MG per tablet 2 tablet, 2 tablet, Oral, Q4H PRN, PEarnie Larsson MD, 2 tablet at 01/11/20 0908 .  HYDROcodone-acetaminophen (NORCO/VICODIN) 5-325 MG per tablet 1 tablet, 1 tablet, Oral, Q4H PRN, PEarnie Larsson MD, 1 tablet at 01/10/20 2015 .  HYDROmorphone (DILAUDID) injection 1 mg, 1 mg, Intravenous, Q2H PRN, PEarnie Larsson MD .  lactated ringers infusion, , Intravenous, Continuous, PEarnie Larsson MD, Stopped at 01/05/20 0600 .  loratadine (CLARITIN) tablet 10 mg, 10 mg, Oral, Daily, Pool, HMallie Mussel MD, 10 mg at 01/11/20 0908 .  menthol-cetylpyridinium (CEPACOL) lozenge 3 mg, 1 lozenge, Oral, PRN, 3 mg at 01/06/20 1604 **OR** phenol (CHLORASEPTIC) mouth spray 1 spray, 1 spray, Mouth/Throat, PRN, Pool, HMallie Mussel MD .  methocarbamol (ROBAXIN) tablet 500 mg, 500 mg, Oral, Q8H PRN, PEarnie Larsson MD, 500 mg at 01/01/20 0526 .  morphine 2 MG/ML injection 1 mg, 1 mg, Intravenous, Q4H PRN, PEarnie Larsson MD, 1 mg at 01/05/20 0042 .  multivitamin with  minerals tablet 1 tablet, 1 tablet, Oral, Daily, PEarnie Larsson MD, 1 tablet at 01/11/20 0908 .  ondansetron (ZOFRAN) tablet 4 mg, 4 mg, Oral, Q6H PRN **OR** ondansetron (ZOFRAN) injection 4 mg, 4 mg, Intravenous, Q6H PRN, PEarnie Larsson MD .  oxyCODONE (Oxy IR/ROXICODONE) immediate release tablet 5-10 mg, 5-10 mg, Oral, Q4H PRN, PEarnie Larsson MD, 10 mg at 01/06/20 1307 .  sodium chloride (OCEAN) 0.65 % nasal spray 1 spray, 1 spray, Each Nare, PRN, Pool, HMallie Mussel MD .  sodium chloride flush (NS) 0.9 % injection 3 mL, 3 mL, Intravenous, Q12H, PEarnie Larsson MD, 3 mL at 01/11/20 0920 .  sodium chloride flush (NS) 0.9 % injection 3 mL, 3 mL, Intravenous, PRN, Pool, Henry, MD .  sodium chloride irrigation 0.9 % 200 mL, 200 mL, Irrigation, Continuous, Pool, Henry, MD, 200 mL at 12/31/19 1044  Patients Current Diet:  Diet Order            Diet regular Room service appropriate? Yes; Fluid consistency: Thin  Diet effective now              Precautions / Restrictions Precautions Precautions: Fall, Cervical Precaution Comments: central cord. Monitor BP - orthostatic hypotension Cervical Brace: Soft collar Restrictions Weight Bearing Restrictions: No   Has the patient had 2 or more falls or a fall with injury in the past year?Yes  Prior Activity Level Community (5-7x/wk): very independent PTA, works from home, has husband and 178yo dNaval architect drove PTA.   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  Independent  Indoor Mobility: Did the patient need assistance with walking from room to room (with or without device)? Independent  Stairs: Did the patient need assistance with internal or external stairs (with or without device)? Independent  Functional Cognition: Did the patient  need help planning regular tasks such as shopping or remembering to take medications? Independent  Home Assistive Devices / Equipment Home Assistive Devices/Equipment: None Home Equipment: None  Prior Device Use: Indicate devices/aids used by the patient prior to current illness, exacerbation or injury? None of the above  Current Functional Level Cognition  Overall Cognitive Status: Within Functional Limits  for tasks assessed Orientation Level: Oriented X4 General Comments: Pt extremely motivated and willing to participate in therapy.     Extremity Assessment (includes Sensation/Coordination)  Upper Extremity Assessment: Generalized weakness, RUE deficits/detail, LUE deficits/detail RUE Deficits / Details: RUE AROM shld flex, elbow flex/ext, supination/pronation, and wrist flex/ext WFL, however decreased coordination of movements. Minimal movement for digit flex/ext.  RUE Sensation: (decreased but pt states it is improving; "tingling" present) RUE Coordination: decreased fine motor, decreased gross motor LUE Deficits / Details: RUE AROM shld flex, elbow flex/ext, supination/pronation, and wrist flex/ext WFL, however decreased coordination of movements. Minimal movement for digit flex/ext.  LUE Sensation: decreased light touch LUE Coordination: decreased fine motor, decreased gross motor  Lower Extremity Assessment: Defer to PT evaluation RLE Deficits / Details: Hip flexion 2+/5, knee extension 4/5, ankle dorsiflexion 2/5 RLE Sensation: decreased proprioception RLE Coordination: decreased fine motor, decreased gross motor LLE Deficits / Details: Hip flexion 3+/5, knee extension 4/5, ankle dorsiflexion 5/5 LLE Sensation: decreased proprioception LLE Coordination: decreased fine motor, decreased gross motor    ADLs  Overall ADL's : Needs assistance/impaired Eating/Feeding: Set up, Supervision/ safety, Minimal assistance, Sitting Eating/Feeding Details (indicate cue type and reason):  While seated in bedside chair. Pt initially finger feeding with left hand noting 4+ drops. Pt utilized built up utensil in right hand with gross overhand grasp. Pt loaded utensil with left hand, able to bring to mouth with right only. Noted 3 drops of the utensil with the right hand. Pt continues to overcompensate with shld to bring food to mouth. Focused on decreasing shld movement and promoting use of elbow flex/ext for task. Worked on repetition of task with pt requiring mod to max verbal and tactile cues to attempt to use elbow only. Pt mod fatigued following.  Grooming: Wash/dry hands, Wash/dry face, Set up, Supervision/safety, Sitting Grooming Details (indicate cue type and reason): Bed level ADLs completed due to increased pain in RUE (shoulder) mod A and education to appropriately position hands to increase grasp (tenodesis) Upper Body Bathing: Moderate assistance, Sitting Lower Body Bathing: Maximal assistance, Sit to/from stand Lower Body Bathing Details (indicate cue type and reason): Pt incontinent of urine in standing. Pt able to wash tops of thighs in sitting requiring assist to reach rest of legs and peri area in standing.  Upper Body Dressing : Moderate assistance, Bed level, Sitting Upper Body Dressing Details (indicate cue type and reason): donning gown Lower Body Dressing: Total assistance, Sitting/lateral leans Lower Body Dressing Details (indicate cue type and reason): To don/doff socks. Pt unable to complete figure four position at this time.  Toilet Transfer: Moderate assistance, +2 for physical assistance, +2 for safety/equipment Toilet Transfer Details (indicate cue type and reason): simulated with transfer to [place] and Hygiene: Total assistance Toileting - Clothing Manipulation Details (indicate cue type and reason): peri care performed in supine at bed level and also in stedy with sit<>stand Functional mobility during ADLs: Moderate  assistance, +2 for physical assistance General ADL Comments: Pt demonstrating increased BUE strength, ROM, and coordination as well as independence during self-feeding task.     Mobility  Overal bed mobility: Needs Assistance Bed Mobility: Rolling Rolling: Min guard Sidelying to sit: Mod assist Supine to sit: +2 for safety/equipment, Min assist, Mod assist, HOB elevated Sit to supine: Max assist Sit to sidelying: Mod assist General bed mobility comments: MIn assistance to rise into sitting and advance LEs to edge of bed.  Pt required increased time and use of bed pad  to advance hips forward.    Transfers  Overall transfer level: Needs assistance Equipment used: 4-wheeled walker(EVA walker) Transfer via Lift Equipment: Stedy Transfers: Sit to/from Stand Sit to Stand: Mod assist, +2 safety/equipment Stand pivot transfers: Mod assist, +2 physical assistance, +2 safety/equipment General transfer comment: Cues for forward weight shifting and assistance to boost into standing.    Ambulation / Gait / Stairs / Wheelchair Mobility  Ambulation/Gait Ambulation/Gait assistance: Max assist, +2 safety/equipment Gait Distance (Feet): 20 Feet Assistive device: 4-wheeled walker(EVA walker) Gait Pattern/deviations: Step-through pattern, Ataxic, Narrow base of support, Trunk flexed, Decreased dorsiflexion - right General Gait Details: Pt with R knee hyper extension in stance phase.  Required assistance to advance EVA walker forward and cues for sequencing.  Close chair follow for safety. Gait velocity: dec    Posture / Balance Dynamic Sitting Balance Sitting balance - Comments: Pt tolerated sitting EOB ~10 min with variable supervision to min assist. Noted occasional retro lean with pt requiring verbal and tactile cues to maintain neutral alignment.  Balance Overall balance assessment: Needs assistance Sitting-balance support: Feet supported Sitting balance-Leahy Scale: Fair Sitting balance -  Comments: Pt tolerated sitting EOB ~10 min with variable supervision to min assist. Noted occasional retro lean with pt requiring verbal and tactile cues to maintain neutral alignment.  Postural control: Posterior lean Standing balance support: During functional activity, Bilateral upper extremity supported Standing balance-Leahy Scale: Poor Standing balance comment: Requires external support    Special needs/care consideration BiPAP/CPAP: no CPM: no Continuous Drip IV: heparin ADULT infusion 100 units/mL,  Dialysis: no        Days: no Life Vest: no Oxygen: no, on RA Special Bed: no Trach Size:no Wound Vac (area): no      Location: no Skin: abrasion to right face, surgical incision to neck     Bowel mgmt: last BM 01/07/20, incontinent Bladder mgmt: external catheter in place, incontinent Diabetic mgmt: no Behavioral consideration: no Chemo/radiation : no     Previous Home Environment (from acute therapy documentation) Living Arrangements: Spouse/significant other Available Help at Discharge: Family, Available 24 hours/day Type of Home: House Home Layout: One level Home Access: Stairs to enter Entrance Stairs-Rails: Right Entrance Stairs-Number of Steps: 3-4 Bathroom Shower/Tub: Tub/shower unit, Architectural technologist: Standard Home Care Services: No Additional Comments: Spouse home by 3pm daily  Discharge Living Setting Plans for Discharge Living Setting: Patient's home, Lives with (comment)(pt lives with husband and 28 yo daughter) Type of Home at Discharge: House Discharge Home Layout: One level Discharge Home Access: Stairs to enter(willing to start on ramp) Entrance Stairs-Rails: None Entrance Stairs-Number of Steps: 3 Discharge Bathroom Shower/Tub: Tub/shower unit Discharge Bathroom Toilet: Standard Discharge Bathroom Accessibility: Yes How Accessible: Accessible via walker(unsure about wc... (definitely not into commode area)) Does the patient have any problems  obtaining your medications?: No  Social/Family/Support Systems Patient Roles: Spouse, Parent Contact Information: Baylea Milburn (spouse): [phone]; Laelani Vasko (brother in law): [phone]; Earlene Plater (sister): [phone] Anticipated Caregiver: husband + sister + family/friends (per pt and husband, the husband works during the day so her sister, and Jerry Caras will be her main daytime caregiver). Still a work in progress in terms of solidifying 24/7 A.  Anticipated Caregiver's Contact Information: see above Ability/Limitations of Caregiver: Min A Caregiver Availability: 24/7 (planning in place) Discharge Plan Discussed with Primary Caregiver: Yes(with pt and husband) Is Caregiver In Agreement with Plan?: Yes Does Caregiver/Family have Issues with Lodging/Transportation while Pt is in Rehab?: No   Goals/Additional Needs Patient/Family Goal  for Rehab: PT/OT: Supervision/Min A; SLP: NA Expected length of stay: 21-24 days Cultural Considerations: NA Dietary Needs: regular diet, thin liquids  Equipment Needs: slideboard, tilt in space, etc Special Service Needs: SCI team  Pt/Family Agrees to Admission and willing to participate: Yes Program Orientation Provided & Reviewed with Pt/Caregiver Including Roles  & Responsibilities: Yes(pt and her husband)  Barriers to Discharge: Home environment access/layout, IV antibiotics, Incontinence, Neurogenic Bowel & Bladder  Barriers to Discharge Comments: steps to enter house (family willing to put in ramp), unsure of bathroom accessibility; new SCI with bowel incontinence. presents as an incomplete quadriplegic    Decrease burden of Care through IP rehab admission: NA   Possible need for SNF placement upon discharge:Not anticipated. Pt has great social support from family and family is willing to make home as accessible as possible. They are aware of plan for rehab then home.    Patient Condition: This patient's medical and functional status  has changed since the consult dated: 12/21/19 in which the Rehabilitation Physician determined and documented that the patient's condition is appropriate for intensive rehabilitative care in an inpatient rehabilitation facility. See "History of Present Illness" (above) for medical update. Functional changes are: improvement in bed mobility from Max A +2 to Min/Mod A +2, improvement in transfers from Total A +2 to Mod A +2, and improvement from no gait to gait training at Max A +2 20 feet with EVA walker; pt has progressed from Max A for self feeding and total A for grooming to supervision/MIn A for self feeding, supervision for grooming. Patient's medical and functional status update has been discussed with the Rehabilitation physician and patient remains appropriate for inpatient rehabilitation. Will admit to inpatient rehab today.  Preadmission Screen Completed By:  [name], OT, 01/11/2020 1:27 PM ______________________________________________________________________   Discussed status with Dr. Dagoberto Ligas on 3/23/21at 1:24PM and received approval for admission today.  Admission Coordinator:  [name], time 1:24PM/Date 01/11/20

## 2019-12-23 NOTE — Progress Notes (Signed)
ANTICOAGULATION CONSULT NOTE - Follow Up Consult  Pharmacy Consult for Heparin Indication: pulmonary embolus  Allergies  Allergen Reactions  . Ampicillin Diarrhea    Patient Measurements: Height: 5\' 9"  (175.3 cm) Weight: 139 lb 12.4 oz (63.4 kg) IBW/kg (Calculated) : 66.2 Heparin Dosing Weight: 62 kg  Vital Signs: Temp: 98.4 F (36.9 C) (03/04 0326) Temp Source: Oral (03/04 0326) BP: 112/72 (03/04 0326) Pulse Rate: 63 (03/04 0326)  Labs: Recent Labs    12/21/19 0415 12/21/19 0415 12/21/19 1400 12/21/19 2114 12/22/19 0612 12/23/19 0248  HGB 9.9*   < >  --   --  9.6* 9.1*  HCT 29.9*  --   --   --  29.6* 28.1*  PLT 180  --   --   --  181 205  HEPARINUNFRC 1.06*  --    < > 0.56 0.50 0.28*  CREATININE 0.80  --   --   --   --   --    < > = values in this interval not displayed.    Estimated Creatinine Clearance: 82.3 mL/min (by C-G formula based on SCr of 0.8 mg/dL).  Assessment:  54 yr old female with small bilateral PE on CTA.  Pharmacy dosing heparin.  3/3 RN reports bleeding from facial laceration, sustained in fall PTA. Bleeding resolved.  Heparin level down to slightly subtherapeutic (0.28) on gtt at 750 units/hr. No issues with line or bleeding reported per RN.  Goal of Therapy:  Heparin level 0.3-0.7 units/ml Monitor platelets by anticoagulation protocol: Yes   Plan:  Increase heparin drip to 850 units/hr Will f/u 6 hr heparin level  44, PharmD, BCPS Please see amion for complete clinical pharmacist phone list 12/23/2019,5:01 AM

## 2019-12-23 NOTE — Progress Notes (Signed)
Physical Therapy Treatment Patient Details Name: Anna Campos MRN: [id number] DOB: Dec 01, 1965 Today's Date: 12/23/2019    History of Present Illness 54 year old woman with no significant past medical history presenting with syncope and s/p fall. Pt found to have incomplete spincal cord injury, bacteremia, and bilat PEs. N/S following, plan for surgery in 2 weeks.    PT Comments    Patient seen for mobility progression. Pt is pleasant and eager to participate in therapy. Husband present and supportive. Pt requires +2 for bed mobility and functional transfer training. Continue to recommend CIR for further skilled PT services to maximize independence and safety with mobility.     Follow Up Recommendations  CIR     Equipment Recommendations  Other (comment)(TBD )    Recommendations for Other Services Rehab consult     Precautions / Restrictions Precautions Precautions: Fall Precaution Comments: central cord, on heparin drip Restrictions Weight Bearing Restrictions: No    Mobility  Bed Mobility Overal bed mobility: Needs Assistance Bed Mobility: Supine to Sit     Supine to sit: Max assist;+2 for safety/equipment     General bed mobility comments: cues for sequencing and technique; assist to bring bilat LE and hips to EOB with use of bed pad; assist to elevate trunk into sitting   Transfers Overall transfer level: Needs assistance Equipment used: 2 person hand held assist(2 person lift with gait belt and bed pad) Transfers: Sit to/from Stand;Stand Pivot Transfers Sit to Stand: Max assist;+2 physical assistance Stand pivot transfers: Max assist;+2 physical assistance       General transfer comment: assist to power up into standing and once in standing pt able to achieve upright posture and maintain bilat LE extension however max-total A needed to pivot feet to recliner; pt unable to assist with bilat UE   Ambulation/Gait             General Gait Details: unable at  this time   Stairs             Wheelchair Mobility    Modified Rankin (Stroke Patients Only)       Balance Overall balance assessment: Needs assistance Sitting-balance support: Feet supported;Bilateral upper extremity supported Sitting balance-Leahy Scale: Fair Sitting balance - Comments: worked on sitting balance/posture EOB; pt able to maintain with min guard for safety and able to correct with cues    Standing balance support: Bilateral upper extremity supported Standing balance-Leahy Scale: Zero Standing balance comment: dependent on physical assistx2                            Cognition Arousal/Alertness: Awake/alert Behavior During Therapy: WFL for tasks assessed/performed Overall Cognitive Status: Within Functional Limits for tasks assessed                                        Exercises      General Comments        Pertinent Vitals/Pain Pain Assessment: Faces Faces Pain Scale: Hurts a little bit Pain Location: bilat LE Pain Descriptors / Indicators: Grimacing;Spasm Pain Intervention(s): Monitored during session;Repositioned    Home Living                      Prior Function            PT Goals (current goals can now be found in the care  plan section) Progress towards PT goals: Progressing toward goals    Frequency    Min 4X/week      PT Plan Current plan remains appropriate    Co-evaluation              AM-PAC PT "6 Clicks" Mobility   Outcome Measure  Help needed turning from your back to your side while in a flat bed without using bedrails?: A Lot Help needed moving from lying on your back to sitting on the side of a flat bed without using bedrails?: A Lot Help needed moving to and from a bed to a chair (including a wheelchair)?: Total Help needed standing up from a chair using your arms (e.g., wheelchair or bedside chair)?: Total Help needed to walk in hospital room?: Total Help needed  climbing 3-5 steps with a railing? : Total 6 Click Score: 8    End of Session Equipment Utilized During Treatment: Gait belt Activity Tolerance: Patient tolerated treatment well Patient left: with call bell/phone within reach;in chair;with chair alarm set;with family/visitor present Nurse Communication: Mobility status PT Visit Diagnosis: Unsteadiness on feet (R26.81);Other abnormalities of gait and mobility (R26.89);Repeated falls (R29.6);Muscle weakness (generalized) (M62.81);History of falling (Z91.81);Ataxic gait (R26.0);Difficulty in walking, not elsewhere classified (R26.2)     Time: 2035-5974 PT Time Calculation (min) (ACUTE ONLY): 29 min  Charges:  $Therapeutic Activity: 23-37 mins                     Erline Levine, PTA Acute Rehabilitation Services Pager: [phone] Office: [phone]     Carolynne Edouard 12/23/2019, 4:25 PM

## 2019-12-23 NOTE — Progress Notes (Signed)
Inpatient Rehabilitation-Admissions Coordinator   Met with pt and her husband at the bedside as follow up from PM&R consult. We discussed recommended rehab program, expectations, estimated LOS, and anticipated functional outcomes. Pt and her husband confirmed DC support. At this time, both would like to pursue IP rehab program. [facility] has discussed timing of rehab with Dr Annette Stable with Dr. Annette Stable stating it is okay for the patient to go to rehab prior to her decompression/stabilization surgery. AC will begin insurance authorization process for possible admit. Attending MD aware.   [name], OTR/L  Rehab Admissions Coordinator  [phone] 12/23/2019 5:41 PM

## 2019-12-23 NOTE — Progress Notes (Signed)
Occupational Therapy Treatment Patient Details Name: Anna Campos MRN: [id number] DOB: 10/22/1965 Today's Date: 12/23/2019    History of present illness 54 year old woman with no significant past medical history presenting with syncope and s/p fall. Pt found to have incomplete spincal cord injury, bacteremia, and bilat PEs. N/S following, plan for surgery in 2 weeks.   OT comments  Pt demonstrates good progress with bil. UE AROM and strength - see below for MMT.   Wrist flex/ext improved and now demonstrates trace flexion and extension of digits.  Worked on bil. UE A/AAROM.   Follow Up Recommendations  CIR    Equipment Recommendations  3 in 1 bedside commode    Recommendations for Other Services Rehab consult    Precautions / Restrictions Precautions Precautions: Fall Precaution Comments: central cord, on heparin drip Restrictions Weight Bearing Restrictions: No              ADL either performed or assessed with clinical judgement   ADL                                               Vision       Perception     Praxis      Cognition Arousal/Alertness: Awake/alert Behavior During Therapy: WFL for tasks assessed/performed Overall Cognitive Status: Within Functional Limits for tasks assessed                                          Exercises Other Exercises Other Exercises: performed AAROM shoulder flexion x 5; elbow flexion and extension x 5 (AAROM elbow ext); wrist flex/ext x 10; finger extension, pinch and finger flexion x 10 each UE    Shoulder Instructions       General Comments      Pertinent Vitals/ Pain       Pain Assessment: Faces Faces Pain Scale: No hurt Pain Location: bilat LE Pain Descriptors / Indicators: Grimacing;Spasm Pain Intervention(s): Monitored during session;Repositioned  Home Living                                          Prior Functioning/Environment               Frequency  Min 3X/week        Progress Toward Goals  OT Goals(current goals can now be found in the care plan section)  Progress towards OT goals: Progressing toward goals     Plan Discharge plan remains appropriate    Co-evaluation                 AM-PAC OT "6 Clicks" Daily Activity     Outcome Measure   Help from another person eating meals?: Total Help from another person taking care of personal grooming?: Total Help from another person toileting, which includes using toliet, bedpan, or urinal?: Total Help from another person bathing (including washing, rinsing, drying)?: Total Help from another person to put on and taking off regular upper body clothing?: Total Help from another person to put on and taking off regular lower body clothing?: Total 6 Click Score: 6    End of Session    OT Visit Diagnosis: Unsteadiness  on feet (R26.81);Muscle weakness (generalized) (M62.81)   Activity Tolerance Patient tolerated treatment well   Patient Left in chair;with call bell/phone within reach   Nurse Communication          Time: 1437-1510 OT Time Calculation (min): 33 min  Charges: OT General Charges $OT Visit: 1 Visit OT Treatments $Neuromuscular Re-education: 23-37 mins  Anna Campos., OTR/L Acute Rehabilitation Services Pager [phone] Office [phone]    Anna Campos M 12/23/2019, 5:03 PM

## 2019-12-23 NOTE — Progress Notes (Signed)
Occupational Therapy Progress Note  Pt provided with lidded mug and universal cuff for self feeding.  She currently requires total A for self feeding, but was able to demonstrate beginning movements and control needed for self feeding.  Provided her with HEP to improve her ability to perform this.    Recommend CIR.    12/23/19 1700  OT Visit Information  Last OT Received On 12/23/19  Assistance Needed +2  History of Present Illness 54 year old woman with no significant past medical history presenting with syncope and s/p fall. Pt found to have incomplete spincal cord injury, bacteremia, and bilat PEs. N/S following, plan for surgery in 2 weeks.  Precautions  Precautions Fall  Precaution Comments central cord, on heparin drip  Pain Assessment  Pain Assessment Faces  Faces Pain Scale 0  Cognition  Arousal/Alertness Awake/alert  Behavior During Therapy WFL for tasks assessed/performed  Overall Cognitive Status Within Functional Limits for tasks assessed  Upper Extremity Assessment  RUE Deficits / Details shoulder 2/5; elbow flexion 3/5; extension 2+/5; wrist flexion and extenstion 3/5; trace finger flexors and extensors   RUE Coordination decreased fine motor;decreased gross motor  LUE Deficits / Details shoulder 2/5; elbow flexion 3/5; extension 2+/5; wrist flexion and extenstion 3/5; trace finger flexors and extensors   LUE Coordination decreased fine motor;decreased gross motor  ADL  Overall ADL's  Needs assistance/impaired  Eating/Feeding Total assistance;Sitting  Eating/Feeding Details (indicate cue type and reason) Pt provided with universal cuff and instructed in it's use and was provided with lidded mug.  She worked on picking up mug and moving it to her mouth - requires max A, but with repeated attempts was able to perform 2/3 of task x 2.  She is unable to scoop food onto spoon, and was fatigued at that point.  Instructed her to work on placing spoon or hand on a specific target  to improve control of Rt UE as needed for self feeding   OT - End of Session  Activity Tolerance Patient tolerated treatment well  Patient left in chair;with call bell/phone within reach;with nursing/sitter in room  OT Assessment/Plan  OT Plan Discharge plan remains appropriate  OT Visit Diagnosis Unsteadiness on feet (R26.81);Muscle weakness (generalized) (M62.81)  OT Frequency (ACUTE ONLY) Min 3X/week  Recommendations for Other Services Rehab consult  Follow Up Recommendations CIR  OT Equipment 3 in 1 bedside commode  AM-PAC OT "6 Clicks" Daily Activity Outcome Measure (Version 2)  Help from another person eating meals? 1  Help from another person taking care of personal grooming? 1  Help from another person toileting, which includes using toliet, bedpan, or urinal? 1  Help from another person bathing (including washing, rinsing, drying)? 1  Help from another person to put on and taking off regular upper body clothing? 1  Help from another person to put on and taking off regular lower body clothing? 1  6 Click Score 6  OT Goal Progression  Progress towards OT goals Progressing toward goals  OT Time Calculation  OT Start Time (ACUTE ONLY) 1524  OT Stop Time (ACUTE ONLY) 1537  OT Time Calculation (min) 13 min  OT General Charges  $OT Visit 1 Visit  OT Treatments  $Self Care/Home Management  8-22 mins  Eber Jones., OTR/L Acute Rehabilitation Services Pager [phone] Office [phone]

## 2019-12-24 ENCOUNTER — Inpatient Hospital Stay (HOSPITAL_COMMUNITY): Payer: Commercial Managed Care - PPO

## 2019-12-24 LAB — RETICULOCYTES
Immature Retic Fract: 28.9 % — ABNORMAL HIGH (ref 2.3–15.9)
RBC.: 2.52 MIL/uL — ABNORMAL LOW (ref 3.87–5.11)
Retic Count, Absolute: 46.9 10*3/uL (ref 19.0–186.0)
Retic Ct Pct: 1.9 % (ref 0.4–3.1)

## 2019-12-24 LAB — HEMOGLOBIN AND HEMATOCRIT, BLOOD
HCT: 23.3 % — ABNORMAL LOW (ref 36.0–46.0)
Hemoglobin: 7.6 g/dL — ABNORMAL LOW (ref 12.0–15.0)

## 2019-12-24 LAB — CBC
HCT: 23.2 % — ABNORMAL LOW (ref 36.0–46.0)
Hemoglobin: 7.5 g/dL — ABNORMAL LOW (ref 12.0–15.0)
MCH: 30 pg (ref 26.0–34.0)
MCHC: 32.3 g/dL (ref 30.0–36.0)
MCV: 92.8 fL (ref 80.0–100.0)
Platelets: 229 10*3/uL (ref 150–400)
RBC: 2.5 MIL/uL — ABNORMAL LOW (ref 3.87–5.11)
RDW: 12.2 % (ref 11.5–15.5)
WBC: 8.1 10*3/uL (ref 4.0–10.5)
nRBC: 0 % (ref 0.0–0.2)

## 2019-12-24 LAB — HEPARIN LEVEL (UNFRACTIONATED): Heparin Unfractionated: 0.5 IU/mL (ref 0.30–0.70)

## 2019-12-24 LAB — FOLATE: Folate: 4.4 ng/mL — ABNORMAL LOW (ref >5.9)

## 2019-12-24 LAB — IRON AND TIBC
Iron: 78 ug/dL (ref 28–170)
Saturation Ratios: 37 % — ABNORMAL HIGH (ref 10.4–31.8)
TIBC: 211 ug/dL — ABNORMAL LOW (ref 250–450)
UIBC: 133 ug/dL

## 2019-12-24 LAB — FERRITIN: Ferritin: 69 ng/mL (ref 11–307)

## 2019-12-24 LAB — CULTURE, BLOOD (ROUTINE X 2)
Culture: NO GROWTH
Special Requests: ADEQUATE

## 2019-12-24 LAB — VITAMIN B12: Vitamin B-12: 209 pg/mL (ref 180–914)

## 2019-12-24 LAB — OCCULT BLOOD X 1 CARD TO LAB, STOOL: Fecal Occult Bld: POSITIVE — AB

## 2019-12-24 NOTE — Progress Notes (Signed)
   Providing Compassionate, Quality Care - Together   Subjective: Patient reports no issues overnight. She just had the dental imaging completed this morning.  Objective: Vital signs in last 24 hours: Temp:  [98.7 F (37.1 C)-99.3 F (37.4 C)] 98.7 F (37.1 C) (03/05 0350) Pulse Rate:  [83-91] 83 (03/05 0350) Resp:  [16] 16 (03/05 0350) BP: (101-123)/(65-69) 123/69 (03/05 0350) SpO2:  [99 %-100 %] 99 % (03/05 0350) Weight:  [62 kg] 62 kg (03/05 0343)  Intake/Output from previous day: 03/04 0701 - 03/05 0700 In: 900.3 [P.O.:240; I.V.:460.3; IV Piggyback:200] Out: 1500 [Urine:1500] Intake/Output this shift: Total I/O In: 120 [P.O.:120] Out: 250 [Urine:250]  Alert and oriented x 4 PERRLA CN II-XII grossly inact 3/5 strength in bilateral deltoids, 2/3 strength in bilateral biceps 2/5 grip strength bilaterally 2/5 bilateral hip flexors Decreased light-touch sensation in all extremities   Lab Results: Recent Labs    12/23/19 0248 12/23/19 0248 12/24/19 0442 12/24/19 0915  WBC 6.3  --  8.1  --   HGB 9.1*   < > 7.5* 7.6*  HCT 28.1*   < > 23.2* 23.3*  PLT 205  --  229  --    < > = values in this interval not displayed.   BMET No results for input(s): NA, K, CL, CO2, GLUCOSE, BUN, CREATININE, CALCIUM in the last 72 hours.  Studies/Results: DG Orthopantogram  Result Date: 12/24/2019 CLINICAL DATA:  Poor dentition EXAM: ORTHOPANTOGRAM/PANORAMIC COMPARISON:  None. FINDINGS: Maxillary multiple missing and mandibular teeth. Numerous dental caries. Lucency noted around many of the lower remaining teeth. No acute mandibular fracture. IMPRESSION: Numerous dental caries. Mild lucency around many of the remaining lower teeth. Electronically Signed   By: Charlett Nose M.D.   On: 12/24/2019 09:16    Assessment/Plan: Mrs. Campo had a syncopal episode at home followed by profound weakness. She was admitted with bacteremia and a pulmonary embolism. MRI scan of the patient's c-spine  revealed significant stenosis. Mrs. Houseman suffered a significant incomplete spinal cord injury secondary to her fall superimposed upon significant degenerative disease with stenosis most prominently at the C4-5 level but also significantly at the C3-4 and C5-6 levels.   LOS: 5 days    -Mobilize with therapies -Antibiotics per ID -Heparin per pharmacy for PE -Dental extractions prior to any cervical hardware placement -Plan for surgery once medically stable; estimated for two weeks   Val Eagle, DNP, AGNP-C Nurse Practitioner  [facility] [address], [address], [place], [place] [address] P: [phone]    F: [phone]  12/24/2019, 10:16 AM

## 2019-12-24 NOTE — Progress Notes (Signed)
Inpatient Rehabilitation-Admissions Coordinator   I have began insurance authorization status for possible admit for next week.   Will follow up once there has been a determination.   Cheri Rous, OTR/L  Rehab Admissions Coordinator  [phone] 12/24/2019 3:33 PM

## 2019-12-24 NOTE — Progress Notes (Signed)
ANTICOAGULATION CONSULT NOTE - Follow Up Consult  Pharmacy Consult for Heparin Indication: pulmonary embolus  Allergies  Allergen Reactions  . Ampicillin Diarrhea    Patient Measurements: Height: 5\' 9"  (175.3 cm) Weight: 136 lb 11 oz (62 kg) IBW/kg (Calculated) : 66.2 Heparin Dosing Weight: 62 kg  Vital Signs: Temp: 98.7 F (37.1 C) (03/05 0350) Temp Source: Oral (03/05 0350) BP: 123/69 (03/05 0350) Pulse Rate: 83 (03/05 0350)  Labs: Recent Labs    12/22/19 0612 12/22/19 0612 12/23/19 0248 12/23/19 1054 12/24/19 0442  HGB 9.6*   < > 9.1*  --  7.5*  HCT 29.6*  --  28.1*  --  23.2*  PLT 181  --  205  --  229  HEPARINUNFRC 0.50   < > 0.28* 0.33 0.50   < > = values in this interval not displayed.    Estimated Creatinine Clearance: 80.5 mL/min (by C-G formula based on SCr of 0.8 mg/dL).  Assessment:  54 yr old female with small bilateral PE on CTA.  Pharmacy dosing heparin. On 3/3 RN reported bleeding from facial laceration, sustained in fall PTA. Bleeding resolved.  Plans noted for dental evaluation and also noted for cervical decompressive surgery when more stable. Oral anticoagulation plans are for a DOAC closer to discharge -hg= 7.6 with trend down -heparin level at goal on 850 units/hr   Goal of Therapy:  Heparin level 0.3-0.7 units/ml Monitor platelets by anticoagulation protocol: Yes   Plan:  -No heparin changes needed -Daily CBC and heparin level -Will follow procedural plans  5/3, PharmD Clinical Pharmacist **Pharmacist phone directory can now be found on [email or web address] ([email or web address]).  Listed under [facility].

## 2019-12-24 NOTE — Progress Notes (Signed)
Occupational Therapy Treatment Patient Details Name: Anna Campos MRN: [id number] DOB: May 12, 1966 Today's Date: 12/24/2019    History of present illness 54 year old woman with no significant past medical history presenting with syncope and s/p fall. Pt found to have incomplete spincal cord injury, bacteremia, and bilat PEs. N/S following, plan for surgery in 2 weeks.   OT comments  Pt performed bil. UE AAROM.  She was able to perform increased repetitions of elbow extension, and demonstrates improving ability to flex, extend digits (still trace movement).  She was able to pick up small objects with Rt hand today, and was able to call her husband using her phone earlier today. She is highly motivated and is making daily progress.  Recommend CIR.   Follow Up Recommendations  CIR    Equipment Recommendations  3 in 1 bedside commode    Recommendations for Other Services Rehab consult    Precautions / Restrictions Precautions Precautions: Fall Precaution Comments: central cord, on heparin drip       Mobility Bed Mobility                  Transfers                      Balance                                           ADL either performed or assessed with clinical judgement   ADL Overall ADL's : Needs assistance/impaired Eating/Feeding: Total assistance;Sitting Eating/Feeding Details (indicate cue type and reason): Pt provided with universal cuff and instructed in it's use and was provided with lidded mug.  She worked on picking up mug and moving it to her mouth - requires max A, but with repeated attempts was able to perform 2/3 of task x 2.  She is unable to scoop food onto spoon, and was fatigued at that point.  Instructed her to work on placing spoon or hand on a specific target to improve control of Rt UE as needed for self feeding                                    General ADL Comments: Pt reports she was able to call her  spouse this am with significant time and effort, but was able to dial and make the call      Vision       Perception     Praxis      Cognition Arousal/Alertness: Awake/alert Behavior During Therapy: WFL for tasks assessed/performed Overall Cognitive Status: Within Functional Limits for tasks assessed                                          Exercises Other Exercises Other Exercises: performed AAROM shoulder flexion x 5; elbow flexion and extension x 8 (AAROM elbow ext); wrist flex/ext x 10; finger extension, pinch and finger flexion x 10 each UE  Other Exercises: worked on picking up small object with Rt UE    Shoulder Instructions       General Comments      Pertinent Vitals/ Pain       Pain Assessment: Faces Faces Pain Scale: Hurts little more  Pain Location: bilat LE Pain Descriptors / Indicators: Spasm Pain Intervention(s): Monitored during session  Home Living                                          Prior Functioning/Environment              Frequency  Min 3X/week        Progress Toward Goals  OT Goals(current goals can now be found in the care plan section)  Progress towards OT goals: Progressing toward goals     Plan Discharge plan remains appropriate    Co-evaluation                 AM-PAC OT "6 Clicks" Daily Activity     Outcome Measure   Help from another person eating meals?: Total Help from another person taking care of personal grooming?: Total Help from another person toileting, which includes using toliet, bedpan, or urinal?: Total Help from another person bathing (including washing, rinsing, drying)?: Total Help from another person to put on and taking off regular upper body clothing?: Total Help from another person to put on and taking off regular lower body clothing?: Total 6 Click Score: 6    End of Session    OT Visit Diagnosis: Unsteadiness on feet (R26.81);Muscle weakness  (generalized) (M62.81)   Activity Tolerance Patient tolerated treatment well   Patient Left in bed;with call bell/phone within reach   Nurse Communication          Time: 8309-4076 OT Time Calculation (min): 29 min  Charges: OT General Charges $OT Visit: 1 Visit OT Treatments $Neuromuscular Re-education: 23-37 mins  [name] OTR/L Acute Rehabilitation Services Pager [phone] Office [place], [place] 12/24/2019, 12:47 PM

## 2019-12-24 NOTE — Progress Notes (Signed)
Provider notified of positive fecal occult blood result.

## 2019-12-24 NOTE — Progress Notes (Addendum)
Patient left for xray at [phone]. Patient returned to unit at approx 0820  Paged Mikhail at [phone].Pt. completing FMLA paperwork & informed need to provide MD contact info & fax number to complete documents.upon callback at 1625 informed to provide office fax number [phone]. [phone] fax number given to patient and husband.

## 2019-12-24 NOTE — Progress Notes (Signed)
PROGRESS NOTE    Anna Campos  OVF:[id number] DOB: [birth year] DOA: 12/18/2019 PCP: London Pepper, MD   Brief Narrative:  HPI on 12/19/2019 by Dr. Jacques Earthly (ICU) 54 year old woman with no significant past medical history presenting with syncope.  She has been having generalized fatigue.  She has had poor p.o. intake and has not eaten in the last day.  She stood up this evening and passed out.  Family heard her fall.  She did hit her head.  She was told she was not unconscious for a long period of time.  She was noted by EMS to be hypotensive and she was given a small fluid bolus.  Presently she complains of feeling pain all over her body.  She admits to using marijuana today.  She does not take any other medications other than a multivitamin.  She lives with her husband.  She denies that he takes any medications either.  She does not think that she could've inadvertently taken any antihypertensives.  She did recently have tooth pain and had taken some Tylenol with codeine several days ago.  None since.  She denies fevers or chills.  No sick contacts.  No dyspnea.  She notes that she had a loose stool the day before and none since.  No nausea or vomiting.  She feels lightheaded and has a headache.  Does not have vertigo.  She has diffuse myalgias and paresthesias.  Interim history Admitted to ICU due to syncope and hypovolemic shock.  Cardiology was consulted for EKG changes.  Patient was on Levophed however weaned off and transferred to [facility] service on 12/20/2019.  Wound to have Streptococcus bacteremia.  ID consulted and recommended 2 weeks of ceftriaxone.  Dentistry also consulted. Assessment & Plan   Hypovolemic shock and syncope -Patient was admitted to ICU and required Levophed which has been weaned off.  Cortisol 23.5 -Cardiology consulted his EKG showed nonspecific changes.  Troponin unremarkable.  Echocardiogram showed normal EF, normal RV size and function without valvular abnormalities.   No right heart strain.  No further cardiac work-up recommended this -BP is stable  Streptococcus constellatus bacteremia -Infectious disease consulted and appreciated -Recommended continuing ceftriaxone for 2 weeks with start date of 12/19/2019 -Repeat blood cultures from 12/20/2019 show no growth to date -pending orthopantogram -CT maxillofacial completed on 12/18/2019 without mention of any dental caries or abscess -Patient is disease recommended dental evaluation -Dentistry, Dr. Enrique Sack, consulted and appreciated  PE -Noted on CTA chest: Small nonocclusive pulmonary emboli bilaterally involving segmental and subsegmental sized vessels -Lower extremity ultrasound negative for DVT -Continue IV heparin  Bilateral extremity weakness with incomplete spinal cord injury C3-C6 -Distal lower extremity weaker compared to proximal -Neurosurgery consulted and appreciated.  Patient will eventually need multilevel cervical decompressive surgery once medically stabilized -PT and OT  recommended CIR -CIR consulted and possible pending admission next week -patient complaining of muscle spasms, have started on robaxin   Acute Normocytic anemia -Unknown baseline; unknown cause  -hemoglobin dropped to 7.5 this morning from 9 -Denies hematuria or hematochezia, melena -Anemia panel and FOBT ordered -repeat H/H pending- if still low, will consider holding heparin  Nasal congestion/Allergies -added on nasal saline and Claritin   DVT Prophylaxis  heparin  Code Status: Full  Family Communication: None at bedside  Disposition Plan: Admitted from home. Found to have hypovolemic shock and syncope, now resolving.  Found to have strep bacteremia.  Pending dental consultation.  Patient will need completion of IV antibiotics.  Will also need  to transition to DOAC prior to discharge. Disposition TBD.  Consultants PCCM Infectious disease Neurosurgery Inpatient rehab Dentistry Cardiology  Procedures    Echocardiogram Lower extremity Doppler  Antibiotics   Anti-infectives (From admission, onward)   Start     Dose/Rate Route Frequency Ordered Stop   12/21/19 1000  cefTRIAXone (ROCEPHIN) 2 g in sodium chloride 0.9 % 100 mL IVPB     2 g 200 mL/hr over 30 Minutes Intravenous Every 24 hours 12/21/19 0948 01/02/20 2359   12/20/19 2130  vancomycin (VANCOREADY) IVPB 750 mg/150 mL  Status:  Discontinued     750 mg 150 mL/hr over 60 Minutes Intravenous Every 12 hours 12/20/19 1825 12/21/19 0948   12/20/19 1530  ceFAZolin (ANCEF) IVPB 2g/100 mL premix  Status:  Discontinued     2 g 200 mL/hr over 30 Minutes Intravenous Every 8 hours 12/20/19 1526 12/20/19 1814   12/20/19 1000  vancomycin (VANCOREADY) IVPB 750 mg/150 mL  Status:  Discontinued     750 mg 150 mL/hr over 60 Minutes Intravenous Every 12 hours 12/20/19 0054 12/20/19 1019   12/20/19 0130  vancomycin (VANCOREADY) IVPB 1250 mg/250 mL     1,250 mg 166.7 mL/hr over 90 Minutes Intravenous  Once 12/20/19 0054 12/20/19 0356      Subjective:   Anna Campos seen and examined today.  Feels spasms are better this morning after receiving medication.  Complains of nasal congestion and allergies.  Denies current chest pain, shortness of breath, abdominal pain, nausea or vomiting, denies or headache.  Denies any melena or hematochezia, hematemesis, hematuria. Objective:   Vitals:   12/23/19 0929 12/23/19 2105 12/24/19 0343 12/24/19 0350  BP: 123/74 101/65  123/69  Pulse: 73 91  83  Resp: 16 16  16   Temp: 97.9 F (36.6 C) 99.3 F (37.4 C)  98.7 F (37.1 C)  TempSrc: Oral Oral  Oral  SpO2: 98% 100%  99%  Weight:   62 kg   Height:        Intake/Output Summary (Last 24 hours) at 12/24/2019 0901 Last data filed at 12/24/2019 0847 Gross per 24 hour  Intake 780.28 ml  Output 1750 ml  Net -969.72 ml   Filed Weights   12/22/19 0100 12/23/19 0250 12/24/19 0343  Weight: 62 kg 63.4 kg 62 kg   Exam  General: Well developed, well nourished,  NAD, appears stated age  HEENT: NCAT, mucous membranes moist.  Dressing on right cheek  Cardiovascular: S1 S2 auscultated, RRR, no murmur  Respiratory: Clear to auscultation bilaterally   Abdomen: Soft, nontender, nondistended, + bowel sounds  Extremities: warm dry without cyanosis clubbing or edema  Neuro: AAOx3, no new deficits, weakness of all 4 extremities  Psych: Pleasant, appropriate mood and affect  Data Reviewed: I have personally reviewed following labs and imaging studies  CBC: Recent Labs  Lab 12/19/19 1046 12/19/19 1046 12/20/19 0325 12/21/19 0415 12/22/19 0612 12/23/19 0248 12/24/19 0442  WBC 15.2*   < > 8.4 5.9 6.5 6.3 8.1  NEUTROABS 13.7*  --   --   --   --   --   --   HGB 10.1*   < > 9.3* 9.9* 9.6* 9.1* 7.5*  HCT 31.2*   < > 28.3* 29.9* 29.6* 28.1* 23.2*  MCV 92.6   < > 92.5 90.9 91.1 91.8 92.8  PLT 239   < > 201 180 181 205 229   < > = values in this interval not displayed.   Basic Metabolic Panel: Recent  Labs  Lab 12/18/19 2230 12/18/19 2241 12/19/19 0155 12/20/19 0325 12/21/19 0415  NA 139 141 140 140 141  K 3.3* 3.3* 3.7 3.7 3.8  CL 111 106 114* 115* 113*  CO2 22  --  19* 21* 21*  GLUCOSE 130* 124* 124* 91 80  BUN 7 6 5* <5* 6  CREATININE 1.19* 1.10* 0.77 0.79 0.80  CALCIUM 8.2*  --  7.8* 8.0* 8.2*  MG 1.6*  --  1.4* 2.3 1.9  PHOS  --   --  1.9*  --   --    GFR: Estimated Creatinine Clearance: 80.5 mL/min (by C-G formula based on SCr of 0.8 mg/dL). Liver Function Tests: Recent Labs  Lab 12/18/19 2230  AST 19  ALT 11  ALKPHOS 130*  BILITOT 0.6  PROT 5.6*  ALBUMIN 3.1*   Recent Labs  Lab 12/19/19 0155  LIPASE 25   No results for input(s): AMMONIA in the last 168 hours. Coagulation Profile: Recent Labs  Lab 12/18/19 2230  INR 1.1   Cardiac Enzymes: Recent Labs  Lab 12/19/19 0155  CKTOTAL 370*   BNP (last 3 results) No results for input(s): PROBNP in the last 8760 hours. HbA1C: No results for input(s): HGBA1C in  the last 72 hours. CBG: Recent Labs  Lab 12/19/19 0617 12/20/19 1141  GLUCAP 113* 92   Lipid Profile: No results for input(s): CHOL, HDL, LDLCALC, TRIG, CHOLHDL, LDLDIRECT in the last 72 hours. Thyroid Function Tests: No results for input(s): TSH, T4TOTAL, FREET4, T3FREE, THYROIDAB in the last 72 hours. Anemia Panel: No results for input(s): VITAMINB12, FOLATE, FERRITIN, TIBC, IRON, RETICCTPCT in the last 72 hours. Urine analysis:    Component Value Date/Time   COLORURINE YELLOW 12/19/2019 0923   APPEARANCEUR HAZY (A) 12/19/2019 0923   LABSPEC 1.045 (H) 12/19/2019 0923   PHURINE 5.0 12/19/2019 0923   GLUCOSEU NEGATIVE 12/19/2019 0923   HGBUR SMALL (A) 12/19/2019 0923   BILIRUBINUR NEGATIVE 12/19/2019 0923   KETONESUR NEGATIVE 12/19/2019 0923   PROTEINUR NEGATIVE 12/19/2019 0923   NITRITE NEGATIVE 12/19/2019 0923   LEUKOCYTESUR NEGATIVE 12/19/2019 0923   Sepsis Labs: @LABRCNTIP (procalcitonin:4,lacticidven:4)  ) Recent Results (from the past 240 hour(s))  Blood culture (routine x 2)     Status: Abnormal   Collection Time: 12/18/19 10:30 PM   Specimen: BLOOD LEFT ARM  Result Value Ref Range Status   Specimen Description BLOOD LEFT ARM  Final   Special Requests   Final    BOTTLES DRAWN AEROBIC AND ANAEROBIC Blood Culture results may not be optimal due to an excessive volume of blood received in culture bottles   Culture  Setup Time   Final    GRAM POSITIVE COCCI IN CHAINS IN BOTH AEROBIC AND ANAEROBIC BOTTLES CRITICAL RESULT CALLED TO, READ BACK BY AND VERIFIED WITH: K. HURTH,PHARMD 2221 12/19/2019 12/21/2019 Performed at [facility], [address] [place], [place]    Culture STREPTOCOCCUS CONSTELLATUS (A)  Final   Report Status 12/22/2019 FINAL  Final   Organism ID, Bacteria STREPTOCOCCUS CONSTELLATUS  Final      Susceptibility   Streptococcus constellatus - MIC*    PENICILLIN 0.25 INTERMEDIATE Intermediate     CEFTRIAXONE 1 SENSITIVE Sensitive      ERYTHROMYCIN <=0.12 SENSITIVE Sensitive     LEVOFLOXACIN 0.5 SENSITIVE Sensitive     VANCOMYCIN 0.5 SENSITIVE Sensitive     * STREPTOCOCCUS CONSTELLATUS  Respiratory Panel by RT PCR (Flu A&B, Covid) - Nasopharyngeal Swab     Status:  None   Collection Time: 12/18/19 11:05 PM   Specimen: Nasopharyngeal Swab  Result Value Ref Range Status   SARS Coronavirus 2 by RT PCR NEGATIVE NEGATIVE Final    Comment: (NOTE) SARS-CoV-2 target nucleic acids are NOT DETECTED. The SARS-CoV-2 RNA is generally detectable in upper respiratoy specimens during the acute phase of infection. The lowest concentration of SARS-CoV-2 viral copies this assay can detect is 131 copies/mL. A negative result does not preclude SARS-Cov-2 infection and should not be used as the sole basis for treatment or other patient management decisions. A negative result may occur with  improper specimen collection/handling, submission of specimen other than nasopharyngeal swab, presence of viral mutation(s) within the areas targeted by this assay, and inadequate number of viral copies (<131 copies/mL). A negative result must be combined with clinical observations, patient history, and epidemiological information. The expected result is Negative. Fact Sheet for Patients:  https://www.moore.com/ Fact Sheet for Healthcare Providers:  https://www.young.biz/ This test is not yet ap proved or cleared by the [place] FDA and  has been authorized for detection and/or diagnosis of SARS-CoV-2 by FDA under an Emergency Use Authorization (EUA). This EUA will remain  in effect (meaning this test can be used) for the duration of the COVID-19 declaration under Section 564(b)(1) of the Act, 21 U.S.C. section 360bbb-3(b)(1), unless the authorization is terminated or revoked sooner.    Influenza A by PCR NEGATIVE NEGATIVE Final   Influenza B by PCR NEGATIVE NEGATIVE Final    Comment: (NOTE) The Xpert  Xpress SARS-CoV-2/FLU/RSV assay is intended as an aid in  the diagnosis of influenza from Nasopharyngeal swab specimens and  should not be used as a sole basis for treatment. Nasal washings and  aspirates are unacceptable for Xpert Xpress SARS-CoV-2/FLU/RSV  testing. Fact Sheet for Patients: https://www.moore.com/ Fact Sheet for Healthcare Providers: https://www.young.biz/ This test is not yet approved or cleared by the [place] FDA and  has been authorized for detection and/or diagnosis of SARS-CoV-2 by  FDA under an Emergency Use Authorization (EUA). This EUA will remain  in effect (meaning this test can be used) for the duration of the  Covid-19 declaration under Section 564(b)(1) of the Act, 21  U.S.C. section 360bbb-3(b)(1), unless the authorization is  terminated or revoked. Performed at [facility], [address] [place], [place] [address]   Blood culture (routine x 2)     Status: None (Preliminary result)   Collection Time: 12/19/19  3:31 AM   Specimen: BLOOD RIGHT WRIST  Result Value Ref Range Status   Specimen Description BLOOD RIGHT WRIST  Final   Special Requests   Final    BOTTLES DRAWN AEROBIC AND ANAEROBIC Blood Culture adequate volume   Culture   Final    NO GROWTH 4 DAYS Performed at [facility], [address] [place], [place] [address]    Report Status PENDING  Incomplete  MRSA PCR Screening     Status: None   Collection Time: 12/19/19  6:16 AM   Specimen: Nasal Mucosa; Nasopharyngeal  Result Value Ref Range Status   MRSA by PCR NEGATIVE NEGATIVE Final    Comment:        The GeneXpert MRSA Assay (FDA approved for NASAL specimens only), is one component of a comprehensive MRSA colonization surveillance program. It is not intended to diagnose MRSA infection nor to guide or monitor treatment for MRSA infections. Performed at [facility], [address] [place], [place] [address]  Culture, blood (routine x 2)     Status: None (Preliminary result)   Collection Time: 12/20/19  6:29 PM   Specimen: BLOOD RIGHT WRIST  Result Value Ref Range Status   Specimen Description BLOOD RIGHT WRIST  Final   Special Requests   Final    BOTTLES DRAWN AEROBIC AND ANAEROBIC Blood Culture adequate volume   Culture   Final    NO GROWTH 3 DAYS Performed at [facility], [address], [place], [place] [address]    Report Status PENDING  Incomplete  Culture, blood (routine x 2)     Status: None (Preliminary result)   Collection Time: 12/20/19  6:45 PM   Specimen: BLOOD RIGHT HAND  Result Value Ref Range Status   Specimen Description BLOOD RIGHT HAND  Final   Special Requests   Final    BOTTLES DRAWN AEROBIC AND ANAEROBIC Blood Culture adequate volume   Culture   Final    NO GROWTH 3 DAYS Performed at [facility], [address], [place], [place] [address]    Report Status PENDING  Incomplete      Radiology Studies: No results found.   Scheduled Meds: . Chlorhexidine Gluconate Cloth  6 each Topical Daily  . loratadine  10 mg Oral Daily   Continuous Infusions: . sodium chloride 10 mL/hr at 12/21/19 1600  . cefTRIAXone (ROCEPHIN)  IV 2 g (12/23/19 2145)  . heparin 850 Units/hr (12/24/19 0856)     LOS: 5 days   Time Spent in minutes   45 minutes  Jonai Weyland D.O. on 12/24/2019 at 9:01 AM  Between 7am to 7pm - Please see pager noted on [email or web address]  After 7pm go to [email or web address]  And look for the night coverage person covering for me after hours  [facility] Office  [phone]

## 2019-12-25 LAB — CULTURE, BLOOD (ROUTINE X 2)
Culture: NO GROWTH
Culture: NO GROWTH
Special Requests: ADEQUATE
Special Requests: ADEQUATE

## 2019-12-25 LAB — BASIC METABOLIC PANEL
Anion gap: 10 (ref 5–15)
BUN: 13 mg/dL (ref 6–20)
CO2: 24 mmol/L (ref 22–32)
Calcium: 8.5 mg/dL — ABNORMAL LOW (ref 8.9–10.3)
Chloride: 107 mmol/L (ref 98–111)
Creatinine, Ser: 0.68 mg/dL (ref 0.44–1.00)
GFR calc Af Amer: >60 mL/min (ref >60)
GFR calc non Af Amer: >60 mL/min (ref >60)
Glucose, Bld: 87 mg/dL (ref 70–99)
Potassium: 3.8 mmol/L (ref 3.5–5.1)
Sodium: 141 mmol/L (ref 135–145)

## 2019-12-25 LAB — ABO/RH: ABO/RH(D): B POS

## 2019-12-25 LAB — HEMOGLOBIN AND HEMATOCRIT, BLOOD
HCT: 31.8 % — ABNORMAL LOW (ref 36.0–46.0)
Hemoglobin: 10.5 g/dL — ABNORMAL LOW (ref 12.0–15.0)

## 2019-12-25 LAB — HEPARIN LEVEL (UNFRACTIONATED): Heparin Unfractionated: 0.43 IU/mL (ref 0.30–0.70)

## 2019-12-25 LAB — PREPARE RBC (CROSSMATCH)

## 2019-12-25 MED ORDER — PANTOPRAZOLE SODIUM 40 MG IV SOLR
40.0000 mg | Freq: Two times a day (BID) | INTRAVENOUS | Status: DC
  Administered 2019-12-25 – 2019-12-29 (×9): 40 mg via INTRAVENOUS
  Filled 2019-12-25 (×9): qty 40

## 2019-12-25 MED ORDER — SODIUM CHLORIDE 0.9% IV SOLUTION: Freq: Once | INTRAVENOUS | Status: DC

## 2019-12-25 MED ORDER — CALCIUM CARBONATE ANTACID 500 MG PO CHEW
400.0000 mg | CHEWABLE_TABLET | Freq: Once | ORAL | Status: AC
  Administered 2019-12-25: 400 mg via ORAL
  Filled 2019-12-25: qty 2

## 2019-12-25 MED ORDER — MORPHINE SULFATE (PF) 2 MG/ML IV SOLN
1.0000 mg | INTRAVENOUS | Status: DC | PRN
  Administered 2019-12-30 – 2020-01-05 (×3): 1 mg via INTRAVENOUS
  Filled 2019-12-25 (×3): qty 1

## 2019-12-25 MED ORDER — SODIUM CHLORIDE 0.9% IV SOLUTION: Freq: Once | INTRAVENOUS | Status: AC

## 2019-12-25 NOTE — Progress Notes (Signed)
PROGRESS NOTE    Anna Campos  JQG:[id number] DOB: 12-06-1965 DOA: 12/18/2019 PCP: Farris Has, MD   Brief Narrative:  HPI on 12/19/2019 by Dr. Griffin Basil (ICU) 54 year old woman with no significant past medical history presenting with syncope.  She has been having generalized fatigue.  She has had poor p.o. intake and has not eaten in the last day.  She stood up this evening and passed out.  Family heard her fall.  She did hit her head.  She was told she was not unconscious for a long period of time.  She was noted by EMS to be hypotensive and she was given a small fluid bolus.  Presently she complains of feeling pain all over her body.  She admits to using marijuana today.  She does not take any other medications other than a multivitamin.  She lives with her husband.  She denies that he takes any medications either.  She does not think that she could've inadvertently taken any antihypertensives.  She did recently have tooth pain and had taken some Tylenol with codeine several days ago.  None since.  She denies fevers or chills.  No sick contacts.  No dyspnea.  She notes that she had a loose stool the day before and none since.  No nausea or vomiting.  She feels lightheaded and has a headache.  Does not have vertigo.  She has diffuse myalgias and paresthesias.  Interim history Admitted to ICU due to syncope and hypovolemic shock.  Cardiology was consulted for EKG changes.  Patient was on Levophed however weaned off and transferred to [facility] service on 12/20/2019.  Wound to have Streptococcus bacteremia.  ID consulted and recommended 2 weeks of ceftriaxone.  Dentistry also consulted. Now with anemia, GI consulted.  Assessment & Plan   Hypovolemic shock and syncope -Patient was admitted to ICU and required Levophed which has been weaned off.  Cortisol 23.5 -Cardiology consulted his EKG showed nonspecific changes.  Troponin unremarkable.  Echocardiogram showed normal EF, normal RV size and function  without valvular abnormalities.  No right heart strain.  No further cardiac work-up recommended this -BP is stable and hemodynamically stable  Streptococcus constellatus bacteremia -Infectious disease consulted and appreciated -Recommended continuing ceftriaxone for 2 weeks with start date of 12/19/2019 -Repeat blood cultures from 12/20/2019 show no growth to date -Orthopantogram showed numerous dental caries.  Mild lucency around many of the remaining lower teeth. -CT maxillofacial completed on 12/18/2019 without mention of any dental caries or abscess -Patient is disease recommended dental evaluation -Dentistry, Dr. Kristin Bruins, consulted and appreciated-leaning for surgery sometime next week  PE -Noted on CTA chest: Small nonocclusive pulmonary emboli bilaterally involving segmental and subsegmental sized vessels -Lower extremity ultrasound negative for DVT -Continue IV heparin  Bilateral extremity weakness with incomplete spinal cord injury C3-C6 -Distal lower extremity weaker compared to proximal -Neurosurgery consulted and appreciated.  Patient will eventually need multilevel cervical decompressive surgery once medically stabilized -PT and OT  recommended CIR -CIR consulted and possible pending admission next week -patient complaining of muscle spasms, continue robaxin  -Will add on morphine for pain  Acute normocytic anemia/GI bleed/acute blood loss anemia -Unknown baseline; unknown cause  -hemoglobin dropped to 6.8 today (see on admission hemoglobin approximately 9-10) -Denies hematuria or hematochezia, melena -FOBT positive -Anemia panel showed adequate iron and storage -Gastroenterology consulted and appreciated -Discussed with Dr. Matthias Hughs, patient will likely need EGD and colonoscopy.  Given PE, will continue heparin at this time and monitor H&H closely.  Patient  currently hemodynamically stable. -Have placed patient on Protonix IV twice daily  Nasal congestion/Allergies  -Continue on nasal saline and Claritin   DVT Prophylaxis  heparin  Code Status: Full  Family Communication: None at bedside  Disposition Plan: Admitted from home. Found to have hypovolemic shock and syncope, now resolving.  Found to have strep bacteremia.  Pending dental surgery. Now with anemia, GI consulted.  Patient will need completion of IV antibiotics. Will also need to transition to [place] prior to discharge. Disposition TBD.  Consultants PCCM Infectious disease Neurosurgery Inpatient rehab Dentistry Cardiology  Procedures  Echocardiogram Lower extremity Doppler  Antibiotics   Anti-infectives (From admission, onward)   Start     Dose/Rate Route Frequency Ordered Stop   12/21/19 1000  cefTRIAXone (ROCEPHIN) 2 g in sodium chloride 0.9 % 100 mL IVPB     2 g 200 mL/hr over 30 Minutes Intravenous Every 24 hours 12/21/19 0948 01/02/20 2359   12/20/19 2130  vancomycin (VANCOREADY) IVPB 750 mg/150 mL  Status:  Discontinued     750 mg 150 mL/hr over 60 Minutes Intravenous Every 12 hours 12/20/19 1825 12/21/19 0948   12/20/19 1530  ceFAZolin (ANCEF) IVPB 2g/100 mL premix  Status:  Discontinued     2 g 200 mL/hr over 30 Minutes Intravenous Every 8 hours 12/20/19 1526 12/20/19 1814   12/20/19 1000  vancomycin (VANCOREADY) IVPB 750 mg/150 mL  Status:  Discontinued     750 mg 150 mL/hr over 60 Minutes Intravenous Every 12 hours 12/20/19 0054 12/20/19 1019   12/20/19 0130  vancomycin (VANCOREADY) IVPB 1250 mg/250 mL     1,250 mg 166.7 mL/hr over 90 Minutes Intravenous  Once 12/20/19 0054 12/20/19 0356      Subjective:   Anna Campos seen and examined today.  She continues to have spasms.  Feels nasal congestion and allergies are well controlled now.  Complaining of acid reflux type pain.  Denies any hematochezia or dark stools.  Feels that she is actually constipated.  Denies current chest pain or shortness of breath, dizziness or headache, abdominal pain, nausea or vomiting.    Objective:   Vitals:   12/25/19 0451 12/25/19 0457 12/25/19 0815 12/25/19 0842  BP:  106/64 104/64   Pulse:  82 72   Resp:  17    Temp:  98.5 F (36.9 C)  98.1 F (36.7 C)  TempSrc:  Oral  Oral  SpO2:  100% 100%   Weight: 59.1 kg     Height:        Intake/Output Summary (Last 24 hours) at 12/25/2019 1105 Last data filed at 12/25/2019 0452 Gross per 24 hour  Intake 801.5 ml  Output 1600 ml  Net -798.5 ml   Filed Weights   12/23/19 0250 12/24/19 0343 12/25/19 0451  Weight: 63.4 kg 62 kg 59.1 kg   Exam  General: Well developed, well nourished, NAD, appears stated age  52: NCAT, mucous membranes moist.  Dressing on right cheek  Cardiovascular: S1 S2 auscultated, RRR, no murmur  Respiratory: Clear to auscultation bilaterally   Abdomen: Soft, nontender, nondistended, + bowel sounds  Extremities: warm dry without cyanosis clubbing or edema  Neuro: AAOx3, no new deficits, weakness in all 4 extremities  Psych: Pleasant, appropriate mood and affect  Data Reviewed: I have personally reviewed following labs and imaging studies  CBC: Recent Labs  Lab 12/19/19 1046 12/20/19 0325 12/21/19 0415 12/21/19 0415 12/22/19 0612 12/23/19 0248 12/24/19 0442 12/24/19 0915 12/25/19 0444  WBC 15.2*   < >  5.9  --  6.5 6.3 8.1  --  8.5  NEUTROABS 13.7*  --   --   --   --   --   --   --   --   HGB 10.1*   < > 9.9*   < > 9.6* 9.1* 7.5* 7.6* 6.8*  HCT 31.2*   < > 29.9*   < > 29.6* 28.1* 23.2* 23.3* 21.2*  MCV 92.6   < > 90.9  --  91.1 91.8 92.8  --  93.0  PLT 239   < > 180  --  181 205 229  --  234   < > = values in this interval not displayed.   Basic Metabolic Panel: Recent Labs  Lab 12/18/19 2230 12/18/19 2230 12/18/19 2241 12/19/19 0155 12/20/19 0325 12/21/19 0415 12/25/19 0444  NA 139   < > 141 140 140 141 141  K 3.3*   < > 3.3* 3.7 3.7 3.8 3.8  CL 111   < > 106 114* 115* 113* 107  CO2 22  --   --  19* 21* 21* 24  GLUCOSE 130*   < > 124* 124* 91 80 87  BUN 7   <  > 6 5* <5* 6 13  CREATININE 1.19*   < > 1.10* 0.77 0.79 0.80 0.68  CALCIUM 8.2*  --   --  7.8* 8.0* 8.2* 8.5*  MG 1.6*  --   --  1.4* 2.3 1.9  --   PHOS  --   --   --  1.9*  --   --   --    < > = values in this interval not displayed.   GFR: Estimated Creatinine Clearance: 76.7 mL/min (by C-G formula based on SCr of 0.68 mg/dL). Liver Function Tests: Recent Labs  Lab 12/18/19 2230  AST 19  ALT 11  ALKPHOS 130*  BILITOT 0.6  PROT 5.6*  ALBUMIN 3.1*   Recent Labs  Lab 12/19/19 0155  LIPASE 25   No results for input(s): AMMONIA in the last 168 hours. Coagulation Profile: Recent Labs  Lab 12/18/19 2230  INR 1.1   Cardiac Enzymes: Recent Labs  Lab 12/19/19 0155  CKTOTAL 370*   BNP (last 3 results) No results for input(s): PROBNP in the last 8760 hours. HbA1C: No results for input(s): HGBA1C in the last 72 hours. CBG: Recent Labs  Lab 12/19/19 0617 12/20/19 1141  GLUCAP 113* 92   Lipid Profile: No results for input(s): CHOL, HDL, LDLCALC, TRIG, CHOLHDL, LDLDIRECT in the last 72 hours. Thyroid Function Tests: No results for input(s): TSH, T4TOTAL, FREET4, T3FREE, THYROIDAB in the last 72 hours. Anemia Panel: Recent Labs    12/24/19 0902  VITAMINB12 209  FOLATE 4.4*  FERRITIN 69  TIBC 211*  IRON 78  RETICCTPCT 1.9   Urine analysis:    Component Value Date/Time   COLORURINE YELLOW 12/19/2019 0923   APPEARANCEUR HAZY (A) 12/19/2019 0923   LABSPEC 1.045 (H) 12/19/2019 0923   PHURINE 5.0 12/19/2019 0923   GLUCOSEU NEGATIVE 12/19/2019 0923   HGBUR SMALL (A) 12/19/2019 0923   BILIRUBINUR NEGATIVE 12/19/2019 0923   KETONESUR NEGATIVE 12/19/2019 0923   PROTEINUR NEGATIVE 12/19/2019 0923   NITRITE NEGATIVE 12/19/2019 0923   LEUKOCYTESUR NEGATIVE 12/19/2019 0923   Sepsis Labs: @LABRCNTIP (procalcitonin:4,lacticidven:4)  ) Recent Results (from the past 240 hour(s))  Blood culture (routine x 2)     Status: Abnormal   Collection Time: 12/18/19 10:30 PM    Specimen: BLOOD LEFT  ARM  Result Value Ref Range Status   Specimen Description BLOOD LEFT ARM  Final   Special Requests   Final    BOTTLES DRAWN AEROBIC AND ANAEROBIC Blood Culture results may not be optimal due to an excessive volume of blood received in culture bottles   Culture  Setup Time   Final    GRAM POSITIVE COCCI IN CHAINS IN BOTH AEROBIC AND ANAEROBIC BOTTLES CRITICAL RESULT CALLED TO, READ BACK BY AND VERIFIED WITH: K. HURTH,PHARMD 2221 12/19/2019 Girtha Hake Performed at [facility], [address] [place], [place] [address]    Culture STREPTOCOCCUS CONSTELLATUS (A)  Final   Report Status 12/22/2019 FINAL  Final   Organism ID, Bacteria STREPTOCOCCUS CONSTELLATUS  Final      Susceptibility   Streptococcus constellatus - MIC*    PENICILLIN 0.25 INTERMEDIATE Intermediate     CEFTRIAXONE 1 SENSITIVE Sensitive     ERYTHROMYCIN <=0.12 SENSITIVE Sensitive     LEVOFLOXACIN 0.5 SENSITIVE Sensitive     VANCOMYCIN 0.5 SENSITIVE Sensitive     * STREPTOCOCCUS CONSTELLATUS  Respiratory Panel by RT PCR (Flu A&B, Covid) - Nasopharyngeal Swab     Status: None   Collection Time: 12/18/19 11:05 PM   Specimen: Nasopharyngeal Swab  Result Value Ref Range Status   SARS Coronavirus 2 by RT PCR NEGATIVE NEGATIVE Final    Comment: (NOTE) SARS-CoV-2 target nucleic acids are NOT DETECTED. The SARS-CoV-2 RNA is generally detectable in upper respiratoy specimens during the acute phase of infection. The lowest concentration of SARS-CoV-2 viral copies this assay can detect is 131 copies/mL. A negative result does not preclude SARS-Cov-2 infection and should not be used as the sole basis for treatment or other patient management decisions. A negative result may occur with  improper specimen collection/handling, submission of specimen other than nasopharyngeal swab, presence of viral mutation(s) within the areas targeted by this assay, and inadequate number of viral copies (<131 copies/mL).  A negative result must be combined with clinical observations, patient history, and epidemiological information. The expected result is Negative. Fact Sheet for Patients:  https://www.moore.com/ Fact Sheet for Healthcare Providers:  https://www.young.biz/ This test is not yet ap proved or cleared by the [place] FDA and  has been authorized for detection and/or diagnosis of SARS-CoV-2 by FDA under an Emergency Use Authorization (EUA). This EUA will remain  in effect (meaning this test can be used) for the duration of the COVID-19 declaration under Section 564(b)(1) of the Act, 21 U.S.C. section 360bbb-3(b)(1), unless the authorization is terminated or revoked sooner.    Influenza A by PCR NEGATIVE NEGATIVE Final   Influenza B by PCR NEGATIVE NEGATIVE Final    Comment: (NOTE) The Xpert Xpress SARS-CoV-2/FLU/RSV assay is intended as an aid in  the diagnosis of influenza from Nasopharyngeal swab specimens and  should not be used as a sole basis for treatment. Nasal washings and  aspirates are unacceptable for Xpert Xpress SARS-CoV-2/FLU/RSV  testing. Fact Sheet for Patients: https://www.moore.com/ Fact Sheet for Healthcare Providers: https://www.young.biz/ This test is not yet approved or cleared by the [place] FDA and  has been authorized for detection and/or diagnosis of SARS-CoV-2 by  FDA under an Emergency Use Authorization (EUA). This EUA will remain  in effect (meaning this test can be used) for the duration of the  Covid-19 declaration under Section 564(b)(1) of the Act, 21  U.S.C. section 360bbb-3(b)(1), unless the authorization is  terminated or revoked. Performed at [facility], [address], [place], [place] [address]   Blood culture (routine x 2)     Status: None   Collection Time: 12/19/19  3:31 AM   Specimen: BLOOD RIGHT WRIST  Result Value Ref Range Status   Specimen  Description BLOOD RIGHT WRIST  Final   Special Requests   Final    BOTTLES DRAWN AEROBIC AND ANAEROBIC Blood Culture adequate volume   Culture   Final    NO GROWTH 5 DAYS Performed at [facility], [address] [place], [place] [address]    Report Status 12/24/2019 FINAL  Final  MRSA PCR Screening     Status: None   Collection Time: 12/19/19  6:16 AM   Specimen: Nasal Mucosa; Nasopharyngeal  Result Value Ref Range Status   MRSA by PCR NEGATIVE NEGATIVE Final    Comment:        The GeneXpert MRSA Assay (FDA approved for NASAL specimens only), is one component of a comprehensive MRSA colonization surveillance program. It is not intended to diagnose MRSA infection nor to guide or monitor treatment for MRSA infections. Performed at [facility], [address] [place], [place] [address]   Culture, blood (routine x 2)     Status: None (Preliminary result)   Collection Time: 12/20/19  6:29 PM   Specimen: BLOOD RIGHT WRIST  Result Value Ref Range Status   Specimen Description BLOOD RIGHT WRIST  Final   Special Requests   Final    BOTTLES DRAWN AEROBIC AND ANAEROBIC Blood Culture adequate volume   Culture   Final    NO GROWTH 4 DAYS Performed at [facility], [address] [place], [place] [address]    Report Status PENDING  Incomplete  Culture, blood (routine x 2)     Status: None (Preliminary result)   Collection Time: 12/20/19  6:45 PM   Specimen: BLOOD RIGHT HAND  Result Value Ref Range Status   Specimen Description BLOOD RIGHT HAND  Final   Special Requests   Final    BOTTLES DRAWN AEROBIC AND ANAEROBIC Blood Culture adequate volume   Culture   Final    NO GROWTH 4 DAYS Performed at [facility], [address] [place], [place] [address]    Report Status PENDING  Incomplete      Radiology Studies: DG Orthopantogram  Result Date: 12/24/2019 CLINICAL DATA:  Poor dentition EXAM: ORTHOPANTOGRAM/PANORAMIC COMPARISON:  None. FINDINGS:  Maxillary multiple missing and mandibular teeth. Numerous dental caries. Lucency noted around many of the lower remaining teeth. No acute mandibular fracture. IMPRESSION: Numerous dental caries. Mild lucency around many of the remaining lower teeth. Electronically Signed   By: Charlett Nose M.D.   On: 12/24/2019 09:16     Scheduled Meds: . sodium chloride   Intravenous Once  . sodium chloride   Intravenous Once  . Chlorhexidine Gluconate Cloth  6 each Topical Daily  . loratadine  10 mg Oral Daily  . pantoprazole (PROTONIX) IV  40 mg Intravenous Q12H   Continuous Infusions: . sodium chloride 10 mL/hr at 12/21/19 1600  . cefTRIAXone (ROCEPHIN)  IV 2 g (12/24/19 2228)  . heparin 850 Units/hr (12/24/19 0856)     LOS: 6 days   Time Spent in minutes   45 minutes  Bethel Gaglio D.O. on 12/25/2019 at 11:05 AM  Between 7am to 7pm - Please see pager noted on [email or web address]  After 7pm go to [email or web address]  And look for the night coverage person covering for me  after hours  [employer]  [phone]

## 2019-12-25 NOTE — Consult Note (Signed)
Referring Provider: Cristal Ford Primary Care Physician:  London Pepper, MD Primary Gastroenterologist: None (unassigned)  Reason for Consultation: Heme positive stool, drop in hemoglobin  HPI: Anna Campos is a 54 y.o. female who presented to the hospital about 10 days ago in septic shock due to strep bacteremia associated with poor dentition, sustaining a neck injury after falling at home, apparently leading to spinal cord contusion with resultant quadriparesis (unable to walk or use her arms, but is able to swallow okay), and found to have pulmonary emboli for which she was started on a heparin infusion several days ago, with a stable hemoglobin until yesterday, when it was noted to be 7.5 (verified) compared to a baseline in the 9-10 range; today, it has fallen further to 6.8, prompting transfusion of 2 units of packed cells, with a repeat hemoglobin pending.    The patient's stool is occult heme positive but there has not been any clinically overt GI bleeding.    During this time, the patient's BUN has remained in the normal range, although it did rise from the baseline of 6 to a current level of 13.  The patient has not been on any ulcerogenic medications since admission, was on Aleve on an as-needed basis prior to admission (she states she used it very infrequently).  The patient was not on PPI medication until it was started today.  The patient had a normal screening colonoscopy by Dr. Leonie Douglas in July 2019.   Past Medical History:  Diagnosis Date  . PE (pulmonary thromboembolism) ([place]) 12/20/2019  . Syncope 12/20/2019    Past Surgical History:  Procedure Laterality Date  . CESAREAN SECTION      Prior to Admission medications   Medication Sig Start Date End Date Taking? Authorizing Provider  Multiple Vitamin (MULTIVITAMIN WITH MINERALS) TABS tablet Take 1 tablet by mouth daily.   Yes [provider]  naproxen sodium (ALEVE) 220 MG tablet Take 220 mg by mouth daily  as needed (pain).   Yes [provider]    Current Facility-Administered Medications  Medication Dose Route Frequency Provider Last Rate Last Admin  . 0.9 %  sodium chloride infusion (Manually program via Guardrails IV Fluids)   Intravenous Once Bodenheimer, Charles A, NP      . 0.9 %  sodium chloride infusion   Intravenous PRN Dessa Phi, DO 10 mL/hr at 12/21/19 1600 Rate Verify at 12/21/19 1600  . acetaminophen (TYLENOL) tablet 650 mg  650 mg Oral Q4H PRN Milagros Loll, MD   650 mg at 12/25/19 0608  . benzonatate (TESSALON) capsule 200 mg  200 mg Oral TID PRN Arby Barrette A, NP   200 mg at 12/24/19 0132  . cefTRIAXone (ROCEPHIN) 2 g in sodium chloride 0.9 % 100 mL IVPB  2 g Intravenous Q24H Susa Raring, RPH 200 mL/hr at 12/24/19 2228 2 g at 12/24/19 2228  . Chlorhexidine Gluconate Cloth 2 % PADS 6 each  6 each Topical Daily Candee Furbish, MD   6 each at 12/24/19 2230  . heparin ADULT infusion 100 units/mL (25000 units/227mL sodium chloride 0.45%)  850 Units/hr Intravenous Continuous Franky Macho, RPH 8.5 mL/hr at 12/25/19 1401 850 Units/hr at 12/25/19 1401  . loratadine (CLARITIN) tablet 10 mg  10 mg Oral Daily Cristal Ford, DO   10 mg at 12/25/19 1006  . methocarbamol (ROBAXIN) tablet 500 mg  500 mg Oral Q8H PRN Cristal Ford, DO   500 mg at 12/25/19 1815  . morphine  2 MG/ML injection 1 mg  1 mg Intravenous Q4H PRN Edsel Petrin, DO      . pantoprazole (PROTONIX) injection 40 mg  40 mg Intravenous Q12H Mikhail, Myrtle, DO   40 mg at 12/25/19 1007  . sodium chloride (OCEAN) 0.65 % nasal spray 1 spray  1 spray Each Nare PRN Edsel Petrin, DO        Allergies as of 12/18/2019 - never reviewed  Allergen Reaction Noted  . Ampicillin  12/18/2019    Family History  Problem Relation Age of Onset  . Breast cancer Mother     Social History   Socioeconomic History  . Marital status: Married    Spouse name: Not on file  . Number of children:  Not on file  . Years of education: Not on file  . Highest education level: Not on file  Occupational History  . Not on file  Tobacco Use  . Smoking status: Former Games developer  . Smokeless tobacco: Never Used  Substance and Sexual Activity  . Alcohol use: Yes    Comment: OCCASIONAL  . Drug use: Not Currently  . Sexual activity: Not on file  Other Topics Concern  . Not on file  Social History Narrative  . Not on file   Social Determinants of Health   Financial Resource Strain:   . Difficulty of Paying Living Expenses: Not on file  Food Insecurity:   . Worried About Programme researcher, broadcasting/film/video in the Last Year: Not on file  . Ran Out of Food in the Last Year: Not on file  Transportation Needs:   . Lack of Transportation (Medical): Not on file  . Lack of Transportation (Non-Medical): Not on file  Physical Activity:   . Days of Exercise per Week: Not on file  . Minutes of Exercise per Session: Not on file  Stress:   . Feeling of Stress : Not on file  Social Connections:   . Frequency of Communication with Friends and Family: Not on file  . Frequency of Social Gatherings with Friends and Family: Not on file  . Attends Religious Services: Not on file  . Active Member of Clubs or Organizations: Not on file  . Attends Banker Meetings: Not on file  . Marital Status: Not on file  Intimate Partner Violence:   . Fear of Current or Ex-Partner: Not on file  . Emotionally Abused: Not on file  . Physically Abused: Not on file  . Sexually Abused: Not on file    Review of Systems: No cardiopulmonary symptoms, no upper tract symptoms such as dysphagia, heartburn, nausea, abdominal pain, poor appetite, no lower tract symptoms other than chronic stable constipation.  Did note a recent odor in her urine.  Physical Exam: Vital signs in last 24 hours: Temp:  [98.1 F (36.7 C)-98.8 F (37.1 C)] 98.6 F (37 C) (03/06 1954) Pulse Rate:  [70-85] 85 (03/06 1954) Resp:  [16-17] 17 (03/06  1954) BP: (89-120)/(62-79) 117/79 (03/06 1954) SpO2:  [98 %-100 %] 98 % (03/06 1954) Weight:  [59.1 kg] 59.1 kg (03/06 0451) Last BM Date: 12/24/19 General:   Alert,  Well-developed, well-nourished, pleasant and cooperative in NAD Head:  Normocephalic and atraumatic. Eyes:  Sclera clear, no icterus.   Conjunctiva pink. Lungs:  Clear throughout to auscultation.   No wheezes, crackles, or rhonchi. No evident respiratory distress. Heart:   Regular rate and rhythm; no murmurs, clicks, rubs,  or gallops. Abdomen:  Soft, nontender, nontympanitic, and nondistended. No masses,  hepatosplenomegaly or ventral hernias noted. Msk:   Symmetrical without gross deformities.  Is able to move her head at least moderately up and down into either side. Pulses:  Normal radial pulse is noted. Extremities:   Without clubbing, cyanosis, or edema. Neurologic:  Alert and coherent; is able to lift her arms but cannot really use her hands.  Can move her feet. Skin:  Intact without significant lesions or rashes. Psych:   Alert and cooperative.  Positive mood, no evident anxiety or depression.   Intake/Output from previous day: 03/05 0701 - 03/06 0700 In: 921.5 [P.O.:660; I.V.:161.5; IV Piggyback:100] Out: 1850 [Urine:1850] Intake/Output this shift: Total I/O In: 240 [P.O.:240] Out: 600 [Urine:600]  Lab Results: Recent Labs    12/23/19 0248 12/23/19 0248 12/24/19 0442 12/24/19 0442 12/24/19 0915 12/25/19 0444 12/25/19 1949  WBC 6.3  --  8.1  --   --  8.5  --   HGB 9.1*   < > 7.5*   < > 7.6* 6.8* 10.5*  HCT 28.1*   < > 23.2*   < > 23.3* 21.2* 31.8*  PLT 205  --  229  --   --  234  --    < > = values in this interval not displayed.   BMET Recent Labs    12/25/19 0444  NA 141  K 3.8  CL 107  CO2 24  GLUCOSE 87  BUN 13  CREATININE 0.68  CALCIUM 8.5*   LFT No results for input(s): PROT, ALBUMIN, AST, ALT, ALKPHOS, BILITOT, BILIDIR, IBILI in the last 72 hours. PT/INR No results for input(s):  LABPROT, INR in the last 72 hours.  Studies/Results: DG Orthopantogram  Result Date: 12/24/2019 CLINICAL DATA:  Poor dentition EXAM: ORTHOPANTOGRAM/PANORAMIC COMPARISON:  None. FINDINGS: Maxillary multiple missing and mandibular teeth. Numerous dental caries. Lucency noted around many of the lower remaining teeth. No acute mandibular fracture. IMPRESSION: Numerous dental caries. Mild lucency around many of the remaining lower teeth. Electronically Signed   By: Charlett Nose M.D.   On: 12/24/2019 09:16    Impression: 1.  Recent 2 g drop in hemoglobin with occult heme positivity while on heparin infusion, no overt GI bleeding  2.  Recent strep bacteremia with septic shock, attributed to poor dentition, under treatment  3.  Pulmonary emboli, newly diagnosed, on heparin for the past 5 days  4.  Normal screening colonoscopy about a year and a half ago.  5.  Neck injury from falling while septic, with resultant quadriparesis  Plan: 1.  I do think GI tract evaluation is warranted so that we know whether we can safely continue heparin in this patient.  2.  I will tentatively schedule the patient for upper endoscopy on Monday morning.  The nature, purpose and risks of the procedure were discussed with patient and she is agreeable.  Since she is able to remain on her heparin infusion (i.e., no acute bleeding),, there is not great clinical urgency to do the exam so it can wait until Monday, unless there is a precipitous drop in her hemoglobin between today and tomorrow.  We will alert the anesthesia team about the patient's neck issues in case that leads to any change in their strategy for the sedation for the procedure.  3.  Since the patient has had relatively recent colonoscopy and does not have significant lower tract symptoms, I think the yield of colonoscopy would be quite low and so I would hold off on doing that.  This is especially fortuitous,  given the patient's quadriparesis state, since a prep  and that circumstance would be quite difficult.   LOS: 6 days   Katy Fitch Bronsen Serano  12/25/2019, 8:24 PM   Pager [phone] If no answer or after 5 PM call [phone]

## 2019-12-25 NOTE — Progress Notes (Signed)
2 units PRBCs given and completed at 1810. Order placed for post-transfusion H&H at 2030.

## 2019-12-25 NOTE — Progress Notes (Signed)
CRITICAL VALUE STICKER  CRITICAL VALUE: Hemoglobin 6.8  RECEIVER (on-site recipient of call): Molli Hazard RN  DATE & TIME NOTIFIED: 7711 12/25/19  MESSENGER (representative from lab):  MD NOTIFIED: Bodenheimer  TIME OF NOTIFICATION: 6579  RESPONSE: Pending

## 2019-12-25 NOTE — Progress Notes (Signed)
Consent obtained for blood transfusion. Patient was unable to sign due to quadriplegia; I and charge nurse Lyn attest to and sign for the validity of consent.

## 2019-12-25 NOTE — Progress Notes (Addendum)
Paged Anna Campos at [phone].BP soft; 89/62 MAP 71, HR 73 prior to 1 unit PRBC, 97/62 HR 74 after 1 unit. Upon callback, informed Anna Campos 3 units PRBCs available to give; informed to give 2 units (second unit in progress).   Informed night RN to monitor patient for muscles spasms and PRN Robaxin available q 8 hr.

## 2019-12-25 NOTE — Progress Notes (Signed)
ANTICOAGULATION CONSULT NOTE - Follow Up Consult  Pharmacy Consult for Heparin Indication: pulmonary embolus  Allergies  Allergen Reactions  . Ampicillin Diarrhea    Patient Measurements: Height: 5\' 9"  (175.3 cm) Weight: 130 lb 4.7 oz (59.1 kg) IBW/kg (Calculated) : 66.2 Heparin Dosing Weight: 62 kg  Vital Signs: Temp: 98.5 F (36.9 C) (03/06 0457) Temp Source: Oral (03/06 0457) BP: 106/64 (03/06 0457) Pulse Rate: 82 (03/06 0457)  Labs: Recent Labs    12/23/19 0248 12/23/19 0248 12/23/19 1054 12/24/19 0442 12/24/19 0442 12/24/19 0915 12/25/19 0444  HGB 9.1*   < >  --  7.5*   < > 7.6* 6.8*  HCT 28.1*   < >  --  23.2*  --  23.3* 21.2*  PLT 205  --   --  229  --   --  234  HEPARINUNFRC 0.28*   < > 0.33 0.50  --   --  0.43  CREATININE  --   --   --   --   --   --  0.68   < > = values in this interval not displayed.    Estimated Creatinine Clearance: 76.7 mL/min (by C-G formula based on SCr of 0.68 mg/dL).  Assessment:  54 yr old female with small bilateral PE on CTA.  Pharmacy dosing heparin. On 3/3 RN reported bleeding from facial laceration, sustained in fall PTA. Bleeding resolved. Plans noted for dental evaluation and also noted for cervical decompressive surgery when more stable. Oral anticoagulation plans are for a DOAC closer to discharge.  Heparin level this morning is therapeutic at 0.43 on 850 units/hour. Her H&H has decreased now at 6.8/21.2 with platelets wnl at 234. Note that her FOBT was positive on 3/5. MD aware of decreased H&H and she is ordered to receive pRBC transfusion.    Goal of Therapy:  Heparin level 0.3-0.7 units/ml Monitor platelets by anticoagulation protocol: Yes   Plan:  Continue heparin infusion at 850 units/hour.  Daily CBC and heparin level Monitor for bleeding  Will follow procedural plans Follow up for when to start oral anticoagulation.    Thank you,   06-07-2002, PharmD PGY-1 Pharmacy Resident   Please check amion  for clinical pharmacist contact number

## 2019-12-26 LAB — CBC
HCT: 21.2 % — ABNORMAL LOW (ref 36.0–46.0)
HCT: 30.6 % — ABNORMAL LOW (ref 36.0–46.0)
Hemoglobin: 6.8 g/dL — CL (ref 12.0–15.0)
Hemoglobin: 10.3 g/dL — ABNORMAL LOW (ref 12.0–15.0)
MCH: 29.8 pg (ref 26.0–34.0)
MCH: 29.9 pg (ref 26.0–34.0)
MCHC: 32.1 g/dL (ref 30.0–36.0)
MCHC: 33.7 g/dL (ref 30.0–36.0)
MCV: 93 fL (ref 80.0–100.0)
MCV: 89 fL (ref 80.0–100.0)
Platelets: 234 10*3/uL (ref 150–400)
Platelets: 242 10*3/uL (ref 150–400)
RBC: 2.28 MIL/uL — ABNORMAL LOW (ref 3.87–5.11)
RBC: 3.44 MIL/uL — ABNORMAL LOW (ref 3.87–5.11)
RDW: 12.3 % (ref 11.5–15.5)
RDW: 14.4 % (ref 11.5–15.5)
WBC: 8.5 10*3/uL (ref 4.0–10.5)
WBC: 10.4 10*3/uL (ref 4.0–10.5)
nRBC: 0 % (ref 0.0–0.2)
nRBC: 0 % (ref 0.0–0.2)

## 2019-12-26 LAB — BASIC METABOLIC PANEL
Anion gap: 7 (ref 5–15)
BUN: 9 mg/dL (ref 6–20)
CO2: 26 mmol/L (ref 22–32)
Calcium: 8.7 mg/dL — ABNORMAL LOW (ref 8.9–10.3)
Chloride: 109 mmol/L (ref 98–111)
Creatinine, Ser: 0.76 mg/dL (ref 0.44–1.00)
GFR calc Af Amer: >60 mL/min (ref >60)
GFR calc non Af Amer: >60 mL/min (ref >60)
Glucose, Bld: 92 mg/dL (ref 70–99)
Potassium: 3.7 mmol/L (ref 3.5–5.1)
Sodium: 142 mmol/L (ref 135–145)

## 2019-12-26 LAB — PREPARE RBC (CROSSMATCH)

## 2019-12-26 LAB — HEPARIN LEVEL (UNFRACTIONATED): Heparin Unfractionated: 0.46 IU/mL (ref 0.30–0.70)

## 2019-12-26 MED ORDER — SODIUM CHLORIDE 0.9 % IV SOLN: INTRAVENOUS | Status: DC

## 2019-12-26 NOTE — Plan of Care (Signed)
  Problem: Pain Managment: Goal: General experience of comfort will improve Outcome: Progressing   

## 2019-12-26 NOTE — Progress Notes (Signed)
ANTICOAGULATION CONSULT NOTE - Follow Up Consult  Pharmacy Consult for Heparin Indication: pulmonary embolus  Allergies  Allergen Reactions  . Ampicillin Diarrhea    Patient Measurements: Height: 5\' 9"  (175.3 cm) Weight: 132 lb 0.9 oz (59.9 kg) IBW/kg (Calculated) : 66.2 Heparin Dosing Weight: 62 kg  Vital Signs: Temp: 98.3 F (36.8 C) (03/07 0400) Temp Source: Oral (03/07 0400) BP: 110/78 (03/07 0400) Pulse Rate: 75 (03/07 0400)  Labs: Recent Labs    12/24/19 0442 12/24/19 0915 12/25/19 0444 12/25/19 0444 12/25/19 1949 12/26/19 0405  HGB 7.5*   < > 6.8*   < > 10.5* 10.3*  HCT 23.2*   < > 21.2*  --  31.8* 30.6*  PLT 229  --  234  --   --  242  HEPARINUNFRC 0.50  --  0.43  --   --  0.46  CREATININE  --   --  0.68  --   --  0.76   < > = values in this interval not displayed.    Estimated Creatinine Clearance: 77.8 mL/min (by C-G formula based on SCr of 0.76 mg/dL).  Assessment:  54 yr old female with small bilateral PE on CTA.  Pharmacy dosing heparin. On 3/3 RN reported bleeding from facial laceration, sustained in fall PTA. Bleeding resolved. Plans noted for dental evaluation and also noted for cervical decompressive surgery when more stable. Oral anticoagulation plans are for a DOAC closer to discharge, please see benefit check listed on Treatment Team sticky note and ensure that co-pay is affordable for patient.   Heparin level this morning remains therapeutic at 0.46 on 850 units/hour. Her H&H has increased to 10.3/30.6 today s/p 2 units of pRBC transfused yesterday. Platelets remain wnl at 242. Note that her FOBT was positive on 3/5. GI is following and is scheduling an upper endoscopy for 3/8. Heparin to be continued.    Goal of Therapy:  Heparin level 0.3-0.7 units/ml Monitor platelets by anticoagulation protocol: Yes   Plan:  Continue heparin infusion at 850 units/hour.  Daily CBC and heparin level Monitor for bleeding  Follow up for when to start oral  anticoagulation    Thank you,   5/8, PharmD PGY-1 Pharmacy Resident   Please check amion for clinical pharmacist contact number

## 2019-12-26 NOTE — Progress Notes (Signed)
Hemoglobin holding steady at 10.3 following yesterday's transfusion.  As noted in my earlier note from yesterday, the rising hemoglobin, from 6 to 10, following just 2 units of packed cells, is higher than would be anticipated.  Patient is scheduled for upper endoscopy tomorrow afternoon (earliest available time).  I have ordered that her heparin be stopped at 8 AM tomorrow.  I will try to make sure that anesthesia is made aware of her cervical spinal cord contusion and quadriparesis.  Patient advised, as well, to inform the anesthetist preprocedurally.  Jaslin Novitski V. Ayme Short, M.D. Pager [phone] If no answer or after 5 PM call [phone]     

## 2019-12-26 NOTE — Progress Notes (Signed)
PROGRESS NOTE    Anna Campos  AVW:[id number] DOB: February 10, 1966 DOA: 12/18/2019 PCP: Farris Has, MD   Brief Narrative:  HPI on 12/19/2019 by Dr. Griffin Basil (ICU) 54 year old woman with no significant past medical history presenting with syncope.  She has been having generalized fatigue.  She has had poor p.o. intake and has not eaten in the last day.  She stood up this evening and passed out.  Family heard her fall.  She did hit her head.  She was told she was not unconscious for a long period of time.  She was noted by EMS to be hypotensive and she was given a small fluid bolus.  Presently she complains of feeling pain all over her body.  She admits to using marijuana today.  She does not take any other medications other than a multivitamin.  She lives with her husband.  She denies that he takes any medications either.  She does not think that she could've inadvertently taken any antihypertensives.  She did recently have tooth pain and had taken some Tylenol with codeine several days ago.  None since.  She denies fevers or chills.  No sick contacts.  No dyspnea.  She notes that she had a loose stool the day before and none since.  No nausea or vomiting.  She feels lightheaded and has a headache.  Does not have vertigo.  She has diffuse myalgias and paresthesias.  Interim history Admitted to ICU due to syncope and hypovolemic shock.  Cardiology was consulted for EKG changes.  Patient was on Levophed however weaned off and transferred to [facility] service on 12/20/2019.  Wound to have Streptococcus bacteremia.  ID consulted and recommended 2 weeks of ceftriaxone.  Dentistry also consulted. Now with anemia, GI consulted, pending EGD. Assessment & Plan   Hypovolemic shock and syncope -Patient was admitted to ICU and required Levophed which has been weaned off.  Cortisol 23.5 -Cardiology consulted his EKG showed nonspecific changes.  Troponin unremarkable.  Echocardiogram showed normal EF, normal RV size  and function without valvular abnormalities.  No right heart strain.  No further cardiac work-up recommended this -BP is stable and hemodynamically stable  Streptococcus constellatus bacteremia -Infectious disease consulted and appreciated -Recommended continuing ceftriaxone for 2 weeks with start date of 12/19/2019 -Repeat blood cultures from 12/20/2019 show no growth to date -Orthopantogram showed numerous dental caries.  Mild lucency around many of the remaining lower teeth. -CT maxillofacial completed on 12/18/2019 without mention of any dental caries or abscess -Patient is disease recommended dental evaluation -Dentistry, Dr. Kristin Bruins, consulted and appreciated- pending surgery sometime next week  PE -Noted on CTA chest: Small nonocclusive pulmonary emboli bilaterally involving segmental and subsegmental sized vessels -Lower extremity ultrasound negative for DVT -Continue IV heparin  Bilateral extremity weakness with incomplete spinal cord injury C3-C6 -Distal lower extremity weaker compared to proximal -Neurosurgery consulted and appreciated.  Patient will eventually need multilevel cervical decompressive surgery once medically stabilized -PT and OT  recommended CIR -CIR consulted and possible pending admission next week -patient complaining of muscle spasms, continue robaxin and morphine for pain  Acute normocytic anemia/GI bleed/acute blood loss anemia -Unknown baseline; unknown cause  -hemoglobin dropped to 6.8  (see on admission hemoglobin approximately 9-10) -Denies hematuria or hematochezia, melena -FOBT positive -Anemia panel showed adequate iron and storage -Gastroenterology consulted and appreciated -transfused 2u PRBC, hemoglobin up to 10.3 today -Discussed with Dr. Matthias Hughs, patient will likely need EGD and colonoscopy.  Given PE, will continue heparin at this time  and monitor H&H closely.  Patient currently hemodynamically stable. -Have placed patient on Protonix IV  twice daily -pending EGD on Monday, 3/8  Nasal congestion/Allergies -Continue on nasal saline and Claritin   DVT Prophylaxis  heparin  Code Status: Full  Family Communication: None at bedside  Disposition Plan: Admitted from home. Found to have hypovolemic shock and syncope, now resolving.  Found to have strep bacteremia.  Pending dental surgery. Now with anemia, GI consulted, pending EGD.  Patient will need completion of IV antibiotics. Will also need to transition to [place] prior to discharge. Disposition TBD.  Consultants PCCM Infectious disease Neurosurgery Inpatient rehab Dentistry Cardiology  Procedures  Echocardiogram Lower extremity Doppler  Antibiotics   Anti-infectives (From admission, onward)   Start     Dose/Rate Route Frequency Ordered Stop   12/21/19 1000  cefTRIAXone (ROCEPHIN) 2 g in sodium chloride 0.9 % 100 mL IVPB     2 g 200 mL/hr over 30 Minutes Intravenous Every 24 hours 12/21/19 0948 01/02/20 2359   12/20/19 2130  vancomycin (VANCOREADY) IVPB 750 mg/150 mL  Status:  Discontinued     750 mg 150 mL/hr over 60 Minutes Intravenous Every 12 hours 12/20/19 1825 12/21/19 0948   12/20/19 1530  ceFAZolin (ANCEF) IVPB 2g/100 mL premix  Status:  Discontinued     2 g 200 mL/hr over 30 Minutes Intravenous Every 8 hours 12/20/19 1526 12/20/19 1814   12/20/19 1000  vancomycin (VANCOREADY) IVPB 750 mg/150 mL  Status:  Discontinued     750 mg 150 mL/hr over 60 Minutes Intravenous Every 12 hours 12/20/19 0054 12/20/19 1019   12/20/19 0130  vancomycin (VANCOREADY) IVPB 1250 mg/250 mL     1,250 mg 166.7 mL/hr over 90 Minutes Intravenous  Once 12/20/19 0054 12/20/19 0356      Subjective:   Aliyah Abeyta seen and examined today.  Had a few spasms this morning but feels they are more controlled with the occasions.  Feels congestion has improved. Denies current chest pain, shortness of breath, abdominal pain, N/V, dizziness, headache. Objective:   Vitals:   12/25/19  1813 12/25/19 1954 12/26/19 0100 12/26/19 0400  BP: 120/72 117/79  110/78  Pulse: 81 85  75  Resp: 16 17  17   Temp: 98.8 F (37.1 C) 98.6 F (37 C)  98.3 F (36.8 C)  TempSrc: Oral Oral  Oral  SpO2: 100% 98%  98%  Weight:   59.9 kg   Height:        Intake/Output Summary (Last 24 hours) at 12/26/2019 1010 Last data filed at 12/26/2019 0807 Gross per 24 hour  Intake 1719.56 ml  Output 1651 ml  Net 68.56 ml   Filed Weights   12/24/19 0343 12/25/19 0451 12/26/19 0100  Weight: 62 kg 59.1 kg 59.9 kg   Exam  General: Well developed, well nourished, NAD, appears stated age  HEENT: NCAT, mucous membranes moist. Dressing on right check. Poor dentition  Cardiovascular: S1 S2 auscultated, RRR, no murmur  Respiratory: Clear to auscultation bilaterally with equal chest rise  Abdomen: Soft, nontender, nondistended, + bowel sounds  Extremities: warm dry without cyanosis clubbing or edema  Neuro: AAOx3, no new deficits, weakness in all 4 extremities  Psych: Pleasant, appropriate mood and affect   Data Reviewed: I have personally reviewed following labs and imaging studies  CBC: Recent Labs  Lab 12/19/19 1046 12/20/19 0325 12/22/19 0612 12/22/19 0612 12/23/19 0248 12/23/19 0248 12/24/19 0442 12/24/19 0915 12/25/19 0444 12/25/19 1949 12/26/19 0405  WBC 15.2*   < >  6.5  --  6.3  --  8.1  --  8.5  --  10.4  NEUTROABS 13.7*  --   --   --   --   --   --   --   --   --   --   HGB 10.1*   < > 9.6*   < > 9.1*   < > 7.5* 7.6* 6.8* 10.5* 10.3*  HCT 31.2*   < > 29.6*   < > 28.1*   < > 23.2* 23.3* 21.2* 31.8* 30.6*  MCV 92.6   < > 91.1  --  91.8  --  92.8  --  93.0  --  89.0  PLT 239   < > 181  --  205  --  229  --  234  --  242   < > = values in this interval not displayed.   Basic Metabolic Panel: Recent Labs  Lab 12/20/19 0325 12/21/19 0415 12/25/19 0444 12/26/19 0405  NA 140 141 141 142  K 3.7 3.8 3.8 3.7  CL 115* 113* 107 109  CO2 21* 21* 24 26  GLUCOSE 91 80 87 92    BUN <5* 6 13 9   CREATININE 0.79 0.80 0.68 0.76  CALCIUM 8.0* 8.2* 8.5* 8.7*  MG 2.3 1.9  --   --    GFR: Estimated Creatinine Clearance: 77.8 mL/min (by C-G formula based on SCr of 0.76 mg/dL). Liver Function Tests: No results for input(s): AST, ALT, ALKPHOS, BILITOT, PROT, ALBUMIN in the last 168 hours. No results for input(s): LIPASE, AMYLASE in the last 168 hours. No results for input(s): AMMONIA in the last 168 hours. Coagulation Profile: No results for input(s): INR, PROTIME in the last 168 hours. Cardiac Enzymes: No results for input(s): CKTOTAL, CKMB, CKMBINDEX, TROPONINI in the last 168 hours. BNP (last 3 results) No results for input(s): PROBNP in the last 8760 hours. HbA1C: No results for input(s): HGBA1C in the last 72 hours. CBG: Recent Labs  Lab 12/20/19 1141  GLUCAP 92   Lipid Profile: No results for input(s): CHOL, HDL, LDLCALC, TRIG, CHOLHDL, LDLDIRECT in the last 72 hours. Thyroid Function Tests: No results for input(s): TSH, T4TOTAL, FREET4, T3FREE, THYROIDAB in the last 72 hours. Anemia Panel: Recent Labs    12/24/19 0902  VITAMINB12 209  FOLATE 4.4*  FERRITIN 69  TIBC 211*  IRON 78  RETICCTPCT 1.9   Urine analysis:    Component Value Date/Time   COLORURINE YELLOW 12/19/2019 0923   APPEARANCEUR HAZY (A) 12/19/2019 0923   LABSPEC 1.045 (H) 12/19/2019 0923   PHURINE 5.0 12/19/2019 0923   GLUCOSEU NEGATIVE 12/19/2019 0923   HGBUR SMALL (A) 12/19/2019 0923   BILIRUBINUR NEGATIVE 12/19/2019 0923   KETONESUR NEGATIVE 12/19/2019 0923   PROTEINUR NEGATIVE 12/19/2019 0923   NITRITE NEGATIVE 12/19/2019 0923   LEUKOCYTESUR NEGATIVE 12/19/2019 0923   Sepsis Labs: @LABRCNTIP (procalcitonin:4,lacticidven:4)  ) Recent Results (from the past 240 hour(s))  Blood culture (routine x 2)     Status: Abnormal   Collection Time: 12/18/19 10:30 PM   Specimen: BLOOD LEFT ARM  Result Value Ref Range Status   Specimen Description BLOOD LEFT ARM  Final    Special Requests   Final    BOTTLES DRAWN AEROBIC AND ANAEROBIC Blood Culture results may not be optimal due to an excessive volume of blood received in culture bottles   Culture  Setup Time   Final    GRAM POSITIVE COCCI IN CHAINS IN BOTH AEROBIC AND  ANAEROBIC BOTTLES CRITICAL RESULT CALLED TO, READ BACK BY AND VERIFIED WITH: K. HURTH,PHARMD 2221 12/19/2019 Girtha Hake Performed at [facility], [address], [place], [place] [address]    Culture STREPTOCOCCUS CONSTELLATUS (A)  Final   Report Status 12/22/2019 FINAL  Final   Organism ID, Bacteria STREPTOCOCCUS CONSTELLATUS  Final      Susceptibility   Streptococcus constellatus - MIC*    PENICILLIN 0.25 INTERMEDIATE Intermediate     CEFTRIAXONE 1 SENSITIVE Sensitive     ERYTHROMYCIN <=0.12 SENSITIVE Sensitive     LEVOFLOXACIN 0.5 SENSITIVE Sensitive     VANCOMYCIN 0.5 SENSITIVE Sensitive     * STREPTOCOCCUS CONSTELLATUS  Respiratory Panel by RT PCR (Flu A&B, Covid) - Nasopharyngeal Swab     Status: None   Collection Time: 12/18/19 11:05 PM   Specimen: Nasopharyngeal Swab  Result Value Ref Range Status   SARS Coronavirus 2 by RT PCR NEGATIVE NEGATIVE Final    Comment: (NOTE) SARS-CoV-2 target nucleic acids are NOT DETECTED. The SARS-CoV-2 RNA is generally detectable in upper respiratoy specimens during the acute phase of infection. The lowest concentration of SARS-CoV-2 viral copies this assay can detect is 131 copies/mL. A negative result does not preclude SARS-Cov-2 infection and should not be used as the sole basis for treatment or other patient management decisions. A negative result may occur with  improper specimen collection/handling, submission of specimen other than nasopharyngeal swab, presence of viral mutation(s) within the areas targeted by this assay, and inadequate number of viral copies (<131 copies/mL). A negative result must be combined with clinical observations, patient history, and epidemiological  information. The expected result is Negative. Fact Sheet for Patients:  https://www.moore.com/ Fact Sheet for Healthcare Providers:  https://www.young.biz/ This test is not yet ap proved or cleared by the [place] FDA and  has been authorized for detection and/or diagnosis of SARS-CoV-2 by FDA under an Emergency Use Authorization (EUA). This EUA will remain  in effect (meaning this test can be used) for the duration of the COVID-19 declaration under Section 564(b)(1) of the Act, 21 U.S.C. section 360bbb-3(b)(1), unless the authorization is terminated or revoked sooner.    Influenza A by PCR NEGATIVE NEGATIVE Final   Influenza B by PCR NEGATIVE NEGATIVE Final    Comment: (NOTE) The Xpert Xpress SARS-CoV-2/FLU/RSV assay is intended as an aid in  the diagnosis of influenza from Nasopharyngeal swab specimens and  should not be used as a sole basis for treatment. Nasal washings and  aspirates are unacceptable for Xpert Xpress SARS-CoV-2/FLU/RSV  testing. Fact Sheet for Patients: https://www.moore.com/ Fact Sheet for Healthcare Providers: https://www.young.biz/ This test is not yet approved or cleared by the [place] FDA and  has been authorized for detection and/or diagnosis of SARS-CoV-2 by  FDA under an Emergency Use Authorization (EUA). This EUA will remain  in effect (meaning this test can be used) for the duration of the  Covid-19 declaration under Section 564(b)(1) of the Act, 21  U.S.C. section 360bbb-3(b)(1), unless the authorization is  terminated or revoked. Performed at [facility], [address], [place], [place] [address]   Blood culture (routine x 2)     Status: None   Collection Time: 12/19/19  3:31 AM   Specimen: BLOOD RIGHT WRIST  Result Value Ref Range Status   Specimen Description BLOOD RIGHT WRIST  Final   Special Requests   Final    BOTTLES DRAWN AEROBIC AND  ANAEROBIC Blood Culture adequate volume   Culture  Final    NO GROWTH 5 DAYS Performed at [facility], [address] [place], [place] [address]    Report Status 12/24/2019 FINAL  Final  MRSA PCR Screening     Status: None   Collection Time: 12/19/19  6:16 AM   Specimen: Nasal Mucosa; Nasopharyngeal  Result Value Ref Range Status   MRSA by PCR NEGATIVE NEGATIVE Final    Comment:        The GeneXpert MRSA Assay (FDA approved for NASAL specimens only), is one component of a comprehensive MRSA colonization surveillance program. It is not intended to diagnose MRSA infection nor to guide or monitor treatment for MRSA infections. Performed at [facility], [address] [place], [place] [address]   Culture, blood (routine x 2)     Status: None   Collection Time: 12/20/19  6:29 PM   Specimen: BLOOD RIGHT WRIST  Result Value Ref Range Status   Specimen Description BLOOD RIGHT WRIST  Final   Special Requests   Final    BOTTLES DRAWN AEROBIC AND ANAEROBIC Blood Culture adequate volume   Culture   Final    NO GROWTH 5 DAYS Performed at [facility], [address] [place], [place] [address]    Report Status 12/25/2019 FINAL  Final  Culture, blood (routine x 2)     Status: None   Collection Time: 12/20/19  6:45 PM   Specimen: BLOOD RIGHT HAND  Result Value Ref Range Status   Specimen Description BLOOD RIGHT HAND  Final   Special Requests   Final    BOTTLES DRAWN AEROBIC AND ANAEROBIC Blood Culture adequate volume   Culture   Final    NO GROWTH 5 DAYS Performed at [facility], [address] [place], [place] [address]    Report Status 12/25/2019 FINAL  Final      Radiology Studies: No results found.   Scheduled Meds: . sodium chloride   Intravenous Once  . Chlorhexidine Gluconate Cloth  6 each Topical Daily  . loratadine  10 mg Oral Daily  . pantoprazole (PROTONIX) IV  40 mg Intravenous Q12H   Continuous Infusions: . sodium chloride 10  mL/hr at 12/21/19 1600  . cefTRIAXone (ROCEPHIN)  IV 2 g (12/25/19 2118)  . heparin 850 Units/hr (12/25/19 1401)     LOS: 7 days   Time Spent in minutes   30 minutes  Ajanay Farve D.O. on 12/26/2019 at 10:10 AM  Between 7am to 7pm - Please see pager noted on [email or web address]  After 7pm go to [email or web address]  And look for the night coverage person covering for me after hours  [facility] Office  [phone]

## 2019-12-26 NOTE — H&P (View-Only) (Signed)
Hemoglobin holding steady at 10.3 following yesterday's transfusion.  As noted in my earlier note from yesterday, the rising hemoglobin, from 6 to 10, following just 2 units of packed cells, is higher than would be anticipated.  Patient is scheduled for upper endoscopy tomorrow afternoon (earliest available time).  I have ordered that her heparin be stopped at 8 AM tomorrow.  I will try to make sure that anesthesia is made aware of her cervical spinal cord contusion and quadriparesis.  Patient advised, as well, to inform the anesthetist preprocedurally.  Florencia Reasons, M.D. Pager [phone] If no answer or after 5 PM call [phone]

## 2019-12-27 ENCOUNTER — Inpatient Hospital Stay (HOSPITAL_COMMUNITY): Payer: Commercial Managed Care - PPO | Admitting: Anesthesiology

## 2019-12-27 ENCOUNTER — Encounter (HOSPITAL_COMMUNITY)
Admission: EM | Disposition: A | Discharge status: Rehabilitation Facility | Payer: Self-pay | Source: Home / Self Care | Attending: Internal Medicine

## 2019-12-27 ENCOUNTER — Encounter (HOSPITAL_COMMUNITY): Payer: Self-pay | Admitting: Pulmonary Disease

## 2019-12-27 HISTORY — PX: ESOPHAGOGASTRODUODENOSCOPY (EGD) WITH PROPOFOL: SHX5813

## 2019-12-27 HISTORY — PX: HEMOSTASIS CLIP PLACEMENT: SHX6857

## 2019-12-27 LAB — CBC
HCT: 32.4 % — ABNORMAL LOW (ref 36.0–46.0)
Hemoglobin: 10.3 g/dL — ABNORMAL LOW (ref 12.0–15.0)
MCH: 29.6 pg (ref 26.0–34.0)
MCHC: 31.8 g/dL (ref 30.0–36.0)
MCV: 93.1 fL (ref 80.0–100.0)
Platelets: 287 10*3/uL (ref 150–400)
RBC: 3.48 MIL/uL — ABNORMAL LOW (ref 3.87–5.11)
RDW: 15.1 % (ref 11.5–15.5)
WBC: 10.5 10*3/uL (ref 4.0–10.5)
nRBC: 0 % (ref 0.0–0.2)

## 2019-12-27 LAB — BASIC METABOLIC PANEL
Anion gap: 10 (ref 5–15)
BUN: 11 mg/dL (ref 6–20)
CO2: 23 mmol/L (ref 22–32)
Calcium: 8.7 mg/dL — ABNORMAL LOW (ref 8.9–10.3)
Chloride: 108 mmol/L (ref 98–111)
Creatinine, Ser: 0.84 mg/dL (ref 0.44–1.00)
GFR calc Af Amer: >60 mL/min (ref >60)
GFR calc non Af Amer: >60 mL/min (ref >60)
Glucose, Bld: 88 mg/dL (ref 70–99)
Potassium: 3.8 mmol/L (ref 3.5–5.1)
Sodium: 141 mmol/L (ref 135–145)

## 2019-12-27 LAB — HEPARIN LEVEL (UNFRACTIONATED)
Heparin Unfractionated: 0.52 IU/mL (ref 0.30–0.70)
Heparin Unfractionated: 0.23 IU/mL — ABNORMAL LOW (ref 0.30–0.70)

## 2019-12-27 SURGERY — ESOPHAGOGASTRODUODENOSCOPY (EGD) WITH PROPOFOL
Anesthesia: Monitor Anesthesia Care

## 2019-12-27 MED ORDER — PROPOFOL 10 MG/ML IV BOLUS
INTRAVENOUS | Status: DC | PRN
  Administered 2019-12-27: 50 mg via INTRAVENOUS
  Administered 2019-12-27 (×3): 30 mg via INTRAVENOUS

## 2019-12-27 MED ORDER — LACTATED RINGERS IV SOLN: INTRAVENOUS | Status: DC

## 2019-12-27 MED ORDER — ADULT MULTIVITAMIN W/MINERALS CH
1.0000 | ORAL_TABLET | Freq: Every day | ORAL | Status: DC
  Administered 2019-12-27 – 2020-01-11 (×14): 1 via ORAL
  Filled 2019-12-27 (×15): qty 1

## 2019-12-27 MED ORDER — PROPOFOL 500 MG/50ML IV EMUL
INTRAVENOUS | Status: DC | PRN
  Administered 2019-12-27: 100 ug/kg/min via INTRAVENOUS

## 2019-12-27 MED ORDER — LIDOCAINE HCL (CARDIAC) PF 100 MG/5ML IV SOSY
PREFILLED_SYRINGE | INTRAVENOUS | Status: DC | PRN
  Administered 2019-12-27: 60 mg via INTRATRACHEAL

## 2019-12-27 SURGICAL SUPPLY — 15 items

## 2019-12-27 NOTE — Progress Notes (Signed)
ANTICOAGULATION CONSULT NOTE - Follow Up Consult  Pharmacy Consult for Heparin Indication: pulmonary embolus  Allergies  Allergen Reactions  . Ampicillin Diarrhea    Patient Measurements: Height: 5\' 9"  (175.3 cm) Weight: 132 lb 7.9 oz (60.1 kg) IBW/kg (Calculated) : 66.2 Heparin Dosing Weight: 62 kg  Vital Signs: Temp: 98.4 F (36.9 C) (03/08 2027) Temp Source: Oral (03/08 2027) BP: 102/61 (03/08 2027) Pulse Rate: 73 (03/08 2027)  Labs: Recent Labs    12/25/19 0444 12/25/19 0444 12/25/19 1949 12/26/19 0405 12/27/19 0418 12/27/19 2056  HGB 6.8*   < > 10.5* 10.3* 10.3*  --   HCT 21.2*   < > 31.8* 30.6* 32.4*  --   PLT 234  --   --  242 287  --   HEPARINUNFRC 0.43   < >  --  0.46 0.52 0.23*  CREATININE 0.68  --   --  0.76 0.84  --    < > = values in this interval not displayed.    Estimated Creatinine Clearance: 74.3 mL/min (by C-G formula based on SCr of 0.84 mg/dL).  Assessment: 54 yr old female with small bilateral PE on CTA.  Pharmacy consulted to dose heparin. On 3/3, RN reported bleeding from facial laceration, sustained in fall PTA. Bleeding resolved. Plans noted for dental evaluation and also noted for cervical decompressive surgery when more stable. Oral anticoagulation plans are for a DOAC closer to discharge.  She is S/P EGD with 2 superficial prepyloric ulcers and one endoclip placed. Okay to restart heparin per MD note; heparin was restarted this afternoon at 850 units/hr. Heparin level ~7 hrs after infusion restarted at 850 units/hr was 0.23 units/ml, which is below the goal range for this pt. H/H 10.3/32.4, platelets 287. Per RN, no issues with IV or bleeding observed.  Goal of Therapy:  Heparin level 0.3-0.7 units/ml Monitor platelets by anticoagulation protocol: Yes   Plan:  Increase heparin infusion to 1000 units/hr Check 6-hr heparin level Monitor daily heparin level, CBC Monitor for signs/symptoms of bleeding  01-01-1998, PharmD, BCPS,  [facility] Clinical Pharmacist 12/27/19, 22:18 PM

## 2019-12-27 NOTE — Interval H&P Note (Signed)
History and Physical Interval Note: 53/female with anemia, requiring PRBC transfusions for an EGD today.  12/27/2019 1:02 PM  Anna Campos  has presented today for EGD, with the diagnosis of Heme positive stool and anemia.  The various methods of treatment have been discussed with the patient and family. After consideration of risks, benefits and other options for treatment, the patient has consented to  Procedure(s) with comments: ESOPHAGOGASTRODUODENOSCOPY (EGD) WITH PROPOFOL (N/A) - PLEASE ALERT ANESTHESIA TO THE FACT THAT THE PATIENT HAS SUSTAINED A NECK INJURY AND HAS A QUADRIPARESIS as a surgical intervention.  The patient's history has been reviewed, patient examined, no change in status, stable for surgery.  I have reviewed the patient's chart and labs.  Questions were answered to the patient's satisfaction.     Kerin Salen

## 2019-12-27 NOTE — Progress Notes (Signed)
Inpatient Rehabilitation-Admissions Coordinator   [facility] continues to follow along for completion of medical workup.   Pt and family still discussing CIR option and dispo.   Cheri Rous, OTR/L  Rehab Admissions Coordinator  [phone] 12/27/2019 5:50 PM

## 2019-12-27 NOTE — Anesthesia Preprocedure Evaluation (Addendum)
Anesthesia Evaluation  Patient identified by MRN, date of birth, ID band Patient awake    Reviewed: Allergy & Precautions, NPO status , Patient's Chart, lab work & pertinent test results  Airway Mallampati: III  TM Distance: <3 FB Neck ROM: Limited  Mouth opening: Limited Mouth Opening Comment: UNSTABLE C SPINE Dental no notable dental hx. (+) Poor Dentition, Loose, Dental Advisory Given,    Pulmonary former smoker, PE hx PE 12/2019   Pulmonary exam normal breath sounds clear to auscultation       Cardiovascular Normal cardiovascular exam Rhythm:Regular Rate:Normal  Echo 12/19/19: 1. Left ventricular ejection fraction, by estimation, is 60 to 65%. The left ventricle has normal function. The left ventricle has no regional wall motion abnormalities. Left ventricular diastolic parameters were normal.  2. Right ventricular systolic function is normal. The right ventricular size is normal. Tricuspid regurgitation signal is inadequate for assessing PA pressure.  3. The mitral valve is normal in structure and function. No evidence of  mitral valve regurgitation. No evidence of mitral stenosis.  4. The aortic valve is tricuspid. Aortic valve regurgitation is not visualized. Mild aortic valve sclerosis is present, with no evidence of aortic valve stenosis.  5. The inferior vena cava is dilated in size with >50% respiratory variability, suggesting right atrial pressure of 8 mmHg.    Neuro/Psych Presented 12/19/19 s/p syncope/fall- Bilateral extremity weakness with incomplete spinal cord injury C3-C6, new quadriplegic- will need C spine surgery once medically stabilized  Neuromuscular disease  C-spine not cleared negative psych ROS   GI/Hepatic Neg liver ROS, GERD  Medicated and Controlled,Heme positive stool, anemia   Endo/Other  negative endocrine ROS  Renal/GU negative Renal ROS  negative genitourinary   Musculoskeletal negative  musculoskeletal ROS (+)   Abdominal   Peds negative pediatric ROS (+)  Hematology  (+) Blood dyscrasia, anemia , hct 32.4   Anesthesia Other Findings   Reproductive/Obstetrics negative OB ROS                          Anesthesia Physical Anesthesia Plan  ASA: III  Anesthesia Plan: MAC   Post-op Pain Management:    Induction:   PONV Risk Score and Plan: 2  Airway Management Planned: Natural Airway and Nasal Cannula  Additional Equipment: None  Intra-op Plan:   Post-operative Plan:   Informed Consent: I have reviewed the patients History and Physical, chart, labs and discussed the procedure including the risks, benefits and alternatives for the proposed anesthesia with the patient or authorized representative who has indicated his/her understanding and acceptance.       Plan Discussed with: CRNA  Anesthesia Plan Comments: (C spine precautions to be maintained throughout duration of procedure. EGD for anemia)       Anesthesia Quick Evaluation

## 2019-12-27 NOTE — Anesthesia Postprocedure Evaluation (Signed)
Anesthesia Post Note  Patient: Anna Campos  Procedure(s) Performed: ESOPHAGOGASTRODUODENOSCOPY (EGD) WITH PROPOFOL (N/A ) HEMOSTASIS CLIP PLACEMENT     Patient location during evaluation: Endoscopy Anesthesia Type: MAC Level of consciousness: awake and alert Pain management: pain level controlled Vital Signs Assessment: post-procedure vital signs reviewed and stable Respiratory status: spontaneous breathing, nonlabored ventilation, respiratory function stable and patient connected to nasal cannula oxygen Cardiovascular status: blood pressure returned to baseline and stable Postop Assessment: no apparent nausea or vomiting Anesthetic complications: no    Last Vitals:  Vitals:   12/27/19 1340 12/27/19 1350  BP: (!) 98/58 (!) 95/59  Pulse: 72 64  Resp: 20 11  Temp:    SpO2: 98% 100%    Last Pain:  Vitals:   12/27/19 1350  TempSrc:   PainSc: 0-No pain                 Barnet Glasgow

## 2019-12-27 NOTE — Progress Notes (Signed)
PROGRESS NOTE    Anna Campos  QQP:[id number] DOB: [birth year] DOA: 12/18/2019 PCP: Farris Has, MD   Brief Narrative:  HPI on 12/19/2019 by Dr. Griffin Basil (ICU) 54 year old woman with no significant past medical history presenting with syncope.  She has been having generalized fatigue.  She has had poor p.o. intake and has not eaten in the last day.  She stood up this evening and passed out.  Family heard her fall.  She did hit her head.  She was told she was not unconscious for a long period of time.  She was noted by EMS to be hypotensive and she was given a small fluid bolus.  Presently she complains of feeling pain all over her body.  She admits to using marijuana today.  She does not take any other medications other than a multivitamin.  She lives with her husband.  She denies that he takes any medications either.  She does not think that she could've inadvertently taken any antihypertensives.  She did recently have tooth pain and had taken some Tylenol with codeine several days ago.  None since.  She denies fevers or chills.  No sick contacts.  No dyspnea.  She notes that she had a loose stool the day before and none since.  No nausea or vomiting.  She feels lightheaded and has a headache.  Does not have vertigo.  She has diffuse myalgias and paresthesias.  Interim history Admitted to ICU due to syncope and hypovolemic shock.  Cardiology was consulted for EKG changes.  Patient was on Levophed however weaned off and transferred to [facility] service on 12/20/2019.  Wound to have Streptococcus bacteremia.  ID consulted and recommended 2 weeks of ceftriaxone.  Dentistry also consulted. Now with anemia, GI consulted, pending EGD today. Assessment & Plan   Hypovolemic shock and syncope -Patient was admitted to ICU and required Levophed which has been weaned off.  Cortisol 23.5 -Cardiology consulted his EKG showed nonspecific changes.  Troponin unremarkable.  Echocardiogram showed normal EF, normal RV  size and function without valvular abnormalities.  No right heart strain.  No further cardiac work-up recommended this -BP is stable and hemodynamically stable  Streptococcus constellatus bacteremia -Infectious disease consulted and appreciated -Recommended continuing ceftriaxone for 2 weeks with start date of 12/19/2019 -Repeat blood cultures from 12/20/2019 show no growth to date -Orthopantogram showed numerous dental caries.  Mild lucency around many of the remaining lower teeth. -CT maxillofacial completed on 12/18/2019 without mention of any dental caries or abscess -Patient is disease recommended dental evaluation -Dentistry, Dr. Kristin Bruins, consulted and appreciated- pending surgery sometime this week  PE -Noted on CTA chest: Small nonocclusive pulmonary emboli bilaterally involving segmental and subsegmental sized vessels -Lower extremity ultrasound negative for DVT -Continue IV heparin  Bilateral extremity weakness with incomplete spinal cord injury C3-C6 -Distal lower extremity weaker compared to proximal -Neurosurgery consulted and appreciated.  Patient will eventually need multilevel cervical decompressive surgery once medically stabilized -PT and OT  recommended CIR -CIR consulted and possible pending admission next week -patient complaining of muscle spasms, continue robaxin and morphine for pain  Acute normocytic anemia/GI bleed/acute blood loss anemia -Unknown baseline; unknown cause  -hemoglobin dropped to 6.8  (see on admission hemoglobin approximately 9-10) -Denies hematuria or hematochezia, melena -FOBT positive -Anemia panel showed adequate iron and storage -Gastroenterology consulted and appreciated -transfused 2u PRBC, hemoglobin up to 10.3 today -Discussed with Dr. Matthias Hughs, patient will likely need EGD and colonoscopy.  Given PE, will continue heparin at this  time and monitor H&H closely.  Patient currently hemodynamically stable. -Have placed patient on Protonix IV  twice daily -pending EGD today, 3/8  Nasal congestion/Allergies -Continue on nasal saline and Claritin   DVT Prophylaxis  heparin  Code Status: Full  Family Communication: None at bedside  Disposition Plan: Admitted from home. Found to have hypovolemic shock and syncope, now resolving.  Found to have strep bacteremia.  Pending dental surgery. Now with anemia, GI consulted, pending EGD.  Patient will need completion of IV antibiotics. Will also need to transition to [place] prior to discharge. Disposition TBD- possibly CIR.  Consultants PCCM Infectious disease Neurosurgery Inpatient rehab Dentistry Cardiology  Procedures  Echocardiogram Lower extremity Doppler  Antibiotics   Anti-infectives (From admission, onward)   Start     Dose/Rate Route Frequency Ordered Stop   12/21/19 1000  cefTRIAXone (ROCEPHIN) 2 g in sodium chloride 0.9 % 100 mL IVPB     2 g 200 mL/hr over 30 Minutes Intravenous Every 24 hours 12/21/19 0948 01/02/20 2359   12/20/19 2130  vancomycin (VANCOREADY) IVPB 750 mg/150 mL  Status:  Discontinued     750 mg 150 mL/hr over 60 Minutes Intravenous Every 12 hours 12/20/19 1825 12/21/19 0948   12/20/19 1530  ceFAZolin (ANCEF) IVPB 2g/100 mL premix  Status:  Discontinued     2 g 200 mL/hr over 30 Minutes Intravenous Every 8 hours 12/20/19 1526 12/20/19 1814   12/20/19 1000  vancomycin (VANCOREADY) IVPB 750 mg/150 mL  Status:  Discontinued     750 mg 150 mL/hr over 60 Minutes Intravenous Every 12 hours 12/20/19 0054 12/20/19 1019   12/20/19 0130  vancomycin (VANCOREADY) IVPB 1250 mg/250 mL     1,250 mg 166.7 mL/hr over 90 Minutes Intravenous  Once 12/20/19 0054 12/20/19 0356      Subjective:   Regina Ganci seen and examined today.  Had a few spasms this morning. Feels congestion has improved. Denies current chest pain, shortness of breath, abdominal pain, N/V, dizziness, headache.  Objective:   Vitals:   12/26/19 0400 12/26/19 1012 12/26/19 2037 12/27/19 0312   BP: 110/78 96/60 111/70 104/81  Pulse: 75 70 74 70  Resp: 17 16 16 18   Temp: 98.3 F (36.8 C) 98.1 F (36.7 C) 99.6 F (37.6 C) 98.5 F (36.9 C)  TempSrc: Oral Oral Oral Oral  SpO2: 98% 100% 98% 99%  Weight:    60.1 kg  Height:        Intake/Output Summary (Last 24 hours) at 12/27/2019 1106 Last data filed at 12/27/2019 1035 Gross per 24 hour  Intake 1248 ml  Output 1801 ml  Net -553 ml   Filed Weights   12/25/19 0451 12/26/19 0100 12/27/19 0312  Weight: 59.1 kg 59.9 kg 60.1 kg   Exam  General: Well developed, well nourished, NAD, appears stated age  19: NCAT, mucous membranes moist.  Poor dentition  Cardiovascular: S1 S2 auscultated, RRR, no murmur  Respiratory: Clear to auscultation bilaterally   Abdomen: Soft, nontender, nondistended, + bowel sounds  Extremities: warm dry without cyanosis clubbing or edema  Neuro: AAOx3, no new deficits, weakness in all 4 ext  Psych: Pleasant, appropriate mood and affect  Data Reviewed: I have personally reviewed following labs and imaging studies  CBC: Recent Labs  Lab 12/23/19 0248 12/23/19 0248 12/24/19 0442 12/24/19 0442 12/24/19 0915 12/25/19 0444 12/25/19 1949 12/26/19 0405 12/27/19 0418  WBC 6.3  --  8.1  --   --  8.5  --  10.4 10.5  HGB 9.1*   < > 7.5*   < > 7.6* 6.8* 10.5* 10.3* 10.3*  HCT 28.1*   < > 23.2*   < > 23.3* 21.2* 31.8* 30.6* 32.4*  MCV 91.8  --  92.8  --   --  93.0  --  89.0 93.1  PLT 205  --  229  --   --  234  --  242 287   < > = values in this interval not displayed.   Basic Metabolic Panel: Recent Labs  Lab 12/21/19 0415 12/25/19 0444 12/26/19 0405 12/27/19 0418  NA 141 141 142 141  K 3.8 3.8 3.7 3.8  CL 113* 107 109 108  CO2 21* 24 26 23   GLUCOSE 80 87 92 88  BUN 6 13 9 11   CREATININE 0.80 0.68 0.76 0.84  CALCIUM 8.2* 8.5* 8.7* 8.7*  MG 1.9  --   --   --    GFR: Estimated Creatinine Clearance: 74.3 mL/min (by C-G formula based on SCr of 0.84 mg/dL). Liver Function  Tests: No results for input(s): AST, ALT, ALKPHOS, BILITOT, PROT, ALBUMIN in the last 168 hours. No results for input(s): LIPASE, AMYLASE in the last 168 hours. No results for input(s): AMMONIA in the last 168 hours. Coagulation Profile: No results for input(s): INR, PROTIME in the last 168 hours. Cardiac Enzymes: No results for input(s): CKTOTAL, CKMB, CKMBINDEX, TROPONINI in the last 168 hours. BNP (last 3 results) No results for input(s): PROBNP in the last 8760 hours. HbA1C: No results for input(s): HGBA1C in the last 72 hours. CBG: Recent Labs  Lab 12/20/19 1141  GLUCAP 92   Lipid Profile: No results for input(s): CHOL, HDL, LDLCALC, TRIG, CHOLHDL, LDLDIRECT in the last 72 hours. Thyroid Function Tests: No results for input(s): TSH, T4TOTAL, FREET4, T3FREE, THYROIDAB in the last 72 hours. Anemia Panel: No results for input(s): VITAMINB12, FOLATE, FERRITIN, TIBC, IRON, RETICCTPCT in the last 72 hours. Urine analysis:    Component Value Date/Time   COLORURINE YELLOW 12/19/2019 0923   APPEARANCEUR HAZY (A) 12/19/2019 0923   LABSPEC 1.045 (H) 12/19/2019 0923   PHURINE 5.0 12/19/2019 0923   GLUCOSEU NEGATIVE 12/19/2019 0923   HGBUR SMALL (A) 12/19/2019 0923   BILIRUBINUR NEGATIVE 12/19/2019 0923   KETONESUR NEGATIVE 12/19/2019 0923   PROTEINUR NEGATIVE 12/19/2019 0923   NITRITE NEGATIVE 12/19/2019 0923   LEUKOCYTESUR NEGATIVE 12/19/2019 0923   Sepsis Labs: @LABRCNTIP (procalcitonin:4,lacticidven:4)  ) Recent Results (from the past 240 hour(s))  Blood culture (routine x 2)     Status: Abnormal   Collection Time: 12/18/19 10:30 PM   Specimen: BLOOD LEFT ARM  Result Value Ref Range Status   Specimen Description BLOOD LEFT ARM  Final   Special Requests   Final    BOTTLES DRAWN AEROBIC AND ANAEROBIC Blood Culture results may not be optimal due to an excessive volume of blood received in culture bottles   Culture  Setup Time   Final    GRAM POSITIVE COCCI IN CHAINS IN  BOTH AEROBIC AND ANAEROBIC BOTTLES CRITICAL RESULT CALLED TO, READ BACK BY AND VERIFIED WITH: K. HURTH,PHARMD 2221 12/19/2019 12/20/19 Performed at [facility], [address], [place], [facility] [address]    Culture STREPTOCOCCUS CONSTELLATUS (A)  Final   Report Status 12/22/2019 FINAL  Final   Organism ID, Bacteria STREPTOCOCCUS CONSTELLATUS  Final      Susceptibility   Streptococcus constellatus - MIC*    PENICILLIN 0.25 INTERMEDIATE Intermediate     CEFTRIAXONE 1  SENSITIVE Sensitive     ERYTHROMYCIN <=0.12 SENSITIVE Sensitive     LEVOFLOXACIN 0.5 SENSITIVE Sensitive     VANCOMYCIN 0.5 SENSITIVE Sensitive     * STREPTOCOCCUS CONSTELLATUS  Respiratory Panel by RT PCR (Flu A&B, Covid) - Nasopharyngeal Swab     Status: None   Collection Time: 12/18/19 11:05 PM   Specimen: Nasopharyngeal Swab  Result Value Ref Range Status   SARS Coronavirus 2 by RT PCR NEGATIVE NEGATIVE Final    Comment: (NOTE) SARS-CoV-2 target nucleic acids are NOT DETECTED. The SARS-CoV-2 RNA is generally detectable in upper respiratoy specimens during the acute phase of infection. The lowest concentration of SARS-CoV-2 viral copies this assay can detect is 131 copies/mL. A negative result does not preclude SARS-Cov-2 infection and should not be used as the sole basis for treatment or other patient management decisions. A negative result may occur with  improper specimen collection/handling, submission of specimen other than nasopharyngeal swab, presence of viral mutation(s) within the areas targeted by this assay, and inadequate number of viral copies (<131 copies/mL). A negative result must be combined with clinical observations, patient history, and epidemiological information. The expected result is Negative. Fact Sheet for Patients:  https://www.moore.com/https://www.fda.gov/media/142436/download Fact Sheet for Healthcare Providers:  https://www.young.biz/https://www.fda.gov/media/142435/download This test is not yet ap proved or cleared by  the [place] FDA and  has been authorized for detection and/or diagnosis of SARS-CoV-2 by FDA under an Emergency Use Authorization (EUA). This EUA will remain  in effect (meaning this test can be used) for the duration of the COVID-19 declaration under Section 564(b)(1) of the Act, 21 U.S.C. section 360bbb-3(b)(1), unless the authorization is terminated or revoked sooner.    Influenza A by PCR NEGATIVE NEGATIVE Final   Influenza B by PCR NEGATIVE NEGATIVE Final    Comment: (NOTE) The Xpert Xpress SARS-CoV-2/FLU/RSV assay is intended as an aid in  the diagnosis of influenza from Nasopharyngeal swab specimens and  should not be used as a sole basis for treatment. Nasal washings and  aspirates are unacceptable for Xpert Xpress SARS-CoV-2/FLU/RSV  testing. Fact Sheet for Patients: https://www.moore.com/https://www.fda.gov/media/142436/download Fact Sheet for Healthcare Providers: https://www.young.biz/https://www.fda.gov/media/142435/download This test is not yet approved or cleared by the [place] FDA and  has been authorized for detection and/or diagnosis of SARS-CoV-2 by  FDA under an Emergency Use Authorization (EUA). This EUA will remain  in effect (meaning this test can be used) for the duration of the  Covid-19 declaration under Section 564(b)(1) of the Act, 21  U.S.C. section 360bbb-3(b)(1), unless the authorization is  terminated or revoked. Performed at [facility], [address] [place], [place] [address]   Blood culture (routine x 2)     Status: None   Collection Time: 12/19/19  3:31 AM   Specimen: BLOOD RIGHT WRIST  Result Value Ref Range Status   Specimen Description BLOOD RIGHT WRIST  Final   Special Requests   Final    BOTTLES DRAWN AEROBIC AND ANAEROBIC Blood Culture adequate volume   Culture   Final    NO GROWTH 5 DAYS Performed at [facility], [address] [place], [place] [address]    Report Status 12/24/2019 FINAL  Final  MRSA PCR Screening     Status: None   Collection  Time: 12/19/19  6:16 AM   Specimen: Nasal Mucosa; Nasopharyngeal  Result Value Ref Range Status   MRSA by PCR NEGATIVE NEGATIVE Final    Comment:        The GeneXpert  MRSA Assay (FDA approved for NASAL specimens only), is one component of a comprehensive MRSA colonization surveillance program. It is not intended to diagnose MRSA infection nor to guide or monitor treatment for MRSA infections. Performed at [facility], [address], [place], [place] [address]   Culture, blood (routine x 2)     Status: None   Collection Time: 12/20/19  6:29 PM   Specimen: BLOOD RIGHT WRIST  Result Value Ref Range Status   Specimen Description BLOOD RIGHT WRIST  Final   Special Requests   Final    BOTTLES DRAWN AEROBIC AND ANAEROBIC Blood Culture adequate volume   Culture   Final    NO GROWTH 5 DAYS Performed at [facility], [address] [place], [place] [address]    Report Status 12/25/2019 FINAL  Final  Culture, blood (routine x 2)     Status: None   Collection Time: 12/20/19  6:45 PM   Specimen: BLOOD RIGHT HAND  Result Value Ref Range Status   Specimen Description BLOOD RIGHT HAND  Final   Special Requests   Final    BOTTLES DRAWN AEROBIC AND ANAEROBIC Blood Culture adequate volume   Culture   Final    NO GROWTH 5 DAYS Performed at [facility], [address] [place], [place] [address]    Report Status 12/25/2019 FINAL  Final      Radiology Studies: No results found.   Scheduled Meds: . sodium chloride   Intravenous Once  . Chlorhexidine Gluconate Cloth  6 each Topical Daily  . loratadine  10 mg Oral Daily  . pantoprazole (PROTONIX) IV  40 mg Intravenous Q12H   Continuous Infusions: . sodium chloride 10 mL/hr at 12/21/19 1600  . sodium chloride 20 mL/hr at 12/26/19 1746  . cefTRIAXone (ROCEPHIN)  IV 2 g (12/26/19 2113)  . heparin Stopped (12/27/19 0815)     LOS: 8 days   Time Spent in minutes   30 minutes  Graviela Nodal D.O. on 12/27/2019 at  11:06 AM  Between 7am to 7pm - Please see pager noted on [email or web address]  After 7pm go to [email or web address]  And look for the night coverage person covering for me after hours  [facility] Office  [phone]

## 2019-12-27 NOTE — Plan of Care (Signed)
  Problem: Skin Integrity: Goal: Risk for impaired skin integrity will decrease Outcome: Progressing   

## 2019-12-27 NOTE — Anesthesia Procedure Notes (Signed)
Procedure Name: MAC Date/Time: 12/27/2019 1:09 PM Performed by: Kathryne Hitch, CRNA Pre-anesthesia Checklist: Patient identified, Emergency Drugs available, Suction available, Patient being monitored and Timeout performed Patient Re-evaluated:Patient Re-evaluated prior to induction Oxygen Delivery Method: Nasal cannula Preoxygenation: Pre-oxygenation with 100% oxygen Induction Type: IV induction Dental Injury: Teeth and Oropharynx as per pre-operative assessment

## 2019-12-27 NOTE — Op Note (Signed)
EGD was performed for anemia, FOBT positive stool.   Findings: Normal-appearing esophagus, regular Z-line. Mild erythematous mucosa in entire gastric cavity. 2 superficial prepyloric ulcers, one with clean base and the other with pigmentation. 1 Endo Clip was deployed to close the mucosal defect. Erythematous duodenal bulb, otherwise unremarkable rest of the duodenum. Normal-appearing cardia and fundus.   Recommendations: Advance diet as tolerated, or restart on previous diet. PPI twice daily for 2 months. H. pylori serology, treat if found positive. Okay to restart IV heparin GI will sign off Please recall if needed.   Kerin [place]en, MD

## 2019-12-27 NOTE — Progress Notes (Signed)
ANTICOAGULATION CONSULT NOTE - Follow Up Consult  Pharmacy Consult for Heparin Indication: pulmonary embolus  Allergies  Allergen Reactions  . Ampicillin Diarrhea    Patient Measurements: Height: 5\' 9"  (175.3 cm) Weight: 132 lb 7.9 oz (60.1 kg) IBW/kg (Calculated) : Anna.2 Heparin Dosing Weight: 62 kg  Vital Signs: Temp: 97.9 F (36.6 C) (03/08 1330) Temp Source: Axillary (03/08 1330) BP: 95/59 (03/08 1350) Pulse Rate: 64 (03/08 1350)  Labs: Recent Labs    12/25/19 0444 12/25/19 0444 12/25/19 1949 12/25/19 1949 12/26/19 0405 12/27/19 0418  HGB 6.8*   < > 10.5*   < > 10.3* 10.3*  HCT 21.2*   < > 31.8*  --  30.6* 32.4*  PLT 234  --   --   --  242 287  HEPARINUNFRC 0.43  --   --   --  0.46 0.52  CREATININE 0.68  --   --   --  0.76 0.84   < > = values in this interval not displayed.    Estimated Creatinine Clearance: 74.3 mL/min (by C-G formula based on SCr of 0.84 mg/dL).  Assessment:  54 yr old Campos with small bilateral PE on CTA.  Pharmacy dosing heparin. On 3/3 RN reported bleeding from facial laceration, sustained in fall PTA. Bleeding resolved. Plans noted for dental evaluation and also noted for cervical decompressive surgery when more stable. Oral anticoagulation plans are for a DOAC closer to discharge. -heparin level at goal -Hg= 10.3  She is s/p EGD with 2 superficial prepyloric ulcers and one endoclip placed. Okay to restart heparin per MD note -heparin restarted at ~ 2pm  Goal of Therapy:  Heparin level 0.3-0.7 units/ml Monitor platelets by anticoagulation protocol: Yes   Plan:  -Continue heparin at 850 units/hr -Heparin level in 6 hours and daily wth CBC daily   5/3, PharmD Clinical Pharmacist **Pharmacist phone directory can now be found on [email or web address] ([email or web address]).  Listed under [facility].

## 2019-12-27 NOTE — Transfer of Care (Signed)
Immediate Anesthesia Transfer of Care Note  Patient: Anna Campos  Procedure(s) Performed: ESOPHAGOGASTRODUODENOSCOPY (EGD) WITH PROPOFOL (N/A ) HEMOSTASIS CLIP PLACEMENT  Patient Location: Endo  Anesthesia Type:MAC  Level of Consciousness: awake, alert  and patient cooperative  Airway & Oxygen Therapy: Patient Spontanous Breathing  Post-op Assessment: Report given to RN and Post -op Vital signs reviewed and stable  Post vital signs: Reviewed and stable  Last Vitals:  Vitals Value Taken Time  BP 81/55 12/27/19 1331  Temp    Pulse 94 12/27/19 1331  Resp 15 12/27/19 1331  SpO2 99 % 12/27/19 1331  Vitals shown include unvalidated device data.  Last Pain:  Vitals:   12/27/19 1239  TempSrc: Oral  PainSc: 0-No pain         Complications: No apparent anesthesia complications

## 2019-12-27 NOTE — Progress Notes (Signed)
   Providing Compassionate, Quality Care - Together   Subjective: Patient's husband is at the bedside. Patient reports the numbness in her extremities continues to improve. She tells me she is going for an EGD today.  Objective: Vital signs in last 24 hours: Temp:  [97.9 F (36.6 C)-99.6 F (37.6 C)] 97.9 F (36.6 C) (03/08 1200) Pulse Rate:  [66-74] 66 (03/08 1200) Resp:  [16-18] 18 (03/08 1200) BP: (104-111)/(70-81) 110/70 (03/08 1200) SpO2:  [98 %-100 %] 100 % (03/08 1200) Weight:  [60.1 kg] 60.1 kg (03/08 0312)  Intake/Output from previous day: 03/07 0701 - 03/08 0700 In: 1488 [P.O.:1260; I.V.:228] Out: 2200 [Urine:2200] Intake/Output this shift: Total I/O In: 0  Out: 1 [Stool:1]  Alert and oriented x 4 PERRLA CN II-XII grossly inact 3/5 strength in bilateral deltoids, 2/3 strength in bilateral biceps 2/5 grip strength bilaterally 3/5 bilateral hip flexors Decreased light-touch sensation in all extremities  Lab Results: Recent Labs    12/26/19 0405 12/27/19 0418  WBC 10.4 10.5  HGB 10.3* 10.3*  HCT 30.6* 32.4*  PLT 242 287   BMET Recent Labs    12/26/19 0405 12/27/19 0418  NA 142 141  K 3.7 3.8  CL 109 108  CO2 26 23  GLUCOSE 92 88  BUN 9 11  CREATININE 0.76 0.84  CALCIUM 8.7* 8.7*    Studies/Results: No results found.  Assessment/Plan: Mrs. Chappelle had a syncopal episode at home followed by profound weakness. She was admitted with bacteremia and a pulmonary embolism. MRI scan of the patient's c-spine revealed significant stenosis. Mrs. [place] a significant incomplete spinal cord injury secondary to her fall superimposed upon significant degenerative disease with stenosis most prominently at the C4-5 level but also significantly at the C3-4 and C5-6 levels.   LOS: 8 days    -EGD planned for today (12/27/2019) -Continue to mobilize with therapies -Antibiotics per ID -Heparin per pharmacy for PE -Dental extractions prior to any cervical  hardware placement; extractions planned for this week -Plan for surgery once medically stable   Val Eagle, DNP, AGNP-C Nurse Practitioner  [facility] [address], [address], [place], [place] [address] P: [phone]    F: [phone]  12/27/2019, 12:26 PM

## 2019-12-27 NOTE — Op Note (Signed)
[facility] Patient Name: Anna Campos Procedure Date : 12/27/2019 MRN: [id number] Attending MD: Ronnette Juniper , MD Date of Birth: 07/29/1966 CSN: [id number] Age: 54 Admit Type: Inpatient Procedure:                Upper GI endoscopy Indications:              Acute post hemorrhagic anemia, Occult blood in stool Providers:                Ronnette Juniper, MD, William Dalton, Technician, Benay Pillow, RN Referring MD:             [facility] Medicines:                Monitored Anesthesia Care Complications:            No immediate complications. Estimated blood loss:                            None. Estimated Blood Loss:     Estimated blood loss: none. Procedure:                Pre-Anesthesia Assessment:                           - Prior to the procedure, a History and Physical                            was performed, and patient medications and                            allergies were reviewed. The patient's tolerance of                            previous anesthesia was also reviewed. The risks                            and benefits of the procedure and the sedation                            options and risks were discussed with the patient.                            All questions were answered, and informed consent                            was obtained. Prior Anticoagulants: The patient has                            taken heparin, last dose was day of procedure. ASA                            Grade Assessment: III - A patient with severe  systemic disease. After reviewing the risks and                            benefits, the patient was deemed in satisfactory                            condition to undergo the procedure.                           After obtaining informed consent, the endoscope was                            passed under direct vision. Throughout the                            procedure, the  patient's blood pressure, pulse, and                            oxygen saturations were monitored continuously. The                            GIF-H190 ([id number]) Olympus gastroscope was                            introduced through the mouth, and advanced to the                            second part of duodenum. The upper GI endoscopy was                            accomplished without difficulty. The patient                            tolerated the procedure well. Scope In: Scope Out: Findings:      The upper third of the esophagus, middle third of the esophagus, lower       third of the esophagus and gastroesophageal junction were normal.      The Z-line was regular and was found 37 cm from the incisors.      Diffuse mildly erythematous mucosa without bleeding was found in the       gastric body, in the gastric antrum, in the prepyloric region of the       stomach and at the pylorus.      Two non-bleeding superficial gastric ulcers, one with pigmented material       were found in the prepyloric region of the stomach. The largest lesion       was 6 mm in largest dimension. To close mucosal defect, one hemostatic       clip was successfully placed (MR conditional). There was no bleeding       during, or at the end, of the procedure.      The cardia and gastric fundus were normal on retroflexion.      Localized moderately erythematous mucosa without active bleeding and       with no stigmata of bleeding was found in the duodenal bulb.  The ampulla, first portion of the duodenum and second portion of the       duodenum were normal. Impression:               - Normal upper third of esophagus, middle third of                            esophagus, lower third of esophagus and                            gastroesophageal junction.                           - Z-line regular, 37 cm from the incisors.                           - Erythematous mucosa in the gastric body, antrum,                             prepyloric region of the stomach and pylorus.                           - Non-bleeding gastric ulcers with pigmented                            material. Clip (MR conditional) was placed.                           - Erythematous duodenopathy.                           - Normal ampulla, first portion of the duodenum and                            second portion of the duodenum.                           - No specimens collected. Moderate Sedation:      Patient did not receive moderate sedation for this procedure, but       instead received monitored anesthesia care. Recommendation:           - Advance diet as tolerated.                           - Use Protonix (pantoprazole) 40 mg PO BID for 2                            months.                           - Ok to resume heparin. Procedure Code(s):        --- Professional ---                           [phone], Esophagogastroduodenoscopy, flexible,  transoral; diagnostic, including collection of                            specimen(s) by brushing or washing, when performed                            (separate procedure) Diagnosis Code(s):        --- Professional ---                           K31.89, Other diseases of stomach and duodenum                           K25.9, Gastric ulcer, unspecified as acute or                            chronic, without hemorrhage or perforation                           D62, Acute posthemorrhagic anemia                           R19.5, Other fecal abnormalities CPT copyright 2019 [employer]. All rights reserved. The codes documented in this report are preliminary and upon coder review may  be revised to meet current compliance requirements. Anna Salen, MD 12/27/2019 1:30:05 PM This report has been signed electronically. Number of Addenda: 0

## 2019-12-27 NOTE — Brief Op Note (Signed)
12/18/2019 - 12/27/2019  1:30 PM  PATIENT:  Anna Campos  54 y.o. female  PRE-OPERATIVE DIAGNOSIS:  Heme positive stool and anemia  POST-OPERATIVE DIAGNOSIS:  2 pyloric channel ulcer 1 clip placed   PROCEDURE:  Procedure(s) with comments: ESOPHAGOGASTRODUODENOSCOPY (EGD) WITH PROPOFOL (N/A) - PLEASE ALERT ANESTHESIA TO THE FACT THAT THE PATIENT HAS SUSTAINED A NECK INJURY AND HAS A QUADRIPARESIS HEMOSTASIS CLIP PLACEMENT  SURGEON:  Surgeon(s) and Role:    Ronnette Juniper, MD - Primary  PHYSICIAN ASSISTANT:   ASSISTANTS: Elisha Ponder, RN, William Dalton, Tech  ANESTHESIA:   MAC  EBL:  None  BLOOD ADMINISTERED:none  DRAINS: none   LOCAL MEDICATIONS USED:  NONE  SPECIMEN:  No Specimen  DISPOSITION OF SPECIMEN:  N/A  COUNTS:  YES  TOURNIQUET:  * No tourniquets in log *  DICTATION: .Dragon Dictation  PLAN OF CARE: Admit to inpatient   PATIENT DISPOSITION:  PACU - hemodynamically stable.   Delay start of Pharmacological VTE agent (>24hrs) due to surgical blood loss or risk of bleeding: no

## 2019-12-27 NOTE — Plan of Care (Signed)
  Problem: Education: Goal: Knowledge of General Education information will improve Description Including pain rating scale, medication(s)/side effects and non-pharmacologic comfort measures Outcome: Progressing   

## 2019-12-27 NOTE — Progress Notes (Signed)
Occupational Therapy Treatment Patient Details Name: Genia Perin MRN: [id number] DOB: [birth year] Today's Date: 12/27/2019    History of present illness 54 year old woman with no significant past medical history presenting with syncope and s/p fall. Pt found to have incomplete spincal cord injury, bacteremia, and bilat PEs. N/S following, plan for surgery in 2 weeks.   OT comments  Pt progressing each session. Pt requires increased time and concentration to perform all functional tasks. Pt able to initate reaching for rail with UE and trunk once BLEs have started with OTR assist. Pt maxA/totalA for transfer +1 from recliner to bed with stand pivot. Pt requires knees blocked. Pt continues to require totalA to maxA for ADL. Pt reports feeding self a cookie yesterday. Pt's spouse in room to encourage pt to feed self and perform light ADL as best as she can. Pt performing lap to mouith pattern with mug provided with 90% accuracy each time. Pt requires continued OT skilled services for ADL, mobility and safety. OT following acutely.   Follow Up Recommendations  CIR    Equipment Recommendations  3 in 1 bedside commode    Recommendations for Other Services      Precautions / Restrictions Precautions Precautions: Fall Precaution Comments: central cord, on heparin drip Restrictions Weight Bearing Restrictions: No       Mobility Bed Mobility Overal bed mobility: Needs Assistance Bed Mobility: Rolling;Sidelying to Sit Rolling: Max assist Sidelying to sit: Max assist       General bed mobility comments: Pt able to initate reaching for rail with UE and trunk once BLEs have started with OTR assist  Transfers Overall transfer level: Needs assistance Equipment used: 1 person hand held assist Transfers: Sit to/from [employer] Transfers Sit to Stand: Max assist;Total assist Stand pivot transfers: Max assist;Total assist       General transfer comment: assist for power up; knees  locked to perform stand pivot    Balance Overall balance assessment: Needs assistance Sitting-balance support: Feet supported Sitting balance-Leahy Scale: Poor Sitting balance - Comments: focusing on sitting balance and breathing exercises for trunk control   Standing balance support: During functional activity Standing balance-Leahy Scale: Zero Standing balance comment: dependent upon OT, pt not able to initiate steps yet                           ADL either performed or assessed with clinical judgement   ADL Overall ADL's : Needs assistance/impaired Eating/Feeding: Maximal assistance;Total assistance;Sitting Eating/Feeding Details (indicate cue type and reason): lap to mouth pattern with cup and straw with both hands, supervisionA level                                 Functional mobility during ADLs: Maximal assistance;Cueing for safety;Cueing for sequencing General ADL Comments: Pt requires increased time and concentration to perform all functional tasks.     Vision   Vision Assessment?: No apparent visual deficits   Perception     Praxis      Cognition Arousal/Alertness: Awake/alert Behavior During Therapy: WFL for tasks assessed/performed Overall Cognitive Status: Within Functional Limits for tasks assessed                                          Exercises Exercises: Other exercises Other Exercises Other Exercises:  lap to mouth pattern with drink Other Exercises: AAROM/AROM shoulder through wrist ; PROM for digits x10 reps   Shoulder Instructions       General Comments bandage remains under R eye    Pertinent Vitals/ Pain       Pain Assessment: Faces Faces Pain Scale: No hurt Pain Intervention(s): Monitored during session  Home Living                                          Prior Functioning/Environment              Frequency  Min 3X/week        Progress Toward Goals  OT  Goals(current goals can now be found in the care plan section)  Progress towards OT goals: Progressing toward goals  Acute Rehab OT Goals Patient Stated Goal: "I want to walk again" OT Goal Formulation: With patient Time For Goal Achievement: 01/03/20 Potential to Achieve Goals: Good ADL Goals Pt Will Perform Eating: with min assist;with adaptive utensils;sitting Pt Will Perform Grooming: with min assist;sitting Pt Will Perform Upper Body Dressing: with min assist;sitting Pt Will Transfer to Toilet: with max assist;stand pivot transfer;bedside commode Pt/caregiver will Perform Home Exercise Program: Increased ROM;Increased strength;Both right and left upper extremity;With minimal assist Additional ADL Goal #1: Pt will increase to fair dynamic sitting balance in prep for ADL tasks.  Plan Discharge plan remains appropriate    Co-evaluation                 AM-PAC OT "6 Clicks" Daily Activity     Outcome Measure   Help from another person eating meals?: A Lot Help from another person taking care of personal grooming?: Total Help from another person toileting, which includes using toliet, bedpan, or urinal?: Total Help from another person bathing (including washing, rinsing, drying)?: Total Help from another person to put on and taking off regular upper body clothing?: Total Help from another person to put on and taking off regular lower body clothing?: Total 6 Click Score: 7    End of Session Equipment Utilized During Treatment: Gait belt  OT Visit Diagnosis: Unsteadiness on feet (R26.81);Muscle weakness (generalized) (M62.81)   Activity Tolerance Patient tolerated treatment well   Patient Left in bed;with call bell/phone within reach;with bed alarm set;with family/visitor present   Nurse Communication Mobility status        Time: 7048-8891 OT Time Calculation (min): 32 min  Charges: OT General Charges $OT Visit: 1 Visit OT Treatments $Self Care/Home Management :  8-22 mins $Neuromuscular Re-education: 8-22 mins  Flora Lipps, OTR/L Acute Rehabilitation Services Pager: [phone] Office: [phone]    Lonzo Cloud 12/27/2019, 4:57 PM

## 2019-12-27 NOTE — Progress Notes (Signed)
ANTICOAGULATION CONSULT NOTE - Follow Up Consult  Pharmacy Consult for Heparin Indication: pulmonary embolus  Allergies  Allergen Reactions  . Ampicillin Diarrhea    Patient Measurements: Height: 5\' 9"  (175.3 cm) Weight: 132 lb 7.9 oz (60.1 kg) IBW/kg (Calculated) : 66.2 Heparin Dosing Weight: 62 kg  Vital Signs: Temp: 98.5 F (36.9 C) (03/08 0312) Temp Source: Oral (03/08 0312) BP: 104/81 (03/08 0312) Pulse Rate: 70 (03/08 0312)  Labs: Recent Labs    12/25/19 0444 12/25/19 0444 12/25/19 1949 12/25/19 1949 12/26/19 0405 12/27/19 0418  HGB 6.8*   < > 10.5*   < > 10.3* 10.3*  HCT 21.2*   < > 31.8*  --  30.6* 32.4*  PLT 234  --   --   --  242 287  HEPARINUNFRC 0.43  --   --   --  0.46 0.52  CREATININE 0.68  --   --   --  0.76 0.84   < > = values in this interval not displayed.    Estimated Creatinine Clearance: 74.3 mL/min (by C-G formula based on SCr of 0.84 mg/dL).  Assessment:  54 yr old female with small bilateral PE on CTA.  Pharmacy dosing heparin. On 3/3 RN reported bleeding from facial laceration, sustained in fall PTA. Bleeding resolved. Plans noted for dental evaluation and also noted for cervical decompressive surgery when more stable. Oral anticoagulation plans are for a DOAC closer to discharge. -heparin level at goal -Hg= 10.3  Gi seeing for Plans for heme positive stool and hg 6.8 on 3/6. upper endoscopy today. Heparin off at 8am  Goal of Therapy:  Heparin level 0.3-0.7 units/ml Monitor platelets by anticoagulation protocol: Yes   Plan:  Heparin on hold for upper endoscopy  Will follow plans post procedure  5/3, PharmD Clinical Pharmacist **Pharmacist phone directory can now be found on [email or web address] ([email or web address]).  Listed under [facility].

## 2019-12-27 NOTE — Progress Notes (Signed)
Physical Therapy Treatment Patient Details Name: Anna Campos MRN: [id number] DOB: August 31, 1966 Today's Date: 12/27/2019    History of Present Illness 54 year old woman with no significant past medical history presenting with syncope and s/p fall. Pt found to have incomplete spincal cord injury, bacteremia, and bilat PEs. N/S following, plan for surgery in 2 weeks.    PT Comments    Pt continues to be very motivated and eager to get to rehab. Pt continues to present with significant bilat LE tone into extension/adduction. Pt is able to initiate LAQ in sitting but minimal active movement in the bed. Pt with increased UE movement including digits. Pt also able to perform bridging exercises x 10 reps with PT holding feet on the bed. Pt with dark BM during PT session during standing sessions x 3. Pt continue to require maxAx2 for OOB mobility. Acute PT to continue to follow.  Pt continues to be an excellent candidate for CIR upon d/c for maximal functional recovery.   Follow Up Recommendations  CIR     Equipment Recommendations  Wheelchair (measurements PT);Wheelchair cushion (measurements PT)    Recommendations for Other Services Rehab consult     Precautions / Restrictions Precautions Precautions: Fall Precaution Comments: central cord, on heparin drip Restrictions Weight Bearing Restrictions: No    Mobility  Bed Mobility Overal bed mobility: Needs Assistance Bed Mobility: Rolling;Sidelying to Sit Rolling: Max assist Sidelying to sit: Max assist       General bed mobility comments: pt with significant bilat LE increased tone requiring maxA for LE ROM and to bend at hips/knees to roll to L, total assist for trunk elevation, pt did initiate rolling with R UE  Transfers Overall transfer level: Needs assistance Equipment used: 2 person hand held assist(2 person lift with gait belt) Transfers: Sit to/from Stand;Stand Pivot Transfers Sit to Stand: Max assist;+2 physical  assistance Stand pivot transfers: Max assist;+2 physical assistance       General transfer comment: assist to power up into standing and once in standing pt able to achieve upright posture and maintain bilat LE extension however max-total A needed to pivot feet to recliner; pt unable to assist with bilat UE   Ambulation/Gait             General Gait Details: unable at this time   Stairs             Wheelchair Mobility    Modified Rankin (Stroke Patients Only)       Balance Overall balance assessment: Needs assistance Sitting-balance support: Feet supported Sitting balance-Leahy Scale: Poor Sitting balance - Comments: worked on sitting balance and trunk control, pt with dynamic reaching exercises requiring minA posterior support to prevent falling   Standing balance support: During functional activity Standing balance-Leahy Scale: Zero Standing balance comment: dependent on physical therapist                            Cognition Arousal/Alertness: Awake/alert Behavior During Therapy: WFL for tasks assessed/performed Overall Cognitive Status: Within Functional Limits for tasks assessed                                        Exercises Other Exercises Other Exercises: passive bilat ROM/stretching of bilat ankles into DF, hip and knee flexion    General Comments General comments (skin integrity, edema, etc.): cut under R eye continues  to bleed, strong adduction tone and extensor tone in LEs      Pertinent Vitals/Pain Pain Assessment: Faces Faces Pain Scale: Hurts little more Pain Location: onset of pain in bilat hips and hamstrings with stretching Pain Descriptors / Indicators: Spasm Pain Intervention(s): Monitored during session    Home Living                      Prior Function            PT Goals (current goals can now be found in the care plan section) Progress towards PT goals: Progressing toward goals     Frequency    Min 4X/week      PT Plan Current plan remains appropriate    Co-evaluation              AM-PAC PT "6 Clicks" Mobility   Outcome Measure  Help needed turning from your back to your side while in a flat bed without using bedrails?: A Lot Help needed moving from lying on your back to sitting on the side of a flat bed without using bedrails?: A Lot Help needed moving to and from a bed to a chair (including a wheelchair)?: Total Help needed standing up from a chair using your arms (e.g., wheelchair or bedside chair)?: Total Help needed to walk in hospital room?: Total Help needed climbing 3-5 steps with a railing? : Total 6 Click Score: 8    End of Session Equipment Utilized During Treatment: Gait belt Activity Tolerance: Patient tolerated treatment well Patient left: in chair;with call bell/phone within reach;with chair alarm set Nurse Communication: Mobility status PT Visit Diagnosis: Unsteadiness on feet (R26.81);Other abnormalities of gait and mobility (R26.89);Repeated falls (R29.6);Muscle weakness (generalized) (M62.81);History of falling (Z91.81);Ataxic gait (R26.0);Difficulty in walking, not elsewhere classified (R26.2)     Time: 6761-9509 PT Time Calculation (min) (ACUTE ONLY): 40 min  Charges:  $Therapeutic Exercise: 8-22 mins $Neuromuscular Re-education: 23-37 mins                     Anna Campos, PT, DPT Acute Rehabilitation Services Pager #: [phone] Office #: [phone]    Anna Campos 12/27/2019, 12:20 PM

## 2019-12-28 ENCOUNTER — Encounter: Payer: Self-pay | Admitting: *Deleted

## 2019-12-28 LAB — CBC
HCT: 32.9 % — ABNORMAL LOW (ref 36.0–46.0)
Hemoglobin: 10.7 g/dL — ABNORMAL LOW (ref 12.0–15.0)
MCH: 30.1 pg (ref 26.0–34.0)
MCHC: 32.5 g/dL (ref 30.0–36.0)
MCV: 92.4 fL (ref 80.0–100.0)
Platelets: 320 10*3/uL (ref 150–400)
RBC: 3.56 MIL/uL — ABNORMAL LOW (ref 3.87–5.11)
RDW: 15 % (ref 11.5–15.5)
WBC: 10.4 10*3/uL (ref 4.0–10.5)
nRBC: 0 % (ref 0.0–0.2)

## 2019-12-28 LAB — HEPARIN LEVEL (UNFRACTIONATED): Heparin Unfractionated: 0.39 IU/mL (ref 0.30–0.70)

## 2019-12-28 MED ORDER — SODIUM CHLORIDE 0.9 % IV BOLUS
500.0000 mL | Freq: Once | INTRAVENOUS | Status: AC
  Administered 2019-12-28: 500 mL via INTRAVENOUS

## 2019-12-28 NOTE — Progress Notes (Addendum)
Physical Therapy Treatment Patient Details Name: Anna Campos MRN: [id number] DOB: 03-09-1966 Today's Date: 12/28/2019    History of Present Illness 54 year old woman with no significant past medical history presenting with syncope and s/p fall. Pt found to have incomplete spincal cord injury, bacteremia, and bilat PEs. N/S following, plan for surgery in 2 weeks.    PT Comments    Patient seen for mobility progression. Pt remains very motivated, eager to participate in therapy, and is making progress toward PT goals. Pt continues to require +2 assist for functional transfer training. Continue to recommend CIR for further skilled PT services.    Follow Up Recommendations  CIR     Equipment Recommendations  Wheelchair (measurements PT);Wheelchair cushion (measurements PT)    Recommendations for Other Services Rehab consult     Precautions / Restrictions Precautions Precautions: Fall Precaution Comments: central cord, on heparin drip Restrictions Weight Bearing Restrictions: No    Mobility  Bed Mobility Overal bed mobility: Needs Assistance Bed Mobility: Rolling;Sidelying to Sit Rolling: Mod assist Sidelying to sit: Max assist       General bed mobility comments: assist needed for bilat LE flexion in to get all the way into sidelying, to bring bilat LE from EOB, and then to elevate trunk into sitting; pt initiated rolling toward L side and able to assist with LE   Transfers Overall transfer level: Needs assistance Equipment used: (2 person lift with gait belt) Transfers: Sit to/from [employer] Transfers Sit to Stand: +2 physical assistance;Mod assist Stand pivot transfers: Max assist;+2 physical assistance       General transfer comment: multimodal cues for sequencing and assistance needed to pivot feet with knees blocked  Ambulation/Gait             General Gait Details: unable at this time   Stairs             Wheelchair Mobility     Modified Rankin (Stroke Patients Only)       Balance Overall balance assessment: Needs assistance Sitting-balance support: Feet supported Sitting balance-Leahy Scale: Fair Sitting balance - Comments: fair for static sitting balance; pt could not tolerated challenges without assistance to maintain balance EOB   Standing balance support: During functional activity Standing balance-Leahy Scale: Zero Standing balance comment: worked on lateral weigth shifting in standing                             Cognition Arousal/Alertness: Awake/alert Behavior During Therapy: WFL for tasks assessed/performed Overall Cognitive Status: Within Functional Limits for tasks assessed                                        Exercises General Exercises - Lower Extremity Ankle Circles/Pumps: AROM;Both Long Arc Quad: AROM;Both    General Comments        Pertinent Vitals/Pain Pain Assessment: No/denies pain    Home Living                      Prior Function            PT Goals (current goals can now be found in the care plan section) Progress towards PT goals: Progressing toward goals    Frequency    Min 4X/week      PT Plan Current plan remains appropriate    Co-evaluation PT/OT/SLP Co-Evaluation/Treatment: Yes  Reason for Co-Treatment: Complexity of the patient's impairments (multi-system involvement);For patient/therapist safety;To address functional/ADL transfers PT goals addressed during session: Mobility/safety with mobility;Balance        AM-PAC PT "6 Clicks" Mobility   Outcome Measure  Help needed turning from your back to your side while in a flat bed without using bedrails?: A Lot Help needed moving from lying on your back to sitting on the side of a flat bed without using bedrails?: A Lot Help needed moving to and from a bed to a chair (including a wheelchair)?: Total Help needed standing up from a chair using your arms (e.g.,  wheelchair or bedside chair)?: A Lot Help needed to walk in hospital room?: Total Help needed climbing 3-5 steps with a railing? : Total 6 Click Score: 9    End of Session Equipment Utilized During Treatment: Gait belt Activity Tolerance: Patient tolerated treatment well Patient left: in chair;with call bell/phone within reach;with chair alarm set;with family/visitor present Nurse Communication: Mobility status PT Visit Diagnosis: Unsteadiness on feet (R26.81);Other abnormalities of gait and mobility (R26.89);Repeated falls (R29.6);Muscle weakness (generalized) (M62.81);History of falling (Z91.81);Ataxic gait (R26.0);Difficulty in walking, not elsewhere classified (R26.2)     Time: 3662-9476 PT Time Calculation (min) (ACUTE ONLY): 34 min  Charges:  $Gait Training: 8-22 mins                     Earney Navy, PTA Acute Rehabilitation Services Pager: [phone] Office: [phone]     Darliss Cheney 12/28/2019, 11:25 AM

## 2019-12-28 NOTE — Progress Notes (Signed)
Occupational Therapy Treatment Patient Details Name: Anna Campos MRN: [id number] DOB: January 16, 1966 Today's Date: 12/28/2019    History of present illness 54 year old woman with no significant past medical history presenting with syncope and s/p fall. Pt found to have incomplete spincal cord injury, bacteremia, and bilat PEs. N/S following, plan for surgery in 2 weeks.   OT comments  Patient very pleasant and cooperative, motivated to go to CIR with hopeful outlook on upcoming surgery. Patient requires mod to max Ax2 for functional transfers and practice sit <> stand with blocking of LEs. Will continue to follow.   Follow Up Recommendations  CIR    Equipment Recommendations  3 in 1 bedside commode       Precautions / Restrictions Precautions Precautions: Fall Precaution Comments: central cord, on heparin drip Restrictions Weight Bearing Restrictions: No       Mobility Bed Mobility Overal bed mobility: Needs Assistance Bed Mobility: Rolling;Sidelying to Sit Rolling: Mod assist Sidelying to sit: Max assist       General bed mobility comments: patient able to initiate rolling to L side this session, requires assist to bend LEs and for trunk elevation  Transfers Overall transfer level: Needs assistance Equipment used: (2 person lift with gait belt) Transfers: Sit to/from [employer] Transfers Sit to Stand: +2 physical assistance;Mod assist Stand pivot transfers: Max assist;+2 physical assistance       General transfer comment: multimodal cues for sequencing and assistance needed to pivot feet with knees blocked    Balance Overall balance assessment: Needs assistance Sitting-balance support: Feet supported Sitting balance-Leahy Scale: Fair Sitting balance - Comments: for static sitting, requires assist with dynamic sitting   Standing balance support: During functional activity Standing balance-Leahy Scale: Zero Standing balance comment: worked on lateral weigth  shifting in standing                            ADL either performed or assessed with clinical judgement   ADL Overall ADL's : Needs assistance/impaired                         Toilet Transfer: Maximal assistance;+2 for physical assistance;+2 for safety/equipment;BSC;Stand-pivot Toilet Transfer Details (indicate cue type and reason): simulated with transfer to recliner, requires assist to weight shift and for controlled LB movement         Functional mobility during ADLs: Maximal assistance;+2 for physical assistance;+2 for safety/equipment                 Cognition Arousal/Alertness: Awake/alert Behavior During Therapy: WFL for tasks assessed/performed Overall Cognitive Status: Within Functional Limits for tasks assessed                                                     Pertinent Vitals/ Pain       Pain Assessment: No/denies pain         Frequency  Min 3X/week        Progress Toward Goals  OT Goals(current goals can now be found in the care plan section)  Progress towards OT goals: Progressing toward goals  Acute Rehab OT Goals Patient Stated Goal: "I want to walk again" OT Goal Formulation: With patient Time For Goal Achievement: 01/03/20 Potential to Achieve Goals: Good ADL Goals Pt Will Perform  Eating: with min assist;with adaptive utensils;sitting Pt Will Perform Grooming: with min assist;sitting Pt Will Perform Upper Body Dressing: with min assist;sitting Pt Will Transfer to Toilet: with max assist;stand pivot transfer;bedside commode Pt/caregiver will Perform Home Exercise Program: Increased ROM;Increased strength;Both right and left upper extremity;With minimal assist Additional ADL Goal #1: Pt will increase to fair dynamic sitting balance in prep for ADL tasks.  Plan Discharge plan remains appropriate    Co-evaluation    PT/OT/SLP Co-Evaluation/Treatment: Yes Reason for Co-Treatment: Complexity of  the patient's impairments (multi-system involvement);For patient/therapist safety;To address functional/ADL transfers PT goals addressed during session: Mobility/safety with mobility;Balance OT goals addressed during session: ADL's and self-care      AM-PAC OT "6 Clicks" Daily Activity     Outcome Measure   Help from another person eating meals?: A Lot Help from another person taking care of personal grooming?: Total Help from another person toileting, which includes using toliet, bedpan, or urinal?: Total Help from another person bathing (including washing, rinsing, drying)?: Total Help from another person to put on and taking off regular upper body clothing?: Total Help from another person to put on and taking off regular lower body clothing?: Total 6 Click Score: 7    End of Session Equipment Utilized During Treatment: Gait belt  OT Visit Diagnosis: Unsteadiness on feet (R26.81);Muscle weakness (generalized) (M62.81)   Activity Tolerance Patient tolerated treatment well   Patient Left in bed;with call bell/phone within reach;with bed alarm set;with family/visitor present   Nurse Communication Mobility status        Time: 6269-4854 OT Time Calculation (min): 33 min  Charges: OT General Charges $OT Visit: 1 Visit OT Treatments $Self Care/Home Management : 8-22 mins  Shon Millet OT OT office: [place] 12/28/2019, 1:56 PM

## 2019-12-28 NOTE — Progress Notes (Signed)
ANTICOAGULATION CONSULT NOTE - Follow Up Consult  Pharmacy Consult for Heparin Indication: pulmonary embolus  Assessment: 54 yr old female with small bilateral PE on CTA.  Pharmacy consulted to dose heparin. On 3/3, RN reported bleeding from facial laceration, sustained in fall PTA. Bleeding resolved. Plans noted for dental evaluation and also noted for cervical decompressive surgery when more stable. Oral anticoagulation plans are for a DOAC closer to discharge.  Heparin level this morning 0.39 units/ml  Goal of Therapy:  Heparin level 0.3-0.7 units/ml Monitor platelets by anticoagulation protocol: Yes   Plan:  Cont heparin infusion at 1000 units/hr Monitor daily heparin level, CBC Monitor for signs/symptoms of bleeding  Thanks for allowing pharmacy to be a part of this patient's care.  Talbert Cage, PharmD Clinical Pharmacist 12/27/19, 22:18 PM

## 2019-12-28 NOTE — Progress Notes (Signed)
PROGRESS NOTE    Anna Campos  NWG:[id number] DOB: 1966-08-22 DOA: 12/18/2019 PCP: Farris Has, MD   Brief Narrative:  HPI on 12/19/2019 by Dr. Griffin Basil (ICU) 54 year old woman with no significant past medical history presenting with syncope.  She has been having generalized fatigue.  She has had poor p.o. intake and has not eaten in the last day.  She stood up this evening and passed out.  Family heard her fall.  She did hit her head.  She was told she was not unconscious for a long period of time.  She was noted by EMS to be hypotensive and she was given a small fluid bolus.  Presently she complains of feeling pain all over her body.  She admits to using marijuana today.  She does not take any other medications other than a multivitamin.  She lives with her husband.  She denies that he takes any medications either.  She does not think that she could've inadvertently taken any antihypertensives.  She did recently have tooth pain and had taken some Tylenol with codeine several days ago.  None since.  She denies fevers or chills.  No sick contacts.  No dyspnea.  She notes that she had a loose stool the day before and none since.  No nausea or vomiting.  She feels lightheaded and has a headache.  Does not have vertigo.  She has diffuse myalgias and paresthesias.  Interim history Admitted to ICU due to syncope and hypovolemic shock.  Cardiology was consulted for EKG changes.  Patient was on Levophed however weaned off and transferred to [facility] service on 12/20/2019.  Wound to have Streptococcus bacteremia.  ID consulted and recommended 2 weeks of ceftriaxone.  Dentistry also consulted. Now with anemia, GI consulted, s/p EGD. Now pending dental surgery this week. Possibly CIR afterwards.  Assessment & Plan   Hypovolemic shock and syncope -Patient was admitted to ICU and required Levophed which has been weaned off.  Cortisol 23.5 -Cardiology consulted his EKG showed nonspecific changes.  Troponin  unremarkable.  Echocardiogram showed normal EF, normal RV size and function without valvular abnormalities.  No right heart strain.  No further cardiac work-up recommended this -BP is stable and hemodynamically stable  Streptococcus constellatus bacteremia -Infectious disease consulted and appreciated -Recommended continuing ceftriaxone for 2 weeks with start date of 12/19/2019 -Repeat blood cultures from 12/20/2019 show no growth to date -Orthopantogram showed numerous dental caries.  Mild lucency around many of the remaining lower teeth. -CT maxillofacial completed on 12/18/2019 without mention of any dental caries or abscess -Patient is disease recommended dental evaluation -Dentistry, Dr. Kristin Bruins, consulted and appreciated- pending surgery sometime this week  PE -Noted on CTA chest: Small nonocclusive pulmonary emboli bilaterally involving segmental and subsegmental sized vessels -Lower extremity ultrasound negative for DVT -Continue IV heparin  Bilateral extremity weakness with incomplete spinal cord injury C3-C6 -Distal lower extremity weaker compared to proximal -Neurosurgery consulted and appreciated.  Patient will eventually need multilevel cervical decompressive surgery once medically stabilized -PT and OT  recommended CIR -CIR consulted and possible pending admission next week -patient complaining of muscle spasms, continue robaxin and morphine for pain  Acute normocytic anemia/GI bleed/acute blood loss anemia -Unknown baseline; unknown cause  -hemoglobin dropped to 6.8  (see on admission hemoglobin approximately 9-10) -Denies hematuria or hematochezia, melena -FOBT positive -Anemia panel showed adequate iron and storage -Gastroenterology consulted and appreciated -transfused 2u PRBC, hemoglobin up to 10.7 today -Discussed with Dr. Matthias Hughs, patient will likely need EGD and  colonoscopy.  Given PE, will continue heparin at this time and monitor H&H closely.  Patient currently  hemodynamically stable. -Have placed patient on Protonix IV twice daily -Status post EGD on 12/27/2019 showing mild erythematous mucosa in the entire gastric cavity.  2 superficial prepyloric ulcers 1 with clean base the other with pigmentation.  1 Endo Clip was deployed to close the mucosal defect.  Erythematous duodenal bulb.  PPI BID recommended for 2 months.  Okay to restart IV heparin.  Nasal congestion/Allergies -Continue on nasal saline and Claritin   DVT Prophylaxis  heparin  Code Status: Full  Family Communication: None at bedside  Disposition Plan: Admitted from home. Found to have hypovolemic shock and syncope, now resolving.  Found to have strep bacteremia.  Pending dental surgery.  Patient will need completion of IV antibiotics. Will also need to transition to DOAC prior to discharge. Disposition TBD- possibly CIR.  Consultants PCCM Infectious disease Neurosurgery Inpatient rehab Dentistry Cardiology  Procedures  Echocardiogram Lower extremity Doppler  Antibiotics   Anti-infectives (From admission, onward)   Start     Dose/Rate Route Frequency Ordered Stop   12/21/19 1000  cefTRIAXone (ROCEPHIN) 2 g in sodium chloride 0.9 % 100 mL IVPB     2 g 200 mL/hr over 30 Minutes Intravenous Every 24 hours 12/21/19 0948 01/02/20 2359   12/20/19 2130  vancomycin (VANCOREADY) IVPB 750 mg/150 mL  Status:  Discontinued     750 mg 150 mL/hr over 60 Minutes Intravenous Every 12 hours 12/20/19 1825 12/21/19 0948   12/20/19 1530  ceFAZolin (ANCEF) IVPB 2g/100 mL premix  Status:  Discontinued     2 g 200 mL/hr over 30 Minutes Intravenous Every 8 hours 12/20/19 1526 12/20/19 1814   12/20/19 1000  vancomycin (VANCOREADY) IVPB 750 mg/150 mL  Status:  Discontinued     750 mg 150 mL/hr over 60 Minutes Intravenous Every 12 hours 12/20/19 0054 12/20/19 1019   12/20/19 0130  vancomycin (VANCOREADY) IVPB 1250 mg/250 mL     1,250 mg 166.7 mL/hr over 90 Minutes Intravenous  Once 12/20/19 0054  12/20/19 0356      Subjective:   Anna Campos seen and examined today.  Feels that the muscle spasms have improved.  Denies any further congestion.  Denies current chest pain or shortness of breath, abdominal pain, nausea or vomiting, dizziness or headache.  Objective:   Vitals:   12/27/19 1644 12/27/19 2027 12/28/19 0501 12/28/19 0800  BP: (!) 92/58 102/61 113/75   Pulse: 68 73 69   Resp: 18 20 18    Temp: 97.9 F (36.6 C) 98.4 F (36.9 C) 98.5 F (36.9 C)   TempSrc: Oral Oral Oral   SpO2: 100% 97% 100% 100%  Weight:   57.4 kg   Height:        Intake/Output Summary (Last 24 hours) at 12/28/2019 1038 Last data filed at 12/28/2019 0841 Gross per 24 hour  Intake 1348.22 ml  Output 650 ml  Net 698.22 ml   Filed Weights   12/26/19 0100 12/27/19 0312 12/28/19 0501  Weight: 59.9 kg 60.1 kg 57.4 kg   Exam  General: Well developed, well nourished, NAD, appears stated age  HEENT: NCAT, mucous membranes moist. Poor dentition   Cardiovascular: S1 S2 auscultated, RRR, no murmur  Respiratory: Clear to auscultation bilaterally   Abdomen: Soft, nontender, nondistended, + bowel sounds  Extremities: warm dry without cyanosis clubbing or edema  Neuro: AAOx3, no new deficits, weakness in all 4 ext  Psych: Normal affect  and demeanor, pleasant   Data Reviewed: I have personally reviewed following labs and imaging studies  CBC: Recent Labs  Lab 12/24/19 0442 12/24/19 0915 12/25/19 0444 12/25/19 1949 12/26/19 0405 12/27/19 0418 12/28/19 0453  WBC 8.1  --  8.5  --  10.4 10.5 10.4  HGB 7.5*   < > 6.8* 10.5* 10.3* 10.3* 10.7*  HCT 23.2*   < > 21.2* 31.8* 30.6* 32.4* 32.9*  MCV 92.8  --  93.0  --  89.0 93.1 92.4  PLT 229  --  234  --  242 287 320   < > = values in this interval not displayed.   Basic Metabolic Panel: Recent Labs  Lab 12/25/19 0444 12/26/19 0405 12/27/19 0418  NA 141 142 141  K 3.8 3.7 3.8  CL 107 109 108  CO2 24 26 23   GLUCOSE 87 92 88  BUN 13 9 11     CREATININE 0.68 0.76 0.84  CALCIUM 8.5* 8.7* 8.7*   GFR: Estimated Creatinine Clearance: 71 mL/min (by C-G formula based on SCr of 0.84 mg/dL). Liver Function Tests: No results for input(s): AST, ALT, ALKPHOS, BILITOT, PROT, ALBUMIN in the last 168 hours. No results for input(s): LIPASE, AMYLASE in the last 168 hours. No results for input(s): AMMONIA in the last 168 hours. Coagulation Profile: No results for input(s): INR, PROTIME in the last 168 hours. Cardiac Enzymes: No results for input(s): CKTOTAL, CKMB, CKMBINDEX, TROPONINI in the last 168 hours. BNP (last 3 results) No results for input(s): PROBNP in the last 8760 hours. HbA1C: No results for input(s): HGBA1C in the last 72 hours. CBG: No results for input(s): GLUCAP in the last 168 hours. Lipid Profile: No results for input(s): CHOL, HDL, LDLCALC, TRIG, CHOLHDL, LDLDIRECT in the last 72 hours. Thyroid Function Tests: No results for input(s): TSH, T4TOTAL, FREET4, T3FREE, THYROIDAB in the last 72 hours. Anemia Panel: No results for input(s): VITAMINB12, FOLATE, FERRITIN, TIBC, IRON, RETICCTPCT in the last 72 hours. Urine analysis:    Component Value Date/Time   COLORURINE YELLOW 12/19/2019 0923   APPEARANCEUR HAZY (A) 12/19/2019 0923   LABSPEC 1.045 (H) 12/19/2019 0923   PHURINE 5.0 12/19/2019 0923   GLUCOSEU NEGATIVE 12/19/2019 0923   HGBUR SMALL (A) 12/19/2019 0923   BILIRUBINUR NEGATIVE 12/19/2019 0923   KETONESUR NEGATIVE 12/19/2019 0923   PROTEINUR NEGATIVE 12/19/2019 0923   NITRITE NEGATIVE 12/19/2019 0923   LEUKOCYTESUR NEGATIVE 12/19/2019 0923   Sepsis Labs: @LABRCNTIP (procalcitonin:4,lacticidven:4)  ) Recent Results (from the past 240 hour(s))  Blood culture (routine x 2)     Status: Abnormal   Collection Time: 12/18/19 10:30 PM   Specimen: BLOOD LEFT ARM  Result Value Ref Range Status   Specimen Description BLOOD LEFT ARM  Final   Special Requests   Final    BOTTLES DRAWN AEROBIC AND ANAEROBIC  Blood Culture results may not be optimal due to an excessive volume of blood received in culture bottles   Culture  Setup Time   Final    GRAM POSITIVE COCCI IN CHAINS IN BOTH AEROBIC AND ANAEROBIC BOTTLES CRITICAL RESULT CALLED TO, READ BACK BY AND VERIFIED WITH: K. HURTH,PHARMD 2221 12/19/2019 Girtha Hake. TYSOR Performed at [facility], [address] [place], [place] [address]    Culture STREPTOCOCCUS CONSTELLATUS (A)  Final   Report Status 12/22/2019 FINAL  Final   Organism ID, Bacteria STREPTOCOCCUS CONSTELLATUS  Final      Susceptibility   Streptococcus constellatus - MIC*    PENICILLIN 0.25 INTERMEDIATE Intermediate  CEFTRIAXONE 1 SENSITIVE Sensitive     ERYTHROMYCIN <=0.12 SENSITIVE Sensitive     LEVOFLOXACIN 0.5 SENSITIVE Sensitive     VANCOMYCIN 0.5 SENSITIVE Sensitive     * STREPTOCOCCUS CONSTELLATUS  Respiratory Panel by RT PCR (Flu A&B, Covid) - Nasopharyngeal Swab     Status: None   Collection Time: 12/18/19 11:05 PM   Specimen: Nasopharyngeal Swab  Result Value Ref Range Status   SARS Coronavirus 2 by RT PCR NEGATIVE NEGATIVE Final    Comment: (NOTE) SARS-CoV-2 target nucleic acids are NOT DETECTED. The SARS-CoV-2 RNA is generally detectable in upper respiratoy specimens during the acute phase of infection. The lowest concentration of SARS-CoV-2 viral copies this assay can detect is 131 copies/mL. A negative result does not preclude SARS-Cov-2 infection and should not be used as the sole basis for treatment or other patient management decisions. A negative result may occur with  improper specimen collection/handling, submission of specimen other than nasopharyngeal swab, presence of viral mutation(s) within the areas targeted by this assay, and inadequate number of viral copies (<131 copies/mL). A negative result must be combined with clinical observations, patient history, and epidemiological information. The expected result is Negative. Fact Sheet for Patients:   https://www.moore.com/ Fact Sheet for Healthcare Providers:  https://www.young.biz/ This test is not yet ap proved or cleared by the [place] FDA and  has been authorized for detection and/or diagnosis of SARS-CoV-2 by FDA under an Emergency Use Authorization (EUA). This EUA will remain  in effect (meaning this test can be used) for the duration of the COVID-19 declaration under Section 564(b)(1) of the Act, 21 U.S.C. section 360bbb-3(b)(1), unless the authorization is terminated or revoked sooner.    Influenza A by PCR NEGATIVE NEGATIVE Final   Influenza B by PCR NEGATIVE NEGATIVE Final    Comment: (NOTE) The Xpert Xpress SARS-CoV-2/FLU/RSV assay is intended as an aid in  the diagnosis of influenza from Nasopharyngeal swab specimens and  should not be used as a sole basis for treatment. Nasal washings and  aspirates are unacceptable for Xpert Xpress SARS-CoV-2/FLU/RSV  testing. Fact Sheet for Patients: https://www.moore.com/ Fact Sheet for Healthcare Providers: https://www.young.biz/ This test is not yet approved or cleared by the [place] FDA and  has been authorized for detection and/or diagnosis of SARS-CoV-2 by  FDA under an Emergency Use Authorization (EUA). This EUA will remain  in effect (meaning this test can be used) for the duration of the  Covid-19 declaration under Section 564(b)(1) of the Act, 21  U.S.C. section 360bbb-3(b)(1), unless the authorization is  terminated or revoked. Performed at [facility], [address] [place], [place] [address]   Blood culture (routine x 2)     Status: None   Collection Time: 12/19/19  3:31 AM   Specimen: BLOOD RIGHT WRIST  Result Value Ref Range Status   Specimen Description BLOOD RIGHT WRIST  Final   Special Requests   Final    BOTTLES DRAWN AEROBIC AND ANAEROBIC Blood Culture adequate volume   Culture   Final    NO GROWTH 5  DAYS Performed at [facility], [address] [place], [place] [address]    Report Status 12/24/2019 FINAL  Final  MRSA PCR Screening     Status: None   Collection Time: 12/19/19  6:16 AM   Specimen: Nasal Mucosa; Nasopharyngeal  Result Value Ref Range Status   MRSA by PCR NEGATIVE NEGATIVE Final    Comment:  The GeneXpert MRSA Assay (FDA approved for NASAL specimens only), is one component of a comprehensive MRSA colonization surveillance program. It is not intended to diagnose MRSA infection nor to guide or monitor treatment for MRSA infections. Performed at [facility], [place] [address] [place], [place] [address]   Culture, blood (routine x 2)     Status: None   Collection Time: 12/20/19  6:29 PM   Specimen: BLOOD RIGHT WRIST  Result Value Ref Range Status   Specimen Description BLOOD RIGHT WRIST  Final   Special Requests   Final    BOTTLES DRAWN AEROBIC AND ANAEROBIC Blood Culture adequate volume   Culture   Final    NO GROWTH 5 DAYS Performed at [facility], [place] [address], [place], [place] [address]    Report Status 12/25/2019 FINAL  Final  Culture, blood (routine x 2)     Status: None   Collection Time: 12/20/19  6:45 PM   Specimen: BLOOD RIGHT HAND  Result Value Ref Range Status   Specimen Description BLOOD RIGHT HAND  Final   Special Requests   Final    BOTTLES DRAWN AEROBIC AND ANAEROBIC Blood Culture adequate volume   Culture   Final    NO GROWTH 5 DAYS Performed at [facility], [place] [address], [place], [place] [address]    Report Status 12/25/2019 FINAL  Final      Radiology Studies: No results found.   Scheduled Meds: . sodium chloride   Intravenous Once  . Chlorhexidine Gluconate Cloth  6 each Topical Daily  . loratadine  10 mg Oral Daily  . multivitamin with minerals  1 tablet Oral Daily  . pantoprazole (PROTONIX) IV  40 mg Intravenous Q12H   Continuous Infusions: . sodium chloride 10 mL/hr at 12/21/19 1600   . cefTRIAXone (ROCEPHIN)  IV 2 g (12/27/19 2117)  . heparin 1,000 Units/hr (12/28/19 0713)     LOS: 9 days   Time Spent in minutes   30 minutes  Najib Colmenares D.O. on 12/28/2019 at 10:38 AM  Between 7am to 7pm - Please see pager noted on [email or web address]  After 7pm go to [email or web address]  And look for the night coverage person covering for me after hours  [facility] Office  [phone]

## 2019-12-28 NOTE — Progress Notes (Signed)
Inpatient Rehabilitation-Admissions Coordinator   I have received insurance approval for admit to CIR. However, the insurance company is unable to guarantee that she could be approved for return to CIR AFTER her decompression surgery. As her decompression surgery may be within the next week, we will hold off on admit to CIR at this time and will reopen her insurance case after surgery for possible admit. Discussed with PM&R MD, Dr. Berline Chough, who agrees an admission post surgery would be more beneficial to pt recovery than prior to surgery as insurance may only approve one rehab stay.   Dr. Catha Gosselin aware of plan. Will continue to follow.  Pt and her husband also aware of plan and in agreement.   Cheri Rous, OTR/L  Rehab Admissions Coordinator  [phone] 12/28/2019 1:21 PM

## 2019-12-29 ENCOUNTER — Other Ambulatory Visit: Payer: Self-pay | Admitting: Neurosurgery

## 2019-12-29 ENCOUNTER — Encounter (HOSPITAL_COMMUNITY): Payer: Self-pay | Admitting: Pulmonary Disease

## 2019-12-29 LAB — TYPE AND SCREEN
ABO/RH(D): B POS
Antibody Screen: NEGATIVE
Unit division: 0
Unit division: 0
Unit division: 0

## 2019-12-29 LAB — BASIC METABOLIC PANEL
Anion gap: 10 (ref 5–15)
BUN: 11 mg/dL (ref 6–20)
CO2: 26 mmol/L (ref 22–32)
Calcium: 9.1 mg/dL (ref 8.9–10.3)
Chloride: 107 mmol/L (ref 98–111)
Creatinine, Ser: 0.77 mg/dL (ref 0.44–1.00)
GFR calc Af Amer: >60 mL/min (ref >60)
GFR calc non Af Amer: >60 mL/min (ref >60)
Glucose, Bld: 99 mg/dL (ref 70–99)
Potassium: 3.8 mmol/L (ref 3.5–5.1)
Sodium: 143 mmol/L (ref 135–145)

## 2019-12-29 LAB — CBC
HCT: 32.2 % — ABNORMAL LOW (ref 36.0–46.0)
Hemoglobin: 10.3 g/dL — ABNORMAL LOW (ref 12.0–15.0)
MCH: 30.6 pg (ref 26.0–34.0)
MCHC: 32 g/dL (ref 30.0–36.0)
MCV: 95.5 fL (ref 80.0–100.0)
Platelets: 356 10*3/uL (ref 150–400)
RBC: 3.37 MIL/uL — ABNORMAL LOW (ref 3.87–5.11)
RDW: 15.4 % (ref 11.5–15.5)
WBC: 8 10*3/uL (ref 4.0–10.5)
nRBC: 0 % (ref 0.0–0.2)

## 2019-12-29 LAB — BPAM RBC
Blood Product Expiration Date: 202103222359
Blood Product Expiration Date: 202103242359
Blood Product Expiration Date: 202103292359
ISSUE DATE / TIME: 202103061113
ISSUE DATE / TIME: 202103061522
Unit Type and Rh: 7300
Unit Type and Rh: 7300
Unit Type and Rh: 7300

## 2019-12-29 LAB — HEPARIN LEVEL (UNFRACTIONATED)
Heparin Unfractionated: 0.77 IU/mL — ABNORMAL HIGH (ref 0.30–0.70)
Heparin Unfractionated: 0.55 IU/mL (ref 0.30–0.70)

## 2019-12-29 LAB — H. PYLORI ANTIBODY, IGG: H Pylori IgG: 7.92 Index Value — ABNORMAL HIGH (ref 0.00–0.79)

## 2019-12-29 MED ORDER — PANTOPRAZOLE SODIUM 40 MG PO TBEC
40.0000 mg | DELAYED_RELEASE_TABLET | Freq: Two times a day (BID) | ORAL | Status: DC
  Administered 2019-12-29: 40 mg via ORAL
  Filled 2019-12-29: qty 1

## 2019-12-29 NOTE — Progress Notes (Signed)
PROGRESS NOTE    Teonna Coonan  IOE:[id number] DOB: 14-Jul-1966 DOA: 12/18/2019 PCP: London Pepper, MD     Brief Narrative:  Anna Campos is a 54 year old female with no significant past medical history presenting with syncope.  She has been having generalized fatigue.  She has had poor p.o. intake and has not eaten in the last day.  She stood up this evening and passed out.  Family heard her fall.  She did hit her head.  She was told she was not unconscious for a long period of time.  She was noted by EMS to be hypotensive and she was given a small fluid bolus.  She was admitted to ICU due to syncope, hypovolemic shock.  Due to EKG changes, cardiology was consulted.  Patient was weaned off Levophed and transferred to [facility] on 3/1.  She was found to have Streptococcus bacteremia.  Infectious disease consulted and recommended 2 weeks of ceftriaxone.  Due to bacteremia, dentistry was also consulted.  Patient developed anemia during her hospitalization, GI consulted and patient underwent EGD on 3/8.  New events last 24 hours / Subjective: In good spirits this morning without any acute complaints.  Overall her strength is slowly improving with physical therapy.  Assessment & Plan:   Active Problems:   Syncope   Quadriplegia (HCC)   Hypotension   Bacteremia   Poor dentition   Spinal stenosis of cervical region   Hypovolemic shock and syncope -Admitted to ICU and now weaned off of Levophed.  Cortisol 23.5 -Appreciate cardiology; EKG shows nonspecific findings and pretest probability for CAD is low.  Troponin negative.  Echocardiogram with normal LVEF, normal RV size and function without valvular abnormalities.  No sign of right heart strain. No further cardiac work-up recommended -BP now stable  Strep constellatus bacteremia -ID consulted -Repeat blood cultures 3/1 negative -Continue rocephin for 2 weeks with start date 12/19/2019-01/01/2020  Dental caries -CT  maxillofacial on 12/18/2019 without mention of any dental caries or abscess -Orthopantogram on 12/24/2019 showed numerous dental caries.  Mild lucency around many of the remaining lower teeth. -Patient to undergo dental extraction planned 3/11 with Dr. Lawana Chambers  PE -CTA chest: Small nonocclusive pulmonary emboli bilaterally involving segmental and subsegmental sized vessels -DVT US negative -IV heparin.  Plan to transition to [place] prior to discharge once all surgical intervention is complete  Bilateral extremity weakness with incomplete spinal cord injury C3-C6 -Distal lower extremity weaker compared to proximal -Neurosurgery following, tentative surgical multilevel cervical decompressive procedure planned next week -CIR post op  Acute normocytic anemia/GI bleed/acute blood loss anemia -Hemoglobin dropped to 6.8  (see on admission hemoglobin approximately 9-10) -Denies hematuria or hematochezia, melena. FOBT positive -Anemia panel showed adequate iron and storage -Gastroenterology consulted and appreciated -Transfused 2u PRBC on 3/6 -Status post EGD on 12/27/2019 showing mild erythematous mucosa in the entire gastric cavity.  2 superficial prepyloric ulcers 1 with clean base the other with pigmentation.  1 Endo Clip was deployed to close the mucosal defect.  Erythematous duodenal bulb.  PPI BID recommended for 2 months.  Okay to restart IV heparin. Follow-up on H. pylori serology -Hemoglobin remains stable  DVT prophylaxis: IV heparin Code Status: Full code Family Communication: None at bedside  Disposition Plan:  . Patient is from home prior to admission. . Currently in-hospital treatment needed due to surgical intervention planned 3/11 as well as neurosurgery planned next week . Suspect patient will discharge CIR next week   Consultants:   PCCM admission  Cardiology  ID   Neurosurgery   Dentistry  Procedures:   EGD 3/8  Antimicrobials:  Anti-infectives (From admission,  onward)   Start     Dose/Rate Route Frequency Ordered Stop   12/21/19 1000  cefTRIAXone (ROCEPHIN) 2 g in sodium chloride 0.9 % 100 mL IVPB     2 g 200 mL/hr over 30 Minutes Intravenous Every 24 hours 12/21/19 0948 01/02/20 2359   12/20/19 2130  vancomycin (VANCOREADY) IVPB 750 mg/150 mL  Status:  Discontinued     750 mg 150 mL/hr over 60 Minutes Intravenous Every 12 hours 12/20/19 1825 12/21/19 0948   12/20/19 1530  ceFAZolin (ANCEF) IVPB 2g/100 mL premix  Status:  Discontinued     2 g 200 mL/hr over 30 Minutes Intravenous Every 8 hours 12/20/19 1526 12/20/19 1814   12/20/19 1000  vancomycin (VANCOREADY) IVPB 750 mg/150 mL  Status:  Discontinued     750 mg 150 mL/hr over 60 Minutes Intravenous Every 12 hours 12/20/19 0054 12/20/19 1019   12/20/19 0130  vancomycin (VANCOREADY) IVPB 1250 mg/250 mL     1,250 mg 166.7 mL/hr over 90 Minutes Intravenous  Once 12/20/19 0054 12/20/19 0356       Objective: Vitals:   12/28/19 1401 12/28/19 1616 12/28/19 2000 12/29/19 0554  BP: (!) 136/101 104/68 (!) 101/58 118/69  Pulse: (!) 54 (!) 57 80 64  Resp: 18 16 18 18   Temp: 97.7 F (36.5 C) 98.3 F (36.8 C) 98.4 F (36.9 C) 97.6 F (36.4 C)  TempSrc: Oral Oral Oral Oral  SpO2: 96% 100% 99% 99%  Weight:    58.5 kg  Height:        Intake/Output Summary (Last 24 hours) at 12/29/2019 1055 Last data filed at 12/29/2019 0600 Gross per 24 hour  Intake 1839.85 ml  Output 1550 ml  Net 289.85 ml   Filed Weights   12/27/19 0312 12/28/19 0501 12/29/19 0554  Weight: 60.1 kg 57.4 kg 58.5 kg    Examination: General exam: Appears calm and comfortable  Respiratory system: Clear to auscultation. Respiratory effort normal.  On room air Cardiovascular system: S1 & S2 heard, RRR. No pedal edema. Gastrointestinal system: Abdomen is nondistended, soft and nontender. Normal bowel sounds heard. Central nervous system: Alert and oriented.  Extremity weakness worse distal compared to proximal Extremities:  Symmetric in appearance bilaterally  Psychiatry: Judgement and insight appear stable. Mood & affect appropriate.    Data Reviewed: I have personally reviewed following labs and imaging studies  CBC: Recent Labs  Lab 12/25/19 0444 12/25/19 0444 12/25/19 1949 12/26/19 0405 12/27/19 0418 12/28/19 0453 12/29/19 0418  WBC 8.5  --   --  10.4 10.5 10.4 8.0  HGB 6.8*   < > 10.5* 10.3* 10.3* 10.7* 10.3*  HCT 21.2*   < > 31.8* 30.6* 32.4* 32.9* 32.2*  MCV 93.0  --   --  89.0 93.1 92.4 95.5  PLT 234  --   --  242 287 320 356   < > = values in this interval not displayed.   Basic Metabolic Panel: Recent Labs  Lab 12/25/19 0444 12/26/19 0405 12/27/19 0418 12/29/19 0418  NA 141 142 141 143  K 3.8 3.7 3.8 3.8  CL 107 109 108 107  CO2 24 26 23 26   GLUCOSE 87 92 88 99  BUN 13 9 11 11   CREATININE 0.68 0.76 0.84 0.77  CALCIUM 8.5* 8.7* 8.7* 9.1   GFR: Estimated Creatinine Clearance: 76 mL/min (by C-G formula based on  SCr of 0.77 mg/dL). Liver Function Tests: No results for input(s): AST, ALT, ALKPHOS, BILITOT, PROT, ALBUMIN in the last 168 hours. No results for input(s): LIPASE, AMYLASE in the last 168 hours. No results for input(s): AMMONIA in the last 168 hours. Coagulation Profile: No results for input(s): INR, PROTIME in the last 168 hours. Cardiac Enzymes: No results for input(s): CKTOTAL, CKMB, CKMBINDEX, TROPONINI in the last 168 hours. BNP (last 3 results) No results for input(s): PROBNP in the last 8760 hours. HbA1C: No results for input(s): HGBA1C in the last 72 hours. CBG: No results for input(s): GLUCAP in the last 168 hours. Lipid Profile: No results for input(s): CHOL, HDL, LDLCALC, TRIG, CHOLHDL, LDLDIRECT in the last 72 hours. Thyroid Function Tests: No results for input(s): TSH, T4TOTAL, FREET4, T3FREE, THYROIDAB in the last 72 hours. Anemia Panel: No results for input(s): VITAMINB12, FOLATE, FERRITIN, TIBC, IRON, RETICCTPCT in the last 72 hours. Sepsis  Labs: No results for input(s): PROCALCITON, LATICACIDVEN in the last 168 hours.  Recent Results (from the past 240 hour(s))  Culture, blood (routine x 2)     Status: None   Collection Time: 12/20/19  6:29 PM   Specimen: BLOOD RIGHT WRIST  Result Value Ref Range Status   Specimen Description BLOOD RIGHT WRIST  Final   Special Requests   Final    BOTTLES DRAWN AEROBIC AND ANAEROBIC Blood Culture adequate volume   Culture   Final    NO GROWTH 5 DAYS Performed at [facility], [address], [place], [place] [address]    Report Status 12/25/2019 FINAL  Final  Culture, blood (routine x 2)     Status: None   Collection Time: 12/20/19  6:45 PM   Specimen: BLOOD RIGHT HAND  Result Value Ref Range Status   Specimen Description BLOOD RIGHT HAND  Final   Special Requests   Final    BOTTLES DRAWN AEROBIC AND ANAEROBIC Blood Culture adequate volume   Culture   Final    NO GROWTH 5 DAYS Performed at [facility], [address], [place], [place] [address]    Report Status 12/25/2019 FINAL  Final      Radiology Studies: No results found.    Scheduled Meds: . sodium chloride   Intravenous Once  . Chlorhexidine Gluconate Cloth  6 each Topical Daily  . loratadine  10 mg Oral Daily  . multivitamin with minerals  1 tablet Oral Daily  . pantoprazole  40 mg Oral BID   Continuous Infusions: . sodium chloride 10 mL/hr at 12/21/19 1600  . cefTRIAXone (ROCEPHIN)  IV Stopped (12/28/19 2313)  . heparin 900 Units/hr (12/29/19 0853)     LOS: 10 days      Time spent:35 minutes   Noralee Stain, DO [facility] 12/29/2019, 10:55 AM   Available via Epic secure chat 7am-7pm After these hours, please refer to coverage provider listed on [email or web address]

## 2019-12-29 NOTE — Progress Notes (Signed)
Occupational Therapy Treatment Patient Details Name: Anna Campos MRN: [id number] DOB: [birth year] Today's Date: 12/29/2019    History of present illness 54 year old woman with no significant past medical history presenting with syncope and s/p fall. Pt found to have incomplete spincal cord injury, bacteremia, and bilat PEs. N/S following, plan for surgery in 2 weeks.   OT comments  Pt continues to progress towards OT goals this session, VERY motivated, Pt was eating grapes utilizing pincher grasp (Pt naturally right handed) when PT/OT entered and operating smart phone to a certain extent. MOd A +2 for transfers with 2 person HHA lift and use of stedy. Current POC remains appropriate and essential to maximize safety and independence in ADL and functional transfers.    Follow Up Recommendations  CIR    Equipment Recommendations  3 in 1 bedside commode;Other (comment)(defer to next venue of care)    Recommendations for Other Services      Precautions / Restrictions Precautions Precautions: Fall Precaution Comments: central cord Restrictions Weight Bearing Restrictions: No       Mobility Bed Mobility Overal bed mobility: Needs Assistance Bed Mobility: Rolling;Sidelying to Sit Rolling: Mod assist;+2 for physical assistance Sidelying to sit: Mod assist;+2 for physical assistance       General bed mobility comments: assist for bilat knee flexion and at pelvis to roll R and L; pt able to reach across to bilat bedrails to assist in rolling; cues for sequencing; after using bed pan pt in side lying and assist at trunk and bilat LE to get into sitting EOB  Transfers Overall transfer level: Needs assistance Equipment used: (2 person lift with gait belt) Transfers: Sit to/from Stand Sit to Stand: +2 physical assistance;Mod assist         General transfer comment: pt stood using Stedy standing frame x2 given pt's knees buckle if not locked out in extension; +2 to power up into  standing, assist to maintain bilat hand grip, and tactile cues for flexing LE to sit     Balance Overall balance assessment: Needs assistance Sitting-balance support: Feet supported Sitting balance-Leahy Scale: Fair Sitting balance - Comments: fair for static sitting balance; pt could not tolerated challenges without assistance to maintain balance EOB   Standing balance support: During functional activity;Bilateral upper extremity supported Standing balance-Leahy Scale: Poor                             ADL either performed or assessed with clinical judgement   ADL Overall ADL's : Needs assistance/impaired     Grooming: Maximal assistance;Sitting Grooming Details (indicate cue type and reason): able to bring hand to face, but required assist with anything weighted in hand                 Toilet Transfer: Moderate assistance;+2 for physical assistance;+2 for safety/equipment(sit<>stand and use of STEDY)   Toileting- Clothing Manipulation and Hygiene: Total assistance;+2 for safety/equipment;+2 for physical assistance;Bed level Toileting - Clothing Manipulation Details (indicate cue type and reason): peri care performed in supine at bed level and also in stedy with sit<>stand     Functional mobility during ADLs: Moderate assistance;+2 for physical assistance;+2 for safety/equipment(use of stedy this session) General ADL Comments: Pt movement and functional performance improved with single step cues for education purposes (eg "bend knee up before rolling")      Vision       Perception     Praxis      Cognition  Arousal/Alertness: Awake/alert Behavior During Therapy: WFL for tasks assessed/performed Overall Cognitive Status: Within Functional Limits for tasks assessed                                          Exercises     Shoulder Instructions       General Comments VSS on RA    Pertinent Vitals/ Pain       Pain Assessment:  Faces Faces Pain Scale: Hurts a little bit Pain Location: spasms in bilat LE (mostly L hip) but otherwise no pain Pain Descriptors / Indicators: Guarding;Grimacing;Moaning;Spasm Pain Intervention(s): Monitored during session;Repositioned  Home Living                                          Prior Functioning/Environment              Frequency  Min 3X/week        Progress Toward Goals  OT Goals(current goals can now be found in the care plan section)  Progress towards OT goals: Progressing toward goals  Acute Rehab OT Goals Patient Stated Goal: "I want to walk again" OT Goal Formulation: With patient Time For Goal Achievement: 01/03/20 Potential to Achieve Goals: Good  Plan Discharge plan remains appropriate    Co-evaluation    PT/OT/SLP Co-Evaluation/Treatment: Yes Reason for Co-Treatment: Complexity of the patient's impairments (multi-system involvement);For patient/therapist safety;To address functional/ADL transfers PT goals addressed during session: Mobility/safety with mobility;Balance;Strengthening/ROM OT goals addressed during session: ADL's and self-care;Strengthening/ROM      AM-PAC OT "6 Clicks" Daily Activity     Outcome Measure   Help from another person eating meals?: A Lot Help from another person taking care of personal grooming?: A Lot Help from another person toileting, which includes using toliet, bedpan, or urinal?: A Lot Help from another person bathing (including washing, rinsing, drying)?: A Lot Help from another person to put on and taking off regular upper body clothing?: A Lot Help from another person to put on and taking off regular lower body clothing?: Total 6 Click Score: 11    End of Session Equipment Utilized During Treatment: Gait belt(STEDY)  OT Visit Diagnosis: Unsteadiness on feet (R26.81);Muscle weakness (generalized) (M62.81)   Activity Tolerance Patient tolerated treatment well   Patient Left in  bed;with bed alarm set;with call bell/phone within reach   Nurse Communication Mobility status        Time: 6195-0932 OT Time Calculation (min): 25 min  Charges: OT General Charges $OT Visit: 1 Visit OT Treatments $Self Care/Home Management : 8-22 mins $Therapeutic Activity: 8-22 mins  Nyoka Cowden OTR/L Acute Rehabilitation Services Pager: [phone] Office: [phone]   Evern Bio Zayanna Pundt 12/29/2019, 4:29 PM

## 2019-12-29 NOTE — Progress Notes (Signed)
Physical Therapy Treatment Patient Details Name: Anna Campos MRN: [id number] DOB: 03-Feb-1966 Today's Date: 12/29/2019    History of Present Illness 54 year old woman with no significant past medical history presenting with syncope and s/p fall. Pt found to have incomplete spincal cord injury, bacteremia, and bilat PEs. N/S following, plan for surgery in 2 weeks.    PT Comments    Patient continues to make progress toward PT goals and remains a great candidate for CIR level therapies.    Follow Up Recommendations  CIR     Equipment Recommendations  Wheelchair (measurements PT);Wheelchair cushion (measurements PT)    Recommendations for Other Services Rehab consult     Precautions / Restrictions Precautions Precautions: Fall Precaution Comments: central cord Restrictions Weight Bearing Restrictions: No    Mobility  Bed Mobility Overal bed mobility: Needs Assistance Bed Mobility: Rolling;Sidelying to Sit Rolling: Mod assist;+2 for physical assistance Sidelying to sit: Mod assist;+2 for physical assistance       General bed mobility comments: assist for bilat knee flexion and at pelvis to roll R and L; pt able to reach across to bilat bedrails to assist in rolling; cues for sequencing; after using bed pan pt in side lying and assist at trunk and bilat LE to get into sitting EOB  Transfers Overall transfer level: Needs assistance Equipment used: (2 person lift with gait belt); Stedy  Transfers: Sit to/from [employer] to Stand: +2 physical assistance;Mod assist         General transfer comment: pt stood several times using Stedy standing frame given pt's knees buckle if not locked out in extension; +2 to power up into standing, assist to maintain bilat hand grip, and tactile cues for flexing LE to sit   Ambulation/Gait             General Gait Details: unable at this time   Stairs             Wheelchair Mobility    Modified Rankin (Stroke Patients  Only)       Balance Overall balance assessment: Needs assistance Sitting-balance support: Feet supported Sitting balance-Leahy Scale: Fair Sitting balance - Comments: fair for static sitting balance; pt could not tolerated challenges without assistance to maintain balance EOB   Standing balance support: During functional activity;Bilateral upper extremity supported Standing balance-Leahy Scale: Poor                              Cognition Arousal/Alertness: Awake/alert Behavior During Therapy: WFL for tasks assessed/performed Overall Cognitive Status: Within Functional Limits for tasks assessed                                        Exercises      General Comments General comments (skin integrity, edema, etc.): VSS on RA       Pertinent Vitals/Pain Pain Assessment: Faces Faces Pain Scale: Hurts whole lot Pain Location: spasms in bilat LE (mostly L hip) but otherwise no pain Pain Descriptors / Indicators: Guarding;Grimacing;Moaning;Spasm Pain Intervention(s): Monitored during session;Repositioned;Patient requesting pain meds-RN notified;RN gave pain meds during session    Home Living                      Prior Function            PT Goals (current goals can now be found in  the care plan section) Progress towards PT goals: Progressing toward goals    Frequency    Min 4X/week      PT Plan Current plan remains appropriate    Co-evaluation PT/OT/SLP Co-Evaluation/Treatment: Yes Reason for Co-Treatment: Complexity of the patient's impairments (multi-system involvement);For patient/therapist safety;To address functional/ADL transfers PT goals addressed during session: Mobility/safety with mobility;Balance        AM-PAC PT "6 Clicks" Mobility   Outcome Measure  Help needed turning from your back to your side while in a flat bed without using bedrails?: A Lot Help needed moving from lying on your back to sitting on the side  of a flat bed without using bedrails?: A Lot Help needed moving to and from a bed to a chair (including a wheelchair)?: A Lot Help needed standing up from a chair using your arms (e.g., wheelchair or bedside chair)?: A Lot Help needed to walk in hospital room?: Total Help needed climbing 3-5 steps with a railing? : Total 6 Click Score: 10    End of Session Equipment Utilized During Treatment: Gait belt Activity Tolerance: Patient tolerated treatment well Patient left: in chair;with call bell/phone within reach;with chair alarm set;with family/visitor present Nurse Communication: Mobility status PT Visit Diagnosis: Unsteadiness on feet (R26.81);Other abnormalities of gait and mobility (R26.89);Repeated falls (R29.6);Muscle weakness (generalized) (M62.81);History of falling (Z91.81);Ataxic gait (R26.0);Difficulty in walking, not elsewhere classified (R26.2)     Time: 1110-1208 PT Time Calculation (min) (ACUTE ONLY): 58 min  Charges:  $Gait Training: 23-37 mins                     Earney Navy, PTA Acute Rehabilitation Services Pager: [phone] Office: [phone]     Darliss Cheney 12/29/2019, 2:57 PM

## 2019-12-29 NOTE — Progress Notes (Signed)
ANTICOAGULATION CONSULT NOTE - Follow Up Consult  Pharmacy Consult for heparin Indication: pulmonary embolus  Labs: Recent Labs    12/27/19 0418 12/27/19 2056 12/28/19 0453 12/29/19 0418 12/29/19 1237  HGB 10.3*  --  10.7* 10.3*  --   HCT 32.4*  --  32.9* 32.2*  --   PLT 287  --  320 356  --   HEPARINUNFRC 0.52   < > 0.39 0.77* 0.55  CREATININE 0.84  --   --  0.77  --    < > = values in this interval not displayed.    Assessment: 54yo female continues on heparin for pulmonary embolus Heparin level now therapeutic, to stop early 3/11 for dental surgery  Goal of Therapy:  Heparin level 0.3-0.7 units/ml   Plan:  Continue heparin at 900 units / hr Follow up after dental procedure tomorrow  Thank you Okey Regal, PharmD 12/29/2019,1:24 PM

## 2019-12-29 NOTE — Progress Notes (Signed)
Overall patient is making some slow steady progress.  She has better strength proximally in her upper extremities.  She is beginning to get a little bit of movement in both hands.  Her dysesthetic pain is also slowly improving.  Patient with significant residual myelopathy.  Incomplete spinal cord injury.  She is making slow steady improvement with rest and time alone.  I think improvement would be with surgical decompression however her overall medical conditions makes surgical intervention difficult.  Tentatively planning for surgery Monday or Tuesday next week.

## 2019-12-29 NOTE — Progress Notes (Signed)
Physical Therapy Treatment Patient Details Name: Anna Campos MRN: [id number] DOB: 05-22-1966 Today's Date: 12/29/2019    History of Present Illness 54 year old woman with no significant past medical history presenting with syncope and s/p fall. Pt found to have incomplete spincal cord injury, bacteremia, and bilat PEs. N/S following, plan for surgery in 2 weeks.    PT Comments    Patient seen second time for transfer training back to bed from recliner. Current plan remains appropriate.    Follow Up Recommendations  CIR     Equipment Recommendations  Wheelchair (measurements PT);Wheelchair cushion (measurements PT)    Recommendations for Other Services Rehab consult     Precautions / Restrictions Precautions Precautions: Fall Precaution Comments: central cord Restrictions Weight Bearing Restrictions: No    Mobility  Bed Mobility Overal bed mobility: Needs Assistance Bed Mobility: Sit to Sidelying;Rolling Rolling: Min assist Sidelying to sit: Mod assist;+2 for physical assistance     Sit to sidelying: Mod assist;+2 for physical assistance;+2 for safety/equipment General bed mobility comments: cues for sequencing and technique; pt assisted at trunk to lower onto L side and then assist to bring bilat LE into bed; pt able to roll trunk adn assist to position bilat LE; pt able to bridge to position in bed with assist to flex bilat LE and stabilize   Transfers Overall transfer level: Needs assistance Equipment used: (2 person lift with gait belt) Transfers: Sit to/from Stand Sit to Stand: +2 physical assistance;Mod assist         General transfer comment: pt stood using Stedy standing frame x2 given pt's knees buckle if not locked out in extension; +2 to power up into standing, assist to maintain bilat hand grip, and tactile cues for flexing LE to sit   Ambulation/Gait             General Gait Details: unable at this time   Stairs             Wheelchair  Mobility    Modified Rankin (Stroke Patients Only)       Balance Overall balance assessment: Needs assistance Sitting-balance support: Feet supported Sitting balance-Leahy Scale: Fair Sitting balance - Comments: fair for static sitting balance; pt could not tolerated challenges without assistance to maintain balance EOB   Standing balance support: During functional activity;Bilateral upper extremity supported Standing balance-Leahy Scale: Poor                              Cognition Arousal/Alertness: Awake/alert Behavior During Therapy: WFL for tasks assessed/performed Overall Cognitive Status: Within Functional Limits for tasks assessed                                        Exercises      General Comments General comments (skin integrity, edema, etc.): VSS on RA      Pertinent Vitals/Pain Pain Assessment: Faces Faces Pain Scale: Hurts a little bit Pain Location: spasms in bilat LE (mostly L hip) but otherwise no pain Pain Descriptors / Indicators: Guarding;Grimacing;Moaning;Spasm Pain Intervention(s): Limited activity within patient's tolerance;Monitored during session;Repositioned    Home Living                      Prior Function            PT Goals (current goals can now be found in the  care plan section) Acute Rehab PT Goals Patient Stated Goal: "I want to walk again" Progress towards PT goals: Progressing toward goals    Frequency    Min 4X/week      PT Plan Current plan remains appropriate    Co-evaluation PT/OT/SLP Co-Evaluation/Treatment: Yes Reason for Co-Treatment: Complexity of the patient's impairments (multi-system involvement);For patient/therapist safety;To address functional/ADL transfers PT goals addressed during session: Mobility/safety with mobility;Balance OT goals addressed during session: ADL's and self-care;Strengthening/ROM      AM-PAC PT "6 Clicks" Mobility   Outcome Measure  Help  needed turning from your back to your side while in a flat bed without using bedrails?: A Lot Help needed moving from lying on your back to sitting on the side of a flat bed without using bedrails?: A Lot Help needed moving to and from a bed to a chair (including a wheelchair)?: A Lot Help needed standing up from a chair using your arms (e.g., wheelchair or bedside chair)?: A Lot Help needed to walk in hospital room?: Total Help needed climbing 3-5 steps with a railing? : Total 6 Click Score: 10    End of Session Equipment Utilized During Treatment: Gait belt Activity Tolerance: Patient tolerated treatment well Patient left: with call bell/phone within reach;in bed Nurse Communication: Mobility status PT Visit Diagnosis: Unsteadiness on feet (R26.81);Other abnormalities of gait and mobility (R26.89);Repeated falls (R29.6);Muscle weakness (generalized) (M62.81);History of falling (Z91.81);Ataxic gait (R26.0);Difficulty in walking, not elsewhere classified (R26.2)     Time: 3716-9678 PT Time Calculation (min) (ACUTE ONLY): 25 min  Charges:  $Gait Training: 8-22 mins                     Earney Navy, PTA Acute Rehabilitation Services Pager: [phone] Office: [phone]     Darliss Cheney 12/29/2019, 4:56 PM

## 2019-12-29 NOTE — Progress Notes (Signed)
Occupational Therapy Treatment Patient Details Name: Anna Campos MRN: [id number] DOB: 1965-11-29 Today's Date: 12/29/2019    History of present illness 54 year old woman with no significant past medical history presenting with syncope and s/p fall. Pt found to have incomplete spincal cord injury, bacteremia, and bilat PEs. N/S following, plan for surgery in 2 weeks.   OT comments  Pt continues to progress towards OT goals this session. Improved movement in BUE with Pt able to bring hand to face for grooming activities with mod A, rolling in bed improved with sequencing cues at mod A +2 for peri care (total A) and use of stedy to perform multiple sit<>stand at mod A +2. CIR continues to be appropriate post-acute venue to maximize safety and independence in ADL and functional transfers. Pt's husband present throughout and helpful/interested in learning how to help wife more.    Follow Up Recommendations  CIR    Equipment Recommendations  3 in 1 bedside commode;Other (comment)(defer to next venue of care)    Recommendations for Other Services      Precautions / Restrictions Precautions Precautions: Fall Precaution Comments: central cord Restrictions Weight Bearing Restrictions: No       Mobility Bed Mobility Overal bed mobility: Needs Assistance Bed Mobility: Rolling;Sidelying to Sit Rolling: Mod assist;+2 for physical assistance Sidelying to sit: Mod assist;+2 for physical assistance       General bed mobility comments: assist for bilat knee flexion and at pelvis to roll R and L; pt able to reach across to bilat bedrails to assist in rolling; cues for sequencing; after using bed pan pt in side lying and assist at trunk and bilat LE to get into sitting EOB  Transfers Overall transfer level: Needs assistance Equipment used: (2 person lift with gait belt) Transfers: Sit to/from Stand Sit to Stand: +2 physical assistance;Mod assist         General transfer comment: pt stood  several times using Stedy standing frame given pt's knees buckle if not locked out in extension; +2 to power up into standing, assist to maintain bilat hand grip, and tactile cues for flexing LE to sit     Balance Overall balance assessment: Needs assistance Sitting-balance support: Feet supported Sitting balance-Leahy Scale: Fair Sitting balance - Comments: fair for static sitting balance; pt could not tolerated challenges without assistance to maintain balance EOB   Standing balance support: During functional activity;Bilateral upper extremity supported Standing balance-Leahy Scale: Poor                             ADL either performed or assessed with clinical judgement   ADL Overall ADL's : Needs assistance/impaired     Grooming: Maximal assistance;Sitting Grooming Details (indicate cue type and reason): able to bring hand to face, but required assist with anything weighted in hand                 Toilet Transfer: Moderate assistance;+2 for physical assistance;+2 for safety/equipment(sit<>stand and use of STEDY)   Toileting- Clothing Manipulation and Hygiene: Total assistance;+2 for safety/equipment;+2 for physical assistance;Sit to/from stand Toileting - Clothing Manipulation Details (indicate cue type and reason): peri care performed in supine at bed level and also in stedy with sit<>stand     Functional mobility during ADLs: (use of stedy this session) General ADL Comments: Pt movement and functional performance improved with single step cues for education purposes (eg "bend knee up before rolling")      Vision  Perception     Praxis      Cognition Arousal/Alertness: Awake/alert Behavior During Therapy: WFL for tasks assessed/performed Overall Cognitive Status: Within Functional Limits for tasks assessed                                          Exercises     Shoulder Instructions       General Comments VSS on RA     Pertinent Vitals/ Pain       Pain Assessment: Faces Faces Pain Scale: Hurts whole lot Pain Location: spasms in bilat LE (mostly L hip) but otherwise no pain Pain Descriptors / Indicators: Guarding;Grimacing;Moaning;Spasm Pain Intervention(s): Monitored during session;Repositioned;RN gave pain meds during session  Home Living                                          Prior Functioning/Environment              Frequency  Min 3X/week        Progress Toward Goals  OT Goals(current goals can now be found in the care plan section)  Progress towards OT goals: Progressing toward goals  Acute Rehab OT Goals Patient Stated Goal: "I want to walk again" OT Goal Formulation: With patient Time For Goal Achievement: 01/03/20 Potential to Achieve Goals: Good  Plan Discharge plan remains appropriate    Co-evaluation    PT/OT/SLP Co-Evaluation/Treatment: Yes Reason for Co-Treatment: Complexity of the patient's impairments (multi-system involvement);For patient/therapist safety;To address functional/ADL transfers PT goals addressed during session: Mobility/safety with mobility;Balance;Proper use of DME;Strengthening/ROM OT goals addressed during session: ADL's and self-care;Strengthening/ROM      AM-PAC OT "6 Clicks" Daily Activity     Outcome Measure   Help from another person eating meals?: A Lot Help from another person taking care of personal grooming?: A Lot Help from another person toileting, which includes using toliet, bedpan, or urinal?: A Lot Help from another person bathing (including washing, rinsing, drying)?: A Lot Help from another person to put on and taking off regular upper body clothing?: A Lot Help from another person to put on and taking off regular lower body clothing?: Total 6 Click Score: 11    End of Session Equipment Utilized During Treatment: Gait belt(STEDY)  OT Visit Diagnosis: Unsteadiness on feet (R26.81);Muscle weakness  (generalized) (M62.81)   Activity Tolerance Patient tolerated treatment well   Patient Left in chair;with call bell/phone within reach;with chair alarm set;with family/visitor present   Nurse Communication Mobility status(needs purewick replaced, had a BM during session)        Time: 1110-1208 OT Time Calculation (min): 58 min  Charges: OT General Charges $OT Visit: 1 Visit OT Treatments $Self Care/Home Management : 8-22 mins $Therapeutic Activity: 8-22 mins  Jesse Sans OTR/L Acute Rehabilitation Services Pager: [phone] Office: [place] 12/29/2019, 3:47 PM

## 2019-12-29 NOTE — Progress Notes (Signed)
ANTICOAGULATION CONSULT NOTE - Follow Up Consult  Pharmacy Consult for heparin Indication: pulmonary embolus  Labs: Recent Labs    12/27/19 0418 12/27/19 0418 12/27/19 2056 12/28/19 0453 12/29/19 0418  HGB 10.3*   < >  --  10.7* 10.3*  HCT 32.4*  --   --  32.9* 32.2*  PLT 287  --   --  320 356  HEPARINUNFRC 0.52   < > 0.23* 0.39 0.77*  CREATININE 0.84  --   --   --  0.77   < > = values in this interval not displayed.    Assessment: 53yo female supratherapeutic on heparin after one level at goal; no gtt issues or signs of bleeding per RN.  Goal of Therapy:  Heparin level 0.3-0.7 units/ml   Plan:  Will decrease heparin gtt by 1-2 units/kg/hr to 900 units/hr and check level in 6 hours.    Vernard Gambles, PharmD, BCPS  12/29/2019,5:38 AM

## 2019-12-29 NOTE — Anesthesia Preprocedure Evaluation (Addendum)
Anesthesia Evaluation  Patient identified by MRN, date of birth, ID band Patient awake    Reviewed: Allergy & Precautions, NPO status , Patient's Chart, lab work & pertinent test results  Airway Mallampati: II  TM Distance: >3 FB Neck ROM: Full    Dental  (+) Poor Dentition, Loose, Missing, Chipped   Pulmonary former smoker, PE Bilateral PTE  12/20/19   Pulmonary exam normal breath sounds clear to auscultation       Cardiovascular negative cardio ROS Normal cardiovascular exam Rhythm:Regular Rate:Normal     Neuro/Psych negative neurological ROS  negative psych ROS   GI/Hepatic Neg liver ROS, bacteremia with chronic peridontitis   Endo/Other  negative endocrine ROS  Renal/GU negative Renal ROS  negative genitourinary   Musculoskeletal Cervical stenosis   Abdominal   Peds  Hematology  (+) anemia , Anticoagulated- on heparin gtt   Anesthesia Other Findings   Reproductive/Obstetrics                            Anesthesia Physical Anesthesia Plan  ASA: III  Anesthesia Plan: General   Post-op Pain Management:    Induction: Intravenous  PONV Risk Score and Plan: 4 or greater and Ondansetron, Dexamethasone and Treatment may vary due to age or medical condition  Airway Management Planned: Oral ETT  Additional Equipment:   Intra-op Plan:   Post-operative Plan: Extubation in OR  Informed Consent: I have reviewed the patients History and Physical, chart, labs and discussed the procedure including the risks, benefits and alternatives for the proposed anesthesia with the patient or authorized representative who has indicated his/her understanding and acceptance.     Dental advisory given  Plan Discussed with: CRNA and Surgeon  Anesthesia Plan Comments:        Anesthesia Quick Evaluation

## 2019-12-30 ENCOUNTER — Inpatient Hospital Stay (HOSPITAL_COMMUNITY): Payer: Commercial Managed Care - PPO | Admitting: Anesthesiology

## 2019-12-30 ENCOUNTER — Encounter (HOSPITAL_COMMUNITY)
Admission: EM | Disposition: A | Discharge status: Rehabilitation Facility | Payer: Self-pay | Source: Home / Self Care | Attending: Internal Medicine

## 2019-12-30 HISTORY — PX: MULTIPLE EXTRACTIONS WITH ALVEOLOPLASTY: SHX5342

## 2019-12-30 LAB — CBC
HCT: 33.3 % — ABNORMAL LOW (ref 36.0–46.0)
Hemoglobin: 10.6 g/dL — ABNORMAL LOW (ref 12.0–15.0)
MCH: 30.6 pg (ref 26.0–34.0)
MCHC: 31.8 g/dL (ref 30.0–36.0)
MCV: 96.2 fL (ref 80.0–100.0)
Platelets: 344 10*3/uL (ref 150–400)
RBC: 3.46 MIL/uL — ABNORMAL LOW (ref 3.87–5.11)
RDW: 15.3 % (ref 11.5–15.5)
WBC: 7.7 10*3/uL (ref 4.0–10.5)
nRBC: 0 % (ref 0.0–0.2)

## 2019-12-30 LAB — HEPARIN LEVEL (UNFRACTIONATED): Heparin Unfractionated: <0.1 IU/mL — ABNORMAL LOW (ref 0.30–0.70)

## 2019-12-30 SURGERY — MULTIPLE EXTRACTION WITH ALVEOLOPLASTY
Anesthesia: General

## 2019-12-30 MED ORDER — ONDANSETRON HCL 4 MG/2ML IJ SOLN
INTRAMUSCULAR | Status: AC
  Filled 2019-12-30: qty 2

## 2019-12-30 MED ORDER — LIDOCAINE-EPINEPHRINE 2 %-1:100000 IJ SOLN
INTRAMUSCULAR | Status: AC
  Filled 2019-12-30: qty 10.2

## 2019-12-30 MED ORDER — MIDAZOLAM HCL 5 MG/5ML IJ SOLN
INTRAMUSCULAR | Status: DC | PRN
  Administered 2019-12-30: 2 mg via INTRAVENOUS

## 2019-12-30 MED ORDER — ROCURONIUM BROMIDE 50 MG/5ML IV SOSY
PREFILLED_SYRINGE | INTRAVENOUS | Status: DC | PRN
  Administered 2019-12-30: 50 mg via INTRAVENOUS
  Administered 2019-12-30: 30 mg via INTRAVENOUS
  Administered 2019-12-30: 50 mg via INTRAVENOUS

## 2019-12-30 MED ORDER — DEXAMETHASONE SODIUM PHOSPHATE 10 MG/ML IJ SOLN
INTRAMUSCULAR | Status: AC
  Filled 2019-12-30: qty 1

## 2019-12-30 MED ORDER — PROPOFOL 10 MG/ML IV BOLUS
INTRAVENOUS | Status: DC | PRN
  Administered 2019-12-30: 140 mg via INTRAVENOUS

## 2019-12-30 MED ORDER — LACTATED RINGERS IV SOLN: INTRAVENOUS | Status: DC

## 2019-12-30 MED ORDER — PROPOFOL 10 MG/ML IV BOLUS
INTRAVENOUS | Status: AC
  Filled 2019-12-30: qty 40

## 2019-12-30 MED ORDER — LACTATED RINGERS IV SOLN: INTRAVENOUS | Status: DC | PRN

## 2019-12-30 MED ORDER — PANTOPRAZOLE SODIUM 40 MG PO TBEC
40.0000 mg | DELAYED_RELEASE_TABLET | Freq: Two times a day (BID) | ORAL | Status: DC
  Administered 2019-12-30 – 2020-01-11 (×22): 40 mg via ORAL
  Filled 2019-12-30 (×23): qty 1

## 2019-12-30 MED ORDER — THROMBIN 5000 UNITS EX SOLR
CUTANEOUS | Status: AC
  Filled 2019-12-30: qty 5000

## 2019-12-30 MED ORDER — DEXAMETHASONE SODIUM PHOSPHATE 10 MG/ML IJ SOLN
INTRAMUSCULAR | Status: DC | PRN
  Administered 2019-12-30: 10 mg via INTRAVENOUS

## 2019-12-30 MED ORDER — 0.9 % SODIUM CHLORIDE (POUR BTL) OPTIME
TOPICAL | Status: DC | PRN
  Administered 2019-12-30: 1000 mL

## 2019-12-30 MED ORDER — ONDANSETRON HCL 4 MG/2ML IJ SOLN
INTRAMUSCULAR | Status: DC | PRN
  Administered 2019-12-30: 4 mg via INTRAVENOUS

## 2019-12-30 MED ORDER — SUGAMMADEX SODIUM 200 MG/2ML IV SOLN
INTRAVENOUS | Status: DC | PRN
  Administered 2019-12-30: 150 mg via INTRAVENOUS

## 2019-12-30 MED ORDER — LIDOCAINE 2% (20 MG/ML) 5 ML SYRINGE
INTRAMUSCULAR | Status: AC
  Filled 2019-12-30: qty 5

## 2019-12-30 MED ORDER — CLARITHROMYCIN 500 MG PO TABS
500.0000 mg | ORAL_TABLET | Freq: Two times a day (BID) | ORAL | Status: DC
  Administered 2019-12-30 – 2020-01-11 (×24): 500 mg via ORAL
  Filled 2019-12-30 (×28): qty 1

## 2019-12-30 MED ORDER — THROMBIN 5000 UNITS EX SOLR
CUTANEOUS | Status: DC | PRN
  Administered 2019-12-30: 5000 [IU] via TOPICAL

## 2019-12-30 MED ORDER — FENTANYL CITRATE (PF) 250 MCG/5ML IJ SOLN
INTRAMUSCULAR | Status: AC
  Filled 2019-12-30: qty 5

## 2019-12-30 MED ORDER — MIDAZOLAM HCL 2 MG/2ML IJ SOLN
INTRAMUSCULAR | Status: AC
  Filled 2019-12-30: qty 2

## 2019-12-30 MED ORDER — ROCURONIUM BROMIDE 10 MG/ML (PF) SYRINGE
PREFILLED_SYRINGE | INTRAVENOUS | Status: AC
  Filled 2019-12-30: qty 10

## 2019-12-30 MED ORDER — HEMOSTATIC AGENTS (NO CHARGE) OPTIME
TOPICAL | Status: DC | PRN
  Administered 2019-12-30: 1 via TOPICAL

## 2019-12-30 MED ORDER — LIDOCAINE-EPINEPHRINE 2 %-1:100000 IJ SOLN
INTRAMUSCULAR | Status: DC | PRN
  Administered 2019-12-30: 10.2 mg via INTRADERMAL

## 2019-12-30 MED ORDER — SODIUM CHLORIDE 0.9 % IV SOLN
2.0000 g | INTRAVENOUS | Status: DC
  Filled 2019-12-30: qty 20

## 2019-12-30 MED ORDER — HEPARIN (PORCINE) 25000 UT/250ML-% IV SOLN
950.0000 [IU]/h | INTRAVENOUS | Status: AC
  Administered 2019-12-30 – 2020-01-01 (×3): 900 [IU]/h via INTRAVENOUS
  Filled 2019-12-30 (×5): qty 250

## 2019-12-30 MED ORDER — LIDOCAINE 2% (20 MG/ML) 5 ML SYRINGE
INTRAMUSCULAR | Status: DC | PRN
  Administered 2019-12-30: 60 mg via INTRAVENOUS

## 2019-12-30 MED ORDER — AMOXICILLIN 500 MG PO CAPS
1000.0000 mg | ORAL_CAPSULE | Freq: Two times a day (BID) | ORAL | Status: DC
  Administered 2019-12-30 – 2020-01-11 (×24): 1000 mg via ORAL
  Filled 2019-12-30 (×25): qty 2

## 2019-12-30 MED ORDER — BUPIVACAINE-EPINEPHRINE (PF) 0.5% -1:200000 IJ SOLN
INTRAMUSCULAR | Status: AC
  Filled 2019-12-30: qty 3.6

## 2019-12-30 MED ORDER — FENTANYL CITRATE (PF) 250 MCG/5ML IJ SOLN
INTRAMUSCULAR | Status: DC | PRN
  Administered 2019-12-30: 150 ug via INTRAVENOUS
  Administered 2019-12-30 (×2): 50 ug via INTRAVENOUS

## 2019-12-30 MED ORDER — BUPIVACAINE-EPINEPHRINE 0.5% -1:200000 IJ SOLN
INTRAMUSCULAR | Status: DC | PRN
  Administered 2019-12-30: 3.6 mL

## 2019-12-30 MED ORDER — AMINOCAPROIC ACID SOLUTION 5% (50 MG/ML)
10.0000 mL | ORAL | Status: AC
  Administered 2019-12-30: 10 mL via ORAL
  Filled 2019-12-30: qty 100

## 2019-12-30 MED ORDER — PHENYLEPHRINE HCL-NACL 10-0.9 MG/250ML-% IV SOLN
INTRAVENOUS | Status: DC | PRN
  Administered 2019-12-30: 40 ug/min via INTRAVENOUS

## 2019-12-30 MED ORDER — OXYCODONE HCL 5 MG PO TABS
5.0000 mg | ORAL_TABLET | ORAL | Status: DC | PRN
  Administered 2020-01-01: 10 mg via ORAL
  Administered 2020-01-03: 5 mg via ORAL
  Administered 2020-01-04 – 2020-01-05 (×2): 10 mg via ORAL
  Administered 2020-01-06: 5 mg via ORAL
  Administered 2020-01-06: 10 mg via ORAL
  Filled 2019-12-30: qty 2
  Filled 2019-12-30 (×2): qty 1
  Filled 2019-12-30 (×3): qty 2

## 2019-12-30 SURGICAL SUPPLY — 40 items
ALCOHOL 70% 16 OZ (MISCELLANEOUS) ×2 IMPLANT
ATTRACTOMAT 16X20 MAGNETIC DRP (DRAPES) ×2 IMPLANT
BANDAGE HEMOSTAT MRDH 4X4 STRL (MISCELLANEOUS) IMPLANT
BLADE SURG 15 STRL LF DISP TIS (BLADE) ×2 IMPLANT
BLADE SURG 15 STRL SS (BLADE) ×4
BNDG HEMOSTAT MRDH 4X4 STRL (MISCELLANEOUS)
COVER SURGICAL LIGHT HANDLE (MISCELLANEOUS) ×2 IMPLANT
COVER WAND RF STERILE (DRAPES) ×2 IMPLANT
GAUZE 4X4 16PLY RFD (DISPOSABLE) ×2 IMPLANT
GAUZE PACKING FOLDED 2  STR (GAUZE/BANDAGES/DRESSINGS) ×2
GAUZE PACKING FOLDED 2 STR (GAUZE/BANDAGES/DRESSINGS) ×1 IMPLANT
GLOVE BIO SURGEON STRL SZ 6.5 (GLOVE) ×2 IMPLANT
GLOVE SURG ORTHO 8.0 STRL STRW (GLOVE) ×2 IMPLANT
GOWN STRL REUS W/ TWL LRG LVL3 (GOWN DISPOSABLE) ×1 IMPLANT
GOWN STRL REUS W/TWL 2XL LVL3 (GOWN DISPOSABLE) ×2 IMPLANT
GOWN STRL REUS W/TWL LRG LVL3 (GOWN DISPOSABLE) ×2
HEMOSTAT SURGICEL 2X14 (HEMOSTASIS) IMPLANT
KIT BASIN OR (CUSTOM PROCEDURE TRAY) ×2 IMPLANT
KIT TURNOVER KIT B (KITS) ×2 IMPLANT
MANIFOLD NEPTUNE II (INSTRUMENTS) ×2 IMPLANT
NDL BLUNT 16X1.5 OR ONLY (NEEDLE) IMPLANT
NDL DENTAL 27 LONG (NEEDLE) IMPLANT
NEEDLE BLUNT 16X1.5 OR ONLY (NEEDLE) IMPLANT
NEEDLE DENTAL 27 LONG (NEEDLE) IMPLANT
NS IRRIG 1000ML POUR BTL (IV SOLUTION) ×2 IMPLANT
PACK EENT II TURBAN DRAPE (CUSTOM PROCEDURE TRAY) ×2 IMPLANT
PAD ARMBOARD 7.5X6 YLW CONV (MISCELLANEOUS) ×2 IMPLANT
SPONGE SURGIFOAM ABS GEL 100 (HEMOSTASIS) ×1 IMPLANT
SPONGE SURGIFOAM ABS GEL 12-7 (HEMOSTASIS) IMPLANT
SPONGE SURGIFOAM ABS GEL SZ50 (HEMOSTASIS) ×1 IMPLANT
SUCTION FRAZIER HANDLE 10FR (MISCELLANEOUS) ×2
SUCTION TUBE FRAZIER 10FR DISP (MISCELLANEOUS) ×1 IMPLANT
SUT CHROMIC 3 0 PS 2 (SUTURE) ×4 IMPLANT
SUT CHROMIC 4 0 P 3 18 (SUTURE) ×2 IMPLANT
SYR 50ML SLIP (SYRINGE) ×2 IMPLANT
TOWEL GREEN STERILE (TOWEL DISPOSABLE) ×2 IMPLANT
TUBE CONNECTING 12X1/4 (SUCTIONS) ×2 IMPLANT
WATER STERILE IRR 1000ML POUR (IV SOLUTION) ×2 IMPLANT
WATER TABLETS ICX (MISCELLANEOUS) ×2 IMPLANT
YANKAUER SUCT BULB TIP NO VENT (SUCTIONS) ×2 IMPLANT

## 2019-12-30 NOTE — Anesthesia Postprocedure Evaluation (Signed)
Anesthesia Post Note  Patient: Lakeeta Dobosz  Procedure(s) Performed: Extraction of tooth #'s 2-8, 11-14, and 20-28 with alveoloplasty and bilateral mandibular lingual tori reductions. (N/A )     Patient location during evaluation: PACU Anesthesia Type: General Level of consciousness: awake and alert and oriented Pain management: pain level controlled Vital Signs Assessment: post-procedure vital signs reviewed and stable Respiratory status: spontaneous breathing, nonlabored ventilation and respiratory function stable Cardiovascular status: blood pressure returned to baseline and stable Postop Assessment: no apparent nausea or vomiting Anesthetic complications: no    Last Vitals:  Vitals:   12/30/19 0511 12/30/19 0955  BP: 112/78 (!) 98/57  Pulse: 65 81  Resp: 17 16  Temp: 36.4 C 37.2 C  SpO2: 100% 95%    Last Pain:  Vitals:   12/30/19 0955  TempSrc:   PainSc: Asleep                 Mozetta Murfin A.

## 2019-12-30 NOTE — Transfer of Care (Signed)
Immediate Anesthesia Transfer of Care Note  Patient: Anna Campos  Procedure(s) Performed: Extraction of tooth #'s 2-8, 11-14, and 20-28 with alveoloplasty and bilateral mandibular lingual tori reductions. (N/A )  Patient Location: PACU  Anesthesia Type:General  Level of Consciousness: awake, alert  and oriented  Airway & Oxygen Therapy: Patient Spontanous Breathing  Post-op Assessment: Report given to RN and Post -op Vital signs reviewed and stable  Post vital signs: Reviewed and stable  Last Vitals:  Vitals Value Taken Time  BP 98/57 12/30/19 0955  Temp    Pulse 76 12/30/19 0957  Resp    SpO2 95 % 12/30/19 0957  Vitals shown include unvalidated device data.  Last Pain:  Vitals:   12/30/19 0511  TempSrc: Oral  PainSc:          Complications: No apparent anesthesia complications

## 2019-12-30 NOTE — Progress Notes (Signed)
Patient down to OR, report given. 

## 2019-12-30 NOTE — Plan of Care (Signed)

## 2019-12-30 NOTE — Anesthesia Procedure Notes (Signed)
Procedure Name: Intubation Date/Time: 12/30/2019 7:37 AM Performed by: Griffin Dakin, CRNA Pre-anesthesia Checklist: Patient identified, Emergency Drugs available, Suction available and Patient being monitored Patient Re-evaluated:Patient Re-evaluated prior to induction Oxygen Delivery Method: Circle system utilized Preoxygenation: Pre-oxygenation with 100% oxygen Induction Type: IV induction Ventilation: Mask ventilation without difficulty Laryngoscope Size: Mac and 3 Grade View: Grade I Tube type: Oral Rae Tube size: 7.0 mm Number of attempts: 1 Placement Confirmation: ETT inserted through vocal cords under direct vision,  positive ETCO2 and breath sounds checked- equal and bilateral Secured at: 21 cm Tube secured with: Tape Dental Injury: Teeth and Oropharynx as per pre-operative assessment

## 2019-12-30 NOTE — Plan of Care (Signed)
  Problem: Education: Goal: Knowledge of General Education information will improve Description: Including pain rating scale, medication(s)/side effects and non-pharmacologic comfort measures Outcome: Progressing   Problem: Health Behavior/Discharge Planning: Goal: Ability to manage health-related needs will improve Outcome: Progressing   Problem: Clinical Measurements: Goal: Will remain free from infection Outcome: Progressing   

## 2019-12-30 NOTE — Progress Notes (Signed)
PROGRESS NOTE    Anna Campos  HYQ:[id number] DOB: 08-Nov-1965 DOA: 12/18/2019 PCP: London Pepper, MD     Brief Narrative:  Anna Campos is a 54 year old female with no significant past medical history presenting with syncope.  She has been having generalized fatigue.  She has had poor p.o. intake and has not eaten in the last day.  She stood up this evening and passed out.  Family heard her fall.  She did hit her head.  She was told she was not unconscious for a long period of time.  She was noted by EMS to be hypotensive and she was given a small fluid bolus.  She was admitted to ICU due to syncope, hypovolemic shock.  Due to EKG changes, cardiology was consulted.  Patient was weaned off Levophed and transferred to [facility] on 3/1.  She was found to have Streptococcus bacteremia.  Infectious disease consulted and recommended 2 weeks of ceftriaxone.  Due to bacteremia, dentistry was also consulted.  Patient developed anemia during her hospitalization, GI consulted and patient underwent EGD on 3/8.  New events last 24 hours / Subjective: Patient underwent dental extraction this morning. She is resting comfortably post-procedure.   Assessment & Plan:   Active Problems:   Syncope   Quadriplegia (HCC)   Hypotension   Bacteremia   Poor dentition   Spinal stenosis of cervical region   Hypovolemic shock and syncope -Admitted to ICU and now weaned off of Levophed.  Cortisol 23.5 -Appreciate cardiology; EKG shows nonspecific findings and pretest probability for CAD is low.  Troponin negative.  Echocardiogram with normal LVEF, normal RV size and function without valvular abnormalities.  No sign of right heart strain. No further cardiac work-up recommended -BP now stable  Strep constellatus bacteremia -ID consulted -Repeat blood cultures 3/1 negative -Continue rocephin for 2 weeks with start date 12/19/2019-01/01/2020  Dental caries -CT maxillofacial on 12/18/2019 without mention  of any dental caries or abscess -Orthopantogram on 12/24/2019 showed numerous dental caries.  Mild lucency around many of the remaining lower teeth. -S/p dental extraction 3/11 with Dr. Lawana Chambers  PE -CTA chest: Small nonocclusive pulmonary emboli bilaterally involving segmental and subsegmental sized vessels -DVT US negative -IV heparin.  Plan to transition to [place] prior to discharge once all surgical intervention is complete  Bilateral extremity weakness with incomplete spinal cord injury C3-C6 -Distal lower extremity weaker compared to proximal -Neurosurgery following, tentative surgical multilevel cervical decompressive procedure planned next week -CIR post op  Acute normocytic anemia/GI bleed/acute blood loss anemia -Hemoglobin dropped to 6.8  (see on admission hemoglobin approximately 9-10) -Denies hematuria or hematochezia, melena. FOBT positive -Anemia panel showed adequate iron and storage -Gastroenterology consulted and appreciated -Transfused 2u PRBC on 3/6 -Status post EGD on 12/27/2019 showing mild erythematous mucosa in the entire gastric cavity.  2 superficial prepyloric ulcers 1 with clean base the other with pigmentation.  1 Endo Clip was deployed to close the mucosal defect.  Erythematous duodenal bulb.  PPI BID recommended for 2 months.  Okay to restart IV heparin. H. pylori serology positive, start triple therapy clarithromycin/amoxcillin/PPI for 14 days starting 3/11 -Hemoglobin remains stable  DVT prophylaxis: IV heparin Code Status: Full code Family Communication: Husband at bedside  Disposition Plan:  . Patient is from home prior to admission. . Currently in-hospital treatment needed due to neurosurgery planned next week . Suspect patient will discharge CIR next week   Consultants:   PCCM admission  Cardiology  ID   Neurosurgery  Dentistry  Procedures:   EGD 3/8  Dental extraction 3/11   Antimicrobials:  Anti-infectives (From admission, onward)     Start     Dose/Rate Route Frequency Ordered Stop   12/30/19 1215  amoxicillin (AMOXIL) capsule 1,000 mg     1,000 mg Oral Every 12 hours 12/30/19 1213 01/13/20 0959   12/30/19 1215  clarithromycin (BIAXIN) tablet 500 mg     500 mg Oral Every 12 hours 12/30/19 1213 01/13/20 0959   12/30/19 0800  cefTRIAXone (ROCEPHIN) 2 g in sodium chloride 0.9 % 100 mL IVPB  Status:  Discontinued     2 g 200 mL/hr over 30 Minutes Intravenous To Surgery 12/30/19 0748 12/30/19 1056   12/21/19 1000  cefTRIAXone (ROCEPHIN) 2 g in sodium chloride 0.9 % 100 mL IVPB     2 g 200 mL/hr over 30 Minutes Intravenous Every 24 hours 12/21/19 0948 01/02/20 2359   12/20/19 2130  vancomycin (VANCOREADY) IVPB 750 mg/150 mL  Status:  Discontinued     750 mg 150 mL/hr over 60 Minutes Intravenous Every 12 hours 12/20/19 1825 12/21/19 0948   12/20/19 1530  ceFAZolin (ANCEF) IVPB 2g/100 mL premix  Status:  Discontinued     2 g 200 mL/hr over 30 Minutes Intravenous Every 8 hours 12/20/19 1526 12/20/19 1814   12/20/19 1000  vancomycin (VANCOREADY) IVPB 750 mg/150 mL  Status:  Discontinued     750 mg 150 mL/hr over 60 Minutes Intravenous Every 12 hours 12/20/19 0054 12/20/19 1019   12/20/19 0130  vancomycin (VANCOREADY) IVPB 1250 mg/250 mL     1,250 mg 166.7 mL/hr over 90 Minutes Intravenous  Once 12/20/19 0054 12/20/19 0356       Objective: Vitals:   12/30/19 1010 12/30/19 1025 12/30/19 1040 12/30/19 1121  BP: 95/63 (!) 92/55 (!) 91/58 99/63  Pulse: 71 79 71 67  Resp: 16 16 16 17   Temp:   97.6 F (36.4 C)   TempSrc:      SpO2: 96% 98% 97% 97%  Weight:      Height:        Intake/Output Summary (Last 24 hours) at 12/30/2019 1256 Last data filed at 12/30/2019 1100 Gross per 24 hour  Intake 2012.85 ml  Output 1900 ml  Net 112.85 ml   Filed Weights   12/28/19 0501 12/29/19 0554 12/30/19 0506  Weight: 57.4 kg 58.5 kg 57.3 kg    Examination: General exam: Appears calm and comfortable  Respiratory system:  Clear to auscultation. Respiratory effort normal. Cardiovascular system: S1 & S2 heard, RRR. No pedal edema. Gastrointestinal system: Abdomen is nondistended, soft and nontender. Normal bowel sounds heard. Extremities: Symmetric in appearance bilaterally  Skin: No rashes, lesions or ulcers on exposed skin    Data Reviewed: I have personally reviewed following labs and imaging studies  CBC: Recent Labs  Lab 12/26/19 0405 12/27/19 0418 12/28/19 0453 12/29/19 0418 12/30/19 0546  WBC 10.4 10.5 10.4 8.0 7.7  HGB 10.3* 10.3* 10.7* 10.3* 10.6*  HCT 30.6* 32.4* 32.9* 32.2* 33.3*  MCV 89.0 93.1 92.4 95.5 96.2  PLT 242 287 320 356 344   Basic Metabolic Panel: Recent Labs  Lab 12/25/19 0444 12/26/19 0405 12/27/19 0418 12/29/19 0418  NA 141 142 141 143  K 3.8 3.7 3.8 3.8  CL 107 109 108 107  CO2 24 26 23 26   GLUCOSE 87 92 88 99  BUN 13 9 11 11   CREATININE 0.68 0.76 0.84 0.77  CALCIUM 8.5* 8.7* 8.7* 9.1  GFR: Estimated Creatinine Clearance: 74.4 mL/min (by C-G formula based on SCr of 0.77 mg/dL). Liver Function Tests: No results for input(s): AST, ALT, ALKPHOS, BILITOT, PROT, ALBUMIN in the last 168 hours. No results for input(s): LIPASE, AMYLASE in the last 168 hours. No results for input(s): AMMONIA in the last 168 hours. Coagulation Profile: No results for input(s): INR, PROTIME in the last 168 hours. Cardiac Enzymes: No results for input(s): CKTOTAL, CKMB, CKMBINDEX, TROPONINI in the last 168 hours. BNP (last 3 results) No results for input(s): PROBNP in the last 8760 hours. HbA1C: No results for input(s): HGBA1C in the last 72 hours. CBG: No results for input(s): GLUCAP in the last 168 hours. Lipid Profile: No results for input(s): CHOL, HDL, LDLCALC, TRIG, CHOLHDL, LDLDIRECT in the last 72 hours. Thyroid Function Tests: No results for input(s): TSH, T4TOTAL, FREET4, T3FREE, THYROIDAB in the last 72 hours. Anemia Panel: No results for input(s): VITAMINB12, FOLATE,  FERRITIN, TIBC, IRON, RETICCTPCT in the last 72 hours. Sepsis Labs: No results for input(s): PROCALCITON, LATICACIDVEN in the last 168 hours.  Recent Results (from the past 240 hour(s))  Culture, blood (routine x 2)     Status: None   Collection Time: 12/20/19  6:29 PM   Specimen: BLOOD RIGHT WRIST  Result Value Ref Range Status   Specimen Description BLOOD RIGHT WRIST  Final   Special Requests   Final    BOTTLES DRAWN AEROBIC AND ANAEROBIC Blood Culture adequate volume   Culture   Final    NO GROWTH 5 DAYS Performed at [facility], [address], [place], [place] [address]    Report Status 12/25/2019 FINAL  Final  Culture, blood (routine x 2)     Status: None   Collection Time: 12/20/19  6:45 PM   Specimen: BLOOD RIGHT HAND  Result Value Ref Range Status   Specimen Description BLOOD RIGHT HAND  Final   Special Requests   Final    BOTTLES DRAWN AEROBIC AND ANAEROBIC Blood Culture adequate volume   Culture   Final    NO GROWTH 5 DAYS Performed at [facility], [address] [place], [place] [address]    Report Status 12/25/2019 FINAL  Final      Radiology Studies: No results found.    Scheduled Meds: . aminocaproic acid  10 mL Oral Q1H  . amoxicillin  1,000 mg Oral Q12H   And  . clarithromycin  500 mg Oral Q12H   And  . pantoprazole  40 mg Oral BID AC  . Chlorhexidine Gluconate Cloth  6 each Topical Daily  . loratadine  10 mg Oral Daily  . multivitamin with minerals  1 tablet Oral Daily   Continuous Infusions: . sodium chloride 250 mL (12/29/19 2108)  . cefTRIAXone (ROCEPHIN)  IV 2 g (12/29/19 2109)  . heparin 900 Units/hr (12/30/19 1142)     LOS: 11 days      Time spent: 20 minutes   Noralee Stain, DO [facility] 12/30/2019, 12:56 PM   Available via Epic secure chat 7am-7pm After these hours, please refer to coverage provider listed on [email or web address]

## 2019-12-30 NOTE — Progress Notes (Signed)
PRE-OPERATIVE NOTE:  12/30/2019 Anna Campos [id number]  VITALS: BP 112/78 (BP Location: Right Arm)   Pulse 65   Temp 97.6 F (36.4 C) (Oral)   Resp 17   Ht 5\' 9"  (1.753 m)   Wt 57.3 kg   SpO2 100%   BMI 18.65 kg/m   Lab Results  Component Value Date   WBC 7.7 12/30/2019   HGB 10.6 (L) 12/30/2019   HCT 33.3 (L) 12/30/2019   MCV 96.2 12/30/2019   PLT 344 12/30/2019   BMET    Component Value Date/Time   NA 143 12/29/2019 0418   K 3.8 12/29/2019 0418   CL 107 12/29/2019 0418   CO2 26 12/29/2019 0418   GLUCOSE 99 12/29/2019 0418   BUN 11 12/29/2019 0418   CREATININE 0.77 12/29/2019 0418   CALCIUM 9.1 12/29/2019 0418   GFRNONAA >60 12/29/2019 0418   GFRAA >60 12/29/2019 0418    Lab Results  Component Value Date   INR 1.1 12/18/2019   No results found for: PTT   12/20/2019 presents for multiple dental extractions with alveoloplasty and preprosthetic surgery as needed in the operating room with general anesthesia.     SUBJECTIVE: The patient denies any acute medical or dental changes and agrees to proceed with treatment as planned.  EXAM: No sign of acute dental changes.  ASSESSMENT: Patient is affected by chronic apical periodontitis, dental caries, retained root segments, chronic periodontitis, and loose teeth.  PLAN: Patient agrees to proceed with treatment as planned in the operating room as previously discussed and accepts the risks, benefits, and complications of the proposed treatment. Patient is aware of the risk for bleeding, bruising, swelling, infection, pain, nerve damage, soft tissue damage, sinus involvement, root tip fracture, mandible fracture, and the risks of complications associated with the anesthesia. Patient also is aware of the potential for other complications up to and including death due to her overall cardiovascular and medical compromise.      Anna Campos, DDS

## 2019-12-30 NOTE — Progress Notes (Signed)
   Providing Compassionate, Quality Care - Together   Subjective: Patient recently returned from OR for dental extractions. She reports her mouth is sore.  Objective: Vital signs in last 24 hours: Temp:  [97.6 F (36.4 C)-99 F (37.2 C)] 97.6 F (36.4 C) (03/11 1040) Pulse Rate:  [65-87] 67 (03/11 1121) Resp:  [16-20] 17 (03/11 1121) BP: (91-112)/(55-78) 99/63 (03/11 1121) SpO2:  [95 %-100 %] 97 % (03/11 1121) Weight:  [57.3 kg] 57.3 kg (03/11 0506)  Intake/Output from previous day: 03/10 0701 - 03/11 0700 In: 962.9 [P.O.:480; I.V.:182.9; IV Piggyback:300] Out: 1750 [Urine:1750] Intake/Output this shift: Total I/O In: 1050 [I.V.:1000; Other:50] Out: 150 [Blood:150]  Alert and oriented x 4 PERRLA Gauze in mouth with sanguinous drainage CN II-XII grossly inact 3/5 strength in bilateral deltoids, 2/3 strength in bilateral biceps 2/5 grip strength bilaterally 3/5 bilateral hip flexors Decreased light-touch sensation in all extremities  Lab Results: Recent Labs    12/29/19 0418 12/30/19 0546  WBC 8.0 7.7  HGB 10.3* 10.6*  HCT 32.2* 33.3*  PLT 356 344   BMET Recent Labs    12/29/19 0418  NA 143  K 3.8  CL 107  CO2 26  GLUCOSE 99  BUN 11  CREATININE 0.77  CALCIUM 9.1    Studies/Results: No results found.  Assessment/Plan: [name] had a syncopal episode at home followed by profound weakness. She was admitted with streptococcal bacteremia and a pulmonary embolism. MRI scan of the patient's c-spine revealed significant stenosis. [name] a significant incomplete spinal cord injury secondary to her fall superimposed upon significant degenerative disease with stenosis most prominently at the C4-5 level but also significantly at the C3-4 and C5-6 levels. EGD performed by Dr. Marca Ancona on 12/27/2019 revealed 2 superficial prepyloric ulcers. Dental extractions performed on 12/30/2019 by Dr. Kristin Bruins.   LOS: 11 days    -Cervical spine surgery planned for  01/04/2020   Val Eagle, DNP, AGNP-C Nurse Practitioner  [facility] [address], [address], [place], [place] [address] P: [phone]    F: [phone]  12/30/2019, 1:21 PM

## 2019-12-30 NOTE — Op Note (Signed)
OPERATIVE REPORT  Patient:            Anna Campos Date of Birth:  [birth year] MRN:                [id number]   DATE OF PROCEDURE:  12/30/2019  PREOPERATIVE DIAGNOSES: 1.  Bacteremia 2.  Chronic apical periodontitis 3.  Dental caries 4.  Retained root segments 5.  Chronic periodontitis 6.  Loose teeth 7.  Bilateral mandibular lingual tori  POSTOPERATIVE DIAGNOSES: 1.  Bacteremia 2.  Chronic apical periodontitis 3.  Dental caries 4.  Retained root segments 5.  Chronic periodontitis 6.  Loose teeth 7.  Bilateral mandibular lingual tori   OPERATIONS: 1. Multiple extraction of tooth numbers 2 -8, 12 -14, 18, and 20 through 28. 2. 4 Quadrants of alveoloplasty 3.  Bilateral mandibular lingual tori reductions   SURGEON: Lenn Cal, DDS  ASSISTANT: Molli Posey (dental assistant)  ANESTHESIA: General anesthesia via oral endotracheal tube.  MEDICATIONS: 1.  Rocephin 2 g IV prior to invasive dental procedures. 2. Local anesthesia with a total utilization of 6 carpules each containing 34 mg of lidocaine with 0.017 mg of epinephrine as well as 2 carpules each containing 9 mg of bupivacaine with 0.009 mg of epinephrine.  SPECIMENS: There are 21 teeth that were discarded.  DRAINS: None  CULTURES: None  COMPLICATIONS: None  ESTIMATED BLOOD LOSS: 150 mLs.  INTRAVENOUS FLUIDS: 700 mLs of Lactated ringers solution.  INDICATIONS: The patient was recently diagnosed with streptococcal bacteremia.  A medically necessary dental consultation was then requested to aid with poor dentition as a source of the bacteremia.  The patient was examined and treatment planned for extraction remaining teeth with alveoloplasty and preprosthetic surgery as needed in the operating room with general anesthesia.  This treatment plan was formulated to decrease the risks and complications associated with dental infection from affecting the patient's systemic health.  OPERATIVE  FINDINGS: Patient was examined operating room number 6.  The teeth were identified for extraction. The patient was noted be affected by chronic apical periodontitis, retained root segments, dental caries, chronic periodontitis, loose teeth, and bilateral mandibular lingual tori.   DESCRIPTION OF PROCEDURE: Patient was brought to the main operating room number 6. Patient was then placed in the supine position on the operating table. General anesthesia was then induced per the anesthesia team. The patient was then prepped and draped in the usual manner for dental medicine procedure. A timeout was performed. The patient was identified and procedures were verified. A throat pack was placed at this time. The oral cavity was then thoroughly examined with the findings noted above. The patient was then ready for dental medicine procedure as follows:  Local anesthesia was then administered sequentially with a total utilization of 6 carpules each containing 34 mg of lidocaine with 0.017 mg of epinephrine as well as 2 carpules  each containing 9 mg bupivacaine with 0.009 mg of epinephrine.  The Maxillary left and right quadrants first approached. Anesthesia was then delivered utilizing infiltration with lidocaine with epinephrine. A #15 blade incision was then made from the maxillary right tuberosity and extended to the distal of the maxillary left tuberosity.  A  surgical flap was then carefully reflected. The maxillary teeth were then subluxated with a series of straight elevators. Tooth numbers 2, 3, 4, 5, 6, 7, 8, 11, 13, 14 were then removed with a 150 forceps without complication.  Coronal aspect of tooth #12 was removed leaving a retained root.  Further  bone was then removed around the retained root with a surgical handpiece and bur and copious amounts of sterile water.  The root tip was then elevated out with a root to pick. Alveoloplasty was then performed utilizing a ronguers and bone file to help achieve  primary closure.  The tissues were approximated and trimmed appropriately.  The surgical sites were then irrigated with copious amounts of sterile saline.  A piece of Surgifoam impregnated with topical thrombin were then placed in each extraction socket appropriately.  The maxillary right surgical site was then closed from the maxillary right tuberosity and extended the mesial #8 utilizing 3-0 Chromic Gut suture in a continuous erupted suture technique x1.  The maxillary left surgical site was then closed from the maxillary left tuberosity and extended the mesial #9 utilizing 3-0 Chromic Gut suture in a continuous erupted suture technique x1.  At this point time, the mandibular quadrants were approached. The patient was given bilateral inferior alveolar nerve blocks and long buccal nerve blocks utilizing the bupivacaine with epinephrine. Further infiltration was then achieved utilizing the lidocaine with epinephrine. A 15 blade incision was then made from the distal of number 17 and extended the distal of #31.  A surgical flap was then carefully reflected.  The mandibular teeth were then subluxated with a series of straight elevators.  The coronal aspect of tooth #18 was then removed with a 2 3 forceps leaving the roots remaining.  A surgical handpiece and bur copious amounts sterile water were used to remove buccal and interseptal bone.  The roots were then elevated out with a series of Cryer's elevators appropriately.  Tooth numbers 20, 21, 22, 23, 24, 25, 26, 27, and 28 were then removed with a 151 forceps without complications.  The lingual flaps were then further reflected to expose the bilateral mandibular lingual tori.  These were then reduced utilizing a surgical handpiece and bur and copious amounts of sterile water.  The alveoloplasty was then performed to the mandibular left and mandibular right quadrants to help achieve primary closure.  The surgical sites were then irrigated with copious amounts  sterile saline x4.  The tissues were approximated and trimmed appropriately.  A piece of Surgifoam impregnated with topical thrombin were then placed in each extraction socket appropriately.  The mandibular left surgical site was then closed from the distal #17 extended the mesial #24 utilizing 3-0 Chromic Gut suture in a continuous erupted suture technique x1.  The mandibular right surgical site was then closed from the distal 31 extended to the mesial #25 utilizing 3-0 Chromic Gut suture in a continuous erupted suture technique x1.  1 interrupted sutures then placed to further close surgical site as needed.   At this point time, the entire mouth was irrigated with copious amounts of sterile saline. The patient was examined for complications, seeing none, the dental medicine procedure was deemed to be complete. The throat pack was removed at this time. An oral airway was then placed at the request of the anesthesia team. A series of 4 x 4 gauze moistened with Amicar 5% rinse were placed in the mouth to aid hemostasis. The patient was then handed over to the anesthesia team for final disposition. After an appropriate amount of time, the patient was extubated and taken to the postanesthsia care unit in good condition. All counts were correct for the dental medicine procedure.  The patient is continue the Amicar 5% rinses postoperatively.  Patient is to rinse with 10 mils every hour  for the next 10 hours and a swish and spit manner.  Amicar rinse may then be used as needed for persistent oozing.  The heparin therapy is to be restarted by the pharmacy team at 2200 hrs. if no significant oral bleeding at the previous rate and with no bolus.    Charlynne Pander, DDS.

## 2019-12-31 LAB — CBC
HCT: 33.6 % — ABNORMAL LOW (ref 36.0–46.0)
Hemoglobin: 10.5 g/dL — ABNORMAL LOW (ref 12.0–15.0)
MCH: 30.2 pg (ref 26.0–34.0)
MCHC: 31.3 g/dL (ref 30.0–36.0)
MCV: 96.6 fL (ref 80.0–100.0)
Platelets: 368 10*3/uL (ref 150–400)
RBC: 3.48 MIL/uL — ABNORMAL LOW (ref 3.87–5.11)
RDW: 15.3 % (ref 11.5–15.5)
WBC: 16.5 10*3/uL — ABNORMAL HIGH (ref 4.0–10.5)
nRBC: 0 % (ref 0.0–0.2)

## 2019-12-31 LAB — HEPARIN LEVEL (UNFRACTIONATED): Heparin Unfractionated: 0.38 IU/mL (ref 0.30–0.70)

## 2019-12-31 MED ORDER — CHLORHEXIDINE GLUCONATE 0.12 % MT SOLN
15.0000 mL | Freq: Two times a day (BID) | OROMUCOSAL | Status: DC
  Administered 2019-12-31 – 2020-01-11 (×20): 15 mL via OROMUCOSAL
  Filled 2019-12-31 (×21): qty 15

## 2019-12-31 MED ORDER — BOOST / RESOURCE BREEZE PO LIQD CUSTOM
1.0000 | Freq: Three times a day (TID) | ORAL | Status: DC
  Administered 2019-12-31 – 2020-01-03 (×9): 1 via ORAL

## 2019-12-31 MED ORDER — SODIUM CHLORIDE 0.9 % IR SOLN
200.0000 mL | Status: DC
  Administered 2019-12-31: 200 mL

## 2019-12-31 NOTE — Progress Notes (Signed)
Orthopedic Tech Progress Note Patient Details:  Cherye Gaertner 06-13-1966 [id number]  Ortho Devices Type of Ortho Device: Soft collar Ortho Device/Splint Location: drop off       Saul Fordyce 12/31/2019, 1:57 PM

## 2019-12-31 NOTE — Progress Notes (Signed)
Initial Nutrition Assessment  DOCUMENTATION CODES:   Non-severe (moderate) malnutrition in context of social or environmental circumstances, Underweight  INTERVENTION:   -Boost Breeze po TID, each supplement provides 250 kcal and 9 grams of protein -MVI with minerals daily -RD will follow for diet advancement and supplement diet as appropriate  NUTRITION DIAGNOSIS:   Moderate Malnutrition related to social / environmental circumstances as evidenced by mild fat depletion, moderate fat depletion, mild muscle depletion, moderate muscle depletion, energy intake < or equal to 75% for > or equal to 1 month.  GOAL:   Patient will meet greater than or equal to 90% of their needs  MONITOR:   PO intake, Supplement acceptance, Diet advancement, Labs, Weight trends, Skin, I & O's  REASON FOR ASSESSMENT:   Consult Assessment of nutrition requirement/status  ASSESSMENT:   54 year old woman with no significant past medical history presenting with syncope.  She has been having generalized fatigue.  She has had poor p.o. intake and has not eaten in the last day.  She stood up this evening and passed out.  Family heard her fall.  She did hit her head.  She was told she was not unconscious for a long period of time.  She was noted by EMS to be hypotensive and she was given a small fluid bolus.  Presently she complains of feeling pain all over her body.  She admits to using marijuana today.  She does not take any other medications other than a multivitamin.  She lives with her husband.  She denies that he takes any medications either.  She does not think that she could've inadvertently taken any antihypertensives.  She did recently have tooth pain and had taken some Tylenol with codeine several days ago.  None since.  Pt admitted with syncope, anemia, hypovolemic shock, and streptococcus constellatus bacteremia.  3/8- s/p EGD- revealed diffuse mildly erythematous mucosa without bleeding founding int  gastric body, gastric antrum, pre-pyloric region, and pylorus; two non-bleeding superficial ulcers found in pre-pyloric region of stomach- hemostasisclip placed 3/11- s/p OPERATIONS: 1. Multiple extraction of tooth numbers 2 -8, 12 -14, 18, and 20 through 28. 2. 4 Quadrants of alveoloplasty 3.  Bilateral mandibular lingual tori reductions  Reviewed I/O's: +209 ml x 24 hours and -3.2 L since admission  UOP: 2 L x 24 hours  Per chart review, pt with fall resulting in spinal cord contusion resulting in quadriparesis. Plan for neurosurgical intervention on Monday or Tuesday of next week.   Pt lying in bed at time of visit, pleasant and in good spirits. Pt very positive, smiling, and stating "I am getting better everyday". Pt shares that she has had a good appetite during hospitalization and has been consuming most of her meals here; noted meal completion 25-100%. Pt has been tolerating clear liquids without difficulty and thinks that she can eat something more substantial, such as milk or mashed potatoes. RD reviewed possibility for diet progression and potential for at least temporary need for mechanically altered diet based upon tolerance.   Pt reports poor appetite PTA. Per her report, she has not been "listening to my body, even though it was giving me signals that something was wrong". 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 Pt reports she enjoys her work, but shares the demands of her job often do not allow her to take meal breaks  or make time for meals and snacks as she should. Per pt, she would sometimes only eat 1-2 meal per day. She reports her husband noticed that she has gradually lost weight. Per pt, her UBW is 145# and she has always been a small framed person, but estimates she has lost at least 20 pounds, but unsure when the weight loss started. No weight history  available to confirm these findings.   Discussed importance of good meal and supplement intake to promote healing. Pt very gracious and motivated, and very receptive to RD recommendations. She would like to try oral nutrition supplements.   Labs reviewed.   NUTRITION - FOCUSED PHYSICAL EXAM:    Most Recent Value  Orbital Region  Moderate depletion  Upper Arm Region  Mild depletion  Thoracic and Lumbar Region  No depletion  Buccal Region  Mild depletion  Temple Region  Mild depletion  Clavicle Bone Region  Mild depletion  Clavicle and Acromion Bone Region  Mild depletion  Scapular Bone Region  Mild depletion  Dorsal Hand  Mild depletion  Patellar Region  Moderate depletion  Anterior Thigh Region  Moderate depletion  Posterior Calf Region  Moderate depletion  Edema (RD Assessment)  None  Hair  Reviewed  Eyes  Reviewed  Mouth  Reviewed  Skin  Reviewed  Nails  Reviewed       Diet Order:   Diet Order            Diet clear liquid Room service appropriate? No; Fluid consistency: Thin  Diet effective now              EDUCATION NEEDS:   Education needs have been addressed  Skin:  Skin Assessment: Reviewed RN Assessment  Last BM:  12/29/19  Height:   Ht Readings from Last 1 Encounters:  12/19/19 5\' 9"  (1.753 m)    Weight:   Wt Readings from Last 1 Encounters:  12/31/19 56.5 kg    Ideal Body Weight:  57 kg(adjusted for quadriplegia)  BMI:  Body mass index is 18.39 kg/m.  Estimated Nutritional Needs:   Kcal:  1448-1856  Protein:  100-115 grams  Fluid:  > 1.7 L    Loistine Chance, RD, LDN, [place] Registered Dietitian II Certified Diabetes Care and Education Specialist Please refer to [facility] for RD and/or RD on-call/weekend/after hours pager

## 2019-12-31 NOTE — Progress Notes (Signed)
Physical Therapy Treatment Patient Details Name: Anna Campos MRN: [id number] DOB: Apr 03, 1966 Today's Date: 12/31/2019    History of Present Illness 54 year old woman with no significant past medical history presenting with syncope and s/p fall. Pt found to have incomplete spincal cord injury, bacteremia, and bilat PEs. N/S following, plan for surgery in 2 weeks.    PT Comments    Patient seen for mobility progression. Pt in bed upon arrival and eager to participate in therapy. Pt able to stand X 2 trials before c/o "wooziness". BP taken in sitting just after second stand was 53/42 (48). Pt returned to bed and BP in supine was 83/53 (63) and pt reported feeling better. This therapist spoke to Dr. Jordan Likes and Andrey Campanile, PT about soft collar for pain management while mobilizing and order placed today. PT will continue to follow and progress as tolerated.    Follow Up Recommendations  CIR     Equipment Recommendations  Wheelchair (measurements PT);Wheelchair cushion (measurements PT)    Recommendations for Other Services Rehab consult     Precautions / Restrictions Precautions Precautions: Fall Precaution Comments: central cord Restrictions Weight Bearing Restrictions: No    Mobility  Bed Mobility Overal bed mobility: Needs Assistance Bed Mobility: Rolling;Sidelying to Sit;Sit to Sidelying Rolling: Min assist Sidelying to sit: Mod assist     Sit to sidelying: Mod assist;+2 for physical assistance;+2 for safety/equipment General bed mobility comments: cues for sequencing and technique; asssit at pelvis/bilat LE to roll onto L side; use of rail; assist to elevate trunk into sitting and then to lower trunk and bring bilat LE into bed end of session  Transfers Overall transfer level: Needs assistance Equipment used: (2 person lift with gait belt) Transfers: Sit to/from Stand Sit to Stand: +2 physical assistance;Mod assist         General transfer comment: pt stood X2  trials using Stedy standing frame given pt's knees buckle if not locked out in extension; +2 to power up into standing, assist to maintain bilat hand grip, and tactile cues for flexing LE to sit   Ambulation/Gait             General Gait Details: unable at this time   Stairs             Wheelchair Mobility    Modified Rankin (Stroke Patients Only)       Balance Overall balance assessment: Needs assistance Sitting-balance support: Feet supported Sitting balance-Leahy Scale: Fair Sitting balance - Comments: fair for static sitting balance; pt could not tolerated challenges without assistance to maintain balance EOB   Standing balance support: During functional activity;Bilateral upper extremity supported Standing balance-Leahy Scale: Poor Standing balance comment: worked on lateral weigth shifting and midline posture                            Cognition Arousal/Alertness: Awake/alert Behavior During Therapy: WFL for tasks assessed/performed Overall Cognitive Status: Within Functional Limits for tasks assessed                                        Exercises      General Comments General comments (skin integrity, edema, etc.): Pt with c/o "wooziness" in standing; BP taken in sitting just after standing 53/43 (48) and in supine end of session BP 83/53 (63)       Pertinent Vitals/Pain Pain  Assessment: Faces Faces Pain Scale: Hurts little more Pain Location: spasms in bilat LE (mostly L hip) but otherwise no pain Pain Descriptors / Indicators: Guarding;Grimacing;Spasm Pain Intervention(s): Limited activity within patient's tolerance;Monitored during session;Repositioned;Premedicated before session    Home Living                      Prior Function            PT Goals (current goals can now be found in the care plan section) Progress towards PT goals: Progressing toward goals    Frequency    Min 4X/week      PT  Plan Current plan remains appropriate    Co-evaluation              AM-PAC PT "6 Clicks" Mobility   Outcome Measure  Help needed turning from your back to your side while in a flat bed without using bedrails?: A Lot Help needed moving from lying on your back to sitting on the side of a flat bed without using bedrails?: A Lot Help needed moving to and from a bed to a chair (including a wheelchair)?: A Lot Help needed standing up from a chair using your arms (e.g., wheelchair or bedside chair)?: A Lot Help needed to walk in hospital room?: Total Help needed climbing 3-5 steps with a railing? : Total 6 Click Score: 10    End of Session Equipment Utilized During Treatment: Gait belt Activity Tolerance: Other (comment)(limited by dizziness and soft BP) Patient left: with call bell/phone within reach;in bed Nurse Communication: Mobility status PT Visit Diagnosis: Unsteadiness on feet (R26.81);Other abnormalities of gait and mobility (R26.89);Repeated falls (R29.6);Muscle weakness (generalized) (M62.81);History of falling (Z91.81);Ataxic gait (R26.0);Difficulty in walking, not elsewhere classified (R26.2)     Time: 6195-0932 PT Time Calculation (min) (ACUTE ONLY): 40 min  Charges:  $Gait Training: 8-22 mins $Therapeutic Activity: 23-37 mins                     Earney Navy, PTA Acute Rehabilitation Services Pager: [phone] Office: [phone]     Darliss Cheney 12/31/2019, 1:00 PM

## 2019-12-31 NOTE — Progress Notes (Addendum)
Overall stable.  Patient tolerated dental extraction yesterday well.  Neck pain controlled.  Upper extremity and lower extremity function continues to slowly improve.  I discussed situation with patient.  Recommended we move forward with C3-4, C4-5, C5-6 and C6-7 anterior cervical discectomy and interbody fusion utilizing interbody peek cages, with harvested autograft, and anterior plate instrumentation on Tuesday.  Discussed the risks involved with surgery including but not limited to risk of anesthesia, bleeding, infection, CSF leak, nerve root injury, spinal cord injury, fusion failure, as patient failure, dysphagia, dysphonia, continued pain, and nonbenefit.  Patient has been given the opportunity ask questions.  She appears to understand.  She wishes to proceed with surgery.  Plan to hold heparin at midnight Monday night.

## 2019-12-31 NOTE — Progress Notes (Signed)
PROGRESS NOTE    Anna Campos  ZOX:[id number] DOB: 06-29-1966 DOA: 12/18/2019 PCP: Farris Has, MD     Brief Narrative:  Anna Campos is a 54 year old female with no significant past medical history presenting with syncope.  She has been having generalized fatigue.  She has had poor p.o. intake and has not eaten in the last day.  She stood up this evening and passed out.  Family heard her fall.  She did hit her head.  She was told she was not unconscious for a long period of time.  She was noted by EMS to be hypotensive and she was given a small fluid bolus.  She was admitted to ICU due to syncope, hypovolemic shock.  Due to EKG changes, cardiology was consulted.  Patient was weaned off Levophed and transferred to [facility] on 3/1.  She was found to have Streptococcus bacteremia.  Infectious disease consulted and recommended 2 weeks of ceftriaxone.  Due to bacteremia, dentistry was also consulted.  Patient developed anemia during her hospitalization, GI consulted and patient underwent EGD on 3/8.  Patient underwent dental extraction on 3/11.  New events last 24 hours / Subjective: Doing well on clear liquid diet, states that she does not feel she could tolerate full liquid diet just yet.  Pain well controlled.  Assessment & Plan:   Active Problems:   Syncope   Quadriplegia (HCC)   Hypotension   Bacteremia   Poor dentition   Spinal stenosis of cervical region   Hypovolemic shock and syncope -Admitted to ICU and now weaned off of Levophed.  Cortisol 23.5 -Appreciate cardiology; EKG shows nonspecific findings and pretest probability for CAD is low.  Troponin negative.  Echocardiogram with normal LVEF, normal RV size and function without valvular abnormalities.  No sign of right heart strain. No further cardiac work-up recommended -Stable  Strep constellatus bacteremia -ID consulted -Repeat blood cultures 3/1 negative -Continue rocephin for 2 weeks with start date  12/19/2019-01/01/2020  Dental caries -CT maxillofacial on 12/18/2019 without mention of any dental caries or abscess -Orthopantogram on 12/24/2019 showed numerous dental caries.  Mild lucency around many of the remaining lower teeth. -S/p dental extraction 3/11 with Dr. Robin Searing  PE -CTA chest: Small nonocclusive pulmonary emboli bilaterally involving segmental and subsegmental sized vessels -DVT US negative -IV heparin.  Plan to transition to DOAC prior to discharge once all surgical intervention is complete  Bilateral extremity weakness with incomplete spinal cord injury C3-C6 -Distal lower extremity weaker compared to proximal -Neurosurgery following, tentative surgical multilevel cervical decompressive procedure planned 3/16 -CIR post op  Acute normocytic anemia/GI bleed/acute blood loss anemia -Hemoglobin dropped to 6.8  (see on admission hemoglobin approximately 9-10) -Denies hematuria or hematochezia, melena. FOBT positive -Anemia panel showed adequate iron and storage -Gastroenterology consulted and appreciated -Transfused 2u PRBC on 3/6 -Status post EGD on 12/27/2019 showing mild erythematous mucosa in the entire gastric cavity.  2 superficial prepyloric ulcers 1 with clean base the other with pigmentation.  1 Endo Clip was deployed to close the mucosal defect.  Erythematous duodenal bulb.  PPI BID recommended for 2 months.  Okay to restart IV heparin. H. pylori serology positive, start triple therapy clarithromycin/amoxcillin/PPI for 14 days starting 3/11 -Hemoglobin remains stable  DVT prophylaxis: IV heparin Code Status: Full code Family Communication: No family at bedside Disposition Plan:  . Patient is from home prior to admission. . Currently in-hospital treatment needed due to neurosurgery planned next week, tentatively planned for 3/16 . Suspect patient will  discharge CIR next week   Consultants:   PCCM admission  Cardiology  ID   Neurosurgery   Dentistry   Procedures:   EGD 3/8  Dental extraction 3/11   Antimicrobials:  Anti-infectives (From admission, onward)   Start     Dose/Rate Route Frequency Ordered Stop   12/30/19 1215  amoxicillin (AMOXIL) capsule 1,000 mg     1,000 mg Oral Every 12 hours 12/30/19 1213 01/13/20 0959   12/30/19 1215  clarithromycin (BIAXIN) tablet 500 mg     500 mg Oral Every 12 hours 12/30/19 1213 01/13/20 0959   12/30/19 0800  cefTRIAXone (ROCEPHIN) 2 g in sodium chloride 0.9 % 100 mL IVPB  Status:  Discontinued     2 g 200 mL/hr over 30 Minutes Intravenous To Surgery 12/30/19 0748 12/30/19 1056   12/21/19 1000  cefTRIAXone (ROCEPHIN) 2 g in sodium chloride 0.9 % 100 mL IVPB     2 g 200 mL/hr over 30 Minutes Intravenous Every 24 hours 12/21/19 0948 01/02/20 2359   12/20/19 2130  vancomycin (VANCOREADY) IVPB 750 mg/150 mL  Status:  Discontinued     750 mg 150 mL/hr over 60 Minutes Intravenous Every 12 hours 12/20/19 1825 12/21/19 0948   12/20/19 1530  ceFAZolin (ANCEF) IVPB 2g/100 mL premix  Status:  Discontinued     2 g 200 mL/hr over 30 Minutes Intravenous Every 8 hours 12/20/19 1526 12/20/19 1814   12/20/19 1000  vancomycin (VANCOREADY) IVPB 750 mg/150 mL  Status:  Discontinued     750 mg 150 mL/hr over 60 Minutes Intravenous Every 12 hours 12/20/19 0054 12/20/19 1019   12/20/19 0130  vancomycin (VANCOREADY) IVPB 1250 mg/250 mL     1,250 mg 166.7 mL/hr over 90 Minutes Intravenous  Once 12/20/19 0054 12/20/19 0356       Objective: Vitals:   12/31/19 0155 12/31/19 0433 12/31/19 0435 12/31/19 0755  BP: (!) 91/55 97/63  100/71  Pulse: 78 72  69  Resp: 18 17  20   Temp: 98.1 F (36.7 C) (!) 97.4 F (36.3 C)  98.5 F (36.9 C)  TempSrc: Oral Oral  Oral  SpO2: 100% 100%  99%  Weight:   56.5 kg   Height:        Intake/Output Summary (Last 24 hours) at 12/31/2019 1018 Last data filed at 12/31/2019 0850 Gross per 24 hour  Intake 1719.36 ml  Output 2000 ml  Net -280.64 ml   Filed Weights    12/29/19 0554 12/30/19 0506 12/31/19 0435  Weight: 58.5 kg 57.3 kg 56.5 kg    Examination: General exam: Appears calm and comfortable  Mouth: Status post dental extraction without any bleeding noted Respiratory system: Clear to auscultation. Respiratory effort normal. Cardiovascular system: S1 & S2 heard, RRR. No pedal edema. Gastrointestinal system: Abdomen is nondistended, soft and nontender. Normal bowel sounds heard. Central nervous system: Alert and oriented. Non focal exam. Speech clear  Extremities: Symmetric in appearance bilaterally  Skin: No rashes, lesions or ulcers on exposed skin  Psychiatry: Judgement and insight appear stable. Mood & affect appropriate.     Data Reviewed: I have personally reviewed following labs and imaging studies  CBC: Recent Labs  Lab 12/27/19 0418 12/28/19 0453 12/29/19 0418 12/30/19 0546 12/31/19 0350  WBC 10.5 10.4 8.0 7.7 16.5*  HGB 10.3* 10.7* 10.3* 10.6* 10.5*  HCT 32.4* 32.9* 32.2* 33.3* 33.6*  MCV 93.1 92.4 95.5 96.2 96.6  PLT 287 320 356 344 368   Basic Metabolic Panel: Recent Labs  Lab 12/25/19 0444 12/26/19 0405 12/27/19 0418 12/29/19 0418  NA 141 142 141 143  K 3.8 3.7 3.8 3.8  CL 107 109 108 107  CO2 24 26 23 26   GLUCOSE 87 92 88 99  BUN 13 9 11 11   CREATININE 0.68 0.76 0.84 0.77  CALCIUM 8.5* 8.7* 8.7* 9.1   GFR: Estimated Creatinine Clearance: 73.4 mL/min (by C-G formula based on SCr of 0.77 mg/dL). Liver Function Tests: No results for input(s): AST, ALT, ALKPHOS, BILITOT, PROT, ALBUMIN in the last 168 hours. No results for input(s): LIPASE, AMYLASE in the last 168 hours. No results for input(s): AMMONIA in the last 168 hours. Coagulation Profile: No results for input(s): INR, PROTIME in the last 168 hours. Cardiac Enzymes: No results for input(s): CKTOTAL, CKMB, CKMBINDEX, TROPONINI in the last 168 hours. BNP (last 3 results) No results for input(s): PROBNP in the last 8760 hours. HbA1C: No results for  input(s): HGBA1C in the last 72 hours. CBG: No results for input(s): GLUCAP in the last 168 hours. Lipid Profile: No results for input(s): CHOL, HDL, LDLCALC, TRIG, CHOLHDL, LDLDIRECT in the last 72 hours. Thyroid Function Tests: No results for input(s): TSH, T4TOTAL, FREET4, T3FREE, THYROIDAB in the last 72 hours. Anemia Panel: No results for input(s): VITAMINB12, FOLATE, FERRITIN, TIBC, IRON, RETICCTPCT in the last 72 hours. Sepsis Labs: No results for input(s): PROCALCITON, LATICACIDVEN in the last 168 hours.  No results found for this or any previous visit (from the past 240 hour(s)).    Radiology Studies: No results found.    Scheduled Meds: . amoxicillin  1,000 mg Oral Q12H   And  . clarithromycin  500 mg Oral Q12H   And  . pantoprazole  40 mg Oral BID AC  . Chlorhexidine Gluconate Cloth  6 each Topical Daily  . loratadine  10 mg Oral Daily  . multivitamin with minerals  1 tablet Oral Daily   Continuous Infusions: . sodium chloride 250 mL (12/30/19 2111)  . cefTRIAXone (ROCEPHIN)  IV 2 g (12/30/19 2112)  . heparin 900 Units/hr (12/30/19 2056)  . lactated ringers Stopped (12/30/19 2116)     LOS: 12 days      Time spent: 20 minutes   Dessa Phi, DO [facility] 12/31/2019, 10:18 AM   Available via Epic secure chat 7am-7pm After these hours, please refer to coverage provider listed on [email or web address]

## 2019-12-31 NOTE — Progress Notes (Signed)
POST OPERATIVE NOTE:  12/31/2019 Anna Campos [id number]   VITALS: BP 100/71 (BP Location: Right Arm)   Pulse 69   Temp 98.5 F (36.9 C) (Oral)   Resp 20   Ht 5\' 9"  (1.753 m)   Wt 56.5 kg   SpO2 99%   BMI 18.39 kg/m   LABS:  Lab Results  Component Value Date   WBC 16.5 (H) 12/31/2019   HGB 10.5 (L) 12/31/2019   HCT 33.6 (L) 12/31/2019   MCV 96.6 12/31/2019   PLT 368 12/31/2019   BMET    Component Value Date/Time   NA 143 12/29/2019 0418   K 3.8 12/29/2019 0418   CL 107 12/29/2019 0418   CO2 26 12/29/2019 0418   GLUCOSE 99 12/29/2019 0418   BUN 11 12/29/2019 0418   CREATININE 0.77 12/29/2019 0418   CALCIUM 9.1 12/29/2019 0418   GFRNONAA >60 12/29/2019 0418   GFRAA >60 12/29/2019 0418    Lab Results  Component Value Date   INR 1.1 12/18/2019   No results found for: PTT   Anna Campos is status post extraction of remaining teeth with alveoloplasty and preprosthetic surgery as needed in the operating room with general anesthesia on 12/30/2019.  SUBJECTIVE: Patient with minimal discomfort from dental extractions.  Patient denies having any active bleeding.  EXAM: There is no sign of infection, heme, or ooze.  Minimal swelling and bruising is noted.  Sutures are intact.  Patient is healing in by generalized primary closure.  ASSESSMENT: Post operative course is consistent with dental procedures performed in the operating room with general anesthesia Loss of teeth due to extractions Patient is now edentulous. There is atrophy of the edentulous alveolar ridges.  PLAN: 1.  Use chlorhexidine rinses twice daily after breakfast and at bedtime. 2.  Use salt water rinses every 2 hours while awake in between the chlorhexidine rinses. 3.  Advance diet as tolerated.  Use nutritional supplementation per nutrition consult. 4.  Follow-up with primary dentist of her choice for fabrication of upper and lower complete dentures after adequate healing. 5.  Follow-up with  dental medicine for evaluation of healing and suture removal once discharged from this admission.  Sutures, however, will dissolve on their own.   02/29/2020, DDS

## 2019-12-31 NOTE — Progress Notes (Signed)
ANTICOAGULATION CONSULT NOTE   Pharmacy Consult for heparin Indication: pulmonary embolus  Allergies  Allergen Reactions  . Ampicillin Diarrhea    Patient Measurements: Height: 5\' 9"  (175.3 cm) Weight: 124 lb 9 oz (56.5 kg) IBW/kg (Calculated) : 66.2 Heparin Dosing Weight: 56.5 kg  Vital Signs: Temp: 98.5 F (36.9 C) (03/12 0755) Temp Source: Oral (03/12 0755) BP: 100/71 (03/12 0755) Pulse Rate: 69 (03/12 0755)  Labs: Recent Labs    12/29/19 0418 12/29/19 0418 12/29/19 1237 12/30/19 0546 12/31/19 0350  HGB 10.3*   < >  --  10.6* 10.5*  HCT 32.2*  --   --  33.3* 33.6*  PLT 356  --   --  344 368  HEPARINUNFRC 0.77*   < > 0.55 <0.10* 0.38  CREATININE 0.77  --   --   --   --    < > = values in this interval not displayed.    Estimated Creatinine Clearance: 73.4 mL/min (by C-G formula based on SCr of 0.77 mg/dL).   Medications:  Infusions:  . sodium chloride 250 mL (12/30/19 2111)  . cefTRIAXone (ROCEPHIN)  IV 2 g (12/30/19 2112)  . heparin 900 Units/hr (12/30/19 2056)  . lactated ringers Stopped (12/30/19 2116)    Assessment: 54 yo F on IV heparin for PE.  Now s/p dental extractions 3/11 and heparin resumed.  CBC stable, no overt bleeding or complications noted.  Heparin level at goal this AM.  Goal of Therapy:  Heparin level 0.3-0.7 units/ml Monitor platelets by anticoagulation protocol: Yes   Plan:  -Continue IV heparin at current rate. -Daily heparin level and CBC. -F/u plans for oral anticoagulation eventually.  5/11, [facility] Clinical Pharmacist Phone [phone]  12/31/2019 9:04 AM

## 2020-01-01 DIAGNOSIS — E440 Moderate protein-calorie malnutrition: Secondary | ICD-10-CM | POA: Insufficient documentation

## 2020-01-01 LAB — CBC
HCT: 31.8 % — ABNORMAL LOW (ref 36.0–46.0)
Hemoglobin: 9.9 g/dL — ABNORMAL LOW (ref 12.0–15.0)
MCH: 30.4 pg (ref 26.0–34.0)
MCHC: 31.1 g/dL (ref 30.0–36.0)
MCV: 97.5 fL (ref 80.0–100.0)
Platelets: 349 10*3/uL (ref 150–400)
RBC: 3.26 MIL/uL — ABNORMAL LOW (ref 3.87–5.11)
RDW: 15.2 % (ref 11.5–15.5)
WBC: 9.8 10*3/uL (ref 4.0–10.5)
nRBC: 0 % (ref 0.0–0.2)

## 2020-01-01 LAB — GLUCOSE, CAPILLARY: Glucose-Capillary: 96 mg/dL (ref 70–99)

## 2020-01-01 LAB — HEPARIN LEVEL (UNFRACTIONATED): Heparin Unfractionated: 0.41 IU/mL (ref 0.30–0.70)

## 2020-01-01 NOTE — Progress Notes (Addendum)
ANTICOAGULATION CONSULT NOTE   Pharmacy Consult for heparin Indication: pulmonary embolus  Allergies  Allergen Reactions  . Ampicillin Diarrhea    Patient Measurements: Height: 5\' 9"  (175.3 cm) Weight: 122 lb 2.2 oz (55.4 kg) IBW/kg (Calculated) : 66.2 Heparin Dosing Weight: 56.5 kg  Vital Signs: Temp: 98.8 F (37.1 C) (03/13 0502) Temp Source: Oral (03/13 0502) BP: 115/74 (03/13 0502) Pulse Rate: 79 (03/13 0502)  Labs: Recent Labs    12/30/19 0546 12/30/19 0546 12/31/19 0350 01/01/20 0311  HGB 10.6*   < > 10.5* 9.9*  HCT 33.3*  --  33.6* 31.8*  PLT 344  --  368 349  HEPARINUNFRC <0.10*  --  0.38 0.41   < > = values in this interval not displayed.    Estimated Creatinine Clearance: 71.9 mL/min (by C-G formula based on SCr of 0.77 mg/dL).   Medications:  Infusions:  . sodium chloride 250 mL (12/31/19 2147)  . cefTRIAXone (ROCEPHIN)  IV 2 g (12/31/19 2148)  . heparin 900 Units/hr (12/31/19 1206)  . lactated ringers Stopped (12/30/19 2116)  . sodium chloride irrigation      Assessment: 54 yo F on IV heparin for PE.  Now s/p dental extractions 3/11 and heparin resumed.  CBC stable, no overt bleeding or complications noted.  Heparin level at goal this AM at 0.41  Goal of Therapy:  Heparin level 0.3-0.7 units/ml Monitor platelets by anticoagulation protocol: Yes   Plan:  -Continue IV heparin at current rate. -Daily heparin level and CBC. -Plan to hold heparin at midnight Monday night for surgery -F/u plans for oral anticoagulation eventually.  Monday, PharmD PGY1 Pharmacy Resident Phone: [phone] 01/01/2020  7:52 AM  Please check [email or web address] for unit-specific pharmacy phone numbers.

## 2020-01-01 NOTE — Progress Notes (Signed)
Occupational Therapy Treatment Patient Details Name: Anna Campos MRN: [id number] DOB: [birth year] Today's Date: 01/01/2020    History of present illness 54 year old woman with no significant past medical history presenting with syncope and s/p fall. Pt found to have incomplete spincal cord injury, bacteremia, and bilat PEs. N/S following, plan for surgery in 2 weeks.   OT comments  Provided red built up foam and pt was able to self feed jello with R hand with optimal positioning and cues for positioning her arm and spoon to decrease effort.  Pt still has to use more shoulder movement than normal to scoop this.  Pt was so excited to be able to feed herself. She was tired after PT and didn't want to get up again, but she is super motivated!  Follow Up Recommendations  CIR    Equipment Recommendations       Recommendations for Other Services      Precautions / Restrictions Precautions Precautions: Fall Precaution Comments: central cord Restrictions Weight Bearing Restrictions: No       Mobility Bed Mobility            Transfers                     Balance Overall balance assessment: Needs assistance  Sitting balance-Leahy Scale: Fair Sitting balance - Comments: minG with bilateral hands on knees, perfoming LE HEP with minG-minA Postural control: Posterior lean                                 ADL either performed or assessed with clinical judgement   ADL   Eating/Feeding: Minimal assistance;Bed level                                     General ADL Comments: focus of session was on self feeding. Pt is R handed, but has more strength in L.  Initially tried built up foam with L, but positioning was awkward and didn't work.  Switched to R hand and much more sucessful with modifications suggested. Pt ate her jello during this session. She does have to overuse shoulder to scoop and she uses bil UEs, L to tilt cup at times.  Pt looks  most natural when she relaxes shoulder and lets elbow do the bulk of the work.  Cued to have sppon approach with the rounded side rather than long edge.  Used wet paper towel to help stabilize jello container but extra soup bowl would also be helpful.  She used a plastic spoon, so tape applied to handle to hold this more securely in the foam.  Pt is managing styrofoam cups now..  Environment does have to be set up optimally to help her, ie. table distance and height and things need to be moved periodically     Vision       Perception     Praxis      Cognition Arousal/Alertness: Awake/alert Behavior During Therapy: WFL for tasks assessed/performed Overall Cognitive Status: Within Functional Limits for tasks assessed                                          Exercises    Shoulder Instructions       General Comments  pt sat EOB with PT earlier, and declined further mobility this pm.  Noted pt has an order for soft collar, and there was one in box in her room.  Checked with RN--no reason it wasn't on, so applied this.  Pt reported more comfort/support with this on    Pertinent Vitals/ Pain       Pain Assessment: Faces Faces Pain Scale: Hurts a little bit Pain Location: spasms in BLE Pain Descriptors / Indicators: Grimacing Pain Intervention(s): Monitored during session  Home Living                                          Prior Functioning/Environment              Frequency  Min 3X/week        Progress Toward Goals  OT Goals(current goals can now be found in the care plan section)  Progress towards OT goals: Progressing toward goals  Acute Rehab OT Goals Patient Stated Goal: "I want to walk again"  Plan      Co-evaluation                 AM-PAC OT "6 Clicks" Daily Activity     Outcome Measure   Help from another person eating meals?: A Little Help from another person taking care of personal grooming?: A Lot Help  from another person toileting, which includes using toliet, bedpan, or urinal?: A Lot Help from another person bathing (including washing, rinsing, drying)?: A Lot Help from another person to put on and taking off regular upper body clothing?: A Lot Help from another person to put on and taking off regular lower body clothing?: Total 6 Click Score: 12    End of Session    OT Visit Diagnosis: Unsteadiness on feet (R26.81);Muscle weakness (generalized) (M62.81)   Activity Tolerance Patient tolerated treatment well   Patient Left in bed;with bed alarm set;with call bell/phone within reach   Nurse Communication          Time: 3762-8315 OT Time Calculation (min): 28 min  Charges: OT General Charges $OT Visit: 1 Visit OT Treatments $Self Care/Home Management : 23-37 mins  Callee Rohrig S, OTR/L Acute Rehabilitation Services 01/01/2020   Michaella Imai 01/01/2020, 3:21 PM

## 2020-01-01 NOTE — Progress Notes (Signed)
Patient resting comfortably during shift report. Denies complaints.  

## 2020-01-01 NOTE — Progress Notes (Signed)
PROGRESS NOTE    Anna Campos  YIR:[id number] DOB: [birth year] DOA: 12/18/2019 PCP: Farris Has, MD     Brief Narrative:  Anna Campos is a 54 year old female with no significant past medical history presenting with syncope.  She has been having generalized fatigue.  She has had poor p.o. intake and has not eaten in the last day.  She stood up this evening and passed out.  Family heard her fall.  She did hit her head.  She was told she was not unconscious for a long period of time.  She was noted by EMS to be hypotensive and she was given a small fluid bolus.  She was admitted to ICU due to syncope, hypovolemic shock.  Due to EKG changes, cardiology was consulted.  Patient was weaned off Levophed and transferred to [facility] hospitalist service on 3/1.  She was found to have Streptococcus bacteremia.  Infectious disease consulted and recommended 2 weeks of ceftriaxone.  Due to bacteremia, dentistry was also consulted.  Patient developed anemia during her hospitalization, GI consulted and patient underwent EGD on 3/8.  Patient underwent dental extraction on 3/11.  New events last 24 hours / Subjective: Doing well this morning without physical complaints. Very pleased with her progress in mobility. Awaiting cervical spinal surgery planned for Tuesday.   Assessment & Plan:   Active Problems:   Syncope   Quadriplegia (HCC)   Hypotension   Bacteremia   Poor dentition   Spinal stenosis of cervical region   Malnutrition of moderate degree   Hypovolemic shock and syncope -Admitted to ICU and now weaned off of Levophed.  Cortisol 23.5 -Appreciate cardiology; EKG shows nonspecific findings and pretest probability for CAD is low.  Troponin negative.  Echocardiogram with normal LVEF, normal RV size and function without valvular abnormalities.  No sign of right heart strain. No further cardiac work-up recommended -Stable  Strep constellatus bacteremia -ID consulted -Repeat blood cultures 3/1  negative -Continue rocephin for 2 weeks with start date 12/19/2019-01/01/2020  Dental caries -CT maxillofacial on 12/18/2019 without mention of any dental caries or abscess -Orthopantogram on 12/24/2019 showed numerous dental caries.  Mild lucency around many of the remaining lower teeth. -S/p dental extraction 3/11 with Dr. Robin Searing. Follow-up with primary dentist of her choice for fabrication of upper and lower complete dentures after adequate healing. Follow-up with dental medicine for evaluation of healing and suture removal once discharged from this admission.   PE -CTA chest: Small nonocclusive pulmonary emboli bilaterally involving segmental and subsegmental sized vessels -DVT US negative -IV heparin.  Plan to transition to DOAC prior to discharge once surgical intervention is complete  Bilateral extremity weakness with incomplete spinal cord injury C3-C6 -Distal lower extremity weaker compared to proximal -Neurosurgery following, tentative surgical multilevel cervical decompressive procedure planned 3/16 -CIR post op  Acute normocytic anemia/GI bleed/acute blood loss anemia -Hemoglobin dropped to 6.8  (see on admission hemoglobin approximately 9-10) -Denies hematuria or hematochezia, melena. FOBT positive -Anemia panel showed adequate iron and storage -Gastroenterology consulted and appreciated -Transfused 2u PRBC on 3/6 -Status post EGD on 12/27/2019 showing mild erythematous mucosa in the entire gastric cavity.  2 superficial prepyloric ulcers 1 with clean base the other with pigmentation.  1 Endo Clip was deployed to close the mucosal defect.  Erythematous duodenal bulb.  PPI BID recommended for 2 months.  Okay to restart IV heparin. H. pylori serology positive, start triple therapy clarithromycin/amoxcillin/PPI for 14 days starting 3/11 -Hemoglobin remains stable   DVT prophylaxis: IV heparin Code  Status: Full code Family Communication: No family at bedside Disposition Plan:   . Patient is from home prior to admission. . Currently in-hospital treatment needed due to neurosurgery planned next week, tentatively planned for 3/16 . Suspect patient will discharge CIR next week   Consultants:   PCCM admission  Cardiology  ID   Neurosurgery   Dentistry  Procedures:   EGD 3/8  Dental extraction 3/11   Antimicrobials:  Anti-infectives (From admission, onward)   Start     Dose/Rate Route Frequency Ordered Stop   12/30/19 1215  amoxicillin (AMOXIL) capsule 1,000 mg     1,000 mg Oral Every 12 hours 12/30/19 1213 01/13/20 0959   12/30/19 1215  clarithromycin (BIAXIN) tablet 500 mg     500 mg Oral Every 12 hours 12/30/19 1213 01/13/20 0959   12/30/19 0800  cefTRIAXone (ROCEPHIN) 2 g in sodium chloride 0.9 % 100 mL IVPB  Status:  Discontinued     2 g 200 mL/hr over 30 Minutes Intravenous To Surgery 12/30/19 0748 12/30/19 1056   12/21/19 1000  cefTRIAXone (ROCEPHIN) 2 g in sodium chloride 0.9 % 100 mL IVPB     2 g 200 mL/hr over 30 Minutes Intravenous Every 24 hours 12/21/19 0948 01/02/20 2359   12/20/19 2130  vancomycin (VANCOREADY) IVPB 750 mg/150 mL  Status:  Discontinued     750 mg 150 mL/hr over 60 Minutes Intravenous Every 12 hours 12/20/19 1825 12/21/19 0948   12/20/19 1530  ceFAZolin (ANCEF) IVPB 2g/100 mL premix  Status:  Discontinued     2 g 200 mL/hr over 30 Minutes Intravenous Every 8 hours 12/20/19 1526 12/20/19 1814   12/20/19 1000  vancomycin (VANCOREADY) IVPB 750 mg/150 mL  Status:  Discontinued     750 mg 150 mL/hr over 60 Minutes Intravenous Every 12 hours 12/20/19 0054 12/20/19 1019   12/20/19 0130  vancomycin (VANCOREADY) IVPB 1250 mg/250 mL     1,250 mg 166.7 mL/hr over 90 Minutes Intravenous  Once 12/20/19 0054 12/20/19 0356       Objective: Vitals:   12/31/19 0755 12/31/19 1943 01/01/20 0502 01/01/20 0515  BP: 100/71 108/71 115/74   Pulse: 69 80 79   Resp: 20 18 18    Temp: 98.5 F (36.9 C) 98.7 F (37.1 C) 98.8 F (37.1  C)   TempSrc: Oral Oral Oral   SpO2: 99% 98% 99%   Weight:    55.4 kg  Height:        Intake/Output Summary (Last 24 hours) at 01/01/2020 1051 Last data filed at 01/01/2020 0833 Gross per 24 hour  Intake 545.72 ml  Output 500 ml  Net 45.72 ml   Filed Weights   12/30/19 0506 12/31/19 0435 01/01/20 0515  Weight: 57.3 kg 56.5 kg 55.4 kg    Examination: General exam: Appears calm and comfortable  Respiratory system: Clear to auscultation. Respiratory effort normal. Cardiovascular system: S1 & S2 heard, RRR. No pedal edema. Gastrointestinal system: Abdomen is nondistended, soft and nontender. Normal bowel sounds heard. Central nervous system: Alert and oriented. Speech clear  Extremities: Symmetric in appearance bilaterally  Skin: No rashes, lesions or ulcers on exposed skin  Psychiatry: Judgement and insight appear stable. Mood & affect appropriate.     Data Reviewed: I have personally reviewed following labs and imaging studies  CBC: Recent Labs  Lab 12/28/19 0453 12/29/19 0418 12/30/19 0546 12/31/19 0350 01/01/20 0311  WBC 10.4 8.0 7.7 16.5* 9.8  HGB 10.7* 10.3* 10.6* 10.5* 9.9*  HCT 32.9* 32.2*  33.3* 33.6* 31.8*  MCV 92.4 95.5 96.2 96.6 97.5  PLT 320 356 344 368 349   Basic Metabolic Panel: Recent Labs  Lab 12/26/19 0405 12/27/19 0418 12/29/19 0418  NA 142 141 143  K 3.7 3.8 3.8  CL 109 108 107  CO2 26 23 26   GLUCOSE 92 88 99  BUN 9 11 11   CREATININE 0.76 0.84 0.77  CALCIUM 8.7* 8.7* 9.1   GFR: Estimated Creatinine Clearance: 71.9 mL/min (by C-G formula based on SCr of 0.77 mg/dL). Liver Function Tests: No results for input(s): AST, ALT, ALKPHOS, BILITOT, PROT, ALBUMIN in the last 168 hours. No results for input(s): LIPASE, AMYLASE in the last 168 hours. No results for input(s): AMMONIA in the last 168 hours. Coagulation Profile: No results for input(s): INR, PROTIME in the last 168 hours. Cardiac Enzymes: No results for input(s): CKTOTAL, CKMB,  CKMBINDEX, TROPONINI in the last 168 hours. BNP (last 3 results) No results for input(s): PROBNP in the last 8760 hours. HbA1C: No results for input(s): HGBA1C in the last 72 hours. CBG: Recent Labs  Lab 01/01/20 0602  GLUCAP 96   Lipid Profile: No results for input(s): CHOL, HDL, LDLCALC, TRIG, CHOLHDL, LDLDIRECT in the last 72 hours. Thyroid Function Tests: No results for input(s): TSH, T4TOTAL, FREET4, T3FREE, THYROIDAB in the last 72 hours. Anemia Panel: No results for input(s): VITAMINB12, FOLATE, FERRITIN, TIBC, IRON, RETICCTPCT in the last 72 hours. Sepsis Labs: No results for input(s): PROCALCITON, LATICACIDVEN in the last 168 hours.  No results found for this or any previous visit (from the past 240 hour(s)).    Radiology Studies: No results found.    Scheduled Meds: . amoxicillin  1,000 mg Oral Q12H   And  . clarithromycin  500 mg Oral Q12H   And  . pantoprazole  40 mg Oral BID AC  . chlorhexidine  15 mL Mouth/Throat BID  . Chlorhexidine Gluconate Cloth  6 each Topical Daily  . feeding supplement  1 Container Oral TID BM  . loratadine  10 mg Oral Daily  . multivitamin with minerals  1 tablet Oral Daily   Continuous Infusions: . sodium chloride Stopped (12/31/19 2240)  . cefTRIAXone (ROCEPHIN)  IV Stopped (12/31/19 2220)  . heparin 900 Units/hr (12/31/19 1206)  . lactated ringers Stopped (12/30/19 2116)  . sodium chloride irrigation       LOS: 13 days      Time spent: 20 minutes   02/29/20, DO [facility] 01/01/2020, 10:51 AM   Available via Epic secure chat 7am-7pm After these hours, please refer to coverage provider listed on [email or web address]

## 2020-01-01 NOTE — Progress Notes (Signed)
Physical Therapy Treatment Patient Details Name: Anna Campos MRN: [id number] DOB: 1966-02-08 Today's Date: 01/01/2020    History of Present Illness 54 year old woman with no significant past medical history presenting with syncope and s/p fall. Pt found to have incomplete spincal cord injury, bacteremia, and bilat PEs. N/S following, plan for surgery in 2 weeks.    PT Comments    Pt declining OOB mobility and attempts at standing this session. Pt tolerates sitting at edge of bed, bed mobility, and therapeutic exercise well. Pt demonstrates improved ROM of all extremities with less significant tone and spasticity in LEs. Pt remains generally weak, especially with hip rotators to stabilize during mobility. Pt will benefit from continued aggressive mobilization to improve strength, balance, and function.    Follow Up Recommendations  CIR     Equipment Recommendations  Wheelchair (measurements PT);Wheelchair cushion (measurements PT)    Recommendations for Other Services       Precautions / Restrictions Precautions Precautions: Fall Precaution Comments: central cord Restrictions Weight Bearing Restrictions: No    Mobility  Bed Mobility Overal bed mobility: Needs Assistance Bed Mobility: Supine to Sit;Sit to Supine Rolling: Min assist Sidelying to sit: Min assist   Sit to supine: Max assist   General bed mobility comments: pt able to scoot up in bed with PT stabilization of bilateral feet to push through and bridge  Transfers Overall transfer level: (pt declines transfer attempts)                  Ambulation/Gait                 Stairs             Wheelchair Mobility    Modified Rankin (Stroke Patients Only)       Balance Overall balance assessment: Needs assistance Sitting-balance support: Bilateral upper extremity supported;Feet supported Sitting balance-Leahy Scale: Fair Sitting balance - Comments: minG with bilateral hands on knees,  perfoming LE HEP with minG-minA Postural control: Posterior lean                                  Cognition Arousal/Alertness: Awake/alert Behavior During Therapy: WFL for tasks assessed/performed Overall Cognitive Status: Within Functional Limits for tasks assessed                                        Exercises General Exercises - Lower Extremity Ankle Circles/Pumps: AROM;Both;10 reps Long Arc Quad: AROM;Both;10 reps Heel Slides: AROM;Both;5 reps Hip ABduction/ADduction: AROM;Both;10 reps;Seated Hip Flexion/Marching: AROM;Both;10 reps;Seated Other Exercises Other Exercises: bridges, PT stabilizing feet, 10 reps Other Exercises: Hip IR/ER in supine with knee and hip flexion (clam shell in supine) 10 reps    General Comments        Pertinent Vitals/Pain Pain Assessment: Faces Faces Pain Scale: Hurts a little bit Pain Location: spasms in BLE Pain Descriptors / Indicators: Grimacing Pain Intervention(s): Limited activity within patient's tolerance    Home Living                      Prior Function            PT Goals (current goals can now be found in the care plan section) Acute Rehab PT Goals Patient Stated Goal: "I want to walk again" Progress towards PT goals: Progressing toward goals  Frequency    Min 4X/week      PT Plan Current plan remains appropriate    Co-evaluation              AM-PAC PT "6 Clicks" Mobility   Outcome Measure  Help needed turning from your back to your side while in a flat bed without using bedrails?: A Little Help needed moving from lying on your back to sitting on the side of a flat bed without using bedrails?: A Lot Help needed moving to and from a bed to a chair (including a wheelchair)?: A Lot Help needed standing up from a chair using your arms (e.g., wheelchair or bedside chair)?: A Lot Help needed to walk in hospital room?: Total Help needed climbing 3-5 steps with a  railing? : Total 6 Click Score: 11    End of Session   Activity Tolerance: Patient tolerated treatment well Patient left: in bed;with call bell/phone within reach;with bed alarm set Nurse Communication: Mobility status PT Visit Diagnosis: Unsteadiness on feet (R26.81);Other abnormalities of gait and mobility (R26.89);Repeated falls (R29.6);Muscle weakness (generalized) (M62.81);History of falling (Z91.81);Ataxic gait (R26.0);Difficulty in walking, not elsewhere classified (R26.2)     Time: 7867-6720 PT Time Calculation (min) (ACUTE ONLY): 25 min  Charges:  $Therapeutic Exercise: 8-22 mins $Therapeutic Activity: 8-22 mins                     Zenaida Niece, PT, DPT Acute Rehabilitation Pager: [phone]    Zenaida Niece 01/01/2020, 2:11 PM

## 2020-01-01 NOTE — Progress Notes (Signed)
Patient was able to feed herself with assistive devices provided by OT this evening. Pt is ecstatic to show her husband the progress shes made in function.

## 2020-01-01 NOTE — Progress Notes (Signed)
Patient cleaned/linen changed, again. PT is now working with patient, husband left to get food and 'take a break'.

## 2020-01-02 LAB — CBC
HCT: 33.6 % — ABNORMAL LOW (ref 36.0–46.0)
Hemoglobin: 10.6 g/dL — ABNORMAL LOW (ref 12.0–15.0)
MCH: 30.5 pg (ref 26.0–34.0)
MCHC: 31.5 g/dL (ref 30.0–36.0)
MCV: 96.8 fL (ref 80.0–100.0)
Platelets: 360 10*3/uL (ref 150–400)
RBC: 3.47 MIL/uL — ABNORMAL LOW (ref 3.87–5.11)
RDW: 14.8 % (ref 11.5–15.5)
WBC: 7.4 10*3/uL (ref 4.0–10.5)
nRBC: 0 % (ref 0.0–0.2)

## 2020-01-02 LAB — HEPARIN LEVEL (UNFRACTIONATED)
Heparin Unfractionated: 0.19 IU/mL — ABNORMAL LOW (ref 0.30–0.70)
Heparin Unfractionated: 0.76 IU/mL — ABNORMAL HIGH (ref 0.30–0.70)
Heparin Unfractionated: 0.63 IU/mL (ref 0.30–0.70)

## 2020-01-02 NOTE — Plan of Care (Signed)

## 2020-01-02 NOTE — Progress Notes (Signed)
ANTICOAGULATION CONSULT NOTE   Pharmacy Consult for heparin Indication: pulmonary embolus  Allergies  Allergen Reactions  . Ampicillin Diarrhea    Patient Measurements: Height: 5\' 9"  (175.3 cm) Weight: 121 lb 7.6 oz (55.1 kg) IBW/kg (Calculated) : 66.2 Heparin Dosing Weight: 56.5 kg  Vital Signs: Temp: 98.7 F (37.1 C) (03/14 2017) Temp Source: Oral (03/14 2017) BP: 100/62 (03/14 2017) Pulse Rate: 87 (03/14 2017)  Labs: Recent Labs    12/31/19 0350 12/31/19 0350 01/01/20 0311 01/01/20 0311 01/02/20 0524 01/02/20 1428 01/02/20 2138  HGB 10.5*   < > 9.9*  --  10.6*  --   --   HCT 33.6*  --  31.8*  --  33.6*  --   --   PLT 368  --  349  --  360  --   --   HEPARINUNFRC 0.38   < > 0.41   < > 0.19* 0.76* 0.63   < > = values in this interval not displayed.    Estimated Creatinine Clearance: 71.6 mL/min (by C-G formula based on SCr of 0.77 mg/dL).   Medications:  Infusions:  . sodium chloride Stopped (12/31/19 2240)  . heparin 950 Units/hr (01/02/20 2127)  . sodium chloride irrigation      Assessment: 54 yo F on IV heparin for PE.  Now s/p dental extractions 3/11 and heparin resumed.   Heparin level 0.63 at goal on heparin drip 950 units/hr. CBC stable, no overt bleeding or infusion issues confirmed with RN.   Goal of Therapy:  Heparin level 0.3-0.7 units/ml Monitor platelets by anticoagulation protocol: Yes   Plan:  Continue IV heparin  950 units/hr -Daily heparin level and CBC. -Plan to hold heparin at midnight Monday night for surgery -F/u plans for oral anticoagulation eventually.   Tuesday Pharm.D. CPP, BCPS Clinical Pharmacist [phone] 01/02/2020 10:34 PM    Please check [email or web address] for unit-specific pharmacy phone numbers.

## 2020-01-02 NOTE — Progress Notes (Signed)
ANTICOAGULATION CONSULT NOTE   Pharmacy Consult for heparin Indication: pulmonary embolus  Allergies  Allergen Reactions  . Ampicillin Diarrhea    Patient Measurements: Height: 5\' 9"  (175.3 cm) Weight: 121 lb 7.6 oz (55.1 kg) IBW/kg (Calculated) : 66.2 Heparin Dosing Weight: 56.5 kg  Vital Signs: Temp: 99.1 F (37.3 C) (03/14 0913) Temp Source: Oral (03/14 0913) BP: 92/49 (03/14 0913) Pulse Rate: 70 (03/14 0913)  Labs: Recent Labs    12/31/19 0350 12/31/19 0350 01/01/20 0311 01/02/20 0524 01/02/20 1428  HGB 10.5*   < > 9.9* 10.6*  --   HCT 33.6*  --  31.8* 33.6*  --   PLT 368  --  349 360  --   HEPARINUNFRC 0.38   < > 0.41 0.19* 0.76*   < > = values in this interval not displayed.    Estimated Creatinine Clearance: 71.6 mL/min (by C-G formula based on SCr of 0.77 mg/dL).   Medications:  Infusions:  . sodium chloride Stopped (12/31/19 2240)  . heparin 1,050 Units/hr (01/02/20 0912)  . sodium chloride irrigation      Assessment: 54 yo F on IV heparin for PE.  Now s/p dental extractions 3/11 and heparin resumed.   Heparin level now supratherapeutic from 0.19 to 0.76 after increasing drip rate from 900 to 1050 units/hr. Confirmed this morning that heparin drip had not been turned off prior to subtherapeutic level, but pt had been therapeutic on 900 units/hr for a few days. Will decrease dose again and check another HL. CBC stable, no overt bleeding or infusion issues confirmed with RN.   Goal of Therapy:  Heparin level 0.3-0.7 units/ml Monitor platelets by anticoagulation protocol: Yes   Plan:  -Decrease IV heparin to 950 units/hr -Check 6-hr HL -Daily heparin level and CBC. -Plan to hold heparin at midnight Monday night for surgery -F/u plans for oral anticoagulation eventually.  Monday, PharmD PGY1 Pharmacy Resident Phone: [phone] 01/02/2020  3:06 PM  Please check [email or web address] for unit-specific pharmacy phone numbers.

## 2020-01-02 NOTE — Progress Notes (Signed)
PROGRESS NOTE    Anna Campos  QQV:[id number] DOB: [birth year] DOA: 12/18/2019 PCP: Farris Has, MD     Brief Narrative:  Anna Campos is a 54 year old female with no significant past medical history presenting with syncope.  She has been having generalized fatigue.  She has had poor p.o. intake and has not eaten in the last day.  She stood up this evening and passed out.  Family heard her fall.  She did hit her head.  She was told she was not unconscious for a long period of time.  She was noted by EMS to be hypotensive and she was given a small fluid bolus.  She was admitted to ICU due to syncope, hypovolemic shock.  Due to EKG changes, cardiology was consulted.  Patient was weaned off Levophed and transferred to [facility] on 3/1.  She was found to have Streptococcus bacteremia.  Infectious disease consulted and recommended 2 weeks of ceftriaxone.  Due to bacteremia, dentistry was also consulted.  Patient developed anemia during her hospitalization, GI consulted and patient underwent EGD on 3/8.  Patient underwent dental extraction on 3/11.  New events last 24 hours / Subjective: No complaints this morning  Assessment & Plan:   Active Problems:   Syncope   Quadriplegia (HCC)   Hypotension   Bacteremia   Poor dentition   Spinal stenosis of cervical region   Malnutrition of moderate degree   Hypovolemic shock and syncope -Admitted to ICU and now weaned off of Levophed.  Cortisol 23.5 -Appreciate cardiology; EKG shows nonspecific findings and pretest probability for CAD is low.  Troponin negative.  Echocardiogram with normal LVEF, normal RV size and function without valvular abnormalities.  No sign of right heart strain. No further cardiac work-up recommended -Stable  Strep constellatus bacteremia -ID consulted -Repeat blood cultures 3/1 negative -Completed rocephin for 2 weeks 12/19/2019-01/01/2020  Dental caries -CT maxillofacial on 12/18/2019 without mention of any  dental caries or abscess -Orthopantogram on 12/24/2019 showed numerous dental caries.  Mild lucency around many of the remaining lower teeth. -S/p dental extraction 3/11 with Dr. Robin Searing. Follow-up with primary dentist of her choice for fabrication of upper and lower complete dentures after adequate healing. Follow-up with dental medicine for evaluation of healing and suture removal once discharged from this admission.   PE -CTA chest: Small nonocclusive pulmonary emboli bilaterally involving segmental and subsegmental sized vessels -DVT US negative -IV heparin.  Plan to transition to DOAC prior to discharge once surgical intervention is complete  Bilateral extremity weakness with incomplete spinal cord injury C3-C6 -Distal lower extremity weaker compared to proximal -Neurosurgery following, tentative surgical multilevel cervical decompressive procedure planned 3/16 -CIR post op  Acute normocytic anemia/GI bleed/acute blood loss anemia -Hemoglobin dropped to 6.8  (see on admission hemoglobin approximately 9-10) -Denies hematuria or hematochezia, melena. FOBT positive -Anemia panel showed adequate iron and storage -Gastroenterology consulted and appreciated -Transfused 2u PRBC on 3/6 -Status post EGD on 12/27/2019 showing mild erythematous mucosa in the entire gastric cavity.  2 superficial prepyloric ulcers 1 with clean base the other with pigmentation.  1 Endo Clip was deployed to close the mucosal defect.  Erythematous duodenal bulb.  PPI BID recommended for 2 months.  Okay to restart IV heparin. H. pylori serology positive, start triple therapy clarithromycin/amoxcillin/PPI for 14 days starting 3/11 -Hemoglobin remains stable   DVT prophylaxis: IV heparin Code Status: Full code Family Communication: No family at bedside Disposition Plan:  . Patient is from home prior to admission. Marland Kitchen  Currently in-hospital treatment needed due to neurosurgery planned next week, tentatively planned for  3/16 . Suspect patient will discharge CIR next week   Consultants:   PCCM admission  Cardiology  ID   Neurosurgery   Dentistry  Procedures:   EGD 3/8  Dental extraction 3/11   Antimicrobials:  Anti-infectives (From admission, onward)   Start     Dose/Rate Route Frequency Ordered Stop   12/30/19 1215  amoxicillin (AMOXIL) capsule 1,000 mg     1,000 mg Oral Every 12 hours 12/30/19 1213 01/13/20 0959   12/30/19 1215  clarithromycin (BIAXIN) tablet 500 mg     500 mg Oral Every 12 hours 12/30/19 1213 01/13/20 0959   12/30/19 0800  cefTRIAXone (ROCEPHIN) 2 g in sodium chloride 0.9 % 100 mL IVPB  Status:  Discontinued     2 g 200 mL/hr over 30 Minutes Intravenous To Surgery 12/30/19 0748 12/30/19 1056   12/21/19 1000  cefTRIAXone (ROCEPHIN) 2 g in sodium chloride 0.9 % 100 mL IVPB     2 g 200 mL/hr over 30 Minutes Intravenous Every 24 hours 12/21/19 0948 01/01/20 2110   12/20/19 2130  vancomycin (VANCOREADY) IVPB 750 mg/150 mL  Status:  Discontinued     750 mg 150 mL/hr over 60 Minutes Intravenous Every 12 hours 12/20/19 1825 12/21/19 0948   12/20/19 1530  ceFAZolin (ANCEF) IVPB 2g/100 mL premix  Status:  Discontinued     2 g 200 mL/hr over 30 Minutes Intravenous Every 8 hours 12/20/19 1526 12/20/19 1814   12/20/19 1000  vancomycin (VANCOREADY) IVPB 750 mg/150 mL  Status:  Discontinued     750 mg 150 mL/hr over 60 Minutes Intravenous Every 12 hours 12/20/19 0054 12/20/19 1019   12/20/19 0130  vancomycin (VANCOREADY) IVPB 1250 mg/250 mL     1,250 mg 166.7 mL/hr over 90 Minutes Intravenous  Once 12/20/19 0054 12/20/19 0356       Objective: Vitals:   01/01/20 1149 01/01/20 2023 01/02/20 0455 01/02/20 0913  BP: (!) 107/59 (!) 108/54 107/68 (!) 92/49  Pulse: 78 80 65 70  Resp: 20 16 16 18   Temp: 98.8 F (37.1 C) 99.2 F (37.3 C) 98.9 F (37.2 C) 99.1 F (37.3 C)  TempSrc: Oral Oral Oral Oral  SpO2: 99% 98% 100% 100%  Weight:   55.1 kg   Height:         Intake/Output Summary (Last 24 hours) at 01/02/2020 0936 Last data filed at 01/02/2020 0505 Gross per 24 hour  Intake 191.25 ml  Output 1200 ml  Net -1008.75 ml   Filed Weights   12/31/19 0435 01/01/20 0515 01/02/20 0455  Weight: 56.5 kg 55.4 kg 55.1 kg    Examination: General exam: Appears calm and comfortable  Respiratory system: Clear to auscultation. Respiratory effort normal. Cardiovascular system: S1 & S2 heard, RRR. No pedal edema. Gastrointestinal system: Abdomen is nondistended, soft and nontender. Normal bowel sounds heard. Central nervous system: Alert and oriented. Speech clear  Extremities: Symmetric in appearance bilaterally  Skin: No rashes, lesions or ulcers on exposed skin  Psychiatry: Judgement and insight appear stable. Mood & affect appropriate.     Data Reviewed: I have personally reviewed following labs and imaging studies  CBC: Recent Labs  Lab 12/29/19 0418 12/30/19 0546 12/31/19 0350 01/01/20 0311 01/02/20 0524  WBC 8.0 7.7 16.5* 9.8 7.4  HGB 10.3* 10.6* 10.5* 9.9* 10.6*  HCT 32.2* 33.3* 33.6* 31.8* 33.6*  MCV 95.5 96.2 96.6 97.5 96.8  PLT 356 344 368  349 360   Basic Metabolic Panel: Recent Labs  Lab 12/27/19 0418 12/29/19 0418  NA 141 143  K 3.8 3.8  CL 108 107  CO2 23 26  GLUCOSE 88 99  BUN 11 11  CREATININE 0.84 0.77  CALCIUM 8.7* 9.1   GFR: Estimated Creatinine Clearance: 71.6 mL/min (by C-G formula based on SCr of 0.77 mg/dL). Liver Function Tests: No results for input(s): AST, ALT, ALKPHOS, BILITOT, PROT, ALBUMIN in the last 168 hours. No results for input(s): LIPASE, AMYLASE in the last 168 hours. No results for input(s): AMMONIA in the last 168 hours. Coagulation Profile: No results for input(s): INR, PROTIME in the last 168 hours. Cardiac Enzymes: No results for input(s): CKTOTAL, CKMB, CKMBINDEX, TROPONINI in the last 168 hours. BNP (last 3 results) No results for input(s): PROBNP in the last 8760  hours. HbA1C: No results for input(s): HGBA1C in the last 72 hours. CBG: Recent Labs  Lab 01/01/20 0602  GLUCAP 96   Lipid Profile: No results for input(s): CHOL, HDL, LDLCALC, TRIG, CHOLHDL, LDLDIRECT in the last 72 hours. Thyroid Function Tests: No results for input(s): TSH, T4TOTAL, FREET4, T3FREE, THYROIDAB in the last 72 hours. Anemia Panel: No results for input(s): VITAMINB12, FOLATE, FERRITIN, TIBC, IRON, RETICCTPCT in the last 72 hours. Sepsis Labs: No results for input(s): PROCALCITON, LATICACIDVEN in the last 168 hours.  No results found for this or any previous visit (from the past 240 hour(s)).    Radiology Studies: No results found.    Scheduled Meds: . amoxicillin  1,000 mg Oral Q12H   And  . clarithromycin  500 mg Oral Q12H   And  . pantoprazole  40 mg Oral BID AC  . chlorhexidine  15 mL Mouth/Throat BID  . feeding supplement  1 Container Oral TID BM  . loratadine  10 mg Oral Daily  . multivitamin with minerals  1 tablet Oral Daily   Continuous Infusions: . sodium chloride Stopped (12/31/19 2240)  . heparin 1,050 Units/hr (01/02/20 0912)  . sodium chloride irrigation       LOS: 14 days      Time spent: 20 minutes   Noralee Stain, DO [facility] 01/02/2020, 9:36 AM   Available via Epic secure chat 7am-7pm After these hours, please refer to coverage provider listed on [email or web address]

## 2020-01-02 NOTE — Progress Notes (Signed)
ANTICOAGULATION CONSULT NOTE   Pharmacy Consult for heparin Indication: pulmonary embolus  Allergies  Allergen Reactions  . Ampicillin Diarrhea    Patient Measurements: Height: 5\' 9"  (175.3 cm) Weight: 121 lb 7.6 oz (55.1 kg) IBW/kg (Calculated) : 66.2 Heparin Dosing Weight: 56.5 kg  Vital Signs: Temp: 98.9 F (37.2 C) (03/14 0455) Temp Source: Oral (03/14 0455) BP: 107/68 (03/14 0455) Pulse Rate: 65 (03/14 0455)  Labs: Recent Labs    12/31/19 0350 12/31/19 0350 01/01/20 0311 01/02/20 0524  HGB 10.5*   < > 9.9* 10.6*  HCT 33.6*  --  31.8* 33.6*  PLT 368  --  349 360  HEPARINUNFRC 0.38  --  0.41 0.19*   < > = values in this interval not displayed.    Estimated Creatinine Clearance: 71.6 mL/min (by C-G formula based on SCr of 0.77 mg/dL).   Medications:  Infusions:  . sodium chloride Stopped (12/31/19 2240)  . heparin 900 Units/hr (01/01/20 1726)  . sodium chloride irrigation      Assessment: 54 yo F on IV heparin for PE.  Now s/p dental extractions 3/11 and heparin resumed.  CBC stable, no overt bleeding or complications noted.  Heparin level at subtherapeutic on drip rate 900 units/hr. Confirmed with RN that heparin drip was not stopped overnight.   Goal of Therapy:  Heparin level 0.3-0.7 units/ml Monitor platelets by anticoagulation protocol: Yes   Plan:  -Increase IV heparin to 1050 units/hr -Check 6-hr HL -Daily heparin level and CBC. -Plan to hold heparin at midnight Monday night for surgery -F/u plans for oral anticoagulation eventually.  Tuesday, PharmD PGY1 Pharmacy Resident Phone: [phone] 01/02/2020  7:43 AM  Please check [email or web address] for unit-specific pharmacy phone numbers.

## 2020-01-03 LAB — HEPARIN LEVEL (UNFRACTIONATED)
Heparin Unfractionated: 0.11 IU/mL — ABNORMAL LOW (ref 0.30–0.70)
Heparin Unfractionated: 0.24 IU/mL — ABNORMAL LOW (ref 0.30–0.70)

## 2020-01-03 LAB — CBC
HCT: 34.4 % — ABNORMAL LOW (ref 36.0–46.0)
Hemoglobin: 10.8 g/dL — ABNORMAL LOW (ref 12.0–15.0)
MCH: 30.4 pg (ref 26.0–34.0)
MCHC: 31.4 g/dL (ref 30.0–36.0)
MCV: 96.9 fL (ref 80.0–100.0)
Platelets: 355 10*3/uL (ref 150–400)
RBC: 3.55 MIL/uL — ABNORMAL LOW (ref 3.87–5.11)
RDW: 14.2 % (ref 11.5–15.5)
WBC: 6.9 10*3/uL (ref 4.0–10.5)
nRBC: 0 % (ref 0.0–0.2)

## 2020-01-03 MED ORDER — HEPARIN (PORCINE) 25000 UT/250ML-% IV SOLN
950.0000 [IU]/h | INTRAVENOUS | Status: AC
  Administered 2020-01-03: 950 [IU]/h via INTRAVENOUS
  Filled 2020-01-03: qty 250

## 2020-01-03 MED ORDER — DEXAMETHASONE SODIUM PHOSPHATE 10 MG/ML IJ SOLN
10.0000 mg | Freq: Once | INTRAMUSCULAR | Status: AC
  Administered 2020-01-03: 10 mg via INTRAVENOUS
  Filled 2020-01-03: qty 1

## 2020-01-03 MED ORDER — CHLORHEXIDINE GLUCONATE CLOTH 2 % EX PADS
6.0000 | MEDICATED_PAD | Freq: Once | CUTANEOUS | Status: AC
  Administered 2020-01-04: 6 via TOPICAL

## 2020-01-03 MED ORDER — ENSURE ENLIVE PO LIQD
237.0000 mL | Freq: Two times a day (BID) | ORAL | Status: DC
  Administered 2020-01-03 – 2020-01-07 (×5): 237 mL via ORAL

## 2020-01-03 MED ORDER — VANCOMYCIN HCL IN DEXTROSE 1-5 GM/200ML-% IV SOLN
1000.0000 mg | INTRAVENOUS | Status: AC
  Administered 2020-01-04 (×2): 1000 mg via INTRAVENOUS
  Filled 2020-01-03: qty 200

## 2020-01-03 MED ORDER — CHLORHEXIDINE GLUCONATE CLOTH 2 % EX PADS
6.0000 | MEDICATED_PAD | Freq: Once | CUTANEOUS | Status: AC
  Administered 2020-01-03: 6 via TOPICAL

## 2020-01-03 NOTE — H&P (View-Only) (Signed)
Overall stable.  Continues to slowly improve but remains very limited.  Patient with 3/5 strength in her left hand.  2/5 strength in her right hand.  Still with marked biceps and triceps weakness bilaterally.  Still with 2/5 strength in her lower extremities.  Patient with severe myelopathy's status post incomplete spinal cord injury secondary to spondylitic cervical stenosis.  Plan for level anterior cervical decompression and fusion tomorrow.  Risks and benefits once again explained.  Patient wishes to proceed. 

## 2020-01-03 NOTE — Progress Notes (Signed)
Inpatient Rehabilitation-Admissions Coordinator   [facility] continues to follow for medical readiness. It appears pt will go to the OR on 3/16 for cervical surgery. Will follow up after surgery for possible admit to CIR.   Cheri Rous, OTR/L  Rehab Admissions Coordinator  [phone] 01/03/2020 9:47 AM

## 2020-01-03 NOTE — Progress Notes (Signed)
Overall stable.  Continues to slowly improve but remains very limited.  Patient with 3/5 strength in her left hand.  2/5 strength in her right hand.  Still with marked biceps and triceps weakness bilaterally.  Still with 2/5 strength in her lower extremities.  Patient with severe myelopathy's status post incomplete spinal cord injury secondary to spondylitic cervical stenosis.  Plan for level anterior cervical decompression and fusion tomorrow.  Risks and benefits once again explained.  Patient wishes to proceed.

## 2020-01-03 NOTE — Progress Notes (Signed)
Nutrition Follow-up  RD working remotely.  DOCUMENTATION CODES:   Non-severe (moderate) malnutrition in context of social or environmental circumstances, Underweight  INTERVENTION:   -Downgrade diet to dysphagia 3 (advanced mechanical soft) for ease of intake (pt with multiple missing teeth) -D/c Boost Breeze po TID, each supplement provides 250 kcal and 9 grams of protein -Ensure Enlive po BID, each supplement provides 350 kcal and 20 grams of protein -Continue MVI with minerals daily  NUTRITION DIAGNOSIS:   Moderate Malnutrition related to social / environmental circumstances as evidenced by mild fat depletion, moderate fat depletion, mild muscle depletion, moderate muscle depletion, energy intake < or equal to 75% for > or equal to 1 month.  Ongoing  GOAL:   Patient will meet greater than or equal to 90% of their needs  Progressing  MONITOR:   PO intake, Supplement acceptance, Diet advancement, Labs, Weight trends, Skin, I & O's  REASON FOR ASSESSMENT:   Consult Assessment of nutrition requirement/status  ASSESSMENT:   54 year old woman with no significant past medical history presenting with syncope.  She has been having generalized fatigue.  She has had poor p.o. intake and has not eaten in the last day.  She stood up this evening and passed out.  Family heard her fall.  She did hit her head.  She was told she was not unconscious for a long period of time.  She was noted by EMS to be hypotensive and she was given a small fluid bolus.  Presently she complains of feeling pain all over her body.  She admits to using marijuana today.  She does not take any other medications other than a multivitamin.  She lives with her husband.  She denies that he takes any medications either.  She does not think that she could've inadvertently taken any antihypertensives.  She did recently have tooth pain and had taken some Tylenol with codeine several days ago.  None since.  3/8- s/p EGD-  revealed diffuse mildly erythematous mucosa without bleeding founding int gastric body, gastric antrum, pre-pyloric region, and pylorus; two non-bleeding superficial ulcers found in pre-pyloric region of stomach- hemostasisclip placed 3/11- s/p OPERATIONS: 1. Multiple extraction of tooth numbers2-8, 12-14,18,and 20 through 28. 2.4Quadrants of alveoloplasty 3.Bilateral mandibular lingual tori reductions 3/13- advanced to full liquid diet 3/14- advanced to soft diet  Reviewed I/O's: -1 L x 24 hours and -5.6 L since 12/20/19  UOP: 1.2 L x 24 hours  Per neurosurgery notes, plan for C3-4, C4-5, C5-6 and C6-7 anterior cervical discectomy and interbody fusion utilizing interbody peek cages, with harvested autograft, and anterior plate instrumentation on 01/04/20.   Pt remains very positive and motivated. Per RN notes, she is very excited that she is now able to feed hersellf using assistive devices.   Pt now on a soft diet. Noted meal completion 60-100%. A GI soft diet is a low, fiber, low residue diet designed for patients undergoing GI surgery and/or with GI diseases/exacerbations. Pt would benefit more from a dysphagia 3 (advanced mechanical soft) diet, which provides foods easier to chew and swallow, due to recent dental extractions.  Labs reviewed: CBGS: 96.   Diet Order:   Diet Order            Diet NPO time specified  Diet effective midnight        DIET SOFT Room service appropriate? Yes; Fluid consistency: Thin  Diet effective now              EDUCATION NEEDS:  Education needs have been addressed  Skin:  Skin Assessment: Reviewed RN Assessment  Last BM:  01/01/20  Height:   Ht Readings from Last 1 Encounters:  12/19/19 5\' 9"  (1.753 m)    Weight:   Wt Readings from Last 1 Encounters:  01/03/20 55 kg    Ideal Body Weight:  57 kg(adjusted for quadriplegia)  BMI:  Body mass index is 17.91 kg/m.  Estimated Nutritional Needs:   Kcal:  2426-8341  Protein:   100-115 grams  Fluid:  > 1.7 L    Loistine Chance, RD, LDN, [facility] Registered Dietitian II Certified Diabetes Care and Education Specialist Please refer to [facility] for RD and/or RD on-call/weekend/after hours pager

## 2020-01-03 NOTE — Progress Notes (Signed)
ANTICOAGULATION CONSULT NOTE  Pharmacy Consult for heparin Indication: pulmonary embolus  Allergies  Allergen Reactions  . Ampicillin Diarrhea    Patient Measurements: Height: 5\' 9"  (175.3 cm) Weight: 121 lb 4.1 oz (55 kg) IBW/kg (Calculated) : 66.2 Heparin Dosing Weight: 56.5 kg  Vital Signs: Temp: 98.7 F (37.1 C) (03/15 1134) Temp Source: Oral (03/15 1134) BP: 95/60 (03/15 1226) Pulse Rate: 84 (03/15 1226)  Labs: Recent Labs    01/01/20 0311 01/01/20 0311 01/02/20 0524 01/02/20 1428 01/02/20 2138 01/03/20 0538 01/03/20 1438  HGB 9.9*   < > 10.6*  --   --  10.8*  --   HCT 31.8*  --  33.6*  --   --  34.4*  --   PLT 349  --  360  --   --  355  --   HEPARINUNFRC 0.41   < > 0.19*   < > 0.63 0.11* 0.24*   < > = values in this interval not displayed.    Estimated Creatinine Clearance: 71.4 mL/min (by C-G formula based on SCr of 0.77 mg/dL).   Assessment: 54 yo F on IV heparin for PE.  Now s/p dental extractions 3/11 and heparin resumed.   Heparin level is slightly sub-therapeutic; however, it is only a 4-hr and not a 6-hr heparin level.  Noted plan to stop IV heparin at midnight for surgery in AM, will continue current rate.   Goal of Therapy:  Heparin level 0.3-0.7 units/ml Monitor platelets by anticoagulation protocol: Yes   Plan:  Continue IV heparin at 950 units/hr, stop at midnight per MD F/U with resuming AC post NS procedure on 3/16  Anna Scheck D. 11-27-1981, PharmD, BCPS, BCCCP 01/03/2020, 3:39 PM

## 2020-01-03 NOTE — Progress Notes (Signed)
Occupational Therapy Treatment Patient Details Name: Anna Campos MRN: [id number] DOB: 1966/08/21 Today's Date: 01/03/2020    History of present illness 54 year old woman with no significant past medical history presenting with syncope and s/p fall. Pt found to have incomplete spincal cord injury, bacteremia, and bilat PEs. N/S following, plan for surgery in 2 weeks.   OT comments  All goals reviewed and updated. Cotx with PT. Pt's performance impacted this date by orthostatic hypotension (see BP readings below) with pt symptomatic. Continued education with pt on log roll technique. Pt able to initiate and utilize BUEs to reach and grasp onto bed rail while rolling and pushing from sidelying. Pt tolerated sitting  EOB ~10 min with variable supervision to min assist. Noted occasional retro lean with pt requiring verbal and tactile cues to maintain neutral alignment. Pt tolerated standing ~5 min in sara stedy. Pt able to maintain light grip on stedy with cues to tighten grip during transfers. 3+ instances of LOB while standing with pt requiring min assist x 2 to self-correct. Pt reporting increased dizziness in standing with BP dropping. Pt assisted back to bed and positioned for comfort. OT will continue to follow acutely. Continue to recommend CIR for additional rehab prior to discharge home.   BP semi-reclined: 96/34mmHg. BP sitting: 102/48mmHg. BP standing: 77/60mmHg.    Follow Up Recommendations  CIR    Equipment Recommendations  Other (comment)(TBD at next venue of care)    Recommendations for Other Services      Precautions / Restrictions Precautions Precautions: Fall;Other (comment) Precaution Comments: central cord. Monitor BP - orthostatic hypotension Restrictions Weight Bearing Restrictions: No       Mobility Bed Mobility Overal bed mobility: Needs Assistance Bed Mobility: Rolling;Sidelying to Sit;Sit to Sidelying Rolling: Min assist Sidelying to sit: Mod assist;+2 for  physical assistance     Sit to sidelying: Mod assist;+2 for physical assistance General bed mobility comments: Continued education with pt on log roll technique. Pt able to utilize BUEs more to reach and grasp onto bed rail while rolling. Assist for trunk and BLEs.   Transfers Overall transfer level: Needs assistance Equipment used: (sara stedy) Transfers: Sit to/from Stand Sit to Stand: +2 physical assistance;Mod assist         General transfer comment: Pt tolerated standing ~5 min in sara stedy. Pt able to maintain light grip on stedy with cues to tighten grip during transfers. 3+ instances of LOB while standing with pt requiring min assist x 2 to self-correct. Pt's activity tolerance limited due to dizziness and low BP.      Balance Overall balance assessment: Needs assistance Sitting-balance support: Feet supported Sitting balance-Leahy Scale: Fair Sitting balance - Comments: Pt tolerated sitting EOB ~10 min with variable supervision to min assist. Noted occasional retro lean with pt requiring verbal and tactile cues to maintain neutral alignment.  Postural control: Posterior lean Standing balance support: During functional activity;Bilateral upper extremity supported Standing balance-Leahy Scale: Poor                             ADL either performed or assessed with clinical judgement   ADL Overall ADL's : Needs assistance/impaired Eating/Feeding: Sitting;Supervision/ safety Eating/Feeding Details (indicate cue type and reason): Supervision to min assist to hold onto cup. Noted 0 drops throughout. Pt primarily holds onto drink with left hand with right as added support. Pt reports that left hand is stronger than right at this time.  Grooming: Wash/dry hands;Wash/dry  face;Sitting;Bed level;Supervision/safety;Set up Grooming Details (indicate cue type and reason): Initially attempted in sitting, however due to fatigue and dizziness completed in bed. Pt able to bring  hand to face.                              Functional mobility during ADLs: Moderate assistance;+2 for physical assistance;+2 for safety/equipment(with use of sara stedy) General ADL Comments: Pt tolerated sitting EOB ~10 min with variable supervision to min assist. Noted occasional retro lean with pt requiring verbal and tactile cues to maintain neutral alignment. Pt stood ~5 min in sara stedy with variable min guard to min assist x 2.       Vision       Perception     Praxis      Cognition Arousal/Alertness: Awake/alert Behavior During Therapy: WFL for tasks assessed/performed Overall Cognitive Status: Within Functional Limits for tasks assessed                                 General Comments: Pt pleasant and motivated to participate in therapy.         Exercises     Shoulder Instructions       General Comments Orthostatic hypotension impacting pt's performance this date.     Pertinent Vitals/ Pain       Pain Assessment: No/denies pain  Home Living                                          Prior Functioning/Environment              Frequency           Progress Toward Goals  OT Goals(current goals can now be found in the care plan section)  Progress towards OT goals: Progressing toward goals(all goals reviewed and updated)  Acute Rehab OT Goals Time For Goal Achievement: 01/17/20 Potential to Achieve Goals: Good ADL Goals Pt Will Perform Eating: with supervision;sitting;with adaptive utensils  Plan Discharge plan remains appropriate    Co-evaluation    PT/OT/SLP Co-Evaluation/Treatment: Yes Reason for Co-Treatment: Complexity of the patient's impairments (multi-system involvement);For patient/therapist safety;To address functional/ADL transfers   OT goals addressed during session: ADL's and self-care;Strengthening/ROM      AM-PAC OT "6 Clicks" Daily Activity     Outcome Measure   Help from  another person eating meals?: A Little Help from another person taking care of personal grooming?: A Lot Help from another person toileting, which includes using toliet, bedpan, or urinal?: A Lot Help from another person bathing (including washing, rinsing, drying)?: A Lot Help from another person to put on and taking off regular upper body clothing?: A Lot Help from another person to put on and taking off regular lower body clothing?: Total 6 Click Score: 12    End of Session Equipment Utilized During Treatment: Gait belt;Other (comment)(sara stedy)  OT Visit Diagnosis: Unsteadiness on feet (R26.81);Muscle weakness (generalized) (M62.81)   Activity Tolerance Other (comment)(Limited by dizziness and low BP)   Patient Left in bed;with call bell/phone within reach;with family/visitor present   Nurse Communication Mobility status        Time: 0881-1031 OT Time Calculation (min): 31 min  Charges: OT General Charges $OT Visit: 1 Visit OT Treatments $Therapeutic Activity: 8-22 mins  Peterson Ao OTR/L [phone]   Peterson Ao 01/03/2020, 3:39 PM

## 2020-01-03 NOTE — Progress Notes (Signed)
Physical Therapy Treatment Patient Details Name: Anna Campos MRN: [id number] DOB: Mar 01, 1966 Today's Date: 01/03/2020    History of Present Illness 54 year old woman with no significant past medical history presenting with syncope and s/p fall. Pt found to have incomplete spincal cord injury, bacteremia, and bilat PEs. N/S following, plan for surgery in 2 weeks.    PT Comments    Patient continues to make progress toward PT goals and is eager to participate in therapy. Pt with improving strength particularly on L side. Pt able to stand with mod A +2 utilizing Stedy standing frame for balance/safety. OOB mobility limited by orthostatic BP and pt requested to return to bed end of session due to dizziness. Plan is for back to OR tomorrow. PT will continue to follow acutely and progress as tolerated with continued recommendation for CIR level therapies.     Follow Up Recommendations  CIR     Equipment Recommendations  Wheelchair (measurements PT);Wheelchair cushion (measurements PT)    Recommendations for Other Services Rehab consult     Precautions / Restrictions Precautions Precautions: Fall;Other (comment) Precaution Comments: central cord. Monitor BP - orthostatic hypotension Restrictions Weight Bearing Restrictions: No    Mobility  Bed Mobility Overal bed mobility: Needs Assistance Bed Mobility: Rolling;Sidelying to Sit;Sit to Sidelying Rolling: Min assist Sidelying to sit: Mod assist;+2 for physical assistance     Sit to sidelying: Mod assist;+2 for physical assistance General bed mobility comments: Continued education with pt on log roll technique. Pt able to utilize BUEs more to reach and grasp onto bed rail while rolling. Assist for trunk and BLEs.   Transfers Overall transfer level: Needs assistance Equipment used: (sara stedy) Transfers: Sit to/from Stand Sit to Stand: +2 physical assistance;Mod assist         General transfer comment: Pt tolerated standing  ~5 min utilizing Stedy standing frame; verbal and tactile cues for quad activation and increased hip extension in standing; Pt able to maintain light grip on stedy with cues to tighten grip during transfers. 3+ instances of LOB while standing with pt requiring min assist x 2 to self-correct. Pt's activity tolerance limited due to dizziness and low BP.    Ambulation/Gait                 Stairs             Wheelchair Mobility    Modified Rankin (Stroke Patients Only)       Balance Overall balance assessment: Needs assistance Sitting-balance support: Feet supported Sitting balance-Leahy Scale: Fair Sitting balance - Comments: Pt tolerated sitting EOB ~10 min with variable supervision to min assist. Noted occasional retro lean with pt requiring verbal and tactile cues to maintain neutral alignment.  Postural control: Posterior lean Standing balance support: During functional activity;Bilateral upper extremity supported Standing balance-Leahy Scale: Poor Standing balance comment: worked on weight shifting and bilat knee flexion in standing; pt with improving control on L side with continued R knee buckle vs hyperextension                            Cognition Arousal/Alertness: Awake/alert Behavior During Therapy: WFL for tasks assessed/performed Overall Cognitive Status: Within Functional Limits for tasks assessed                                 General Comments: Pt pleasant and motivated to participate in therapy.  Exercises      General Comments General comments (skin integrity, edema, etc.): BP in bed 93/64, in sitting 102/64, and while seated on Stedy just after standing 77/50; pt with c/o dizziness in standing       Pertinent Vitals/Pain Pain Assessment: No/denies pain    Home Living                      Prior Function            PT Goals (current goals can now be found in the care plan section) Progress towards PT  goals: Progressing toward goals    Frequency    Min 4X/week      PT Plan Current plan remains appropriate    Co-evaluation PT/OT/SLP Co-Evaluation/Treatment: Yes Reason for Co-Treatment: Complexity of the patient's impairments (multi-system involvement);To address functional/ADL transfers;For patient/therapist safety PT goals addressed during session: Mobility/safety with mobility OT goals addressed during session: ADL's and self-care;Strengthening/ROM      AM-PAC PT "6 Clicks" Mobility   Outcome Measure  Help needed turning from your back to your side while in a flat bed without using bedrails?: A Little Help needed moving from lying on your back to sitting on the side of a flat bed without using bedrails?: A Little Help needed moving to and from a bed to a chair (including a wheelchair)?: A Lot Help needed standing up from a chair using your arms (e.g., wheelchair or bedside chair)?: A Lot Help needed to walk in hospital room?: Total Help needed climbing 3-5 steps with a railing? : Total 6 Click Score: 12    End of Session Equipment Utilized During Treatment: Gait belt Activity Tolerance: Other (comment)(limited by orthostatic BP) Patient left: in bed;with call bell/phone within reach;with family/visitor present Nurse Communication: Mobility status PT Visit Diagnosis: Unsteadiness on feet (R26.81);Other abnormalities of gait and mobility (R26.89);Repeated falls (R29.6);Muscle weakness (generalized) (M62.81);History of falling (Z91.81);Ataxic gait (R26.0);Difficulty in walking, not elsewhere classified (R26.2)     Time: 6144-3154 PT Time Calculation (min) (ACUTE ONLY): 34 min  Charges:  $Gait Training: 8-22 mins                     Earney Navy, PTA Acute Rehabilitation Services Pager: [phone] Office: [phone]     Darliss Cheney 01/03/2020, 3:51 PM

## 2020-01-03 NOTE — Progress Notes (Signed)
PROGRESS NOTE    Anna Campos  JOA:[id number] DOB: 1965/11/20 DOA: 12/18/2019 PCP: London Pepper, MD     Brief Narrative:  Anna Campos is a 54 year old female with no significant past medical history presenting with syncope.  She has been having generalized fatigue.  She has had poor p.o. intake and has not eaten in the last day.  She stood up this evening and passed out.  Family heard her fall.  She did hit her head.  She was told she was not unconscious for a long period of time.  She was noted by EMS to be hypotensive and she was given a small fluid bolus.  She was admitted to ICU due to syncope, hypovolemic shock.  Due to EKG changes, cardiology was consulted.  Patient was weaned off Levophed and transferred to [facility] on 3/1.  She was found to have Streptococcus bacteremia.  Infectious disease consulted and recommended 2 weeks of ceftriaxone.  Due to bacteremia, dentistry was also consulted.  Patient developed anemia during her hospitalization, GI consulted and patient underwent EGD on 3/8.  Patient underwent dental extraction on 3/11.  New events last 24 hours / Subjective: Sitting in bed and eating breakfast without any physical complaints.  Assessment & Plan:   Active Problems:   Syncope   Quadriplegia (HCC)   Hypotension   Bacteremia   Poor dentition   Spinal stenosis of cervical region   Malnutrition of moderate degree   Hypovolemic shock and syncope -Admitted to ICU and now weaned off of Levophed.  Cortisol 23.5 -Appreciate cardiology; EKG shows nonspecific findings and pretest probability for CAD is low.  Troponin negative.  Echocardiogram with normal LVEF, normal RV size and function without valvular abnormalities.  No sign of right heart strain. No further cardiac work-up recommended -Stable  Strep constellatus bacteremia -ID consulted -Repeat blood cultures 3/1 negative -Completed rocephin for 2 weeks 12/19/2019-01/01/2020  Dental caries -CT  maxillofacial on 12/18/2019 without mention of any dental caries or abscess -Orthopantogram on 12/24/2019 showed numerous dental caries.  Mild lucency around many of the remaining lower teeth. -S/p dental extraction 3/11 with Dr. Lawana Chambers. Follow-up with primary dentist of her choice for fabrication of upper and lower complete dentures after adequate healing. Follow-up with dental medicine for evaluation of healing and suture removal once discharged from this admission.   PE -CTA chest: Small nonocclusive pulmonary emboli bilaterally involving segmental and subsegmental sized vessels -DVT US negative -IV heparin.  Plan to transition to [place] prior to discharge once surgical intervention is complete  Bilateral extremity weakness with incomplete spinal cord injury C3-C6 -Distal lower extremity weaker compared to proximal -Neurosurgery following, tentative surgical multilevel cervical decompressive procedure planned 3/16 -CIR post op  Acute normocytic anemia/GI bleed/acute blood loss anemia -Hemoglobin dropped to 6.8  (see on admission hemoglobin approximately 9-10) -Denies hematuria or hematochezia, melena. FOBT positive -Anemia panel showed adequate iron and storage -Gastroenterology consulted and appreciated -Transfused 2u PRBC on 3/6 -Status post EGD on 12/27/2019 showing mild erythematous mucosa in the entire gastric cavity.  2 superficial prepyloric ulcers 1 with clean base the other with pigmentation.  1 Endo Clip was deployed to close the mucosal defect.  Erythematous duodenal bulb.  PPI BID recommended for 2 months.  Okay to restart IV heparin. H. pylori serology positive, start triple therapy clarithromycin/amoxcillin/PPI for 14 days starting 3/11 -Hemoglobin remains stable   DVT prophylaxis: IV heparin Code Status: Full code Family Communication: No family at bedside Disposition Plan:  . Patient is  from home prior to admission. . Currently in-hospital treatment needed due to  neurosurgery planned 3/16 . Suspect patient will discharge CIR postoperatively   Consultants:   PCCM admission  Cardiology  ID   Neurosurgery   Dentistry  Procedures:   EGD 3/8  Dental extraction 3/11   Antimicrobials:  Anti-infectives (From admission, onward)   Start     Dose/Rate Route Frequency Ordered Stop   12/30/19 1215  amoxicillin (AMOXIL) capsule 1,000 mg     1,000 mg Oral Every 12 hours 12/30/19 1213 01/13/20 0959   12/30/19 1215  clarithromycin (BIAXIN) tablet 500 mg     500 mg Oral Every 12 hours 12/30/19 1213 01/13/20 0959   12/30/19 0800  cefTRIAXone (ROCEPHIN) 2 g in sodium chloride 0.9 % 100 mL IVPB  Status:  Discontinued     2 g 200 mL/hr over 30 Minutes Intravenous To Surgery 12/30/19 0748 12/30/19 1056   12/21/19 1000  cefTRIAXone (ROCEPHIN) 2 g in sodium chloride 0.9 % 100 mL IVPB     2 g 200 mL/hr over 30 Minutes Intravenous Every 24 hours 12/21/19 0948 01/02/20 2056   12/20/19 2130  vancomycin (VANCOREADY) IVPB 750 mg/150 mL  Status:  Discontinued     750 mg 150 mL/hr over 60 Minutes Intravenous Every 12 hours 12/20/19 1825 12/21/19 0948   12/20/19 1530  ceFAZolin (ANCEF) IVPB 2g/100 mL premix  Status:  Discontinued     2 g 200 mL/hr over 30 Minutes Intravenous Every 8 hours 12/20/19 1526 12/20/19 1814   12/20/19 1000  vancomycin (VANCOREADY) IVPB 750 mg/150 mL  Status:  Discontinued     750 mg 150 mL/hr over 60 Minutes Intravenous Every 12 hours 12/20/19 0054 12/20/19 1019   12/20/19 0130  vancomycin (VANCOREADY) IVPB 1250 mg/250 mL     1,250 mg 166.7 mL/hr over 90 Minutes Intravenous  Once 12/20/19 0054 12/20/19 0356       Objective: Vitals:   01/02/20 0913 01/02/20 1521 01/02/20 2017 01/03/20 0420  BP: (!) 92/49 91/60 100/62 98/68  Pulse: 70 74 87 69  Resp: 18 19 18 16   Temp: 99.1 F (37.3 C) (!) 100.8 F (38.2 C) 98.7 F (37.1 C) 98.2 F (36.8 C)  TempSrc: Oral Oral Oral Oral  SpO2: 100% 99% 99% 100%  Weight:    55 kg  Height:         Intake/Output Summary (Last 24 hours) at 01/03/2020 1005 Last data filed at 01/03/2020 0853 Gross per 24 hour  Intake 360 ml  Output 1150 ml  Net -790 ml   Filed Weights   01/01/20 0515 01/02/20 0455 01/03/20 0420  Weight: 55.4 kg 55.1 kg 55 kg    Examination: General exam: Appears calm and comfortable  Respiratory system: Clear to auscultation. Respiratory effort normal. Cardiovascular system: S1 & S2 heard, RRR. No pedal edema. Gastrointestinal system: Abdomen is nondistended, soft and nontender. Normal bowel sounds heard. Central nervous system: Alert and oriented. Speech clear  Extremities: Symmetric in appearance bilaterally  Skin: No rashes, lesions or ulcers on exposed skin  Psychiatry: Judgement and insight appear stable. Mood & affect appropriate.     Data Reviewed: I have personally reviewed following labs and imaging studies  CBC: Recent Labs  Lab 12/30/19 0546 12/31/19 0350 01/01/20 0311 01/02/20 0524 01/03/20 0538  WBC 7.7 16.5* 9.8 7.4 6.9  HGB 10.6* 10.5* 9.9* 10.6* 10.8*  HCT 33.3* 33.6* 31.8* 33.6* 34.4*  MCV 96.2 96.6 97.5 96.8 96.9  PLT 344 368 349 360  355   Basic Metabolic Panel: Recent Labs  Lab 12/29/19 0418  NA 143  K 3.8  CL 107  CO2 26  GLUCOSE 99  BUN 11  CREATININE 0.77  CALCIUM 9.1   GFR: Estimated Creatinine Clearance: 71.4 mL/min (by C-G formula based on SCr of 0.77 mg/dL). Liver Function Tests: No results for input(s): AST, ALT, ALKPHOS, BILITOT, PROT, ALBUMIN in the last 168 hours. No results for input(s): LIPASE, AMYLASE in the last 168 hours. No results for input(s): AMMONIA in the last 168 hours. Coagulation Profile: No results for input(s): INR, PROTIME in the last 168 hours. Cardiac Enzymes: No results for input(s): CKTOTAL, CKMB, CKMBINDEX, TROPONINI in the last 168 hours. BNP (last 3 results) No results for input(s): PROBNP in the last 8760 hours. HbA1C: No results for input(s): HGBA1C in the last 72  hours. CBG: Recent Labs  Lab 01/01/20 0602  GLUCAP 96   Lipid Profile: No results for input(s): CHOL, HDL, LDLCALC, TRIG, CHOLHDL, LDLDIRECT in the last 72 hours. Thyroid Function Tests: No results for input(s): TSH, T4TOTAL, FREET4, T3FREE, THYROIDAB in the last 72 hours. Anemia Panel: No results for input(s): VITAMINB12, FOLATE, FERRITIN, TIBC, IRON, RETICCTPCT in the last 72 hours. Sepsis Labs: No results for input(s): PROCALCITON, LATICACIDVEN in the last 168 hours.  No results found for this or any previous visit (from the past 240 hour(s)).    Radiology Studies: No results found.    Scheduled Meds: . amoxicillin  1,000 mg Oral Q12H   And  . clarithromycin  500 mg Oral Q12H   And  . pantoprazole  40 mg Oral BID AC  . chlorhexidine  15 mL Mouth/Throat BID  . feeding supplement  1 Container Oral TID BM  . loratadine  10 mg Oral Daily  . multivitamin with minerals  1 tablet Oral Daily   Continuous Infusions: . sodium chloride Stopped (12/31/19 2240)  . heparin 950 Units/hr (01/03/20 0956)  . sodium chloride irrigation       LOS: 15 days      Time spent: 20 minutes   Noralee Stain, DO [facility] 01/03/2020, 10:05 AM   Available via Epic secure chat 7am-7pm After these hours, please refer to coverage provider listed on [email or web address]

## 2020-01-04 ENCOUNTER — Inpatient Hospital Stay (HOSPITAL_COMMUNITY): Payer: Commercial Managed Care - PPO

## 2020-01-04 ENCOUNTER — Encounter (HOSPITAL_COMMUNITY): Payer: Self-pay | Admitting: Pulmonary Disease

## 2020-01-04 ENCOUNTER — Inpatient Hospital Stay (HOSPITAL_COMMUNITY): Payer: Commercial Managed Care - PPO | Admitting: Certified Registered Nurse Anesthetist

## 2020-01-04 ENCOUNTER — Inpatient Hospital Stay (HOSPITAL_COMMUNITY)
Admission: EM | Disposition: A | Discharge status: Rehabilitation Facility | Payer: Self-pay | Source: Home / Self Care | Attending: Internal Medicine

## 2020-01-04 HISTORY — PX: ANTERIOR CERVICAL DECOMPRESSION/DISCECTOMY FUSION 4 LEVELS: SHX5556

## 2020-01-04 LAB — CBC
HCT: 37.8 % (ref 36.0–46.0)
Hemoglobin: 12 g/dL (ref 12.0–15.0)
MCH: 30.2 pg (ref 26.0–34.0)
MCHC: 31.7 g/dL (ref 30.0–36.0)
MCV: 95 fL (ref 80.0–100.0)
Platelets: 423 10*3/uL — ABNORMAL HIGH (ref 150–400)
RBC: 3.98 MIL/uL (ref 3.87–5.11)
RDW: 14.1 % (ref 11.5–15.5)
WBC: 10.7 10*3/uL — ABNORMAL HIGH (ref 4.0–10.5)
nRBC: 0 % (ref 0.0–0.2)

## 2020-01-04 SURGERY — ANTERIOR CERVICAL DECOMPRESSION/DISCECTOMY FUSION 4 LEVELS
Anesthesia: General

## 2020-01-04 MED ORDER — MEPERIDINE HCL 25 MG/ML IJ SOLN: 6.2500 mg | INTRAMUSCULAR | Status: DC | PRN

## 2020-01-04 MED ORDER — SODIUM CHLORIDE 0.9% FLUSH
3.0000 mL | Freq: Two times a day (BID) | INTRAVENOUS | Status: DC
  Administered 2020-01-04 – 2020-01-11 (×13): 3 mL via INTRAVENOUS

## 2020-01-04 MED ORDER — ONDANSETRON HCL 4 MG/2ML IJ SOLN: 4.0000 mg | Freq: Four times a day (QID) | INTRAMUSCULAR | Status: DC | PRN

## 2020-01-04 MED ORDER — OXYCODONE HCL 5 MG PO TABS: 5.0000 mg | ORAL_TABLET | Freq: Once | ORAL | Status: DC | PRN

## 2020-01-04 MED ORDER — MIDAZOLAM HCL 2 MG/2ML IJ SOLN
INTRAMUSCULAR | Status: AC
  Filled 2020-01-04: qty 2

## 2020-01-04 MED ORDER — ROCURONIUM BROMIDE 50 MG/5ML IV SOSY
PREFILLED_SYRINGE | INTRAVENOUS | Status: DC | PRN
  Administered 2020-01-04: 50 mg via INTRAVENOUS
  Administered 2020-01-04 (×2): 20 mg via INTRAVENOUS

## 2020-01-04 MED ORDER — FENTANYL CITRATE (PF) 100 MCG/2ML IJ SOLN
25.0000 ug | INTRAMUSCULAR | Status: DC | PRN
  Administered 2020-01-04: 25 ug via INTRAVENOUS

## 2020-01-04 MED ORDER — SUGAMMADEX SODIUM 200 MG/2ML IV SOLN
INTRAVENOUS | Status: DC | PRN
  Administered 2020-01-04: 120 mg via INTRAVENOUS

## 2020-01-04 MED ORDER — OXYCODONE HCL 5 MG/5ML PO SOLN: 5.0000 mg | Freq: Once | ORAL | Status: DC | PRN

## 2020-01-04 MED ORDER — THROMBIN 20000 UNITS EX SOLR
CUTANEOUS | Status: DC | PRN
  Administered 2020-01-04: 20 mL via TOPICAL

## 2020-01-04 MED ORDER — FENTANYL CITRATE (PF) 250 MCG/5ML IJ SOLN
INTRAMUSCULAR | Status: AC
  Filled 2020-01-04: qty 5

## 2020-01-04 MED ORDER — ONDANSETRON HCL 4 MG/2ML IJ SOLN
INTRAMUSCULAR | Status: AC
  Filled 2020-01-04: qty 2

## 2020-01-04 MED ORDER — THROMBIN 5000 UNITS EX SOLR
OROMUCOSAL | Status: DC | PRN
  Administered 2020-01-04 (×2): 5 mL via TOPICAL

## 2020-01-04 MED ORDER — SODIUM CHLORIDE 0.9 % IV SOLN
INTRAVENOUS | Status: DC | PRN
  Administered 2020-01-04: 500 mL

## 2020-01-04 MED ORDER — ONDANSETRON HCL 4 MG/2ML IJ SOLN: 4.0000 mg | Freq: Once | INTRAMUSCULAR | Status: DC | PRN

## 2020-01-04 MED ORDER — MIDAZOLAM HCL 2 MG/2ML IJ SOLN
INTRAMUSCULAR | Status: DC | PRN
  Administered 2020-01-04: 2 mg via INTRAVENOUS

## 2020-01-04 MED ORDER — DEXAMETHASONE SODIUM PHOSPHATE 10 MG/ML IJ SOLN
INTRAMUSCULAR | Status: AC
  Filled 2020-01-04: qty 1

## 2020-01-04 MED ORDER — LACTATED RINGERS IV SOLN: INTRAVENOUS | Status: DC

## 2020-01-04 MED ORDER — 0.9 % SODIUM CHLORIDE (POUR BTL) OPTIME
TOPICAL | Status: DC | PRN
  Administered 2020-01-04: 1000 mL

## 2020-01-04 MED ORDER — SUCCINYLCHOLINE CHLORIDE 200 MG/10ML IV SOSY
PREFILLED_SYRINGE | INTRAVENOUS | Status: DC | PRN
  Administered 2020-01-04: 120 mg via INTRAVENOUS

## 2020-01-04 MED ORDER — SODIUM CHLORIDE 0.9 % IV SOLN: 250.0000 mL | INTRAVENOUS | Status: DC

## 2020-01-04 MED ORDER — THROMBIN 5000 UNITS EX SOLR
CUTANEOUS | Status: AC
  Filled 2020-01-04: qty 5000

## 2020-01-04 MED ORDER — THROMBIN 20000 UNITS EX SOLR
CUTANEOUS | Status: AC
  Filled 2020-01-04: qty 20000

## 2020-01-04 MED ORDER — SODIUM CHLORIDE 0.9% FLUSH: 3.0000 mL | INTRAVENOUS | Status: DC | PRN

## 2020-01-04 MED ORDER — LIDOCAINE 2% (20 MG/ML) 5 ML SYRINGE
INTRAMUSCULAR | Status: AC
  Filled 2020-01-04: qty 5

## 2020-01-04 MED ORDER — PROPOFOL 10 MG/ML IV BOLUS
INTRAVENOUS | Status: DC | PRN
  Administered 2020-01-04: 160 mg via INTRAVENOUS

## 2020-01-04 MED ORDER — PHENYLEPHRINE 40 MCG/ML (10ML) SYRINGE FOR IV PUSH (FOR BLOOD PRESSURE SUPPORT)
PREFILLED_SYRINGE | INTRAVENOUS | Status: DC | PRN
  Administered 2020-01-04 (×2): 80 ug via INTRAVENOUS

## 2020-01-04 MED ORDER — FENTANYL CITRATE (PF) 100 MCG/2ML IJ SOLN
INTRAMUSCULAR | Status: DC | PRN
  Administered 2020-01-04 (×4): 50 ug via INTRAVENOUS

## 2020-01-04 MED ORDER — DEXAMETHASONE SODIUM PHOSPHATE 10 MG/ML IJ SOLN
INTRAMUSCULAR | Status: DC | PRN
  Administered 2020-01-04: 10 mg via INTRAVENOUS

## 2020-01-04 MED ORDER — PHENYLEPHRINE HCL-NACL 10-0.9 MG/250ML-% IV SOLN
INTRAVENOUS | Status: DC | PRN
  Administered 2020-01-04: 20 ug/min via INTRAVENOUS

## 2020-01-04 MED ORDER — PHENOL 1.4 % MT LIQD: 1.0000 | OROMUCOSAL | Status: DC | PRN

## 2020-01-04 MED ORDER — CYCLOBENZAPRINE HCL 10 MG PO TABS
10.0000 mg | ORAL_TABLET | Freq: Three times a day (TID) | ORAL | Status: DC | PRN
  Administered 2020-01-04 – 2020-01-05 (×2): 10 mg via ORAL
  Filled 2020-01-04 (×2): qty 1

## 2020-01-04 MED ORDER — FENTANYL CITRATE (PF) 100 MCG/2ML IJ SOLN
INTRAMUSCULAR | Status: AC
  Filled 2020-01-04: qty 2

## 2020-01-04 MED ORDER — HYDROMORPHONE HCL 1 MG/ML IJ SOLN: 1.0000 mg | INTRAMUSCULAR | Status: DC | PRN

## 2020-01-04 MED ORDER — ONDANSETRON HCL 4 MG/2ML IJ SOLN
INTRAMUSCULAR | Status: DC | PRN
  Administered 2020-01-04: 4 mg via INTRAVENOUS

## 2020-01-04 MED ORDER — LIDOCAINE 2% (20 MG/ML) 5 ML SYRINGE
INTRAMUSCULAR | Status: DC | PRN
  Administered 2020-01-04: 100 mg via INTRAVENOUS

## 2020-01-04 MED ORDER — ROCURONIUM BROMIDE 10 MG/ML (PF) SYRINGE
PREFILLED_SYRINGE | INTRAVENOUS | Status: AC
  Filled 2020-01-04: qty 10

## 2020-01-04 MED ORDER — MENTHOL 3 MG MT LOZG
1.0000 | LOZENGE | OROMUCOSAL | Status: DC | PRN
  Administered 2020-01-06: 3 mg via ORAL
  Filled 2020-01-04: qty 9

## 2020-01-04 MED ORDER — ONDANSETRON HCL 4 MG PO TABS: 4.0000 mg | ORAL_TABLET | Freq: Four times a day (QID) | ORAL | Status: DC | PRN

## 2020-01-04 MED ORDER — HYDROCODONE-ACETAMINOPHEN 5-325 MG PO TABS
1.0000 | ORAL_TABLET | ORAL | Status: DC | PRN
  Administered 2020-01-07 – 2020-01-10 (×6): 1 via ORAL
  Filled 2020-01-04 (×7): qty 1

## 2020-01-04 MED ORDER — HYDROCODONE-ACETAMINOPHEN 10-325 MG PO TABS
2.0000 | ORAL_TABLET | ORAL | Status: DC | PRN
  Administered 2020-01-05 – 2020-01-11 (×12): 2 via ORAL
  Filled 2020-01-04 (×12): qty 2

## 2020-01-04 MED ORDER — SUCCINYLCHOLINE CHLORIDE 200 MG/10ML IV SOSY
PREFILLED_SYRINGE | INTRAVENOUS | Status: AC
  Filled 2020-01-04: qty 10

## 2020-01-04 MED ORDER — CEFAZOLIN SODIUM-DEXTROSE 1-4 GM/50ML-% IV SOLN
1.0000 g | Freq: Three times a day (TID) | INTRAVENOUS | Status: AC
  Administered 2020-01-04 – 2020-01-05 (×2): 1 g via INTRAVENOUS
  Filled 2020-01-04 (×2): qty 50

## 2020-01-04 SURGICAL SUPPLY — 62 items
ADH SKN CLS APL DERMABOND .7 (GAUZE/BANDAGES/DRESSINGS) ×1
APL SKNCLS STERI-STRIP NONHPOA (GAUZE/BANDAGES/DRESSINGS) ×1
BAG DECANTER FOR FLEXI CONT (MISCELLANEOUS) ×2 IMPLANT
BAND INSRT 18 STRL LF DISP RB (MISCELLANEOUS) ×2
BAND RUBBER #18 3X1/16 STRL (MISCELLANEOUS) ×4 IMPLANT
BENZOIN TINCTURE PRP APPL 2/3 (GAUZE/BANDAGES/DRESSINGS) ×2 IMPLANT
BIT DRILL 13 (BIT) ×1 IMPLANT
BUR MATCHSTICK NEURO 3.0 LAGG (BURR) ×2 IMPLANT
CAGE PEEK 6X14X11 (Cage) ×6 IMPLANT
CAGE PEEK 7X14X11 (Cage) ×2 IMPLANT
CANISTER SUCT 3000ML PPV (MISCELLANEOUS) ×2 IMPLANT
CARTRIDGE OIL MAESTRO DRILL (MISCELLANEOUS) ×1 IMPLANT
DERMABOND ADVANCED (GAUZE/BANDAGES/DRESSINGS) ×1
DERMABOND ADVANCED .7 DNX12 (GAUZE/BANDAGES/DRESSINGS) IMPLANT
DIFFUSER DRILL AIR PNEUMATIC (MISCELLANEOUS) ×2 IMPLANT
DRAPE C-ARM 42X72 X-RAY (DRAPES) ×4 IMPLANT
DRAPE LAPAROTOMY 100X72 PEDS (DRAPES) ×2 IMPLANT
DRAPE MICROSCOPE LEICA (MISCELLANEOUS) ×2 IMPLANT
DRSG OPSITE POSTOP 4X6 (GAUZE/BANDAGES/DRESSINGS) ×1 IMPLANT
DURAPREP 6ML APPLICATOR 50/CS (WOUND CARE) ×2 IMPLANT
ELECT COATED BLADE 2.86 ST (ELECTRODE) ×2 IMPLANT
ELECT REM PT RETURN 9FT ADLT (ELECTROSURGICAL) ×2
ELECTRODE REM PT RTRN 9FT ADLT (ELECTROSURGICAL) ×1 IMPLANT
GAUZE 4X4 16PLY RFD (DISPOSABLE) IMPLANT
GAUZE SPONGE 4X4 12PLY STRL (GAUZE/BANDAGES/DRESSINGS) ×2 IMPLANT
GLOVE BIO SURGEON STRL SZ 6.5 (GLOVE) ×2 IMPLANT
GLOVE BIOGEL PI IND STRL 6.5 (GLOVE) ×1 IMPLANT
GLOVE BIOGEL PI INDICATOR 6.5 (GLOVE) ×1
GLOVE ECLIPSE 9.0 STRL (GLOVE) ×2 IMPLANT
GLOVE EXAM NITRILE XL STR (GLOVE) IMPLANT
GOWN STRL REUS W/ TWL LRG LVL3 (GOWN DISPOSABLE) IMPLANT
GOWN STRL REUS W/ TWL XL LVL3 (GOWN DISPOSABLE) IMPLANT
GOWN STRL REUS W/TWL 2XL LVL3 (GOWN DISPOSABLE) IMPLANT
GOWN STRL REUS W/TWL LRG LVL3 (GOWN DISPOSABLE)
GOWN STRL REUS W/TWL XL LVL3 (GOWN DISPOSABLE)
HALTER HD/CHIN CERV TRACTION D (MISCELLANEOUS) ×2 IMPLANT
HEMOSTAT POWDER KIT SURGIFOAM (HEMOSTASIS) ×4 IMPLANT
KIT BASIN OR (CUSTOM PROCEDURE TRAY) ×2 IMPLANT
KIT TURNOVER KIT B (KITS) ×2 IMPLANT
NDL SPNL 20GX3.5 QUINCKE YW (NEEDLE) ×1 IMPLANT
NEEDLE SPNL 20GX3.5 QUINCKE YW (NEEDLE) ×2 IMPLANT
NS IRRIG 1000ML POUR BTL (IV SOLUTION) ×2 IMPLANT
OIL CARTRIDGE MAESTRO DRILL (MISCELLANEOUS) ×2
PACK LAMINECTOMY NEURO (CUSTOM PROCEDURE TRAY) ×2 IMPLANT
PAD ARMBOARD 7.5X6 YLW CONV (MISCELLANEOUS) ×6 IMPLANT
PLATE 4 77.5XLCK NS SPNE CVD (Plate) IMPLANT
PLATE 4 ATLANTIS TRANS (Plate) ×2 IMPLANT
SCREW ST FIX 4 ATL 3120213 (Screw) ×10 IMPLANT
SPACER SPNL 11X14X6XPEEK CVD (Cage) IMPLANT
SPACER SPNL 11X14X7XPEEK CVD (Cage) IMPLANT
SPCR SPNL 11X14X6XPEEK CVD (Cage) ×3 IMPLANT
SPCR SPNL 11X14X7XPEEK CVD (Cage) ×1 IMPLANT
SPONGE INTESTINAL PEANUT (DISPOSABLE) ×2 IMPLANT
SPONGE SURGIFOAM ABS GEL 100 (HEMOSTASIS) ×2 IMPLANT
STRIP CLOSURE SKIN 1/2X4 (GAUZE/BANDAGES/DRESSINGS) ×2 IMPLANT
SUT VIC AB 3-0 SH 8-18 (SUTURE) ×2 IMPLANT
SUT VIC AB 4-0 RB1 18 (SUTURE) ×2 IMPLANT
TAPE CLOTH 4X10 WHT NS (GAUZE/BANDAGES/DRESSINGS) ×2 IMPLANT
TOWEL GREEN STERILE (TOWEL DISPOSABLE) ×2 IMPLANT
TOWEL GREEN STERILE FF (TOWEL DISPOSABLE) ×2 IMPLANT
TRAP SPECIMEN MUCOUS 40CC (MISCELLANEOUS) ×2 IMPLANT
WATER STERILE IRR 1000ML POUR (IV SOLUTION) ×2 IMPLANT

## 2020-01-04 NOTE — Progress Notes (Signed)
Report called and given to [facility] in short stay.

## 2020-01-04 NOTE — Anesthesia Postprocedure Evaluation (Signed)
Anesthesia Post Note  Patient: Alanie Syler  Procedure(s) Performed: CERVICAL THREE- FOUR, CERVICAL FOUR-FIVE, CERVICAL FIVE- SIX, CERVCAL SIX- SEVEN ANTERIOR CERVICAL DECOMPRESSION/DISCECTOMY FUSION (N/A )     Patient location during evaluation: PACU Anesthesia Type: General Level of consciousness: awake Pain management: pain level controlled Vital Signs Assessment: post-procedure vital signs reviewed and stable Respiratory status: spontaneous breathing Cardiovascular status: stable Postop Assessment: no apparent nausea or vomiting Anesthetic complications: no    Last Vitals:  Vitals:   01/04/20 1730 01/04/20 1745  BP: 97/65 98/67  Pulse: 80 86  Resp: 19 15  Temp:    SpO2: 99% 100%    Last Pain:  Vitals:   01/04/20 1715  TempSrc:   PainSc: Asleep                 Mathieu Schloemer

## 2020-01-04 NOTE — Interval H&P Note (Signed)
History and Physical Interval Note:  01/04/2020 10:56 AM  Anna Campos  has presented today for surgery, with the diagnosis of Myelopathy - Incomplete spinal cord injury.  The various methods of treatment have been discussed with the patient and family. After consideration of risks, benefits and other options for treatment, the patient has consented to  Procedure(s): C3-4, C4-5, C5-6, C6-7 ANTERIOR CERVICAL DECOMPRESSION/DISCECTOMY FUSION (N/A) as a surgical intervention.  The patient's history has been reviewed, patient examined, no change in status, stable for surgery.  I have reviewed the patient's chart and labs.  Questions were answered to the patient's satisfaction.     Kathaleen Maser Rashawna Scoles

## 2020-01-04 NOTE — Brief Op Note (Signed)
01/04/2020  3:06 PM  PATIENT:  Anna Campos  54 y.o. female  PRE-OPERATIVE DIAGNOSIS:  Myelopathy - Incomplete spinal cord injury  POST-OPERATIVE DIAGNOSIS:  Myelopathy - Incomplete spinal cord injury  PROCEDURE:  Procedure(s) with comments: CERVICAL THREE- FOUR, CERVICAL FOUR-FIVE, CERVICAL FIVE- SIX, CERVCAL SIX- SEVEN ANTERIOR CERVICAL DECOMPRESSION/DISCECTOMY FUSION (N/A) - CERVICAL THREE- FOUR, CERVICAL FOUR-FIVE, CERVICAL FIVE- SIX, CERVCAL SIX- SEVEN ANTERIOR CERVICAL DECOMPRESSION/DISCECTOMY FUSION  SURGEON:  Surgeon(s) and Role:    * Julio Sicks, MD - Primary    * Bedelia Person, MD - Assisting  PHYSICIAN ASSISTANT:   ASSISTANTS:    ANESTHESIA:   general  EBL:  50 mL   BLOOD ADMINISTERED:none  DRAINS: none   LOCAL MEDICATIONS USED:  NONE  SPECIMEN:  No Specimen  DISPOSITION OF SPECIMEN:  N/A  COUNTS:  YES  TOURNIQUET:  * No tourniquets in log *  DICTATION: .Dragon Dictation  PLAN OF CARE: Admit to inpatient   PATIENT DISPOSITION:  PACU - hemodynamically stable.   Delay start of Pharmacological VTE agent (>24hrs) due to surgical blood loss or risk of bleeding: yes

## 2020-01-04 NOTE — Progress Notes (Signed)
PROGRESS NOTE    Rayssa Atha  WVP:[id number] DOB: 1966-01-27 DOA: 12/18/2019 PCP: Farris Has, MD     Brief Narrative:  Anna Campos is a 54 year old female with no significant past medical history presenting with syncope.  She has been having generalized fatigue.  She has had poor p.o. intake and has not eaten in the last day.  She stood up this evening and passed out.  Family heard her fall.  She did hit her head.  She was told she was not unconscious for a long period of time.  She was noted by EMS to be hypotensive and she was given a small fluid bolus.  She was admitted to ICU due to syncope, hypovolemic shock.  Due to EKG changes, cardiology was consulted.  Patient was weaned off Levophed and transferred to [facility] service on 3/1.  She was found to have Streptococcus bacteremia.  Infectious disease consulted and recommended 2 weeks of ceftriaxone.  Due to bacteremia, dentistry was also consulted.  Patient developed anemia during her hospitalization, GI consulted and patient underwent EGD on 3/8.  Patient underwent dental extraction on 3/11.  New events last 24 hours / Subjective: Awaiting surgery planned for today. No new complaints or issues overnight. No CP or SOB.   Assessment & Plan:   Active Problems:   Syncope   Quadriplegia (HCC)   Hypotension   Bacteremia   Poor dentition   Spinal stenosis of cervical region   Malnutrition of moderate degree   Hypovolemic shock and syncope -Admitted to ICU and now weaned off of Levophed.  Cortisol 23.5 -Appreciate cardiology; EKG shows nonspecific findings and pretest probability for CAD is low.  Troponin negative.  Echocardiogram with normal LVEF, normal RV size and function without valvular abnormalities.  No sign of right heart strain. No further cardiac work-up recommended -Stable  Strep constellatus bacteremia -ID consulted -Repeat blood cultures 3/1 negative -Completed rocephin for 2 weeks 12/19/2019-01/01/2020  Dental  caries -CT maxillofacial on 12/18/2019 without mention of any dental caries or abscess -Orthopantogram on 12/24/2019 showed numerous dental caries.  Mild lucency around many of the remaining lower teeth. -S/p dental extraction 3/11 with Dr. Robin Searing. Follow-up with primary dentist of her choice for fabrication of upper and lower complete dentures after adequate healing. Follow-up with dental medicine for evaluation of healing and suture removal once discharged from this admission.   PE -CTA chest: Small nonocclusive pulmonary emboli bilaterally involving segmental and subsegmental sized vessels -DVT US negative -IV heparin.  Plan to transition to DOAC prior to discharge once surgical intervention is complete  Bilateral extremity weakness with incomplete spinal cord injury C3-C6 -Distal lower extremity weaker compared to proximal -Neurosurgery following, tentative surgical multilevel cervical decompressive procedure planned 3/16 -CIR post op  Acute normocytic anemia/GI bleed/acute blood loss anemia -Hemoglobin dropped to 6.8  (see on admission hemoglobin approximately 9-10) -Denies hematuria or hematochezia, melena. FOBT positive -Anemia panel showed adequate iron and storage -Gastroenterology consulted and appreciated -Transfused 2u PRBC on 3/6 -Status post EGD on 12/27/2019 showing mild erythematous mucosa in the entire gastric cavity.  2 superficial prepyloric ulcers 1 with clean base the other with pigmentation.  1 Endo Clip was deployed to close the mucosal defect.  Erythematous duodenal bulb.  PPI BID recommended for 2 months.  Okay to restart IV heparin. H. pylori serology positive, start triple therapy clarithromycin/amoxcillin/PPI for 14 days starting 3/11 -Hemoglobin remains stable   DVT prophylaxis: IV heparin Code Status: Full code Family Communication: No family at bedside  Disposition Plan:  . Patient is from home prior to admission. . Currently in-hospital treatment needed due  to neurosurgery planned 3/16 . Suspect patient will discharge CIR postoperatively   Consultants:   PCCM admission  Cardiology  ID   Neurosurgery   Dentistry  Procedures:   EGD 3/8  Dental extraction 3/11   Antimicrobials:  Anti-infectives (From admission, onward)   Start     Dose/Rate Route Frequency Ordered Stop   01/04/20 1000  vancomycin (VANCOCIN) IVPB 1000 mg/200 mL premix     1,000 mg 200 mL/hr over 60 Minutes Intravenous To ShortStay Surgical 01/03/20 1030 01/05/20 1000   12/30/19 1215  [MAR Hold]  amoxicillin (AMOXIL) capsule 1,000 mg     (MAR Hold since Tue 01/04/2020 at 1000.Hold Reason: Transfer to a Procedural area.)   1,000 mg Oral Every 12 hours 12/30/19 1213 01/13/20 0959   12/30/19 1215  [MAR Hold]  clarithromycin (BIAXIN) tablet 500 mg     (MAR Hold since Tue 01/04/2020 at 1000.Hold Reason: Transfer to a Procedural area.)   500 mg Oral Every 12 hours 12/30/19 1213 01/13/20 0959   12/30/19 0800  cefTRIAXone (ROCEPHIN) 2 g in sodium chloride 0.9 % 100 mL IVPB  Status:  Discontinued     2 g 200 mL/hr over 30 Minutes Intravenous To Surgery 12/30/19 0748 12/30/19 1056   12/21/19 1000  cefTRIAXone (ROCEPHIN) 2 g in sodium chloride 0.9 % 100 mL IVPB     2 g 200 mL/hr over 30 Minutes Intravenous Every 24 hours 12/21/19 0948 01/02/20 2056   12/20/19 2130  vancomycin (VANCOREADY) IVPB 750 mg/150 mL  Status:  Discontinued     750 mg 150 mL/hr over 60 Minutes Intravenous Every 12 hours 12/20/19 1825 12/21/19 0948   12/20/19 1530  ceFAZolin (ANCEF) IVPB 2g/100 mL premix  Status:  Discontinued     2 g 200 mL/hr over 30 Minutes Intravenous Every 8 hours 12/20/19 1526 12/20/19 1814   12/20/19 1000  vancomycin (VANCOREADY) IVPB 750 mg/150 mL  Status:  Discontinued     750 mg 150 mL/hr over 60 Minutes Intravenous Every 12 hours 12/20/19 0054 12/20/19 1019   12/20/19 0130  vancomycin (VANCOREADY) IVPB 1250 mg/250 mL     1,250 mg 166.7 mL/hr over 90 Minutes Intravenous   Once 12/20/19 0054 12/20/19 0356       Objective: Vitals:   01/03/20 1226 01/03/20 1958 01/04/20 0150 01/04/20 0501  BP: 95/60 101/67  100/67  Pulse: 84 92  76  Resp:  18  17  Temp:  99.1 F (37.3 C)  98.6 F (37 C)  TempSrc:  Oral  Oral  SpO2:  97%  98%  Weight:   55.4 kg 54.8 kg  Height:        Intake/Output Summary (Last 24 hours) at 01/04/2020 1014 Last data filed at 01/04/2020 1009 Gross per 24 hour  Intake 1200 ml  Output 2825 ml  Net -1625 ml   Filed Weights   01/03/20 0420 01/04/20 0150 01/04/20 0501  Weight: 55 kg 55.4 kg 54.8 kg    Examination: General exam: Appears calm and comfortable  Respiratory system: Clear to auscultation. Respiratory effort normal. Cardiovascular system: S1 & S2 heard, RRR. No pedal edema. Gastrointestinal system: Abdomen is nondistended, soft and nontender. Normal bowel sounds heard. Central nervous system: Alert and oriented. Non focal exam. Speech clear  Extremities: Symmetric in appearance bilaterally  Skin: No rashes, lesions or ulcers on exposed skin  Psychiatry: Judgement and insight appear stable.  Mood & affect appropriate.    Data Reviewed: I have personally reviewed following labs and imaging studies  CBC: Recent Labs  Lab 12/31/19 0350 01/01/20 0311 01/02/20 0524 01/03/20 0538 01/04/20 0308  WBC 16.5* 9.8 7.4 6.9 10.7*  HGB 10.5* 9.9* 10.6* 10.8* 12.0  HCT 33.6* 31.8* 33.6* 34.4* 37.8  MCV 96.6 97.5 96.8 96.9 95.0  PLT 368 349 360 355 423*   Basic Metabolic Panel: Recent Labs  Lab 12/29/19 0418  NA 143  K 3.8  CL 107  CO2 26  GLUCOSE 99  BUN 11  CREATININE 0.77  CALCIUM 9.1   GFR: Estimated Creatinine Clearance: 71.2 mL/min (by C-G formula based on SCr of 0.77 mg/dL). Liver Function Tests: No results for input(s): AST, ALT, ALKPHOS, BILITOT, PROT, ALBUMIN in the last 168 hours. No results for input(s): LIPASE, AMYLASE in the last 168 hours. No results for input(s): AMMONIA in the last 168  hours. Coagulation Profile: No results for input(s): INR, PROTIME in the last 168 hours. Cardiac Enzymes: No results for input(s): CKTOTAL, CKMB, CKMBINDEX, TROPONINI in the last 168 hours. BNP (last 3 results) No results for input(s): PROBNP in the last 8760 hours. HbA1C: No results for input(s): HGBA1C in the last 72 hours. CBG: Recent Labs  Lab 01/01/20 0602  GLUCAP 96   Lipid Profile: No results for input(s): CHOL, HDL, LDLCALC, TRIG, CHOLHDL, LDLDIRECT in the last 72 hours. Thyroid Function Tests: No results for input(s): TSH, T4TOTAL, FREET4, T3FREE, THYROIDAB in the last 72 hours. Anemia Panel: No results for input(s): VITAMINB12, FOLATE, FERRITIN, TIBC, IRON, RETICCTPCT in the last 72 hours. Sepsis Labs: No results for input(s): PROCALCITON, LATICACIDVEN in the last 168 hours.  No results found for this or any previous visit (from the past 240 hour(s)).    Radiology Studies: No results found.    Scheduled Meds: . [MAR Hold] amoxicillin  1,000 mg Oral Q12H   And  . [MAR Hold] clarithromycin  500 mg Oral Q12H   And  . [MAR Hold] pantoprazole  40 mg Oral BID AC  . [MAR Hold] chlorhexidine  15 mL Mouth/Throat BID  . [MAR Hold] feeding supplement (ENSURE ENLIVE)  237 mL Oral BID BM  . [MAR Hold] loratadine  10 mg Oral Daily  . [MAR Hold] multivitamin with minerals  1 tablet Oral Daily   Continuous Infusions: . [MAR Hold] sodium chloride Stopped (12/31/19 2240)  . sodium chloride irrigation    . vancomycin       LOS: 16 days      Time spent: 20 minutes   Noralee Stain, DO [facility] 01/04/2020, 10:14 AM   Available via Epic secure chat 7am-7pm After these hours, please refer to coverage provider listed on [email or web address]

## 2020-01-04 NOTE — Plan of Care (Signed)

## 2020-01-04 NOTE — Progress Notes (Signed)
Orthopedic Tech Progress Note Patient Details:  Anna Campos May 06, 1966 [id number]  Ortho Devices Type of Ortho Device: Soft collar Ortho Device/Splint Location: neck Ortho Device/Splint Interventions: Ordered, Application   Post Interventions Patient Tolerated: Well Instructions Provided: Care of device   Jennye Moccasin 01/04/2020, 3:40 PM

## 2020-01-04 NOTE — Transfer of Care (Signed)
Immediate Anesthesia Transfer of Care Note  Patient: Anna Campos  Procedure(s) Performed: CERVICAL THREE- FOUR, CERVICAL FOUR-FIVE, CERVICAL FIVE- SIX, CERVCAL SIX- SEVEN ANTERIOR CERVICAL DECOMPRESSION/DISCECTOMY FUSION (N/A )  Patient Location: PACU  Anesthesia Type:General  Level of Consciousness: awake, alert , oriented and patient cooperative  Airway & Oxygen Therapy: Patient Spontanous Breathing and Patient connected to face mask oxygen  Post-op Assessment: Report given to RN and Post -op Vital signs reviewed and stable  Post vital signs: Reviewed and stable  Last Vitals:  Vitals Value Taken Time  BP 96/65 01/04/20 1515  Temp    Pulse 93 01/04/20 1518  Resp 21 01/04/20 1518  SpO2 100 % 01/04/20 1518  Vitals shown include unvalidated device data.  Last Pain:  Vitals:   01/04/20 0800  TempSrc:   PainSc: 0-No pain         Complications: No apparent anesthesia complications

## 2020-01-04 NOTE — Anesthesia Preprocedure Evaluation (Addendum)
Anesthesia Evaluation  Patient identified by MRN, date of birth, ID band Patient awake    Reviewed: Allergy & Precautions, NPO status , Patient's Chart, lab work & pertinent test results  Airway Mallampati: II  TM Distance: >3 FB     Dental   Pulmonary former smoker,    breath sounds clear to auscultation       Cardiovascular negative cardio ROS   Rhythm:Regular Rate:Normal     Neuro/Psych    GI/Hepatic negative GI ROS, Neg liver ROS,   Endo/Other  negative endocrine ROS  Renal/GU negative Renal ROS     Musculoskeletal   Abdominal   Peds  Hematology   Anesthesia Other Findings   Reproductive/Obstetrics                             Anesthesia Physical Anesthesia Plan  ASA: II  Anesthesia Plan: General   Post-op Pain Management:    Induction: Intravenous  PONV Risk Score and Plan: 3 and Ondansetron, Dexamethasone and Midazolam  Airway Management Planned: Oral ETT  Additional Equipment:   Intra-op Plan:   Post-operative Plan:   Informed Consent: I have reviewed the patients History and Physical, chart, labs and discussed the procedure including the risks, benefits and alternatives for the proposed anesthesia with the patient or authorized representative who has indicated his/her understanding and acceptance.     Dental advisory given  Plan Discussed with: Anesthesiologist and CRNA  Anesthesia Plan Comments:         Anesthesia Quick Evaluation

## 2020-01-04 NOTE — Op Note (Signed)
Date of procedure: 01/04/2020  Date of dictation: Same  Service: Neurosurgery  Preoperative diagnosis: C3-4, C4-5, C5-6, C67 spondylosis with stenosis and incomplete spinal cord injury with residual myelopathy  Postoperative diagnosis: Same  Procedure Name: C3-4, C4-5, C5-6, C6-7 anterior cervical decompression and fusion utilizing interbody cages, locally harvested autograft, and anterior plate instrumentation  Surgeon:Koben Daman A.Eliya Bubar, M.D.  Asst. Surgeon: Maisie Fus  Anesthesia: General  Indication: 54 year old female status post syncopal episode with resultant fall.  Patient discovered to have significant incomplete cervical spinal cord injury.  MRI scanning demonstrates evidence of marked cervical stenosis with severe spinal cord signal abnormality from C3 down to C6-7.  Patient has improved slightly with observation over the last 2 weeks.  Her other medical issues have been stabilized and she presents now for 4 level anterior cervical decompression and fusion in hopes of improving her symptoms.  Operative note: After induction anesthesia, patient position supine with neck slightly extended and held in place with halter traction.  Patient's anterior cervical region prepped and draped sterilely.  Incision made overlying C5.  Dissection performed on the right.  Retractor placed.  Fluoroscopy used.  Levels confirmed.  The spaces incised at all 4 levels.  Discectomies then performed using various instruments down to level the posterior annulus.  Microscope was brought in the field and used throughout the remainder of the discectomies.  Starting first at C3-4 remaining aspects of annulus and osteophytes were removed using high-speed drill down to level of the posterior logical limb.  Posterior logical was elevated and resected in piecemeal fashion.  Underlying thecal sac was then identified.  A wide central decompression then performed by undercutting the bodies of C3 and C4.  Decompression then proceeded  into neural foramina.  Wide anterior foraminotomies were performed along the course exiting C4 nerve roots bilaterally.  At this point a very thorough decompression had been achieved.  There was no evidence of injury to the thecal sac or nerve roots.  Procedures then repeated in a similar fashion at C4-5, C5-6 and C67 again all without complications.  [employer] anatomic peek cages were then packed with locally harvested autograft.  Cages then impacted in the place at all 4 levels.  Each cage was recessed slightly from the anterior cortical margin.  Medtronic Atlantis translational anterior cervical plate was then placed over the C3-C7 levels.  This then attached under fluoroscopic guidance using 13 mm fixed angle screws to each at all 5 levels.  All screws given a final tightening.  Locking screws were engaged at all levels.  Final images reveal good position of the cages and the hardware at the proper upper level with normal alignment of the spine.  Wound is then irrigated one final time.  Hemostasis was found to be good.  Wound is then closed in layers with Vicryl sutures.  Steri-Strips and sterile dressing were applied.  No apparent complications.  Patient tolerated the procedure well and she returned to the recovery room postop.

## 2020-01-04 NOTE — Anesthesia Procedure Notes (Signed)
Procedure Name: Intubation Date/Time: 01/04/2020 12:09 PM Performed by: Pearson Grippe, CRNA Pre-anesthesia Checklist: Patient identified, Emergency Drugs available, Suction available and Patient being monitored Patient Re-evaluated:Patient Re-evaluated prior to induction Oxygen Delivery Method: Circle system utilized Preoxygenation: Pre-oxygenation with 100% oxygen Induction Type: IV induction Ventilation: Mask ventilation without difficulty Laryngoscope Size: Carter and 2 Grade View: Grade I Tube type: Oral Tube size: 7.0 mm Number of attempts: 1 Airway Equipment and Method: Stylet and Oral airway Placement Confirmation: ETT inserted through vocal cords under direct vision,  positive ETCO2 and breath sounds checked- equal and bilateral Secured at: 21 cm Tube secured with: Tape Dental Injury: Teeth and Oropharynx as per pre-operative assessment

## 2020-01-05 DIAGNOSIS — E440 Moderate protein-calorie malnutrition: Secondary | ICD-10-CM

## 2020-01-05 LAB — CBC
HCT: 34.6 % — ABNORMAL LOW (ref 36.0–46.0)
Hemoglobin: 11 g/dL — ABNORMAL LOW (ref 12.0–15.0)
MCH: 30.3 pg (ref 26.0–34.0)
MCHC: 31.8 g/dL (ref 30.0–36.0)
MCV: 95.3 fL (ref 80.0–100.0)
Platelets: 394 10*3/uL (ref 150–400)
RBC: 3.63 MIL/uL — ABNORMAL LOW (ref 3.87–5.11)
RDW: 14.3 % (ref 11.5–15.5)
WBC: 17 10*3/uL — ABNORMAL HIGH (ref 4.0–10.5)
nRBC: 0 % (ref 0.0–0.2)

## 2020-01-05 LAB — BASIC METABOLIC PANEL
Anion gap: 12 (ref 5–15)
BUN: 18 mg/dL (ref 6–20)
CO2: 21 mmol/L — ABNORMAL LOW (ref 22–32)
Calcium: 9.2 mg/dL (ref 8.9–10.3)
Chloride: 99 mmol/L (ref 98–111)
Creatinine, Ser: 0.75 mg/dL (ref 0.44–1.00)
GFR calc Af Amer: >60 mL/min (ref >60)
GFR calc non Af Amer: >60 mL/min (ref >60)
Glucose, Bld: 136 mg/dL — ABNORMAL HIGH (ref 70–99)
Potassium: 5 mmol/L (ref 3.5–5.1)
Sodium: 132 mmol/L — ABNORMAL LOW (ref 135–145)

## 2020-01-05 LAB — HEPARIN LEVEL (UNFRACTIONATED): Heparin Unfractionated: <0.1 IU/mL — ABNORMAL LOW (ref 0.30–0.70)

## 2020-01-05 MED ORDER — ALUM & MAG HYDROXIDE-SIMETH 200-200-20 MG/5ML PO SUSP: 15.0000 mL | ORAL | Status: DC | PRN

## 2020-01-05 MED FILL — Thrombin For Soln 5000 Unit: CUTANEOUS | Qty: 5000 | Status: AC

## 2020-01-05 NOTE — Progress Notes (Addendum)
Providing Compassionate, Quality Care - Together   Subjective: Patient reports sore throat this morning. She has minor tingling sensation in her fifth digits bilaterally, but no other numbness or tingling.  Objective: Vital signs in last 24 hours: Temp:  [97.5 F (36.4 C)-99 F (37.2 C)] 98.6 F (37 C) (03/17 0736) Pulse Rate:  [77-105] 77 (03/17 0736) Resp:  [14-24] 19 (03/17 0736) BP: (93-104)/(50-75) 93/62 (03/17 0736) SpO2:  [95 %-100 %] 98 % (03/17 0736) Weight:  [60.7 kg] 60.7 kg (03/17 0446)  Intake/Output from previous day: 03/16 0701 - 03/17 0700 In: 1890 [P.O.:540; I.V.:800; IV Piggyback:500] Out: 1525 [Urine:1475; Blood:50] Intake/Output this shift: No intake/output data recorded.  Alert and oriented x 4 PERRLA CN II-XII grossly inact 3/5 strength in bilateral deltoids, 2/5 strength in bilateral biceps 2/5 grip strength bilaterally 3/5 bilateral hip flexors Incision is covered with Honeycomb dressing and Steri Strips; Dressing is clean, dry, and intact   Lab Results: Recent Labs    01/04/20 0308 01/05/20 0302  WBC 10.7* 17.0*  HGB 12.0 11.0*  HCT 37.8 34.6*  PLT 423* 394   BMET Recent Labs    01/05/20 0302  NA 132*  K 5.0  CL 99  CO2 21*  GLUCOSE 136*  BUN 18  CREATININE 0.75  CALCIUM 9.2    Studies/Results: DG Cervical Spine 2-3 Views  Result Date: 01/04/2020 CLINICAL DATA:  Cervical decompression and fusion EXAM: CERVICAL SPINE - 2-3 VIEW; DG C-ARM 1-60 MIN COMPARISON:  MRI 12/20/2019 FINDINGS: Two low resolution intraoperative spot views of the cervical spine. Total fluoroscopy time was 5 seconds. The images demonstrate anterior plate and fixating screws extending from C3 through C7 with interbody devices at C3-C4, C4-C5, C5-C6 and C6-C7. A linear lead or monitoring device projects anterior to C2. IMPRESSION: Intraoperative fluoroscopic assistance provided during cervical spine surgery Electronically Signed   By: Jasmine Pang M.D.   On:  01/04/2020 19:30   DG C-Arm 1-60 Min  Result Date: 01/04/2020 CLINICAL DATA:  Cervical decompression and fusion EXAM: CERVICAL SPINE - 2-3 VIEW; DG C-ARM 1-60 MIN COMPARISON:  MRI 12/20/2019 FINDINGS: Two low resolution intraoperative spot views of the cervical spine. Total fluoroscopy time was 5 seconds. The images demonstrate anterior plate and fixating screws extending from C3 through C7 with interbody devices at C3-C4, C4-C5, C5-C6 and C6-C7. A linear lead or monitoring device projects anterior to C2. IMPRESSION: Intraoperative fluoroscopic assistance provided during cervical spine surgery Electronically Signed   By: Jasmine Pang M.D.   On: 01/04/2020 19:30    Assessment/Plan: Mrs. Mccullar had a syncopal episode at home followed by profound weakness. She was admitted with streptococcal bacteremia and a pulmonary embolism. MRI scan of the patient's c-spine revealed significant stenosis. Mrs. Umeda a significant incomplete spinal cord injury secondary to her fall superimposed upon significant degenerative disease with stenosis most prominently at the C4-5 level but also significantly at the C3-4 and C5-6 levels. EGD performed by Dr. Marca Ancona on 12/27/2019 revealed 2 superficial prepyloric ulcers. Dental extractions performed on 12/30/2019 by Dr. Kristin Bruins. She underwent a C3-4, C4-5, C5-6, and C6-7 anterior cervical decompression and fusion by Dr. Jordan Likes on 01/04/2020.   LOS: 17 days    -Maintain soft cervical collar at all times -TED hose when OOB -Mobilize with therapies   Val Eagle, DNP, AGNP-C Nurse Practitioner  [facility] [address], [place], [place] [address] P: [phone]    F: [phone]  01/05/2020, 10:44 AM

## 2020-01-05 NOTE — Progress Notes (Signed)
Inpatient Rehabilitation-Admissions Coordinator   [facility] will begin insurance authorization process for possible admit.   Will update once there has been a determination.   Cheri Rous, OTR/L  Rehab Admissions Coordinator  [phone] 01/05/2020 3:57 PM

## 2020-01-05 NOTE — Progress Notes (Signed)
Occupational Therapy Re-Evaluation Patient Details Name: Anna Campos MRN: [id number] DOB: 01/05/1966 Today's Date: 01/05/2020    History of Present Illness 54 year old woman with no significant past medical history presenting with syncope and s/p fall. Pt found to have incomplete spincal cord injury, bacteremia, and bilat PEs. Underwent C 3-7 ACDF 3/16.    Clinical Impression   PTA, pt independent with ADL and mobility. Pt able to progress to EOB with min A and stand with Min A +2, but required increased assistance of Mod A +2  with stand pivot to advance BLE due to impaired sensory motor control. Requires min to total A with ADL tasks at this time as noted below. Pt is extremely motivated to be independent and is an excellent CIR candidate with a very supportive family. Will follow acutely.     Follow Up Recommendations  Supervision/Assistance - 24 hour;CIR    Equipment Recommendations  3 in 1 bedside commode    Recommendations for Other Services Rehab consult     Precautions / Restrictions Precautions Precautions: Fall;Cervical;Other (comment)(orthostatic) Required Braces or Orthoses: Cervical Brace      Mobility Bed Mobility Overal bed mobility: Needs Assistance Bed Mobility: Rolling;Sidelying to Sit;Sit to Sidelying Rolling: Min assist Sidelying to sit: Min assist       General bed mobility comments: VC for technique  Transfers Overall transfer level: Needs assistance   Transfers: Sit to/from Stand;Stand Pivot Transfers Sit to Stand: +2 physical assistance;Min assist Stand pivot transfers: Mod assist;+2 physical assistance       General transfer comment: Able to stadn with min A; Assist required to help pick up and place foot - ? propiroceptive deficits    Balance Overall balance assessment: Needs assistance Sitting-balance support: Feet supported Sitting balance-Leahy Scale: Fair       Standing balance-Leahy Scale: Poor Standing balance comment: requires  exteranl support                           ADL either performed or assessed with clinical judgement   ADL Overall ADL's : Needs assistance/impaired Eating/Feeding: Minimal assistance;Sitting   Grooming: Moderate assistance;Sitting   Upper Body Bathing: Moderate assistance;Sitting   Lower Body Bathing: Maximal assistance;Bed level   Upper Body Dressing : Maximal assistance;Sitting   Lower Body Dressing: Total assistance;Bed level   Toilet Transfer: Moderate assistance;+2 for physical assistance;+2 for safety/equipment Toilet Transfer Details (indicate cue type and reason): simulated with transfer to recliner Toileting- Clothing Manipulation and Hygiene: Total assistance       Functional mobility during ADLs: Moderate assistance;+2 for physical assistance(for stand pivot)       Vision         Perception     Praxis      Pertinent Vitals/Pain Pain Assessment: 0-10 Pain Score: 6  Pain Location: (neck/throat) Pain Descriptors / Indicators: Aching;Discomfort;Grimacing;Sore Pain Intervention(s): Limited activity within patient's tolerance     Hand Dominance Right   Extremity/Trunk Assessment Upper Extremity Assessment Upper Extremity Assessment: RUE deficits/detail;LUE deficits/detail RUE Deficits / Details: shoulder abd/add 3+/5l FF 3+/5 (moves into abduction/substitutes with scap elevation and abduction); Elbow felx 3/5 ext 1/5; sup/pron 2/5; wrist ext 3+/5; 1/5 wrist flexion; minimal if any digit movement - using tenodesis RUE Sensation: (decreased but pt states it is improving; "tingling" present) RUE Coordination: decreased fine motor;decreased gross motor LUE Deficits / Details: LUE overall stronger than R; shoulder @ 3+/5 throughout; elbow fex 3+/5; ext 3/5; supination 3+/5 pron 3+/5; wrist flex/ext 3+/5; gross  grasp - lacking @ 1 in from distal palmer crease; minimal abd/add of digits; minimal thumb movement; absent in-hand manipulation skills LUE  Sensation: decreased light touch LUE Coordination: decreased fine motor;decreased gross motor   Lower Extremity Assessment Lower Extremity Assessment: Defer to PT evaluation   Cervical / Trunk Assessment Cervical / Trunk Assessment: Other exceptions Cervical / Trunk Exceptions: ACDF C3-7   Communication Communication Communication: No difficulties   Cognition Arousal/Alertness: Awake/alert Behavior During Therapy: WFL for tasks assessed/performed Overall Cognitive Status: Within Functional Limits for tasks assessed                                     General Comments  Ted hose applied prior to session    Exercises     Shoulder Instructions      Home Living Family/patient expects to be discharged to:: Private residence Living Arrangements: Spouse/significant other Available Help at Discharge: Family;Available 24 hours/day Type of Home: House Home Access: Stairs to enter [employer] of Steps: 3-4 Entrance Stairs-Rails: Right Home Layout: One level     Bathroom Shower/Tub: Corporate investment banker: Standard     Home Equipment: None          Prior Functioning/Environment Level of Independence: Independent        Comments: works in Therapist, art for [employer], works from home, 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        OT Problem List: Decreased strength;Decreased activity tolerance;Impaired balance (sitting and/or standing);Decreased safety awareness;Decreased coordination;Pain;Impaired UE functional use;Increased edema;Decreased knowledge of use of DME or AE;Decreased range of motion      OT Treatment/Interventions: Self-care/ADL training;Therapeutic exercise;Neuromuscular education;DME and/or AE instruction;Therapeutic activities;Patient/family education;Balance training    OT Goals(Current goals can be found in the care plan section) Acute Rehab OT Goals Patient Stated Goal: to get better and be  independent OT Goal Formulation: With patient Time For Goal Achievement: 01/19/20 Potential to Achieve Goals: Good  OT Frequency: Min 3X/week   Barriers to D/C:            Co-evaluation PT/OT/SLP Co-Evaluation/Treatment: Yes Reason for Co-Treatment: Complexity of the patient's impairments (multi-system involvement);For patient/therapist safety;To address functional/ADL transfers   OT goals addressed during session: ADL's and self-care;Strengthening/ROM      AM-PAC OT "6 Clicks" Daily Activity     Outcome Measure Help from another person eating meals?: A Little Help from another person taking care of personal grooming?: A Lot Help from another person toileting, which includes using toliet, bedpan, or urinal?: A Lot Help from another person bathing (including washing, rinsing, drying)?: A Lot Help from another person to put on and taking off regular upper body clothing?: A Lot Help from another person to put on and taking off regular lower body clothing?: Total 6 Click Score: 12   End of Session Equipment Utilized During Treatment: Gait belt Nurse Communication: Mobility status;Need for lift equipment(Stedy)  Activity Tolerance: Patient tolerated treatment well Patient left: in chair;with call bell/phone within reach;with chair alarm set;with family/visitor present  OT Visit Diagnosis: Unsteadiness on feet (R26.81);Muscle weakness (generalized) (M62.81)                Time: 0254-2706 OT Time Calculation (min): 32 min Charges:  OT General Charges $OT Visit: 1 Visit OT Evaluation $OT Re-eval: [place], OT/L   Acute OT Clinical Specialist [place] Pager [phone] Office [phone]  Lakenzie Mcclafferty,HILLARY 01/05/2020, 2:30 PM

## 2020-01-05 NOTE — Progress Notes (Signed)
On call notified about yellow MEWS change; no new orders at this time.

## 2020-01-05 NOTE — Progress Notes (Signed)
PROGRESS NOTE  Anna Campos WJX:[id number] DOB: 25-Jul-1966 DOA: 12/18/2019 PCP: Farris Has, MD  Brief History   Anna Campos is a 54 year old female with no significant past medical history presenting with syncope. She has been having generalized fatigue. She has had poor p.o. intake and has not eaten in the last day. She stood up this evening and passed out. Family heard her fall. She did hit her head. She was told she was not unconscious for a long period of time. She was noted by EMS to be hypotensive and she was given a small fluid bolus.  She was admitted to ICU due to syncope, hypovolemic shock.  Due to EKG changes, cardiology was consulted.  Patient was weaned off Levophed and transferred to [facility] on 3/1.  She was found to have Streptococcus bacteremia.  Infectious disease consulted and recommended 2 weeks of ceftriaxone. She has completed this course.  Due to bacteremia, dentistry was also consulted. Patient underwent dental extraction on 3/11.  Patient developed anemia during her hospitalization, GI consulted and patient underwent EGD on 3/8.  EGD demonstrated normal esophagus, erythematous gastric mucosa with 2 non-bleeding ulcers. One with pigmented material. One endoclip was placed. The patient was also found to have erythematous duodenopathy. Recommendation is for protonix 40 mg bid x 2 months.   The patient has had bilateral extremity weakness with incomplete spinal cord injury C3-C6 documented on MRI. Neurosurgery was consulted. Weakness of distal extremities worse than proximal. The patient underwent C3-4,C4-5, C5-6, C6-7 anterior surgical decompression and fusion with cages and plate on 12/04/863. She has tolerated the procedure well.   Consultants  . Gastroenterology . Dentistry . Infectious Disease . Neurosurgery . Cardiology  Procedures  . EGD C3-4,C4-5, C5-6, C6-7 anterior surgical decompression and fusion with cages and plate on  7/84/6962  Antibiotics   Anti-infectives (From admission, onward)   Start     Dose/Rate Route Frequency Ordered Stop   01/04/20 1930  ceFAZolin (ANCEF) IVPB 1 g/50 mL premix     1 g 100 mL/hr over 30 Minutes Intravenous Every 8 hours 01/04/20 1817 01/05/20 0501   01/04/20 1252  bacitracin 50,000 Units in sodium chloride 0.9 % 500 mL irrigation  Status:  Discontinued       As needed 01/04/20 1252 01/04/20 1511   01/04/20 1000  vancomycin (VANCOCIN) IVPB 1000 mg/200 mL premix     1,000 mg 200 mL/hr over 60 Minutes Intravenous To [facility]Stay Surgical 01/03/20 1030 01/04/20 1127   12/30/19 1215  amoxicillin (AMOXIL) capsule 1,000 mg     1,000 mg Oral Every 12 hours 12/30/19 1213 01/13/20 0959   12/30/19 1215  clarithromycin (BIAXIN) tablet 500 mg     500 mg Oral Every 12 hours 12/30/19 1213 01/13/20 0959   12/30/19 0800  cefTRIAXone (ROCEPHIN) 2 g in sodium chloride 0.9 % 100 mL IVPB  Status:  Discontinued     2 g 200 mL/hr over 30 Minutes Intravenous To Surgery 12/30/19 0748 12/30/19 1056   12/21/19 1000  cefTRIAXone (ROCEPHIN) 2 g in sodium chloride 0.9 % 100 mL IVPB     2 g 200 mL/hr over 30 Minutes Intravenous Every 24 hours 12/21/19 0948 01/02/20 2056   12/20/19 2130  vancomycin (VANCOREADY) IVPB 750 mg/150 mL  Status:  Discontinued     750 mg 150 mL/hr over 60 Minutes Intravenous Every 12 hours 12/20/19 1825 12/21/19 0948   12/20/19 1530  ceFAZolin (ANCEF) IVPB 2g/100 mL premix  Status:  Discontinued  2 g 200 mL/hr over 30 Minutes Intravenous Every 8 hours 12/20/19 1526 12/20/19 1814   12/20/19 1000  vancomycin (VANCOREADY) IVPB 750 mg/150 mL  Status:  Discontinued     750 mg 150 mL/hr over 60 Minutes Intravenous Every 12 hours 12/20/19 0054 12/20/19 1019   12/20/19 0130  vancomycin (VANCOREADY) IVPB 1250 mg/250 mL     1,250 mg 166.7 mL/hr over 90 Minutes Intravenous  Once 12/20/19 0054 12/20/19 0356    .  Subjective  The patient is resting comfortably with collar in place.  No new complaints.  Objective   Vitals:  Vitals:   01/05/20 0736 01/05/20 1124  BP: 93/62 96/65  Pulse: 77 87  Resp: 19 18  Temp: 98.6 F (37 C) 98 F (36.7 C)  SpO2: 98% 98%   Exam:  Constitutional:  . The patient is awake, alert, and oriented x 3. No acute distress. Respiratory:  . No increased work of breathing. . No wheezes, rales, or rhonchi . No tactile fremitus Cardiovascular:  . Regular rate and rhythm . No murmurs, ectopy, or gallups. . No lateral PMI. No thrills. Abdomen:  . Abdomen is soft, non-tender, non-distended . No hernias, masses, or organomegaly . Normoactive bowel sounds.  Musculoskeletal:  . No cyanosis, clubbing, or edema Skin:  . No rashes, lesions, ulcers . palpation of skin: no induration or nodules Neurologic:  . CN 2-12 intact . Sensation all 4 extremities intact Psychiatric:  . Mental status o Mood, affect appropriate o Orientation to person, place, time  . judgment and insight appear intact  I have personally reviewed the following:   Today's Data  . Vitals, BMP, CBC  Micro Data  . Blood culture 12/18/2019 Positive for Streptococcus constellatus.  Imaging  . MRI C-spine  Scheduled Meds: . amoxicillin  1,000 mg Oral Q12H   And  . clarithromycin  500 mg Oral Q12H   And  . pantoprazole  40 mg Oral BID AC  . chlorhexidine  15 mL Mouth/Throat BID  . feeding supplement (ENSURE ENLIVE)  237 mL Oral BID BM  . loratadine  10 mg Oral Daily  . multivitamin with minerals  1 tablet Oral Daily  . sodium chloride flush  3 mL Intravenous Q12H   Continuous Infusions: . sodium chloride Stopped (12/31/19 2240)  . sodium chloride    . lactated ringers Stopped (01/05/20 0600)  . sodium chloride irrigation      Active Problems:   Syncope   Quadriplegia (HCC)   Hypotension   Bacteremia   Poor dentition   Spinal stenosis of cervical region   Malnutrition of moderate degree   LOS: 17 days   A & P  Hypovolemic shock and syncope:  Resolved. Admitted to ICU and now weaned off of Levophed.  Cortisol 23.5. Appreciate cardiology; EKG shows nonspecific findings and pretest probability for CAD is low.  Troponin negative.  Echocardiogram with normal LVEF, normal RV size and function without valvular abnormalities.  No sign of right heart strain. No further cardiac work-up recommended.  Strep constellatus bacteremia:ID consulted. Recommendation was for Rocephin for 2 weeks. The patient has completed this course. Repeat blood cultures obtained on 3/1 have had no growth.  Dental caries: CT maxillofacial on 12/18/2019 without mention of any dental caries or abscess. Orthopantogram on 12/24/2019 showed numerous dental caries. Mild lucency around many of the remaining lower teeth. Dentristy was consulted. The patient underwent dental extraction 3/11 with Dr. Robin Searing. Follow-up with primary dentist of her choice for fabrication of  upper and lower complete dentures after adequate healing. Follow-up with dental medicine for evaluation of healing and suture removal once discharged from this admission.   Pulmonary Embolus: CTA chest performed on 12/20/2019 demonstrated a small nonocclusive pulmonary emboli bilaterally involving segmental and subsegmental sized vessels. Doppler of lower extremities was negative for DVT.  The patient has been placed on IV heparin.  Plan to transition to [place] prior to discharge once surgical intervention is complete and she is cleared for anticoagulation by neurosurgery.  Bilateral extremity weakness with incomplete spinal cord injury C3-C6:Distal lower extremity weaker compared to proximal.  Pt underwent C3-4,C4-5, C5-6, C6-7 anterior surgical decompression and fusion with cages and plate on 2/62/0355. Plan is for the patient to discharge to CIR when cleared for discharge by neurosurgery.  Acute normocytic anemia/GI bleed/acute blood loss anemia: During her inpatient stay, the patient's hemoglobin dropped abruptly to  6.8 from 9-10 on admission. The patient received 2 units of PRBC's in transfusion on 12/25/2019. GI was consulted. The patient underwent EGD on 12/27/2019 which milderythematous mucosa in the entire gastric cavity. 2 superficial prepyloric ulcers 1 with clean base the other with pigmentation. 1 Endo Clip was deployed to close the mucosal defect. Erythematous duodenal bulb. PPI BIDrecommended for 2 months.Okay to restart IV heparin. H. pylori serology positive, start triple therapy clarithromycin/amoxcillin/PPI for 14 days starting 3/11. Hemoglobin stable. Continue to monitor.  I have seen and examined this patient myself. I have spent 36 minutes in her evaluation and care.  DVT prophylaxis: IV heparin Code Status: Full code Family Communication: No family at bedside Disposition Plan:   Patient is from home prior to admission.  Currently in-hospital treatment needed due to neurosurgery planned 3/16  Suspect patient will discharge CIR postoperatively when cleared for discharge by neurosurgery.  Leata Dominy, DO [facility] Direct contact: see [email or web address]  7PM-7AM contact night coverage as above 01/05/2020, 3:57 PM  LOS: 17 days

## 2020-01-05 NOTE — Progress Notes (Signed)
OT Treatment Note  Focus of session on educating pt/husband proper set up and use of AE to assist pt with self feeding given increased weakness.Began HEP for husband to complete with wife to address elbow/wrist and hand strengthening. Continue to recommend CIR for rehab.     01/05/20 1700  OT Visit Information  Last OT Received On 01/05/20  Assistance Needed +2  History of Present Illness 54 year old woman with no significant past medical history presenting with syncope and s/p fall. Pt found to have incomplete spincal cord injury, bacteremia, and bilat PEs. Underwent C 3-7 ACDF 3/16.   Precautions  Precautions Fall;Cervical;Other (comment)  Required Braces or Orthoses Cervical Brace  Cervical Brace Soft collar  Pain Assessment  Pain Assessment 0-10  Pain Score 6  Pain Location  (neck/throat)  Pain Descriptors / Indicators Aching;Discomfort;Grimacing;Sore  Pain Intervention(s) Limited activity within patient's tolerance  Cognition  Arousal/Alertness Awake/alert  Behavior During Therapy WFL for tasks assessed/performed  Overall Cognitive Status Within Functional Limits for tasks assessed  ADL  General ADL Comments Addressed self feeding. More difficult for pt since last session. Pt attmepted useof U-cuff but perefers red tubing using tenodesis grasp with R weaker hand. Pt unable to use lidded cup due to weight of cup and educate don bumping both hands onto cup to bring to mouth. Pt able to return demonstrate.   Exercises  Exercises General Upper Extremity  General Exercises - Upper Extremity  Shoulder Flexion Both;10 reps;AROM;AAROM;Seated  Shoulder ABduction AROM;AAROM;Both;10 reps;Seated  Elbow Flexion AROM;Both;10 reps;Seated  Elbow Extension AROM;AAROM;Both;10 reps;Seated  Wrist Flexion PROM;AROM;AAROM;Both;10 reps;Seated  Wrist Extension Both;10 reps;Strengthening;Seated  Digit Composite Flexion Left;10 reps;Seated;Squeeze ball  Other Exercises  Other Exercises Pt given  putty 9level 1) with built up foam/stylusto simulate using stylus on remote control  Other Exercises level 1 theraband issued to work on elbow flexion/extension  OT - End of Session  Activity Tolerance Patient tolerated treatment well  Patient left in chair;with call bell/phone within reach  Nurse Communication Mobility status;Need for lift equipment;Other (comment) (proper set up for self feeding)  OT Assessment/Plan  OT Plan Discharge plan remains appropriate  OT Visit Diagnosis Unsteadiness on feet (R26.81);Muscle weakness (generalized) (M62.81)  OT Frequency (ACUTE ONLY) Min 3X/week  Recommendations for Other Services Rehab consult  Follow Up Recommendations Supervision/Assistance - 24 hour;CIR  OT Equipment 3 in 1 bedside commode  AM-PAC OT "6 Clicks" Daily Activity Outcome Measure (Version 2)  Help from another person eating meals? 3  Help from another person taking care of personal grooming? 2  Help from another person toileting, which includes using toliet, bedpan, or urinal? 2  Help from another person bathing (including washing, rinsing, drying)? 2  Help from another person to put on and taking off regular upper body clothing? 2  Help from another person to put on and taking off regular lower body clothing? 1  6 Click Score 12  OT Goal Progression  Progress towards OT goals Progressing toward goals  Acute Rehab OT Goals  Patient Stated Goal to get better and be independent  OT Goal Formulation With patient  Time For Goal Achievement 01/19/20  Potential to Achieve Goals Good  ADL Goals  Pt Will Perform Eating with supervision;sitting;with adaptive utensils  Pt Will Perform Grooming with min assist;sitting  Pt Will Perform Upper Body Dressing with min assist;sitting  Pt Will Transfer to Toilet with min assist;bedside commode  Pt/caregiver will Perform Home Exercise Program With theraputty;With theraband;With written HEP provided;With Supervision;Increased strength;Increased  ROM;Both right  and left upper extremity  Additional ADL Goal #1 Pt will increase to fair dynamic sitting balance in prep for ADL tasks.  OT Time Calculation  OT Start Time (ACUTE ONLY) 1300  OT Stop Time (ACUTE ONLY) 1322  OT Time Calculation (min) 22 min  OT General Charges  $OT Visit 1 Visit  OT Treatments  $Self Care/Home Management  8-22 mins  Maurie Boettcher, OT/L   Acute OT Clinical Specialist [place] Pager [phone] Office [phone]

## 2020-01-05 NOTE — Evaluation (Addendum)
Physical Therapy Re-Evaluation Patient Details Name: Anna Campos MRN: [id number] DOB: 1966/09/28 Today's Date: 01/05/2020   History of Present Illness  54 year old woman with no significant past medical history presenting with syncope and s/p fall. Pt found to have incomplete spincal cord injury, bacteremia, and bilat PEs. Underwent C 3-7 ACDF 3/16.   Clinical Impression  Pt re-evaluated s/p procedure listed above. Demonstrates decreased functional mobility secondary to weakness (R side weaker than left), balance impairments, and proprioceptive deficits. Requiring two person min-moderate assist to for transfers and to advance BLE due to decreased sensory motor control. Continue to highly recommend CIR at discharge and suspect good progress considering pt age, PLOF, motivation and family support.    Follow Up Recommendations CIR    Equipment Recommendations  Wheelchair (measurements PT);Wheelchair cushion (measurements PT)    Recommendations for Other Services Rehab consult     Precautions / Restrictions Precautions Precautions: Fall;Cervical;Other (comment)(orthostatic) Required Braces or Orthoses: Cervical Brace Cervical Brace: Soft collar Restrictions Weight Bearing Restrictions: No      Mobility  Bed Mobility Overal bed mobility: Needs Assistance Bed Mobility: Rolling;Sidelying to Sit;Sit to Sidelying Rolling: Min guard Sidelying to sit: Mod assist       General bed mobility comments: Pt able to roll towards right with cues for bending up L knee and reaching for railing with LUE. Increased time to bring legs off edge of bed. ModA for trunk assist up to sitting position.  Transfers Overall transfer level: Needs assistance   Transfers: Sit to/from Stand;Stand Pivot Transfers Sit to Stand: +2 physical assistance;Min assist Stand pivot transfers: Mod assist;+2 physical assistance       General transfer comment: Able to stand with min A from edge of bed. ModA for stand  pivot towards left; manual assist provided for weight shifting and placement of feet.   Ambulation/Gait                Stairs            Wheelchair Mobility    Modified Rankin (Stroke Patients Only)       Balance Overall balance assessment: Needs assistance Sitting-balance support: Feet supported Sitting balance-Leahy Scale: Fair       Standing balance-Leahy Scale: Poor Standing balance comment: Requires external support                             Pertinent Vitals/Pain Pain Assessment: 0-10 Pain Score: 6  Pain Location: (neck/throat) Pain Descriptors / Indicators: Aching;Discomfort;Grimacing;Sore Pain Intervention(s): Limited activity within patient's tolerance    Home Living Family/patient expects to be discharged to:: Private residence Living Arrangements: Spouse/significant other Available Help at Discharge: Family;Available 24 hours/day Type of Home: House Home Access: Stairs to enter Entrance Stairs-Rails: Right Entrance Stairs-Number of Steps: 3-4 Home Layout: One level Home Equipment: None      Prior Function Level of Independence: Independent         Comments: works in Therapist, art for [employer], works from home, 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 Dominance   Dominant Hand: Right    Extremity/Trunk Assessment   Upper Extremity Assessment Upper Extremity Assessment: Defer to OT evaluation RUE Deficits / Details: shoulder abd/add 3+/5l FF 3+/5 (moves into abduction/substitutes with scap elevation and abduction); Elbow felx 3/5 ext 1/5; sup/pron 2/5; wrist ext 3+/5; 1/5 wrist flexion; minimal if any digit movement - using tenodesis RUE Sensation: (decreased but pt states it is improving; "  tingling" present) RUE Coordination: decreased fine motor;decreased gross motor LUE Deficits / Details: LUE overall stronger than R; shoulder @ 3+/5 throughout; elbow fex 3+/5; ext 3/5; supination 3+/5 pron 3+/5; wrist  flex/ext 3+/5; gross grasp - lacking @ 1 in from distal palmer crease; minimal abd/add of digits; minimal thumb movement; absent in-hand manipulation skills LUE Sensation: decreased light touch LUE Coordination: decreased fine motor;decreased gross motor    Lower Extremity Assessment Lower Extremity Assessment: RLE deficits/detail;LLE deficits/detail RLE Deficits / Details: Hip flexion 2+/5, knee extension 4/5, ankle dorsiflexion 2/5 LLE Deficits / Details: Hip flexion 3+/5, knee extension 4/5, ankle dorsiflexion 5/5    Cervical / Trunk Assessment Cervical / Trunk Assessment: Other exceptions Cervical / Trunk Exceptions: ACDF C3-7  Communication   Communication: No difficulties  Cognition Arousal/Alertness: Awake/alert Behavior During Therapy: WFL for tasks assessed/performed Overall Cognitive Status: Within Functional Limits for tasks assessed                                        General Comments General comments (skin integrity, edema, etc.): Ted hose applied prior to session    Exercises Other Exercises Other Exercises: Encouraged seated ankle AROM doing "ABC's"   Assessment/Plan    PT Assessment Patient needs continued PT services  PT Problem List Decreased strength;Decreased range of motion;Decreased activity tolerance;Decreased balance;Decreased mobility;Decreased coordination;Decreased cognition;Decreased knowledge of use of DME;Decreased safety awareness;Impaired tone;Impaired sensation;Pain       PT Treatment Interventions DME instruction;Gait training;Stair training;Functional mobility training;Therapeutic activities;Therapeutic exercise;Balance training;Neuromuscular re-education    PT Goals (Current goals can be found in the Care Plan section)  Acute Rehab PT Goals Patient Stated Goal: to get better and be independent PT Goal Formulation: With patient Time For Goal Achievement: 01/19/20 Potential to Achieve Goals: Good    Frequency Min  5X/week   Barriers to discharge        Co-evaluation PT/OT/SLP Co-Evaluation/Treatment: Yes Reason for Co-Treatment: Complexity of the patient's impairments (multi-system involvement);For patient/therapist safety;To address functional/ADL transfers PT goals addressed during session: Mobility/safety with mobility OT goals addressed during session: ADL's and self-care;Strengthening/ROM       AM-PAC PT "6 Clicks" Mobility  Outcome Measure Help needed turning from your back to your side while in a flat bed without using bedrails?: A Little Help needed moving from lying on your back to sitting on the side of a flat bed without using bedrails?: A Lot Help needed moving to and from a bed to a chair (including a wheelchair)?: A Lot Help needed standing up from a chair using your arms (e.g., wheelchair or bedside chair)?: A Little Help needed to walk in hospital room?: Total Help needed climbing 3-5 steps with a railing? : Total 6 Click Score: 12    End of Session Equipment Utilized During Treatment: Gait belt;Cervical collar Activity Tolerance: Patient tolerated treatment well Patient left: in chair;with call bell/phone within reach;with chair alarm set Nurse Communication: Mobility status PT Visit Diagnosis: Unsteadiness on feet (R26.81);Other abnormalities of gait and mobility (R26.89);Repeated falls (R29.6);Muscle weakness (generalized) (M62.81);History of falling (Z91.81);Ataxic gait (R26.0);Difficulty in walking, not elsewhere classified (R26.2)    Time: 8921-1941 PT Time Calculation (min) (ACUTE ONLY): 35 min   Charges:   PT Evaluation $PT Re-evaluation: 1 Re-eval            Lillia Pauls, PT, DPT Acute Rehabilitation Services Pager [phone] Office [phone]   Norval Morton 01/05/2020, 5:00  PM   

## 2020-01-06 LAB — COMPREHENSIVE METABOLIC PANEL
ALT: 21 U/L (ref 0–44)
AST: 21 U/L (ref 15–41)
Albumin: 3 g/dL — ABNORMAL LOW (ref 3.5–5.0)
Alkaline Phosphatase: 108 U/L (ref 38–126)
Anion gap: 9 (ref 5–15)
BUN: 13 mg/dL (ref 6–20)
CO2: 28 mmol/L (ref 22–32)
Calcium: 8.9 mg/dL (ref 8.9–10.3)
Chloride: 99 mmol/L (ref 98–111)
Creatinine, Ser: 0.7 mg/dL (ref 0.44–1.00)
GFR calc Af Amer: >60 mL/min (ref >60)
GFR calc non Af Amer: >60 mL/min (ref >60)
Glucose, Bld: 117 mg/dL — ABNORMAL HIGH (ref 70–99)
Potassium: 4.6 mmol/L (ref 3.5–5.1)
Sodium: 136 mmol/L (ref 135–145)
Total Bilirubin: 0.2 mg/dL — ABNORMAL LOW (ref 0.3–1.2)
Total Protein: 5.9 g/dL — ABNORMAL LOW (ref 6.5–8.1)

## 2020-01-06 LAB — CBC WITH DIFFERENTIAL/PLATELET
Abs Immature Granulocytes: 0.05 10*3/uL (ref 0.00–0.07)
Basophils Absolute: 0 10*3/uL (ref 0.0–0.1)
Basophils Relative: 0 %
Eosinophils Absolute: 0 10*3/uL (ref 0.0–0.5)
Eosinophils Relative: 0 %
HCT: 31.3 % — ABNORMAL LOW (ref 36.0–46.0)
Hemoglobin: 9.8 g/dL — ABNORMAL LOW (ref 12.0–15.0)
Immature Granulocytes: 0 %
Lymphocytes Relative: 14 %
Lymphs Abs: 1.7 10*3/uL (ref 0.7–4.0)
MCH: 30.2 pg (ref 26.0–34.0)
MCHC: 31.3 g/dL (ref 30.0–36.0)
MCV: 96.3 fL (ref 80.0–100.0)
Monocytes Absolute: 1.6 10*3/uL — ABNORMAL HIGH (ref 0.1–1.0)
Monocytes Relative: 14 %
Neutro Abs: 8.3 10*3/uL — ABNORMAL HIGH (ref 1.7–7.7)
Neutrophils Relative %: 72 %
Platelets: 309 10*3/uL (ref 150–400)
RBC: 3.25 MIL/uL — ABNORMAL LOW (ref 3.87–5.11)
RDW: 14.1 % (ref 11.5–15.5)
WBC: 11.6 10*3/uL — ABNORMAL HIGH (ref 4.0–10.5)
nRBC: 0 % (ref 0.0–0.2)

## 2020-01-06 MED ORDER — CYCLOBENZAPRINE HCL 10 MG PO TABS
5.0000 mg | ORAL_TABLET | Freq: Three times a day (TID) | ORAL | Status: DC | PRN
  Administered 2020-01-06 – 2020-01-10 (×6): 5 mg via ORAL
  Filled 2020-01-06 (×6): qty 1

## 2020-01-06 NOTE — Progress Notes (Signed)
Physical Therapy Treatment Patient Details Name: Anna Campos MRN: [id number] DOB: 08-27-1966 Today's Date: 01/06/2020    History of Present Illness 54 year old woman with no significant past medical history presenting with syncope and s/p fall. Pt found to have incomplete spincal cord injury, bacteremia, and bilat PEs. Underwent C 3-7 ACDF 3/16.     PT Comments    Pt making good progress towards physical therapy goals, remaining very motivated and eager to participate. Session focused on progression of transfer and gait training in addition to ADL task at sink to promote standing endurance. Pt requiring two person moderate assist for functional mobility tasks. Ambulating 6 feet with an Ethelene Hal with tactile facilitation for right knee stability and upright posture. Able to tolerate standing at the sink ~10 minutes with external support for balance. Did have episode of urinary incontinence in standing and assisted with peri care and gown/sock change. Continue to recommend comprehensive inpatient rehab (CIR) for post-acute therapy needs.     Follow Up Recommendations  CIR     Equipment Recommendations  Wheelchair (measurements PT);Wheelchair cushion (measurements PT)    Recommendations for Other Services       Precautions / Restrictions Precautions Precautions: Fall;Cervical;Other (comment)(orthostatic) Precaution Comments: central cord. Monitor BP - orthostatic hypotension Required Braces or Orthoses: Cervical Brace Cervical Brace: Soft collar Restrictions Weight Bearing Restrictions: No    Mobility  Bed Mobility Overal bed mobility: Needs Assistance Bed Mobility: Rolling;Sidelying to Sit Rolling: Min guard Sidelying to sit: Mod assist       General bed mobility comments: Pt able to roll and reach with LUE for rail. RUE sore this date impacting ability to push up through arm into sitting. Pt able to move BLEs off bed requiring mod assist for trunk to sit upright.    Transfers Overall transfer level: Needs assistance Equipment used: None(Eva walker and gait belt) Transfers: Sit to/from Stand Sit to Stand: Mod assist;+2 physical assistance;+2 safety/equipment Stand pivot transfers: Mod assist;+2 physical assistance;+2 safety/equipment       General transfer comment: ModA + 2 to boost up into standing from edge of bed and chair. Cues for foot/hand placement  Ambulation/Gait Ambulation/Gait assistance: Mod assist;+2 physical assistance;+2 safety/equipment Gait Distance (Feet): 6 Feet Assistive device: (Eva walker) Gait Pattern/deviations: Step-through pattern;Decreased dorsiflexion - right;Decreased stance time - right;Decreased weight shift to right Gait velocity: decreased   General Gait Details: Min facilitation for anterior progression of RLE, tactile cues at posterior knee to prevent hyperextension, max cues for upright posture/hip and knee extension   Stairs             Wheelchair Mobility    Modified Rankin (Stroke Patients Only)       Balance Overall balance assessment: Needs assistance Sitting-balance support: Feet supported Sitting balance-Leahy Scale: Fair       Standing balance-Leahy Scale: Poor Standing balance comment: Requires external support                            Cognition Arousal/Alertness: Awake/alert Behavior During Therapy: WFL for tasks assessed/performed Overall Cognitive Status: Within Functional Limits for tasks assessed                                 General Comments: Pt pleasant and motivated to participate in therapy.       Exercises      General Comments General comments (skin integrity, edema, etc.):  No signs/symptoms of distress.       Pertinent Vitals/Pain Pain Assessment: Faces Faces Pain Scale: Hurts a little bit Pain Location: right arm Pain Descriptors / Indicators: Sore Pain Intervention(s): Monitored during session    Home Living                       Prior Function            PT Goals (current goals can now be found in the care plan section) Acute Rehab PT Goals Patient Stated Goal: to get better and be independent Potential to Achieve Goals: Good Progress towards PT goals: Progressing toward goals    Frequency    Min 5X/week      PT Plan Current plan remains appropriate    Co-evaluation PT/OT/SLP Co-Evaluation/Treatment: Yes Reason for Co-Treatment: Complexity of the patient's impairments (multi-system involvement);Necessary to address cognition/behavior during functional activity;For patient/therapist safety;To address functional/ADL transfers PT goals addressed during session: Mobility/safety with mobility OT goals addressed during session: ADL's and self-care;Strengthening/ROM      AM-PAC PT "6 Clicks" Mobility   Outcome Measure  Help needed turning from your back to your side while in a flat bed without using bedrails?: A Little Help needed moving from lying on your back to sitting on the side of a flat bed without using bedrails?: A Lot Help needed moving to and from a bed to a chair (including a wheelchair)?: A Lot Help needed standing up from a chair using your arms (e.g., wheelchair or bedside chair)?: A Lot Help needed to walk in hospital room?: A Lot Help needed climbing 3-5 steps with a railing? : Total 6 Click Score: 12    End of Session Equipment Utilized During Treatment: Gait belt;Cervical collar Activity Tolerance: Patient tolerated treatment well Patient left: in chair;with call bell/phone within reach;with chair alarm set Nurse Communication: Mobility status PT Visit Diagnosis: Unsteadiness on feet (R26.81);Other abnormalities of gait and mobility (R26.89);Repeated falls (R29.6);Muscle weakness (generalized) (M62.81);History of falling (Z91.81);Ataxic gait (R26.0);Difficulty in walking, not elsewhere classified (R26.2)     Time: 7672-0947 PT Time Calculation (min) (ACUTE  ONLY): 45 min  Charges:  $Gait Training: 8-22 mins                       Anna Campos, PT, DPT Acute Rehabilitation Services Pager [phone] Office [phone]    Anna Campos 01/06/2020, 10:01 AM

## 2020-01-06 NOTE — Progress Notes (Addendum)
Providing Compassionate, Quality Care - Together   Subjective: Patient reports muscle spasms in her RLE. Her pain is slightly increased today. She reports she was able to stand for 10 minutes with therapy today and walked 6 steps.  Objective: Vital signs in last 24 hours: Temp:  [98 F (36.7 C)-98.7 F (37.1 C)] 98.6 F (37 C) (03/18 0900) Pulse Rate:  [71-82] 77 (03/18 0900) Resp:  [10-18] 18 (03/18 0900) BP: (87-101)/(49-64) 87/55 (03/18 0900) SpO2:  [97 %-99 %] 98 % (03/18 0900) Weight:  [60.5 kg] 60.5 kg (03/18 0500)  Intake/Output from previous day: 03/17 0701 - 03/18 0700 In: 0  Out: 1000 [Urine:1000] Intake/Output this shift: Total I/O In: 320 [P.O.:320] Out: -   Alert and oriented x 4 PERRLA CN II-XII grossly inact 3/5 strength in right deltoid, 4/5 in left deltoid, 4-/5 strength in bilateral biceps 2/5 grip strength bilaterally 4-/5 left hip flexor, 3/5 right hip flexor Incision is covered with Honeycomb dressing and Steri Strips; Dressing is clean, dry, and intact; Patient in soft collar  Lab Results: Recent Labs    01/05/20 0302 01/06/20 0402  WBC 17.0* 11.6*  HGB 11.0* 9.8*  HCT 34.6* 31.3*  PLT 394 309   BMET Recent Labs    01/05/20 0302 01/06/20 0402  NA 132* 136  K 5.0 4.6  CL 99 99  CO2 21* 28  GLUCOSE 136* 117*  BUN 18 13  CREATININE 0.75 0.70  CALCIUM 9.2 8.9    Studies/Results: DG Cervical Spine 2-3 Views  Result Date: 01/04/2020 CLINICAL DATA:  Cervical decompression and fusion EXAM: CERVICAL SPINE - 2-3 VIEW; DG C-ARM 1-60 MIN COMPARISON:  MRI 12/20/2019 FINDINGS: Two low resolution intraoperative spot views of the cervical spine. Total fluoroscopy time was 5 seconds. The images demonstrate anterior plate and fixating screws extending from C3 through C7 with interbody devices at C3-C4, C4-C5, C5-C6 and C6-C7. A linear lead or monitoring device projects anterior to C2. IMPRESSION: Intraoperative fluoroscopic assistance provided  during cervical spine surgery Electronically Signed   By: Donavan Foil M.D.   On: 01/04/2020 19:30   DG C-Arm 1-60 Min  Result Date: 01/04/2020 CLINICAL DATA:  Cervical decompression and fusion EXAM: CERVICAL SPINE - 2-3 VIEW; DG C-ARM 1-60 MIN COMPARISON:  MRI 12/20/2019 FINDINGS: Two low resolution intraoperative spot views of the cervical spine. Total fluoroscopy time was 5 seconds. The images demonstrate anterior plate and fixating screws extending from C3 through C7 with interbody devices at C3-C4, C4-C5, C5-C6 and C6-C7. A linear lead or monitoring device projects anterior to C2. IMPRESSION: Intraoperative fluoroscopic assistance provided during cervical spine surgery Electronically Signed   By: Donavan Foil M.D.   On: 01/04/2020 19:30    Assessment/Plan: Mrs. Holthaus had a syncopal episode at home followed by profound weakness. She was admitted withstreptococcalbacteremia and a pulmonary embolism. MRI scan of the patient's c-spine revealed significant stenosis. Mrs. Lauman a significant incomplete spinal cord injury secondary to her fall superimposed upon significant degenerative disease with stenosis most prominently at the C4-5 level but also significantly at the C3-4 and C5-6 levels.EGD performed by Dr. Therisa Doyne on 12/27/2019 revealed 2 superficial prepyloric ulcers. Dental extractions performed on 12/30/2019 by Dr. Enrique Sack. She underwent a C3-4, C4-5, C5-6, and C6-7 anterior cervical decompression and fusion by Dr. Annette Stable on 01/04/2020.   LOS: 18 days    -Restart heparin 01/07/2020 -Continue to mobilize -Utilize ordered PRN medications for muscle spasms   Viona Gilmore, DNP, AGNP-C Nurse Practitioner  [facility] [address], [place], [place] [address] P: [phone]    F: [phone]  01/06/2020, 12:02 PM

## 2020-01-06 NOTE — Progress Notes (Signed)
PROGRESS NOTE  Anna Campos JIR:[id number] DOB: 1965-12-31 DOA: 12/18/2019 PCP: London Pepper, MD  Brief History   Anna Campos is a 54 year old female with no significant past medical history presenting with syncope. She has been having generalized fatigue. She has had poor p.o. intake and has not eaten in the last day. She stood up this evening and passed out. Family heard her fall. She did hit her head. She was told she was not unconscious for a long period of time. She was noted by EMS to be hypotensive and she was given a small fluid bolus.  She was admitted to ICU due to syncope, hypovolemic shock.  Due to EKG changes, cardiology was consulted.  Patient was weaned off Levophed and transferred to [facility] hospitalist service on 3/1.  She was found to have Streptococcus bacteremia.  Infectious disease consulted and recommended 2 weeks of ceftriaxone. She has completed this course.  Due to bacteremia, dentistry was also consulted. Patient underwent dental extraction on 3/11.  Patient developed anemia during her hospitalization, GI consulted and patient underwent EGD on 3/8.  EGD demonstrated normal esophagus, erythematous gastric mucosa with 2 non-bleeding ulcers. One with pigmented material. One endoclip was placed. The patient was also found to have erythematous duodenopathy. Recommendation is for protonix 40 mg bid x 2 months.   The patient has had bilateral extremity weakness with incomplete spinal cord injury C3-C6 documented on MRI. Neurosurgery was consulted. Weakness of distal extremities worse than proximal. The patient underwent C3-4,C4-5, C5-6, C6-7 anterior surgical decompression and fusion with cages and plate on 7/51/0258. She has tolerated the procedure well.   Plan is for the patient to discharge to CIR when cleared for discharge by neurosurgery and bed is available.  Consultants  . Gastroenterology . Dentistry . Infectious Disease . Neurosurgery . Cardiology  Procedures  .  EGD C3-4,C4-5, C5-6, C6-7 anterior surgical decompression and fusion with cages and plate on 03/17/7823  Antibiotics   Anti-infectives (From admission, onward)   Start     Dose/Rate Route Frequency Ordered Stop   01/04/20 1930  ceFAZolin (ANCEF) IVPB 1 g/50 mL premix     1 g 100 mL/hr over 30 Minutes Intravenous Every 8 hours 01/04/20 1817 01/05/20 0501   01/04/20 1252  bacitracin 50,000 Units in sodium chloride 0.9 % 500 mL irrigation  Status:  Discontinued       As needed 01/04/20 1252 01/04/20 1511   01/04/20 1000  vancomycin (VANCOCIN) IVPB 1000 mg/200 mL premix     1,000 mg 200 mL/hr over 60 Minutes Intravenous To [facility]Stay Surgical 01/03/20 1030 01/04/20 1127   12/30/19 1215  amoxicillin (AMOXIL) capsule 1,000 mg     1,000 mg Oral Every 12 hours 12/30/19 1213 01/13/20 0959   12/30/19 1215  clarithromycin (BIAXIN) tablet 500 mg     500 mg Oral Every 12 hours 12/30/19 1213 01/13/20 0959   12/30/19 0800  cefTRIAXone (ROCEPHIN) 2 g in sodium chloride 0.9 % 100 mL IVPB  Status:  Discontinued     2 g 200 mL/hr over 30 Minutes Intravenous To Surgery 12/30/19 0748 12/30/19 1056   12/21/19 1000  cefTRIAXone (ROCEPHIN) 2 g in sodium chloride 0.9 % 100 mL IVPB     2 g 200 mL/hr over 30 Minutes Intravenous Every 24 hours 12/21/19 0948 01/02/20 2056   12/20/19 2130  vancomycin (VANCOREADY) IVPB 750 mg/150 mL  Status:  Discontinued     750 mg 150 mL/hr over 60 Minutes Intravenous Every 12 hours 12/20/19 1825 12/21/19  2094   12/20/19 1530  ceFAZolin (ANCEF) IVPB 2g/100 mL premix  Status:  Discontinued     2 g 200 mL/hr over 30 Minutes Intravenous Every 8 hours 12/20/19 1526 12/20/19 1814   12/20/19 1000  vancomycin (VANCOREADY) IVPB 750 mg/150 mL  Status:  Discontinued     750 mg 150 mL/hr over 60 Minutes Intravenous Every 12 hours 12/20/19 0054 12/20/19 1019   12/20/19 0130  vancomycin (VANCOREADY) IVPB 1250 mg/250 mL     1,250 mg 166.7 mL/hr over 90 Minutes Intravenous  Once 12/20/19 0054  12/20/19 0356     Subjective  The patient is sitting up in bed. She is complaining of increased pain and muscle spasms in her lower extremities bilaterally.  Objective   Vitals:  Vitals:   01/06/20 0900 01/06/20 1216  BP: (!) 87/55 97/63  Pulse: 77 87  Resp: 18 17  Temp: 98.6 F (37 C) 99.1 F (37.3 C)  SpO2: 98% 100%   Exam:  Constitutional:  . The patient is awake, alert, and oriented x 3. Mild distress from spasms in lower extremitiese Respiratory:  . No increased work of breathing. . No wheezes, rales, or rhonchi . No tactile fremitus Cardiovascular:  . Regular rate and rhythm . No murmurs, ectopy, or gallups. . No lateral PMI. No thrills. Abdomen:  . Abdomen is soft, non-tender, non-distended . No hernias, masses, or organomegaly . Normoactive bowel sounds.  Musculoskeletal:  . No cyanosis, clubbing, or edema Skin:  . No rashes, lesions, ulcers . palpation of skin: no induration or nodules Neurologic:  . CN 2-12 intact . Sensation all 4 extremities intact Psychiatric:  . Mental status o Mood, affect appropriate o Orientation to person, place, time  . judgment and insight appear intact  I have personally reviewed the following:   Today's Data  . Vitals, CMP, CBC  Micro Data  . Blood culture 12/18/2019 Positive for Streptococcus constellatus.  Imaging  . MRI C-spine  Scheduled Meds: . amoxicillin  1,000 mg Oral Q12H   And  . clarithromycin  500 mg Oral Q12H   And  . pantoprazole  40 mg Oral BID AC  . chlorhexidine  15 mL Mouth/Throat BID  . feeding supplement (ENSURE ENLIVE)  237 mL Oral BID BM  . loratadine  10 mg Oral Daily  . multivitamin with minerals  1 tablet Oral Daily  . sodium chloride flush  3 mL Intravenous Q12H   Continuous Infusions: . sodium chloride Stopped (12/31/19 2240)  . sodium chloride    . lactated ringers Stopped (01/05/20 0600)  . sodium chloride irrigation      Active Problems:   Syncope   Quadriplegia (HCC)    Hypotension   Bacteremia   Poor dentition   Spinal stenosis of cervical region   Malnutrition of moderate degree   LOS: 18 days   A & P  Hypovolemic shock and syncope: Resolved. Admitted to ICU and now weaned off of Levophed.  Cortisol 23.5. Appreciate cardiology; EKG shows nonspecific findings and pretest probability for CAD is low.  Troponin negative.  Echocardiogram with normal LVEF, normal RV size and function without valvular abnormalities.  No sign of right heart strain. No further cardiac work-up recommended.  Strep constellatus bacteremia:ID consulted. Recommendation was for Rocephin for 2 weeks. The patient has completed this course. Repeat blood cultures obtained on 3/1 have had no growth.  Dental caries: CT maxillofacial on 12/18/2019 without mention of any dental caries or abscess. Orthopantogram on 12/24/2019 showed numerous  dental caries. Mild lucency around many of the remaining lower teeth. Dentristy was consulted. The patient underwent dental extraction 3/11 with Dr. Robin Searing. Follow-up with primary dentist of her choice for fabrication of upper and lower complete dentures after adequate healing. Follow-up with dental medicine for evaluation of healing and suture removal once discharged from this admission.   Pulmonary Embolus: CTA chest performed on 12/20/2019 demonstrated a small nonocclusive pulmonary emboli bilaterally involving segmental and subsegmental sized vessels. Doppler of lower extremities was negative for DVT.  Neurosurgery has cleared the patient to initiate heparin on 01/07/2020. Plan to transition to NOAC prior to discharge.  Bilateral extremity weakness with incomplete spinal cord injury C3-C6:Distal lower extremity weaker compared to proximal.  Pt underwent C3-4,C4-5, C5-6, C6-7 anterior surgical decompression and fusion with cages and plate on 7/51/0258. Plan is for the patient to discharge to CIR when cleared for discharge by neurosurgery.  Acute normocytic  anemia/GI bleed/acute blood loss anemia: During her inpatient stay, the patient's hemoglobin dropped abruptly to 6.8 from 9-10 on admission. The patient received 2 units of PRBC's in transfusion on 12/25/2019. GI was consulted. The patient underwent EGD on 12/27/2019 which milderythematous mucosa in the entire gastric cavity. 2 superficial prepyloric ulcers 1 with clean base the other with pigmentation. 1 Endo Clip was deployed to close the mucosal defect. Erythematous duodenal bulb. PPI BIDrecommended for 2 months.Okay to restart IV heparin. H. pylori serology positive, start triple therapy clarithromycin/amoxcillin/PPI for 14 days starting 3/11. Hemoglobin stable. Continue to monitor.  I have seen and examined this patient myself. I have spent 32 minutes in her evaluation and care.  DVT prophylaxis: IV heparin Code Status: Full code Family Communication: No family at bedside Disposition Plan:   Patient is from home prior to admission.  Currently in-hospital treatment needed due to neurosurgery planned 3/16  Suspect patient will discharge CIR postoperatively when cleared for discharge by neurosurgery   Lititia Sen, DO Anna Campos Direct contact: see [email or web address]  7PM-7AM contact night coverage as above 01/06/2020, 3:30 PM  LOS: 17 days

## 2020-01-06 NOTE — Progress Notes (Signed)
Occupational Therapy Treatment Patient Details Name: Anna Campos MRN: [id number] DOB: 04-24-1966 Today's Date: 01/06/2020    History of present illness 54 year old woman with no significant past medical history presenting with syncope and s/p fall. Pt found to have incomplete spincal cord injury, bacteremia, and bilat PEs. Underwent C 3-7 ACDF 3/16.    OT comments  Pt progressing in therapy, demonstrating improved standing tolerance this date. Pt able to recall and verbalize cervical precautions, utilizing log roll technique for bed mobility. Pt able to ambulate to bedroom sink with Carley Hammed walker and mod to max assist x 2. Pt required tactile and verbal cues throughout to maintain hand placement on Eva walker, maintain upright posture, and shift feet underneath trunk to improve balance. Difficulty lifting BLEs while ambulating. Buckling of RLE noted throughout. Pt tolerated standing 1 x 9 min at the sink with mod assist x 2 while engaging in grooming/hygiene tasks. Pt noted to have episode of urinary incontinence in standing. Pt able to engage in LB bathing and dressing task while seated requiring max to total assist. Pt assisted back to bedside chair and positioned for comfort. OT will continue to follow acutely. Continue to recommend CIR for additional rehab prior to discharge home.    Follow Up Recommendations  Supervision/Assistance - 24 hour;CIR    Equipment Recommendations  3 in 1 bedside commode    Recommendations for Other Services      Precautions / Restrictions Precautions Precautions: Fall;Cervical;Other (comment)(orthostatic) Precaution Comments: central cord. Monitor BP - orthostatic hypotension Required Braces or Orthoses: Cervical Brace Cervical Brace: Soft collar Restrictions Weight Bearing Restrictions: No       Mobility Bed Mobility Overal bed mobility: Needs Assistance Bed Mobility: Rolling;Sidelying to Sit Rolling: Min guard Sidelying to sit: Min assist;Mod  assist;+2 for physical assistance       General bed mobility comments: Pt able to roll and reach with LUE for rail. RUE sore this date impacting ability to push up through arm into sitting. Pt able to move BLEs off bed requiring assist for trunk to sit upright.   Transfers Overall transfer level: Needs assistance Equipment used: None(Eva walker and gait belt) Transfers: Sit to/from [employer] Transfers Sit to Stand: Mod assist;+2 physical assistance;+2 safety/equipment Stand pivot transfers: Mod assist;+2 physical assistance;+2 safety/equipment       General transfer comment: Pt required tactile and verbal cues throughout to maintain hand placement on Eva walker, maintain upright posture, and shift feet underneath trunk to improve balance. Difficulty lifting BLEs while ambulating. Buckling of RLE noted throughout.    Balance Overall balance assessment: Needs assistance Sitting-balance support: Feet supported Sitting balance-Leahy Scale: Fair       Standing balance-Leahy Scale: Poor Standing balance comment: Requires external support                           ADL either performed or assessed with clinical judgement   ADL Overall ADL's : Needs assistance/impaired     Grooming: Wash/dry face;Wash/dry hands;Supervision/safety;Moderate assistance Grooming Details (indicate cue type and reason): Setup/supervision to complete while seated upright in bed. Pt able to wash hands, face, and use mouthwash while standing at sink requiring mod assist x 2 for standing balance.      Lower Body Bathing: Maximal assistance;Sit to/from stand Lower Body Bathing Details (indicate cue type and reason): Pt incontinent of urine in standing. Pt able to wash tops of thighs in sitting requiring assist to reach rest of legs and peri  area in standing.      Lower Body Dressing: Total assistance;Sitting/lateral leans Lower Body Dressing Details (indicate cue type and reason): To don/doff  socks. Pt unable to complete figure four position at this time.              Functional mobility during ADLs: Moderate assistance;+2 for physical assistance General ADL Comments: Pt tolerated sitting EOB ~5 min with supervision. Pt required mod to max assist x 2 with eva walker to ambulate to bedroom sink. Pt tolerated standing 1 x 9 min with mod assist x 2.      Vision       Perception     Praxis      Cognition Arousal/Alertness: Awake/alert Behavior During Therapy: WFL for tasks assessed/performed Overall Cognitive Status: Within Functional Limits for tasks assessed                                 General Comments: Pt pleasant and motivated to participate in therapy.         Exercises     Shoulder Instructions       General Comments No signs/symptoms of distress.     Pertinent Vitals/ Pain       Pain Assessment: Faces Faces Pain Scale: Hurts a little bit Pain Location: right arm Pain Descriptors / Indicators: Sore Pain Intervention(s): Monitored during session;Repositioned  Home Living                                          Prior Functioning/Environment              Frequency           Progress Toward Goals  OT Goals(current goals can now be found in the care plan section)  Progress towards OT goals: Progressing toward goals  ADL Goals Pt Will Perform Eating: with supervision;sitting;with adaptive utensils Pt Will Perform Grooming: with min assist;sitting Pt Will Perform Upper Body Dressing: with min assist;sitting Pt Will Transfer to Toilet: with min assist;bedside commode Pt/caregiver will Perform Home Exercise Program: With theraputty;With theraband;With written HEP provided;With Supervision;Increased strength;Increased ROM;Both right and left upper extremity Additional ADL Goal #1: Pt will increase to fair dynamic sitting balance in prep for ADL tasks.  Plan Discharge plan remains appropriate     Co-evaluation    PT/OT/SLP Co-Evaluation/Treatment: Yes Reason for Co-Treatment: Complexity of the patient's impairments (multi-system involvement);For patient/therapist safety;To address functional/ADL transfers   OT goals addressed during session: ADL's and self-care;Strengthening/ROM      AM-PAC OT "6 Clicks" Daily Activity     Outcome Measure   Help from another person eating meals?: A Little Help from another person taking care of personal grooming?: A Lot Help from another person toileting, which includes using toliet, bedpan, or urinal?: A Lot Help from another person bathing (including washing, rinsing, drying)?: A Lot Help from another person to put on and taking off regular upper body clothing?: A Lot Help from another person to put on and taking off regular lower body clothing?: Total 6 Click Score: 12    End of Session Equipment Utilized During Treatment: Gait belt;Other (comment)(Eva walker)  OT Visit Diagnosis: Unsteadiness on feet (R26.81);Muscle weakness (generalized) (M62.81)   Activity Tolerance Patient tolerated treatment well   Patient Left in chair;with call bell/phone within reach;with chair alarm set  Nurse Communication Mobility status        Time: 4720-7218 OT Time Calculation (min): 41 min  Charges: OT General Charges $OT Visit: 1 Visit OT Treatments $Self Care/Home Management : 8-22 mins $Therapeutic Activity: 8-22 mins  Peterson Ao OTR/L [phone]   Peterson Ao 01/06/2020, 9:17 AM

## 2020-01-07 ENCOUNTER — Encounter: Payer: Self-pay | Admitting: *Deleted

## 2020-01-07 LAB — CBC
HCT: 30 % — ABNORMAL LOW (ref 36.0–46.0)
Hemoglobin: 9.4 g/dL — ABNORMAL LOW (ref 12.0–15.0)
MCH: 30.1 pg (ref 26.0–34.0)
MCHC: 31.3 g/dL (ref 30.0–36.0)
MCV: 96.2 fL (ref 80.0–100.0)
Platelets: 332 10*3/uL (ref 150–400)
RBC: 3.12 MIL/uL — ABNORMAL LOW (ref 3.87–5.11)
RDW: 13.8 % (ref 11.5–15.5)
WBC: 7.1 10*3/uL (ref 4.0–10.5)
nRBC: 0 % (ref 0.0–0.2)

## 2020-01-07 LAB — HEPARIN LEVEL (UNFRACTIONATED): Heparin Unfractionated: 0.38 IU/mL (ref 0.30–0.70)

## 2020-01-07 MED ORDER — ENSURE ENLIVE PO LIQD
237.0000 mL | Freq: Two times a day (BID) | ORAL | Status: DC
  Administered 2020-01-07 – 2020-01-11 (×7): 237 mL via ORAL

## 2020-01-07 MED ORDER — HEPARIN (PORCINE) 25000 UT/250ML-% IV SOLN
650.0000 [IU]/h | INTRAVENOUS | Status: AC
  Administered 2020-01-07 – 2020-01-08 (×2): 950 [IU]/h via INTRAVENOUS
  Administered 2020-01-11: 650 [IU]/h via INTRAVENOUS
  Filled 2020-01-07 (×4): qty 250

## 2020-01-07 NOTE — Progress Notes (Signed)
ANTICOAGULATION CONSULT NOTE  Pharmacy Consult for heparin Indication: pulmonary embolus  Allergies  Allergen Reactions  . Ampicillin Diarrhea  . Chlorhexidine     Patient Measurements: Height: 5\' 9"  (175.3 cm) Weight: 132 lb 7.9 oz (60.1 kg) IBW/kg (Calculated) : 66.2 Heparin Dosing Weight: 56.5 kg  Vital Signs: Temp: 98.7 F (37.1 C) (03/19 0415) Temp Source: Oral (03/19 0415) BP: 98/83 (03/19 0415) Pulse Rate: 80 (03/19 0415)  Labs: Recent Labs    01/05/20 0302 01/05/20 0302 01/06/20 0402 01/07/20 0414  HGB 11.0*   < > 9.8* 9.4*  HCT 34.6*  --  31.3* 30.0*  PLT 394  --  309 332  HEPARINUNFRC <0.10*  --   --   --   CREATININE 0.75  --  0.70  --    < > = values in this interval not displayed.    Estimated Creatinine Clearance: 78 mL/min (by C-G formula based on SCr of 0.7 mg/dL).   Assessment: 54 year old female to resume heparin 3/19 s/p dental extractions 3/11 and neurosurgery 3/16 for pulmonary embolus   CBC stable    Goal of Therapy:  Heparin level 0.3-0.7 units/ml Monitor platelets by anticoagulation protocol: Yes   Plan:  Heparin to restart at 950 units / hr 8 hour heparin level Daily heparin level, CBC  Thank you 4/16, PharmD  01/07/2020, 9:53 AM

## 2020-01-07 NOTE — Progress Notes (Signed)
ANTICOAGULATION CONSULT NOTE  Pharmacy Consult for Heparin Indication: pulmonary embolus  Allergies  Allergen Reactions  . Ampicillin Diarrhea  . Chlorhexidine     Patient Measurements: Height: 5\' 9"  (175.3 cm) Weight: 132 lb 7.9 oz (60.1 kg) IBW/kg (Calculated) : 66.2 Heparin Dosing Weight: 56.5 kg  Labs: Recent Labs    01/05/20 0302 01/05/20 0302 01/06/20 0402 01/07/20 0414 01/07/20 1831  HGB 11.0*   < > 9.8* 9.4*  --   HCT 34.6*  --  31.3* 30.0*  --   PLT 394  --  309 332  --   HEPARINUNFRC <0.10*  --   --   --  0.38  CREATININE 0.75  --  0.70  --   --    < > = values in this interval not displayed.    Estimated Creatinine Clearance: 78 mL/min (by C-G formula based on SCr of 0.7 mg/dL).  Assessment: 54 year old female to resume heparin on 3/19 for PE (pt is S/P dental extractions on 3/11 and neurosurgery on 3/16).  Heparin level ~6 hrs after restarting heparin infusion at 950 units/hr was 0.38 units/ml, which is within the goal range for this pt. H/H 9.4/30.0, platelets 332. Per RN, no issues with IV or bleeding observed.   Goal of Therapy:  Heparin level 0.3-0.7 units/ml Monitor platelets by anticoagulation protocol: Yes   Plan:  Continue heparin at 950 units/hr Check 6-hr heparin level Monitor daily heparin level, CBC Monitor for signs/symptoms of bleeding F/U transition to oral anticoagulant  06-06-1972, PharmD, BCPS, [facility] Clinical Pharmacist 01/07/2020, 7:04 PM

## 2020-01-07 NOTE — Progress Notes (Signed)
PROGRESS NOTE  Anna Campos ELF:[id number] DOB: 1965-12-20 DOA: 12/18/2019 PCP: Farris Has, MD  Brief History   Anna Campos is a 54 year old female with no significant past medical history presenting with syncope. She has been having generalized fatigue. She has had poor p.o. intake and has not eaten in the last day. She stood up this evening and passed out. Family heard her fall. She did hit her head. She was told she was not unconscious for a long period of time. She was noted by EMS to be hypotensive and she was given a small fluid bolus.  She was admitted to ICU due to syncope, hypovolemic shock.  Due to EKG changes, cardiology was consulted.  Patient was weaned off Levophed and transferred to [facility] on 3/1.  She was found to have Streptococcus bacteremia.  Infectious disease consulted and recommended 2 weeks of ceftriaxone. She has completed this course.  Due to bacteremia, dentistry was also consulted. Patient underwent dental extraction on 3/11.  Patient developed anemia during her hospitalization, GI consulted and patient underwent EGD on 3/8.  EGD demonstrated normal esophagus, erythematous gastric mucosa with 2 non-bleeding ulcers. One with pigmented material. One endoclip was placed. The patient was also found to have erythematous duodenopathy. Recommendation is for protonix 40 mg bid x 2 months.   The patient has had bilateral extremity weakness with incomplete spinal cord injury C3-C6 documented on MRI. Neurosurgery was consulted. Weakness of distal extremities worse than proximal. The patient underwent C3-4,C4-5, C5-6, C6-7 anterior surgical decompression and fusion with cages and plate on 5/85/2778. She has tolerated the procedure well and is working with PT/OT. Recommendation is for CIR.  Heparin gtt as restarted on 01/07/2020.   Plan is for the patient to discharge to CIR when cleared for discharge by neurosurgery and bed is available.  Consultants   . Gastroenterology . Dentistry . Infectious Disease . Neurosurgery . Cardiology  Procedures  . EGD C3-4,C4-5, C5-6, C6-7 anterior surgical decompression and fusion with cages and plate on 2/42/3536  Antibiotics   Anti-infectives (From admission, onward)   Start     Dose/Rate Route Frequency Ordered Stop   01/04/20 1930  ceFAZolin (ANCEF) IVPB 1 g/50 mL premix     1 g 100 mL/hr over 30 Minutes Intravenous Every 8 hours 01/04/20 1817 01/05/20 0501   01/04/20 1252  bacitracin 50,000 Units in sodium chloride 0.9 % 500 mL irrigation  Status:  Discontinued       As needed 01/04/20 1252 01/04/20 1511   01/04/20 1000  vancomycin (VANCOCIN) IVPB 1000 mg/200 mL premix     1,000 mg 200 mL/hr over 60 Minutes Intravenous To [facility]Stay Surgical 01/03/20 1030 01/04/20 1127   12/30/19 1215  amoxicillin (AMOXIL) capsule 1,000 mg     1,000 mg Oral Every 12 hours 12/30/19 1213 01/13/20 0959   12/30/19 1215  clarithromycin (BIAXIN) tablet 500 mg     500 mg Oral Every 12 hours 12/30/19 1213 01/13/20 0959   12/30/19 0800  cefTRIAXone (ROCEPHIN) 2 g in sodium chloride 0.9 % 100 mL IVPB  Status:  Discontinued     2 g 200 mL/hr over 30 Minutes Intravenous To Surgery 12/30/19 0748 12/30/19 1056   12/21/19 1000  cefTRIAXone (ROCEPHIN) 2 g in sodium chloride 0.9 % 100 mL IVPB     2 g 200 mL/hr over 30 Minutes Intravenous Every 24 hours 12/21/19 0948 01/02/20 2056   12/20/19 2130  vancomycin (VANCOREADY) IVPB 750 mg/150 mL  Status:  Discontinued  750 mg 150 mL/hr over 60 Minutes Intravenous Every 12 hours 12/20/19 1825 12/21/19 0948   12/20/19 1530  ceFAZolin (ANCEF) IVPB 2g/100 mL premix  Status:  Discontinued     2 g 200 mL/hr over 30 Minutes Intravenous Every 8 hours 12/20/19 1526 12/20/19 1814   12/20/19 1000  vancomycin (VANCOREADY) IVPB 750 mg/150 mL  Status:  Discontinued     750 mg 150 mL/hr over 60 Minutes Intravenous Every 12 hours 12/20/19 0054 12/20/19 1019   12/20/19 0130  vancomycin  (VANCOREADY) IVPB 1250 mg/250 mL     1,250 mg 166.7 mL/hr over 90 Minutes Intravenous  Once 12/20/19 0054 12/20/19 0356     Subjective  The patient is sitting up in bed. She is somnolent, but rouseable.   Objective   Vitals:  Vitals:   01/07/20 0009 01/07/20 0415  BP: (!) 92/58 98/83  Pulse: 77 80  Resp: 17   Temp: 99.1 F (37.3 C) 98.7 F (37.1 C)  SpO2: 99% 99%   Exam:  Constitutional:  The patient is awake, alert, and oriented x 3. She continues to complain of "soreness". Respiratory:  . No increased work of breathing. . No wheezes, rales, or rhonchi . No tactile fremitus Cardiovascular:  . Regular rate and rhythm . No murmurs, ectopy, or gallups. . No lateral PMI. No thrills. Abdomen:  . Abdomen is soft, non-tender, non-distended . No hernias, masses, or organomegaly . Normoactive bowel sounds.  Musculoskeletal:  . No cyanosis, clubbing, or edema Skin:  . No rashes, lesions, ulcers . palpation of skin: no induration or nodules Neurologic:  . CN 2-12 intact . Sensation all 4 extremities intact Psychiatric:  . Mental status o Mood, affect appropriate o Orientation to person, place, time  . judgment and insight appear intact  I have personally reviewed the following:   Today's Data  . Vitals, CBC  Micro Data  . Blood culture 12/18/2019 Positive for Streptococcus constellatus.  Imaging  . MRI C-spine  Scheduled Meds: . amoxicillin  1,000 mg Oral Q12H   And  . clarithromycin  500 mg Oral Q12H   And  . pantoprazole  40 mg Oral BID AC  . chlorhexidine  15 mL Mouth/Throat BID  . feeding supplement (ENSURE ENLIVE)  237 mL Oral BID BM  . loratadine  10 mg Oral Daily  . multivitamin with minerals  1 tablet Oral Daily  . sodium chloride flush  3 mL Intravenous Q12H   Continuous Infusions: . sodium chloride Stopped (12/31/19 2240)  . sodium chloride    . heparin    . lactated ringers Stopped (01/05/20 0600)  . sodium chloride irrigation       Active Problems:   Syncope   Quadriplegia (HCC)   Hypotension   Bacteremia   Poor dentition   Spinal stenosis of cervical region   Malnutrition of moderate degree   LOS: 19 days   A & P  Hypovolemic shock and syncope: Resolved. Admitted to ICU and now weaned off of Levophed.  Cortisol 23.5. Appreciate cardiology; EKG shows nonspecific findings and pretest probability for CAD is low.  Troponin negative. Echocardiogram demonstrates normal LVEF, normal RV size and function without valvular abnormalities. There is no sign of right heart strain. No further cardiac work-up recommended.  Strep constellatus bacteremia:ID consulted. Recommendation was for Rocephin for 2 weeks. The patient has completed this course. Repeat blood cultures obtained on 3/1 have had no growth.  Dental caries: CT maxillofacial on 12/18/2019 without mention of any dental  caries or abscess. Orthopantogram on 12/24/2019 showed numerous dental caries. Mild lucency around many of the remaining lower teeth. Dentristy was consulted. The patient underwent dental extraction 3/11 with Dr. Robin Searing. Follow-up with primary dentist of her choice for fabrication of upper and lower complete dentures after adequate healing. Follow-up with dental medicine for evaluation of healing and suture removal once discharged from this admission.   Pulmonary Embolus: CTA chest performed on 12/20/2019 demonstrated a small nonocclusive pulmonary emboli bilaterally involving segmental and subsegmental sized vessels. Doppler of lower extremities was negative for DVT.  Neurosurgery has cleared the patient to initiate heparin on 01/07/2020. Plan to transition to NOAC prior to discharge.  Bilateral extremity weakness with incomplete spinal cord injury C3-C6:Distal lower extremity weaker compared to proximal.  Pt underwent C3-4,C4-5, C5-6, C6-7 anterior surgical decompression and fusion with cages and plate on 0/62/3762. Plan is for the patient to discharge  to CIR when cleared for discharge by neurosurgery.  Acute normocytic anemia/GI bleed/acute blood loss anemia: During her inpatient stay, the patient's hemoglobin dropped abruptly to 6.8 from 9-10 on admission. The patient received 2 units of PRBC's in transfusion on 12/25/2019. GI was consulted. The patient underwent EGD on 12/27/2019 which milderythematous mucosa in the entire gastric cavity. 2 superficial prepyloric ulcers 1 with clean base the other with pigmentation. 1 Endo Clip was deployed to close the mucosal defect. Erythematous duodenal bulb. PPI BIDrecommended for 2 months.Okay to restart IV heparin. H. pylori serology positive, start triple therapy clarithromycin/amoxcillin/PPI for 14 days starting 3/11. Hemoglobin stable. Continue to monitor.  I have seen and examined this patient myself. I have spent 34 minutes in her evaluation and care.  DVT prophylaxis: IV heparin Code Status: Full code Family Communication: No family at bedside Disposition Plan:   Patient is from home prior to admission.  Currently in-hospital treatment needed due to neurosurgery performedon 01/04/2020.   Suspect patient will discharge CIR postoperatively when cleared for discharge by neurosurgery .  Anna Odwyer, DO [facility] Direct contact: see [email or web address]  7PM-7AM contact night coverage as above 01/07/2020, 1:17 PM  LOS: 17 days

## 2020-01-07 NOTE — Progress Notes (Signed)
   Providing Compassionate, Quality Care - Together   Subjective: Patient reports no issues overnight. Worked with physical therapy this morning.  Objective: Vital signs in last 24 hours: Temp:  [98.4 F (36.9 C)-99.4 F (37.4 C)] 98.7 F (37.1 C) (03/19 0415) Pulse Rate:  [77-92] 80 (03/19 0415) Resp:  [16-18] 17 (03/19 0009) BP: (92-102)/(58-83) 98/83 (03/19 0415) SpO2:  [98 %-100 %] 99 % (03/19 0415) Weight:  [60.1 kg] 60.1 kg (03/19 0500)  Intake/Output from previous day: 03/18 0701 - 03/19 0700 In: 680 [P.O.:680] Out: 800 [Urine:800] Intake/Output this shift: No intake/output data recorded.  Alert and oriented x 4 PERRLA CN II-XII grossly inact 3/5 strength in right deltoid, 4/5 in left deltoid, 4-/5 strength in bilateral biceps 2/5 grip strength bilaterally 4-/5 left hip flexor, 3/5 right hip flexor Incision is covered with Honeycomb dressing and Steri Strips; Dressing is clean, dry, and intact; Patient in soft collar   Lab Results: Recent Labs    01/06/20 0402 01/07/20 0414  WBC 11.6* 7.1  HGB 9.8* 9.4*  HCT 31.3* 30.0*  PLT 309 332   BMET Recent Labs    01/05/20 0302 01/06/20 0402  NA 132* 136  K 5.0 4.6  CL 99 99  CO2 21* 28  GLUCOSE 136* 117*  BUN 18 13  CREATININE 0.75 0.70  CALCIUM 9.2 8.9    Studies/Results: No results found.  Assessment/Plan: Mrs. Umble had a syncopal episode at home followed by profound weakness. She was admitted withstreptococcalbacteremia and a pulmonary embolism. MRI scan of the patient's c-spine revealed significant stenosis. Mrs. Blomquist a significant incomplete spinal cord injury secondary to her fall superimposed upon significant degenerative disease with stenosis most prominently at the C4-5 level but also significantly at the C3-4 and C5-6 levels.EGD performed by Dr. Marca Ancona on 12/27/2019 revealed 2 superficial prepyloric ulcers. Dental extractions performed on 12/30/2019 by Dr. Kristin Bruins.She underwent a  C3-4, C4-5, C5-6, and C6-7 anterior cervical decompression and fusion by Dr. Jordan Likes on 01/04/2020.   LOS: 19 days    -Heparin gtt to be restarted today -Continue to mobilize with therapies   Val Eagle, DNP, AGNP-C Nurse Practitioner  [facility] [address], [place], [place] [address] P: [phone]    F: [phone]  01/07/2020, 11:13 AM

## 2020-01-07 NOTE — Progress Notes (Signed)
Inpatient Rehabilitation-Admissions Coordinator   Insurance has approved pt's request for CIR. I do not have a bed available today or this weekend for this patient. Will follow up Monday to see if a bed becomes available.   Cheri Rous, OTR/L  Rehab Admissions Coordinator  [phone] 01/07/2020 4:31 PM

## 2020-01-07 NOTE — Progress Notes (Addendum)
Physical Therapy Treatment Patient Details Name: Anna Campos MRN: [id number] DOB: Jul 23, 1966 Today's Date: 01/07/2020    History of Present Illness 54 year old woman with no significant past medical history presenting with syncope and s/p fall. Pt found to have incomplete spincal cord injury, bacteremia, and bilat PEs. Underwent C 3-7 ACDF 3/16.     PT Comments    Pt more limited by right shoulder pain this session; exacerbated with movement. Pt still agreeable to modified treatment session. Initiated session with right shoulder soft tissue mobilization, pt with noted tightness in upper trapezius. Able to perform hip bridging with good glute activation (BLE's stabilized in hooklying position) and postural re-education exercises sitting edge of bed. Requiring moderate assist for bed mobility and transfers to standing. Further gait deferred by pt this session due to pain. Remains highly appropriate for CIR to address deficits and maximize functional mobility.    Follow Up Recommendations  CIR     Equipment Recommendations  Wheelchair (measurements PT);Wheelchair cushion (measurements PT)    Recommendations for Other Services       Precautions / Restrictions Precautions Precautions: Fall;Cervical Precaution Comments: central cord. Monitor BP - orthostatic hypotension Required Braces or Orthoses: Cervical Brace Cervical Brace: Soft collar Restrictions Weight Bearing Restrictions: No    Mobility  Bed Mobility Overal bed mobility: Needs Assistance Bed Mobility: Rolling;Sidelying to Sit;Sit to Sidelying Rolling: Min guard Sidelying to sit: Mod assist     Sit to sidelying: Mod assist General bed mobility comments: Pt able to roll and reach with LUE for rail. Pt able to move BLEs off bed requiring mod assist for trunk to sit upright. ModA for BLE elevation back into bed.   Transfers Overall transfer level: Needs assistance Equipment used: None(Eva walker) Transfers: Sit to/from  Stand Sit to Stand: Mod assist;+2 safety/equipment         General transfer comment: ModA to boost up from edge of bed. Pt able to self cue for foot positioning shoulder width apart. One trial with face to face transfer and one with Fara Boros.   Ambulation/Gait             General Gait Details: Pt deferring this date   Stairs             Wheelchair Mobility    Modified Rankin (Stroke Patients Only)       Balance Overall balance assessment: Needs assistance Sitting-balance support: Feet supported Sitting balance-Leahy Scale: Fair       Standing balance-Leahy Scale: Poor Standing balance comment: Requires external support                            Cognition Arousal/Alertness: Awake/alert Behavior During Therapy: WFL for tasks assessed/performed Overall Cognitive Status: Within Functional Limits for tasks assessed                                        Exercises Other Exercises Other Exercises: Supine bridging x 10  Other Exercises: Sitting EOB: scapular retractions, cervical retractions, backwards shoulder rolls x 10 each Other Exercises: R shoulder soft tissue mobilization    General Comments        Pertinent Vitals/Pain Pain Assessment: Faces Faces Pain Scale: Hurts whole lot Pain Location: R shoulder/arm Pain Descriptors / Indicators: Sore Pain Intervention(s): Limited activity within patient's tolerance;Monitored during session;Premedicated before session;Repositioned;Heat applied    Home Living  Prior Function            PT Goals (current goals can now be found in the care plan section) Acute Rehab PT Goals Patient Stated Goal: to get better and be independent Potential to Achieve Goals: Good Progress towards PT goals: Progressing toward goals    Frequency    Min 5X/week      PT Plan Current plan remains appropriate    Co-evaluation              AM-PAC PT "6  Clicks" Mobility   Outcome Measure  Help needed turning from your back to your side while in a flat bed without using bedrails?: A Little Help needed moving from lying on your back to sitting on the side of a flat bed without using bedrails?: A Lot Help needed moving to and from a bed to a chair (including a wheelchair)?: A Lot Help needed standing up from a chair using your arms (e.g., wheelchair or bedside chair)?: A Lot Help needed to walk in hospital room?: A Lot Help needed climbing 3-5 steps with a railing? : Total 6 Click Score: 12    End of Session Equipment Utilized During Treatment: Gait belt;Cervical collar Activity Tolerance: Patient limited by pain Patient left: in bed;with call bell/phone within reach Nurse Communication: Mobility status PT Visit Diagnosis: Unsteadiness on feet (R26.81);Other abnormalities of gait and mobility (R26.89);Repeated falls (R29.6);Muscle weakness (generalized) (M62.81);History of falling (Z91.81);Ataxic gait (R26.0);Difficulty in walking, not elsewhere classified (R26.2)     Time: 3662-9476 PT Time Calculation (min) (ACUTE ONLY): 44 min  Charges:  $Therapeutic Exercise: 8-22 mins $Therapeutic Activity: 23-37 mins                       Wyona Almas, PT, DPT Acute Rehabilitation Services Pager [phone] Office [phone]    Deno Etienne 01/07/2020, 12:31 PM

## 2020-01-07 NOTE — Progress Notes (Signed)
Occupational Therapy Treatment Patient Details Name: Anna Campos MRN: [id number] DOB: Jan 08, 1966 Today's Date: 01/07/2020    History of present illness 54 year old woman with no significant past medical history presenting with syncope and s/p fall. Pt found to have incomplete spincal cord injury, bacteremia, and bilat PEs. Underwent C 3-7 ACDF 3/16.    OT comments  Patient continues to make steady progress towards goals in skilled OT session. Patient's session encompassed bed level fine motor coordination, ADLs, and bed mobility due to increased pain in RUE. Pt provided re-iteration of education in use of hands (bumping and tenodesis grasp) in order to complete grooming and upper body dressing. Pt completed 5 bouts of unsupported sitting in long sitting (torso off bed) to further facilitate trunk control. Pt able to hold position for upwards of 15 seconds, but noted to fatigue quickly. Patient continues to make strong gains in therapy, and remains an excellent candidate for CIR in order to promote increased independence; will continue to follow acutely.    Follow Up Recommendations  Supervision/Assistance - 24 hour;CIR    Equipment Recommendations  3 in 1 bedside commode    Recommendations for Other Services      Precautions / Restrictions Precautions Precautions: Fall;Cervical Precaution Comments: central cord. Monitor BP - orthostatic hypotension Required Braces or Orthoses: Cervical Brace Cervical Brace: Soft collar Restrictions Weight Bearing Restrictions: No       Mobility Bed Mobility Overal bed mobility: Needs Assistance Bed Mobility: Rolling Rolling: Min guard Sidelying to sit: Mod assist     Sit to sidelying: Mod assist General bed mobility comments: Pt electing to complete bed level mobility (bridging, sitting upright in long sitting to promote increased trunk control) due to increased pain in session pt requiring min-mod A in order to complete as pt  fatigued  Transfers Overall transfer level: Needs assistance Equipment used: None(Eva walker) Transfers: Sit to/from Stand Sit to Stand: Mod assist;+2 safety/equipment         General transfer comment: ModA to boost up from edge of bed. Pt able to self cue for foot positioning shoulder width apart. One trial with face to face transfer and one with Fara Boros.     Balance Overall balance assessment: Needs assistance Sitting-balance support: Feet supported Sitting balance-Leahy Scale: Fair       Standing balance-Leahy Scale: Poor Standing balance comment: Requires external support                           ADL either performed or assessed with clinical judgement   ADL Overall ADL's : Needs assistance/impaired     Grooming: Applying deodorant;Wash/dry hands;Bed level;Moderate assistance Grooming Details (indicate cue type and reason): Bed level ADLs completed due to increased pain in RUE (shoulder) mod A and education to appropriately position hands to increase grasp (tenodesis)         Upper Body Dressing : Moderate assistance;Bed level;Sitting Upper Body Dressing Details (indicate cue type and reason): donning gown                 Functional mobility during ADLs: Moderate assistance;+2 for physical assistance General ADL Comments: Pt completed bed level ADLs and trunk control in long sitting in session due to increased pain and lethargy in session     Vision Baseline Vision/History: No visual deficits Vision Assessment?: No apparent visual deficits   Perception     Praxis      Cognition Arousal/Alertness: Awake/alert Behavior During Therapy: WFL for tasks  assessed/performed Overall Cognitive Status: Within Functional Limits for tasks assessed                                 General Comments: Pt continues to be motivated in therapy despite pain        Exercises Exercises: Other exercises Other Exercises Other Exercises:  Supine bridging x 10  Other Exercises: Sitting EOB: scapular retractions, cervical retractions, backwards shoulder rolls x 10 each Other Exercises: R shoulder soft tissue mobilization   Shoulder Instructions       General Comments      Pertinent Vitals/ Pain       Pain Assessment: Faces Faces Pain Scale: Hurts even more Pain Location: R shoulder/arm Pain Descriptors / Indicators: Sore Pain Intervention(s): Limited activity within patient's tolerance;Premedicated before session;Monitored during session;Repositioned  Home Living                                          Prior Functioning/Environment              Frequency  Min 3X/week        Progress Toward Goals  OT Goals(current goals can now be found in the care plan section)  Progress towards OT goals: Progressing toward goals  Acute Rehab OT Goals Patient Stated Goal: to get better and be independent OT Goal Formulation: With patient Time For Goal Achievement: 01/19/20 Potential to Achieve Goals: Good  Plan Discharge plan remains appropriate    Co-evaluation                 AM-PAC OT "6 Clicks" Daily Activity     Outcome Measure   Help from another person eating meals?: A Little Help from another person taking care of personal grooming?: A Lot Help from another person toileting, which includes using toliet, bedpan, or urinal?: A Lot Help from another person bathing (including washing, rinsing, drying)?: A Lot Help from another person to put on and taking off regular upper body clothing?: A Lot Help from another person to put on and taking off regular lower body clothing?: Total 6 Click Score: 12    End of Session    OT Visit Diagnosis: Unsteadiness on feet (R26.81);Muscle weakness (generalized) (M62.81)   Activity Tolerance Patient tolerated treatment well;Patient limited by pain   Patient Left in bed;with call bell/phone within reach;with bed alarm set   Nurse  Communication Mobility status        Time: 9518-8416 OT Time Calculation (min): 32 min  Charges: OT Treatments $Self Care/Home Management : 23-37 mins  Corinne Ports E. Knute Mazzuca, COTA/L Acute Rehabilitation Services [place] 01/07/2020, 2:34 PM

## 2020-01-07 NOTE — Progress Notes (Signed)
Nutrition Follow-up  DOCUMENTATION CODES:   Non-severe (moderate) malnutrition in context of social or environmental circumstances, Underweight  INTERVENTION:  Continue Ensure Enlive po BID, each supplement provides 350 kcal and 20 grams of protein  Continue MVI daily   NUTRITION DIAGNOSIS:   Moderate Malnutrition related to social / environmental circumstances as evidenced by mild fat depletion, moderate fat depletion, mild muscle depletion, moderate muscle depletion, energy intake < or equal to 75% for > or equal to 1 month.  Ongoing.  GOAL:   Patient will meet greater than or equal to 90% of their needs  Progressing.   MONITOR:   PO intake, Supplement acceptance, Diet advancement, Labs, Weight trends, Skin, I & O's  REASON FOR ASSESSMENT:   Consult Assessment of nutrition requirement/status  ASSESSMENT:   54 year old woman with no significant past medical history presenting with syncope.  She has been having generalized fatigue.  She has had poor p.o. intake and has not eaten in the last day.  She stood up this evening and passed out.  Family heard her fall.  She did hit her head.  She was told she was not unconscious for a long period of time.  She was noted by EMS to be hypotensive and she was given a small fluid bolus.  Presently she complains of feeling pain all over her body.  She admits to using marijuana today.  She does not take any other medications other than a multivitamin.  She lives with her husband.  She denies that he takes any medications either.  She does not think that she could've inadvertently taken any antihypertensives.  She did recently have tooth pain and had taken some Tylenol with codeine several days ago.  None since.  3/8- s/p EGD- revealed diffuse mildly erythematous mucosa without bleeding founding int gastric body, gastric antrum, pre-pyloric region, and pylorus; two non-bleeding superficial ulcers found in pre-pyloric region of stomach-  hemostasisclip placed 3/11- s/pOPERATIONS: 1. Multiple extraction of tooth numbers2-8, 12-14,18,and 20 through 28. 2.4Quadrants of alveoloplasty 3.Bilateral mandibular lingual tori reductions 3/13- advanced to full liquid diet 3/14- advanced to soft diet 3/15 - diet downgraded to [facility] 3/16 - s/p C3-4,C4-5, C5-6, C6-7 anterior surgical decompression and fusion with cages and plate; diet advanced to regular  Discussed pt with RN.   Per MD, plan is for pt to discharge to CIR.   Pt reports appetite is good and that she is enjoying Ensure.  Admit wt: 59.6 kg Current wt: 60.1kg  PO Intake: 0-100& x last 7 recorded meals (57% average intake)  Medications reviewed and include: Ensure Enlive BID, MVI Labs reviewed.  UOP: 816ml x24 hours I/O: -4585.68ml since admit  Diet Order:   Diet Order            Diet regular Room service appropriate? Yes; Fluid consistency: Thin  Diet effective now              EDUCATION NEEDS:   Education needs have been addressed  Skin:  Skin Assessment: Skin Integrity Issues: Skin Integrity Issues:: Incisions Incisions: neck  Last BM:  3/17  Height:   Ht Readings from Last 1 Encounters:  12/19/19 5\' 9"  (1.753 m)    Weight:   Wt Readings from Last 1 Encounters:  01/07/20 60.1 kg    Ideal Body Weight:  57 kg(adjusted for quadriplegia)  BMI:  Body mass index is 19.57 kg/m.  Estimated Nutritional Needs:   Kcal:  1750-1950  Protein:  100-115 grams  Fluid:  > 1.7  L    Eugene Gavia, MS, RD, LDN RD pager number and weekend/on-call pager number located in [place].

## 2020-01-08 LAB — CBC
HCT: 31.4 % — ABNORMAL LOW (ref 36.0–46.0)
Hemoglobin: 10 g/dL — ABNORMAL LOW (ref 12.0–15.0)
MCH: 30.5 pg (ref 26.0–34.0)
MCHC: 31.8 g/dL (ref 30.0–36.0)
MCV: 95.7 fL (ref 80.0–100.0)
Platelets: 369 10*3/uL (ref 150–400)
RBC: 3.28 MIL/uL — ABNORMAL LOW (ref 3.87–5.11)
RDW: 13.5 % (ref 11.5–15.5)
WBC: 7.8 10*3/uL (ref 4.0–10.5)
nRBC: 0.4 % — ABNORMAL HIGH (ref 0.0–0.2)

## 2020-01-08 LAB — HEPARIN LEVEL (UNFRACTIONATED): Heparin Unfractionated: 0.44 IU/mL (ref 0.30–0.70)

## 2020-01-08 NOTE — Progress Notes (Signed)
ANTICOAGULATION CONSULT NOTE  Pharmacy Consult for Heparin Indication: pulmonary embolus  Allergies  Allergen Reactions  . Ampicillin Diarrhea  . Chlorhexidine     Patient Measurements: Height: 5\' 9"  (175.3 cm) Weight: 132 lb 7.9 oz (60.1 kg) IBW/kg (Calculated) : 66.2 Heparin Dosing Weight: 56.5 kg  Labs: Recent Labs    01/06/20 0402 01/06/20 0402 01/07/20 0414 01/07/20 1831 01/08/20 0337  HGB 9.8*   < > 9.4*  --  10.0*  HCT 31.3*  --  30.0*  --  31.4*  PLT 309  --  332  --  369  HEPARINUNFRC  --   --   --  0.38 0.44  CREATININE 0.70  --   --   --   --    < > = values in this interval not displayed.    Estimated Creatinine Clearance: 78 mL/min (by C-G formula based on SCr of 0.7 mg/dL).  Assessment: 53 YOF on heparin for anticoagulation for PE. Heparin has been on/off for procedures this admit - most recently resumed on 3/19 (pt is S/P dental extractions on 3/11 and neurosurgery on 3/16).  Heparin level this morning remains therapeutic (HL 0.44, goal of 0.3-0.5). CBC stable - no bleeding or issues noted.   Goal of Therapy:  Heparin level 0.3-0.7 units/ml Monitor platelets by anticoagulation protocol: Yes   Plan:  Continue heparin at 950 units/hr Monitor daily heparin level, CBC Monitor for signs/symptoms of bleeding F/U transition to oral anticoagulant  Thank you for allowing pharmacy to be a part of this patient's care.  4/16, PharmD, BCPS Clinical Pharmacist Clinical phone for 01/08/2020: 01/10/2020 01/08/2020 7:42 AM   **Pharmacist phone directory can now be found on [email or web address] ([email or web address]).  Listed under [facility].

## 2020-01-08 NOTE — Progress Notes (Signed)
Physical Therapy Treatment Patient Details Name: Anna Campos MRN: [id number] DOB: 1966-06-05 Today's Date: 01/08/2020    History of Present Illness 54 year old woman with no significant past medical history presenting with syncope and s/p fall. Pt found to have incomplete spincal cord injury, bacteremia, and bilat PEs. Underwent C 3-7 ACDF 3/16.     PT Comments    Pt continues to be motivated to become independent, compliant with exercises and trying to feed herself, in addition to getting OOB every day. Spouse very supportive as well. Pt with increased ambulation tolerance today to 28' with eva walker. Pt continues to have bilat LE increased tone, minimal tricep activation, impaired co-ordination, ataxia, and impaired sequencing. Pt continues to require mod-maxAx2 for oob mobility/ambulation. Pt remains an excellent candidate for CIR upon d/c for maximal functional recovery.    Follow Up Recommendations  CIR     Equipment Recommendations  Wheelchair (measurements PT);Wheelchair cushion (measurements PT)    Recommendations for Other Services Rehab consult     Precautions / Restrictions Precautions Precautions: Fall;Cervical Precaution Comments: central cord. Monitor BP - orthostatic hypotension Required Braces or Orthoses: Cervical Brace Cervical Brace: Soft collar Restrictions Weight Bearing Restrictions: No    Mobility  Bed Mobility               General bed mobility comments: pt up in the chair upon PT arrival  Transfers Overall transfer level: Needs assistance Equipment used: 2 person hand held assist Transfers: Sit to/from Stand Sit to Stand: Mod assist;+2 safety/equipment         General transfer comment: modA to power up, R knee blocked, bilat knees go into hyperextension  Ambulation/Gait Ambulation/Gait assistance: Max assist;+2 physical assistance(spouse to push IV pole) Gait Distance (Feet): 12 Feet Assistive device: (eva walker) Gait  Pattern/deviations: Step-through pattern;Ataxic;Narrow base of support Gait velocity: dec   General Gait Details: pt unable to maintain full upright position, PT stood behind pt providing tactile cues at hips for weight shifting and assist at front of trunk to achieve upright posture. PT tech assist from the front with sequencing of steps, PT also assist with initiation of advancement of R LE   Stairs             Wheelchair Mobility    Modified Rankin (Stroke Patients Only)       Balance Overall balance assessment: Needs assistance         Standing balance support: During functional activity;Bilateral upper extremity supported Standing balance-Leahy Scale: Poor Standing balance comment: Requires external support                            Cognition Arousal/Alertness: Awake/alert Behavior During Therapy: WFL for tasks assessed/performed Overall Cognitive Status: Within Functional Limits for tasks assessed                                 General Comments: pt continues to motivated and compliant      Exercises Other Exercises Other Exercises: passive bilat LE stretching of hips/knees into chest, IR/ER of hips and bilat DF stretch    General Comments General comments (skin integrity, edema, etc.): pt with bilat LE increased tone      Pertinent Vitals/Pain Pain Assessment: Faces Faces Pain Scale: Hurts even more Pain Location: R shoulder/arm Pain Descriptors / Indicators: Sore Pain Intervention(s): Monitored during session    Home Living  Prior Function            PT Goals (current goals can now be found in the care plan section) Progress towards PT goals: Progressing toward goals    Frequency    Min 5X/week      PT Plan Current plan remains appropriate    Co-evaluation              AM-PAC PT "6 Clicks" Mobility   Outcome Measure  Help needed turning from your back to your side while  in a flat bed without using bedrails?: A Little Help needed moving from lying on your back to sitting on the side of a flat bed without using bedrails?: A Lot Help needed moving to and from a bed to a chair (including a wheelchair)?: A Lot Help needed standing up from a chair using your arms (e.g., wheelchair or bedside chair)?: A Lot Help needed to walk in hospital room?: A Lot Help needed climbing 3-5 steps with a railing? : Total 6 Click Score: 12    End of Session Equipment Utilized During Treatment: Gait belt;Cervical collar Activity Tolerance: Patient limited by pain Patient left: in chair;with call bell/phone within reach;with chair alarm set;with family/visitor present Nurse Communication: Mobility status PT Visit Diagnosis: Unsteadiness on feet (R26.81);Other abnormalities of gait and mobility (R26.89);Repeated falls (R29.6);Muscle weakness (generalized) (M62.81);History of falling (Z91.81);Ataxic gait (R26.0);Difficulty in walking, not elsewhere classified (R26.2)     Time: 1962-2297 PT Time Calculation (min) (ACUTE ONLY): 36 min  Charges:  $Gait Training: 8-22 mins $Therapeutic Exercise: 8-22 mins                     Lewis Shock, PT, DPT Acute Rehabilitation Services Pager #: [phone] Office #: [phone]    Anna Campos 01/08/2020, 2:31 PM

## 2020-01-08 NOTE — Progress Notes (Signed)
PROGRESS NOTE  [name] ELF:[id number] DOB: 1965-12-20 DOA: 12/18/2019 PCP: Farris Has, MD  Brief History   Anna Campos is a 54 year old female with no significant past medical history presenting with syncope. She has been having generalized fatigue. She has had poor p.o. intake and has not eaten in the last day. She stood up this evening and passed out. Family heard her fall. She did hit her head. She was told she was not unconscious for a long period of time. She was noted by EMS to be hypotensive and she was given a small fluid bolus.  She was admitted to ICU due to syncope, hypovolemic shock.  Due to EKG changes, cardiology was consulted.  Patient was weaned off Levophed and transferred to [facility] hospital[facility] service on 3/1.  She was found to have Streptococcus bacteremia.  Infectious disease consulted and recommended 2 weeks of ceftriaxone. She has completed this course.  Due to bacteremia, dentistry was also consulted. Patient underwent dental extraction on 3/11.  Patient developed anemia during her hospitalization, GI consulted and patient underwent EGD on 3/8.  EGD demonstrated normal esophagus, erythematous gastric mucosa with 2 non-bleeding ulcers. One with pigmented material. One endoclip was placed. The patient was also found to have erythematous duodenopathy. Recommendation is for protonix 40 mg bid x 2 months.   The patient has had bilateral extremity weakness with incomplete spinal cord injury C3-C6 documented on MRI. Neurosurgery was consulted. Weakness of distal extremities worse than proximal. The patient underwent C3-4,C4-5, C5-6, C6-7 anterior surgical decompression and fusion with cages and plate on 5/85/2778. She has tolerated the procedure well and is working with PT/OT. Recommendation is for CIR.  Heparin gtt as restarted on 01/07/2020.   Plan is for the patient to discharge to CIR when cleared for discharge by neurosurgery and bed is available.  Consultants   . Gastroenterology . Dentistry . Infectious Disease . Neurosurgery . Cardiology  Procedures  . EGD C3-4,C4-5, C5-6, C6-7 anterior surgical decompression and fusion with cages and plate on 2/42/3536  Antibiotics   Anti-infectives (From admission, onward)   Start     Dose/Rate Route Frequency Ordered Stop   01/04/20 1930  ceFAZolin (ANCEF) IVPB 1 g/50 mL premix     1 g 100 mL/hr over 30 Minutes Intravenous Every 8 hours 01/04/20 1817 01/05/20 0501   01/04/20 1252  bacitracin 50,000 Units in sodium chloride 0.9 % 500 mL irrigation  Status:  Discontinued       As needed 01/04/20 1252 01/04/20 1511   01/04/20 1000  vancomycin (VANCOCIN) IVPB 1000 mg/200 mL premix     1,000 mg 200 mL/hr over 60 Minutes Intravenous To ShortStay Surgical 01/03/20 1030 01/04/20 1127   12/30/19 1215  amoxicillin (AMOXIL) capsule 1,000 mg     1,000 mg Oral Every 12 hours 12/30/19 1213 01/13/20 0959   12/30/19 1215  clarithromycin (BIAXIN) tablet 500 mg     500 mg Oral Every 12 hours 12/30/19 1213 01/13/20 0959   12/30/19 0800  cefTRIAXone (ROCEPHIN) 2 g in sodium chloride 0.9 % 100 mL IVPB  Status:  Discontinued     2 g 200 mL/hr over 30 Minutes Intravenous To Surgery 12/30/19 0748 12/30/19 1056   12/21/19 1000  cefTRIAXone (ROCEPHIN) 2 g in sodium chloride 0.9 % 100 mL IVPB     2 g 200 mL/hr over 30 Minutes Intravenous Every 24 hours 12/21/19 0948 01/02/20 2056   12/20/19 2130  vancomycin (VANCOREADY) IVPB 750 mg/150 mL  Status:  Discontinued  750 mg 150 mL/hr over 60 Minutes Intravenous Every 12 hours 12/20/19 1825 12/21/19 0948   12/20/19 1530  ceFAZolin (ANCEF) IVPB 2g/100 mL premix  Status:  Discontinued     2 g 200 mL/hr over 30 Minutes Intravenous Every 8 hours 12/20/19 1526 12/20/19 1814   12/20/19 1000  vancomycin (VANCOREADY) IVPB 750 mg/150 mL  Status:  Discontinued     750 mg 150 mL/hr over 60 Minutes Intravenous Every 12 hours 12/20/19 0054 12/20/19 1019   12/20/19 0130  vancomycin  (VANCOREADY) IVPB 1250 mg/250 mL     1,250 mg 166.7 mL/hr over 90 Minutes Intravenous  Once 12/20/19 0054 12/20/19 0356     Subjective  The patient is sitting up in bed. She is somnolent, but rouseable.   Objective   Vitals:  Vitals:   01/08/20 0744 01/08/20 1140  BP: 91/67 (!) 88/62  Pulse: 86 85  Resp: 19 19  Temp: 98.7 F (37.1 C) 98.8 F (37.1 C)  SpO2: 90% 99%   Exam:  Constitutional:  The patient is awake, alert, and oriented x 3. She continues to complain of "soreness" particularly in her neck. She states that she is regainins sensation in her lower extremities and was able to stand and pivot to a chair earlier today. Respiratory:  . No increased work of breathing. . No wheezes, rales, or rhonchi . No tactile fremitus Cardiovascular:  . Regular rate and rhythm . No murmurs, ectopy, or gallups. . No lateral PMI. No thrills. Abdomen:  . Abdomen is soft, non-tender, non-distended . No hernias, masses, or organomegaly . Normoactive bowel sounds.  Musculoskeletal:  . No cyanosis, clubbing, or edema Skin:  . No rashes, lesions, ulcers . palpation of skin: no induration or nodules Neurologic:  . CN 2-12 intact . Sensation all 4 extremities intact Psychiatric:  . Mental status o Mood, affect appropriate o Orientation to person, place, time  . judgment and insight appear intact  I have personally reviewed the following:   Today's Data  . Vitals, CBC  Micro Data  . Blood culture 12/18/2019 Positive for Streptococcus constellatus.  Imaging  . MRI C-spine  Scheduled Meds: . amoxicillin  1,000 mg Oral Q12H   And  . clarithromycin  500 mg Oral Q12H   And  . pantoprazole  40 mg Oral BID AC  . chlorhexidine  15 mL Mouth/Throat BID  . feeding supplement (ENSURE ENLIVE)  237 mL Oral BID BM  . loratadine  10 mg Oral Daily  . multivitamin with minerals  1 tablet Oral Daily  . sodium chloride flush  3 mL Intravenous Q12H   Continuous Infusions: . sodium  chloride Stopped (12/31/19 2240)  . sodium chloride    . heparin 950 Units/hr (01/07/20 2300)  . lactated ringers Stopped (01/05/20 0600)  . sodium chloride irrigation      Active Problems:   Syncope   Quadriplegia (HCC)   Hypotension   Bacteremia   Poor dentition   Spinal stenosis of cervical region   Malnutrition of moderate degree   LOS: 20 days   A & P  Hypovolemic shock and syncope: Resolved. Admitted to ICU and now weaned off of Levophed.  Cortisol 23.5. Appreciate cardiology; EKG shows nonspecific findings and pretest probability for CAD is low.  Troponin negative. Echocardiogram demonstrates normal LVEF, normal RV size and function without valvular abnormalities. There is no sign of right heart strain. No further cardiac work-up recommended.  Strep constellatus bacteremia:ID consulted. Recommendation was for Rocephin for 2  weeks. The patient has completed this course. Repeat blood cultures obtained on 3/1 have had no growth.  Dental caries: CT maxillofacial on 12/18/2019 without mention of any dental caries or abscess. Orthopantogram on 12/24/2019 showed numerous dental caries. Mild lucency around many of the remaining lower teeth. Dentristy was consulted. The patient underwent dental extraction 3/11 with Dr. Lawana Chambers. Follow-up with primary dentist of her choice for fabrication of upper and lower complete dentures after adequate healing. Follow-up with dental medicine for evaluation of healing and suture removal once discharged from this admission. Heparin has been restarted.  Pulmonary Embolus: CTA chest performed on 12/20/2019 demonstrated a small nonocclusive pulmonary emboli bilaterally involving segmental and subsegmental sized vessels. Doppler of lower extremities was negative for DVT.  Neurosurgery has cleared the patient to initiate heparin on 01/07/2020. Plan to transition to NOAC prior to discharge.  Bilateral extremity weakness with incomplete spinal cord injury  C3-C6:Distal lower extremity weaker compared to proximal.  Pt underwent C3-4,C4-5, C5-6, C6-7 anterior surgical decompression and fusion with cages and plate on 06/13/2352. Plan is for the patient to discharge to CIR when cleared for discharge by neurosurgery. The patient states that she is regaining some sensation and strength in her lower extremities.  Acute normocytic anemia/GI bleed/acute blood loss anemia: During her inpatient stay, the patient's hemoglobin dropped abruptly to 6.8 from 9-10 on admission. The patient received 2 units of PRBC's in transfusion on 12/25/2019. GI was consulted. The patient underwent EGD on 12/27/2019 which milderythematous mucosa in the entire gastric cavity. 2 superficial prepyloric ulcers 1 with clean base the other with pigmentation. 1 Endo Clip was deployed to close the mucosal defect. Erythematous duodenal bulb. PPI BIDrecommended for 2 months.Okay to restart IV heparin. H. pylori serology positive, start triple therapy clarithromycin/amoxcillin/PPI for 14 days starting 3/11. Hemoglobin stable at 10.0 today. Continue to monitor.  I have seen and examined this patient myself. I have spent 32 minutes in her evaluation and care.  DVT prophylaxis: IV heparin Code Status: Full code Family Communication: No family at bedside Disposition Plan:   Patient is from home prior to admission.  Currently in-hospital treatment needed due to neurosurgery performedon 01/04/2020.   Suspect patient will discharge CIR postoperatively when cleared for discharge by neurosurgery .  Keefer Soulliere, DO [facility] Direct contact: see [email or web address]  7PM-7AM contact night coverage as above 01/08/2020, 12:27 PM  LOS: 17 days

## 2020-01-08 NOTE — Progress Notes (Signed)
NEUROSURGERY PROGRESS NOTE  Doing well. Complains of appropriate neck soreness. No numbness, tingling or weakness Ambulating and voiding well Strength in arms is unchanged Incision CDI  Temp:  [98.7 F (37.1 C)-99.3 F (37.4 C)] 98.7 F (37.1 C) (03/20 0744) Pulse Rate:  [82-103] 86 (03/20 0744) Resp:  [16-19] 19 (03/20 0744) BP: (91-99)/(57-70) 91/67 (03/20 0744) SpO2:  [90 %-99 %] 90 % (03/20 0744)  Plan: Continue pain medication and therapies today.   Sherryl Manges, NP 01/08/2020 9:00 AM

## 2020-01-09 LAB — CBC
HCT: 32.7 % — ABNORMAL LOW (ref 36.0–46.0)
Hemoglobin: 10.4 g/dL — ABNORMAL LOW (ref 12.0–15.0)
MCH: 30.6 pg (ref 26.0–34.0)
MCHC: 31.8 g/dL (ref 30.0–36.0)
MCV: 96.2 fL (ref 80.0–100.0)
Platelets: 436 10*3/uL — ABNORMAL HIGH (ref 150–400)
RBC: 3.4 MIL/uL — ABNORMAL LOW (ref 3.87–5.11)
RDW: 13.3 % (ref 11.5–15.5)
WBC: 7.2 10*3/uL (ref 4.0–10.5)
nRBC: 0 % (ref 0.0–0.2)

## 2020-01-09 LAB — HEPARIN LEVEL (UNFRACTIONATED)
Heparin Unfractionated: 0.98 IU/mL — ABNORMAL HIGH (ref 0.30–0.70)
Heparin Unfractionated: 0.72 IU/mL — ABNORMAL HIGH (ref 0.30–0.70)
Heparin Unfractionated: 0.6 IU/mL (ref 0.30–0.70)

## 2020-01-09 NOTE — Progress Notes (Signed)
ANTICOAGULATION CONSULT NOTE  Pharmacy Consult for Heparin Indication: pulmonary embolus  Assessment: 53 YOF on heparin for anticoagulation for PE. Heparin has been on/off for procedures this admit - most recently resumed on 3/19 (pt is S/P dental extractions on 3/11 and neurosurgery on 3/16).  Heparin level this morning 0.98 units/ml  Goal of Therapy:  Heparin level 0.3-0.7 units/ml Monitor platelets by anticoagulation protocol: Yes   Plan:  Decrease heparin to 850 units/hr F/U transition to oral anticoagulant  Thanks for allowing pharmacy to be a part of this patient's care.  Talbert Cage, PharmD Clinical Pharmacist

## 2020-01-09 NOTE — Progress Notes (Addendum)
ANTICOAGULATION CONSULT NOTE  Pharmacy Consult for Heparin Indication: pulmonary embolus  Allergies  Allergen Reactions  . Ampicillin Diarrhea  . Chlorhexidine     Patient Measurements: Height: 5\' 9"  (175.3 cm) Weight: 132 lb 7.9 oz (60.1 kg) IBW/kg (Calculated) : 66.2 Heparin Dosing Weight: 56.5 kg  Labs: Recent Labs    01/07/20 0414 01/07/20 0414 01/07/20 1831 01/08/20 0337 01/09/20 0455  HGB 9.4*   < >  --  10.0* 10.4*  HCT 30.0*  --   --  31.4* 32.7*  PLT 332  --   --  369 436*  HEPARINUNFRC  --   --  0.38 0.44 0.98*   < > = values in this interval not displayed.    Estimated Creatinine Clearance: 78 mL/min (by C-G formula based on SCr of 0.7 mg/dL).  Assessment: 53 YOF on heparin for anticoagulation for PE. Heparin has been on/off for procedures this admit - most recently resumed on 3/19 (pt is S/P dental extractions on 3/11 and neurosurgery on 3/16).  Heparin level this afternoon remains SUPRAtherapeutic after a rate decrease earlier today (0.72 << 0.98, goal 0.3-0.5). Confirmed with the RN that the level was drawn from the opposite arm. Aiming for lower goal due to recent surgeries - will reduce the rate.   Goal of Therapy:  Heparin level 0.3-0.7 units/ml Monitor platelets by anticoagulation protocol: Yes   Plan:  Reduce Heparin to 750 units/hr (7.5 ml/hr) Monitor daily heparin level, CBC Monitor for signs/symptoms of bleeding F/U transition to oral anticoagulant  Thank you for allowing pharmacy to be a part of this patient's care.  4/16, PharmD, BCPS Clinical Pharmacist Clinical phone for 01/09/2020: 01/11/2020 01/09/2020 7:51 AM   **Pharmacist phone directory can now be found on [email or web address] ([email or web address]).  Listed under [facility].

## 2020-01-09 NOTE — Progress Notes (Signed)
ANTICOAGULATION CONSULT NOTE  Pharmacy Consult for Heparin Indication: pulmonary embolus  Allergies  Allergen Reactions  . Ampicillin Diarrhea  . Chlorhexidine     Patient Measurements: Height: 5\' 9"  (175.3 cm) Weight: 121 lb 7.6 oz (55.1 kg) IBW/kg (Calculated) : 66.2 Heparin Dosing Weight: 56.5 kg  Labs: Recent Labs    01/07/20 0414 01/07/20 1831 01/08/20 0337 01/08/20 0337 01/09/20 0455 01/09/20 1230 01/09/20 2012  HGB 9.4*  --  10.0*  --  10.4*  --   --   HCT 30.0*  --  31.4*  --  32.7*  --   --   PLT 332  --  369  --  436*  --   --   HEPARINUNFRC  --    < > 0.44   < > 0.98* 0.72* 0.60   < > = values in this interval not displayed.    Estimated Creatinine Clearance: 71.6 mL/min (by C-G formula based on SCr of 0.7 mg/dL).  Assessment: 53 YOF on heparin for anticoagulation for PE. Heparin has been on/off for procedures this admit - most recently resumed on 3/19 (pt is S/P dental extractions on 3/11 and neurosurgery on 3/16).  Heparin level this afternoon remains SUPRAtherapeutic at 0.6 despite rate reductions. Aiming for lower goal due to recent surgeries - will reduce the rate.   Goal of Therapy:  Heparin level 0.3-0.5 units/ml Monitor platelets by anticoagulation protocol: Yes   Plan:  Reduce heparin gtt to 650 units/hr F/u AM heparin level  4/16, PharmD, BCPS Clinical Pharmacist Please see AMION for all pharmacy numbers 01/09/2020 8:41 PM

## 2020-01-09 NOTE — Progress Notes (Signed)
NEUROSURGERY PROGRESS NOTE  Doing well. Complains of appropriate neck pain. Incision is CDI   Temp:  [98.3 F (36.8 C)-99.1 F (37.3 C)] 98.5 F (36.9 C) (03/21 1205) Pulse Rate:  [78-97] 97 (03/21 1205) Resp:  [16-19] 16 (03/21 1205) BP: (89-102)/(58-68) 91/68 (03/21 1205) SpO2:  [97 %-100 %] 100 % (03/21 1205) Weight:  [55.1 kg] 55.1 kg (03/21 0500)  Plan: Continue therapies. No acute events overnight.   Sherryl Manges, NP 01/09/2020 12:39 PM

## 2020-01-09 NOTE — Progress Notes (Signed)
PROGRESS NOTE  Anna Campos ELF:[id number] DOB: 1965-12-20 DOA: 12/18/2019 PCP: Farris Has, MD  Brief History   Anna Campos is a 54 year old female with no significant past medical history presenting with syncope. She has been having generalized fatigue. She has had poor p.o. intake and has not eaten in the last day. She stood up this evening and passed out. Family heard her fall. She did hit her head. She was told she was not unconscious for a long period of time. She was noted by EMS to be hypotensive and she was given a small fluid bolus.  She was admitted to ICU due to syncope, hypovolemic shock.  Due to EKG changes, cardiology was consulted.  Patient was weaned off Levophed and transferred to [facility] on 3/1.  She was found to have Streptococcus bacteremia.  Infectious disease consulted and recommended 2 weeks of ceftriaxone. She has completed this course.  Due to bacteremia, dentistry was also consulted. Patient underwent dental extraction on 3/11.  Patient developed anemia during her hospitalization, GI consulted and patient underwent EGD on 3/8.  EGD demonstrated normal esophagus, erythematous gastric mucosa with 2 non-bleeding ulcers. One with pigmented material. One endoclip was placed. The patient was also found to have erythematous duodenopathy. Recommendation is for protonix 40 mg bid x 2 months.   The patient has had bilateral extremity weakness with incomplete spinal cord injury C3-C6 documented on MRI. Neurosurgery was consulted. Weakness of distal extremities worse than proximal. The patient underwent C3-4,C4-5, C5-6, C6-7 anterior surgical decompression and fusion with cages and plate on 5/85/2778. She has tolerated the procedure well and is working with PT/OT. Recommendation is for CIR.  Heparin gtt as restarted on 01/07/2020.   Plan is for the patient to discharge to CIR when cleared for discharge by neurosurgery and bed is available.  Consultants   . Gastroenterology . Dentistry . Infectious Disease . Neurosurgery . Cardiology  Procedures  . EGD C3-4,C4-5, C5-6, C6-7 anterior surgical decompression and fusion with cages and plate on 2/42/3536  Antibiotics   Anti-infectives (From admission, onward)   Start     Dose/Rate Route Frequency Ordered Stop   01/04/20 1930  ceFAZolin (ANCEF) IVPB 1 g/50 mL premix     1 g 100 mL/hr over 30 Minutes Intravenous Every 8 hours 01/04/20 1817 01/05/20 0501   01/04/20 1252  bacitracin 50,000 Units in sodium chloride 0.9 % 500 mL irrigation  Status:  Discontinued       As needed 01/04/20 1252 01/04/20 1511   01/04/20 1000  vancomycin (VANCOCIN) IVPB 1000 mg/200 mL premix     1,000 mg 200 mL/hr over 60 Minutes Intravenous To [facility]Stay Surgical 01/03/20 1030 01/04/20 1127   12/30/19 1215  amoxicillin (AMOXIL) capsule 1,000 mg     1,000 mg Oral Every 12 hours 12/30/19 1213 01/13/20 0959   12/30/19 1215  clarithromycin (BIAXIN) tablet 500 mg     500 mg Oral Every 12 hours 12/30/19 1213 01/13/20 0959   12/30/19 0800  cefTRIAXone (ROCEPHIN) 2 g in sodium chloride 0.9 % 100 mL IVPB  Status:  Discontinued     2 g 200 mL/hr over 30 Minutes Intravenous To Surgery 12/30/19 0748 12/30/19 1056   12/21/19 1000  cefTRIAXone (ROCEPHIN) 2 g in sodium chloride 0.9 % 100 mL IVPB     2 g 200 mL/hr over 30 Minutes Intravenous Every 24 hours 12/21/19 0948 01/02/20 2056   12/20/19 2130  vancomycin (VANCOREADY) IVPB 750 mg/150 mL  Status:  Discontinued  750 mg 150 mL/hr over 60 Minutes Intravenous Every 12 hours 12/20/19 1825 12/21/19 0948   12/20/19 1530  ceFAZolin (ANCEF) IVPB 2g/100 mL premix  Status:  Discontinued     2 g 200 mL/hr over 30 Minutes Intravenous Every 8 hours 12/20/19 1526 12/20/19 1814   12/20/19 1000  vancomycin (VANCOREADY) IVPB 750 mg/150 mL  Status:  Discontinued     750 mg 150 mL/hr over 60 Minutes Intravenous Every 12 hours 12/20/19 0054 12/20/19 1019   12/20/19 0130  vancomycin  (VANCOREADY) IVPB 1250 mg/250 mL     1,250 mg 166.7 mL/hr over 90 Minutes Intravenous  Once 12/20/19 0054 12/20/19 0356     Subjective  The patient is sitting up in bed. She is awake and alert. She feels that she is getting stronger and tolerating ambulation better.  Objective   Vitals:  Vitals:   01/09/20 0730 01/09/20 1205  BP: 91/67 91/68  Pulse: 78 97  Resp: 18 16  Temp: 98.6 F (37 C) 98.5 F (36.9 C)  SpO2: 100% 100%   Exam:  Constitutional:  The patient is awake, alert, and oriented x 3. She continues to complain of "soreness" particularly in her neck. Ambulation with PT is improving. Up to 12 feet.  Respiratory:  . No increased work of breathing. . No wheezes, rales, or rhonchi . No tactile fremitus Cardiovascular:  . Regular rate and rhythm . No murmurs, ectopy, or gallups. . No lateral PMI. No thrills. Abdomen:  . Abdomen is soft, non-tender, non-distended . No hernias, masses, or organomegaly . Normoactive bowel sounds.  Musculoskeletal:  . No cyanosis, clubbing, or edema Skin:  . No rashes, lesions, ulcers . palpation of skin: no induration or nodules Neurologic:  . CN 2-12 intact . Sensation all 4 extremities intact Psychiatric:  . Mental status o Mood, affect appropriate o Orientation to person, place, time  . judgment and insight appear intact  I have personally reviewed the following:   Today's Data  . Vitals, CBC  Micro Data  . Blood culture 12/18/2019 Positive for Streptococcus constellatus.  Imaging  . MRI C-spine  Scheduled Meds: . amoxicillin  1,000 mg Oral Q12H   And  . clarithromycin  500 mg Oral Q12H   And  . pantoprazole  40 mg Oral BID AC  . chlorhexidine  15 mL Mouth/Throat BID  . feeding supplement (ENSURE ENLIVE)  237 mL Oral BID BM  . loratadine  10 mg Oral Daily  . multivitamin with minerals  1 tablet Oral Daily  . sodium chloride flush  3 mL Intravenous Q12H   Continuous Infusions: . sodium chloride Stopped  (12/31/19 2240)  . sodium chloride    . heparin 750 Units/hr (01/09/20 1339)  . lactated ringers Stopped (01/05/20 0600)  . sodium chloride irrigation      Active Problems:   Syncope   Quadriplegia (HCC)   Hypotension   Bacteremia   Poor dentition   Spinal stenosis of cervical region   Malnutrition of moderate degree   LOS: 21 days   A & P  Hypovolemic shock and syncope: Resolved. Admitted to ICU and now weaned off of Levophed.  Cortisol 23.5. Appreciate cardiology; EKG shows nonspecific findings and pretest probability for CAD is low.  Troponin negative. Echocardiogram demonstrates normal LVEF, normal RV size and function without valvular abnormalities. There is no sign of right heart strain. No further cardiac work-up recommended.  Strep constellatus bacteremia:ID consulted. Recommendation was for Rocephin for 2 weeks. The patient has  completed this course. Repeat blood cultures obtained on 3/1 have had no growth.  Dental caries: CT maxillofacial on 12/18/2019 without mention of any dental caries or abscess. Orthopantogram on 12/24/2019 showed numerous dental caries. Mild lucency around many of the remaining lower teeth. Dentristy was consulted. The patient underwent dental extraction 3/11 with Dr. Robin Searing. Follow-up with primary dentist of her choice for fabrication of upper and lower complete dentures after adequate healing. Follow-up with dental medicine for evaluation of healing and suture removal once discharged from this admission. Heparin has been restarted.  Pulmonary Embolus: CTA chest performed on 12/20/2019 demonstrated a small nonocclusive pulmonary emboli bilaterally involving segmental and subsegmental sized vessels. Doppler of lower extremities was negative for DVT.  Neurosurgery has cleared the patient to initiate heparin on 01/07/2020. Plan to transition to NOAC prior to discharge.  Bilateral extremity weakness with incomplete spinal cord injury C3-C6:Distal lower  extremity weaker compared to proximal.  Pt underwent C3-4,C4-5, C5-6, C6-7 anterior surgical decompression and fusion with cages and plate on 1/66/0630. Plan is for the patient to discharge to CIR when cleared for discharge by neurosurgery. The patient states that she is regaining some sensation and strength in her lower extremities.  Acute normocytic anemia/GI bleed/acute blood loss anemia: During her inpatient stay, the patient's hemoglobin dropped abruptly to 6.8 from 9-10 on admission. The patient received 2 units of PRBC's in transfusion on 12/25/2019. GI was consulted. The patient underwent EGD on 12/27/2019 which milderythematous mucosa in the entire gastric cavity. 2 superficial prepyloric ulcers 1 with clean base the other with pigmentation. 1 Endo Clip was deployed to close the mucosal defect. Erythematous duodenal bulb. PPI BIDrecommended for 2 months.Okay to restart IV heparin. H. pylori serology positive, start triple therapy clarithromycin/amoxcillin/PPI for 14 days starting 3/11. Hemoglobin stable at 10.4 today. Continue to monitor.  I have seen and examined this patient myself. I have spent 32 minutes in her evaluation and care.  DVT prophylaxis: IV heparin Code Status: Full code Family Communication: No family at bedside Disposition Plan:   Patient is from home prior to admission.  Currently in-hospital treatment needed due to neurosurgery performedon 01/04/2020.   Suspect patient will discharge CIR postoperatively when cleared for discharge by neurosurgery .  Haniah Penny, DO [facility] Direct contact: see [email or web address]  7PM-7AM contact night coverage as above 01/09/2020, 2:26 PM  LOS: 17 days

## 2020-01-10 LAB — CBC
HCT: 34.3 % — ABNORMAL LOW (ref 36.0–46.0)
Hemoglobin: 10.9 g/dL — ABNORMAL LOW (ref 12.0–15.0)
MCH: 30.4 pg (ref 26.0–34.0)
MCHC: 31.8 g/dL (ref 30.0–36.0)
MCV: 95.5 fL (ref 80.0–100.0)
Platelets: 459 10*3/uL — ABNORMAL HIGH (ref 150–400)
RBC: 3.59 MIL/uL — ABNORMAL LOW (ref 3.87–5.11)
RDW: 13.3 % (ref 11.5–15.5)
WBC: 7.1 10*3/uL (ref 4.0–10.5)
nRBC: 0 % (ref 0.0–0.2)

## 2020-01-10 LAB — HEPARIN LEVEL (UNFRACTIONATED): Heparin Unfractionated: 0.38 IU/mL (ref 0.30–0.70)

## 2020-01-10 NOTE — Progress Notes (Signed)
Occupational Therapy Treatment Patient Details Name: Anna Campos MRN: [id number] DOB: 1965-10-25 Today's Date: 01/10/2020    History of present illness 54 year old woman with no significant past medical history presenting with syncope and s/p fall. Pt found to have incomplete spincal cord injury, bacteremia, and bilat PEs. Underwent C 3-7 ACDF 3/16.    OT comments  Pt continues to make progress in therapy, with this session focusing on BUE ROM, strength, and coordination to complete feeding task. Pt up in bedside chair upon OT arrival. Pt initially finger feeding with left hand noting 4+ drops. Pt utilized built up utensil in right hand with gross overhand grasp. Pt loaded utensil with left hand, able to bring to mouth with right only. Noted 3 drops of the utensil with the right hand. Pt utilizes left hand to place and position utensil in right. Pt continues to overcompensate with shld to bring food to mouth. Focused on decreasing shld movement and promoting use of elbow flex/ext for task. Worked on repetition of task with pt requiring mod to max verbal and tactile cues to attempt to use elbow only. Pt required increased time to complete. Pt mod fatigued following. OT will continue to follow acutely. Continue to recommend CIR for additional rehab prior to discharge home.    Follow Up Recommendations  Supervision/Assistance - 24 hour;CIR    Equipment Recommendations  3 in 1 bedside commode    Recommendations for Other Services      Precautions / Restrictions Precautions Precautions: Fall;Cervical Precaution Comments: central cord. Monitor BP - orthostatic hypotension Cervical Brace: Soft collar Restrictions Weight Bearing Restrictions: No       Mobility Bed Mobility               General bed mobility comments: Pt seated in bedside chair upon OT arrival.  Transfers                 General transfer comment: Not attempted this date. Session focused on self-feeding.     Balance                                           ADL either performed or assessed with clinical judgement   ADL Overall ADL's : Needs assistance/impaired Eating/Feeding: Set up;Supervision/ safety;Minimal assistance;Sitting Eating/Feeding Details (indicate cue type and reason): While seated in bedside chair. Pt initially finger feeding with left hand noting 4+ drops. Pt utilized built up utensil in right hand with gross overhand grasp. Pt loaded utensil with left hand, able to bring to mouth with right only. Noted 3 drops of the utensil with the right hand. Pt continues to overcompensate with shld to bring food to mouth. Focused on decreasing shld movement and promoting use of elbow flex/ext for task. Worked on repetition of task with pt requiring mod to max verbal and tactile cues to attempt to use elbow only. Pt mod fatigued following.  Grooming: Wash/dry hands;Wash/dry face;Set up;Supervision/safety;Sitting                                 General ADL Comments: Pt demonstrating increased BUE strength, ROM, and coordination as well as independence during self-feeding task.      Vision       Perception     Praxis      Cognition Arousal/Alertness: Awake/alert Behavior During Therapy: [facility]  for tasks assessed/performed Overall Cognitive Status: Within Functional Limits for tasks assessed                                 General Comments: Pt extremely motivated and willing to participate in therapy.         Exercises     Shoulder Instructions       General Comments No signs/symptoms of distress. Husband in at end of session with education provided regarding external cues to provide pt when self-feeding to decrease shld movement.     Pertinent Vitals/ Pain       Pain Assessment: No/denies pain  Home Living                                          Prior Functioning/Environment              Frequency            Progress Toward Goals  OT Goals(current goals can now be found in the care plan section)  Progress towards OT goals: Progressing toward goals  ADL Goals Pt Will Perform Eating: with supervision;sitting;with adaptive utensils Pt Will Perform Grooming: with min assist;sitting Pt Will Perform Upper Body Dressing: with min assist;sitting Pt Will Transfer to Toilet: with min assist;bedside commode Pt/caregiver will Perform Home Exercise Program: With theraputty;With theraband;With written HEP provided;With Supervision;Increased strength;Increased ROM;Both right and left upper extremity Additional ADL Goal #1: Pt will increase to fair dynamic sitting balance in prep for ADL tasks.  Plan Discharge plan remains appropriate    Co-evaluation                 AM-PAC OT "6 Clicks" Daily Activity     Outcome Measure   Help from another person eating meals?: A Little Help from another person taking care of personal grooming?: A Lot Help from another person toileting, which includes using toliet, bedpan, or urinal?: A Lot Help from another person bathing (including washing, rinsing, drying)?: A Lot Help from another person to put on and taking off regular upper body clothing?: A Lot Help from another person to put on and taking off regular lower body clothing?: Total 6 Click Score: 12    End of Session    OT Visit Diagnosis: Unsteadiness on feet (R26.81);Muscle weakness (generalized) (M62.81)   Activity Tolerance Patient tolerated treatment well   Patient Left in chair;with call bell/phone within reach;with chair alarm set   Nurse Communication Mobility status        Time: 0539-7673 OT Time Calculation (min): 44 min  Charges: OT General Charges $OT Visit: 1 Visit OT Treatments $Self Care/Home Management : 38-52 mins  Mauri Brooklyn OTR/L [phone]    Mauri Brooklyn 01/10/2020, 10:35 AM

## 2020-01-10 NOTE — Progress Notes (Signed)
PROGRESS NOTE  Anna Campos DUK:[id number] DOB: Dec 25, 1965 DOA: 12/18/2019 PCP: Farris Has, MD  Brief History   Anna Campos is a 54 year old female with no significant past medical history presenting with syncope. She has been having generalized fatigue. She has had poor p.o. intake and has not eaten in the last day. She stood up this evening and passed out. Family heard her fall. She did hit her head. She was told she was not unconscious for a long period of time. She was noted by EMS to be hypotensive and she was given a small fluid bolus.  She was admitted to ICU due to syncope, hypovolemic shock.  Due to EKG changes, cardiology was consulted.  Patient was weaned off Levophed and transferred to [facility] on 3/1.  She was found to have Streptococcus bacteremia.  Infectious disease consulted and recommended 2 weeks of ceftriaxone. She has completed this course.  Due to bacteremia, dentistry was also consulted. Patient underwent dental extraction on 3/11.  Patient developed anemia during her hospitalization, GI consulted and patient underwent EGD on 3/8.  EGD demonstrated normal esophagus, erythematous gastric mucosa with 2 non-bleeding ulcers. One with pigmented material. One endoclip was placed. The patient was also found to have erythematous duodenopathy. Recommendation is for protonix 40 mg bid x 2 months.   The patient has had bilateral extremity weakness with incomplete spinal cord injury C3-C6 documented on MRI. Neurosurgery was consulted. Weakness of distal extremities worse than proximal. The patient underwent C3-4,C4-5, C5-6, C6-7 anterior surgical decompression and fusion with cages and plate on 3/76/2831. She has tolerated the procedure well and is working with PT/OT. Recommendation is for CIR.  Heparin gtt was restarted on 01/07/2020.   Plan is for the patient to discharge to CIR when cleared for discharge by neurosurgery and bed is available.  Consultants   . Gastroenterology . Dentistry . Infectious Disease . Neurosurgery . Cardiology  Procedures  . EGD C3-4,C4-5, C5-6, C6-7 anterior surgical decompression and fusion with cages and plate on 03/07/6159  Antibiotics   Anti-infectives (From admission, onward)   Start     Dose/Rate Route Frequency Ordered Stop   01/04/20 1930  ceFAZolin (ANCEF) IVPB 1 g/50 mL premix     1 g 100 mL/hr over 30 Minutes Intravenous Every 8 hours 01/04/20 1817 01/05/20 0501   01/04/20 1252  bacitracin 50,000 Units in sodium chloride 0.9 % 500 mL irrigation  Status:  Discontinued       As needed 01/04/20 1252 01/04/20 1511   01/04/20 1000  vancomycin (VANCOCIN) IVPB 1000 mg/200 mL premix     1,000 mg 200 mL/hr over 60 Minutes Intravenous To ShortStay Surgical 01/03/20 1030 01/04/20 1127   12/30/19 1215  amoxicillin (AMOXIL) capsule 1,000 mg     1,000 mg Oral Every 12 hours 12/30/19 1213 01/13/20 0959   12/30/19 1215  clarithromycin (BIAXIN) tablet 500 mg     500 mg Oral Every 12 hours 12/30/19 1213 01/13/20 0959   12/30/19 0800  cefTRIAXone (ROCEPHIN) 2 g in sodium chloride 0.9 % 100 mL IVPB  Status:  Discontinued     2 g 200 mL/hr over 30 Minutes Intravenous To Surgery 12/30/19 0748 12/30/19 1056   12/21/19 1000  cefTRIAXone (ROCEPHIN) 2 g in sodium chloride 0.9 % 100 mL IVPB     2 g 200 mL/hr over 30 Minutes Intravenous Every 24 hours 12/21/19 0948 01/02/20 2056   12/20/19 2130  vancomycin (VANCOREADY) IVPB 750 mg/150 mL  Status:  Discontinued  750 mg 150 mL/hr over 60 Minutes Intravenous Every 12 hours 12/20/19 1825 12/21/19 0948   12/20/19 1530  ceFAZolin (ANCEF) IVPB 2g/100 mL premix  Status:  Discontinued     2 g 200 mL/hr over 30 Minutes Intravenous Every 8 hours 12/20/19 1526 12/20/19 1814   12/20/19 1000  vancomycin (VANCOREADY) IVPB 750 mg/150 mL  Status:  Discontinued     750 mg 150 mL/hr over 60 Minutes Intravenous Every 12 hours 12/20/19 0054 12/20/19 1019   12/20/19 0130  vancomycin  (VANCOREADY) IVPB 1250 mg/250 mL     1,250 mg 166.7 mL/hr over 90 Minutes Intravenous  Once 12/20/19 0054 12/20/19 0356     Subjective  The patient is sitting up in bed. She is awake and alert. She feels that she is getting stronger and tolerating ambulation better.   Objective   Vitals:  Vitals:   01/10/20 0843 01/10/20 1243  BP: (!) 85/66 90/67  Pulse: 94 92  Resp: 16 18  Temp: 98.2 F (36.8 C) 98.3 F (36.8 C)  SpO2: 100% 100%   Exam:  Constitutional:  The patient is awake, alert, and oriented x 3. No acute distress. Respiratory:  . No increased work of breathing. . No wheezes, rales, or rhonchi . No tactile fremitus Cardiovascular:  . Regular rate and rhythm . No murmurs, ectopy, or gallups. . No lateral PMI. No thrills. Abdomen:  . Abdomen is soft, non-tender, non-distended . No hernias, masses, or organomegaly . Normoactive bowel sounds.  Musculoskeletal:  . No cyanosis, clubbing, or edema Skin:  . No rashes, lesions, ulcers . palpation of skin: no induration or nodules Neurologic:  . CN 2-12 intact . Sensation all 4 extremities intact . Moving lower extremities. Psychiatric:  . Mental status o Mood, affect appropriate o Orientation to person, place, time  . judgment and insight appear intact  I have personally reviewed the following:   Today's Data  . Vitals, CBC  Micro Data  . Blood culture 12/18/2019 Positive for Streptococcus constellatus.  Imaging  . MRI C-spine  Scheduled Meds: . amoxicillin  1,000 mg Oral Q12H   And  . clarithromycin  500 mg Oral Q12H   And  . pantoprazole  40 mg Oral BID AC  . chlorhexidine  15 mL Mouth/Throat BID  . feeding supplement (ENSURE ENLIVE)  237 mL Oral BID BM  . loratadine  10 mg Oral Daily  . multivitamin with minerals  1 tablet Oral Daily  . sodium chloride flush  3 mL Intravenous Q12H   Continuous Infusions: . sodium chloride Stopped (12/31/19 2240)  . sodium chloride    . heparin 650 Units/hr  (01/09/20 2103)  . lactated ringers Stopped (01/05/20 0600)  . sodium chloride irrigation      Active Problems:   Syncope   Quadriplegia (HCC)   Hypotension   Bacteremia   Poor dentition   Spinal stenosis of cervical region   Malnutrition of moderate degree   LOS: 22 days   A & P  Hypovolemic shock and syncope: Resolved. Admitted to ICU and now weaned off of Levophed.  Cortisol 23.5. Appreciate cardiology; EKG shows nonspecific findings and pretest probability for CAD is low.  Troponin negative. Echocardiogram demonstrates normal LVEF, normal RV size and function without valvular abnormalities. There is no sign of right heart strain. No further cardiac work-up recommended.  Strep constellatus bacteremia:ID consulted. Recommendation was for Rocephin for 2 weeks. The patient has completed this course. Repeat blood cultures obtained on 3/1 have had  no growth.  Dental caries: CT maxillofacial on 12/18/2019 without mention of any dental caries or abscess. Orthopantogram on 12/24/2019 showed numerous dental caries. Mild lucency around many of the remaining lower teeth. Dentristy was consulted. The patient underwent dental extraction 3/11 with Dr. Robin Searing. Follow-up with primary dentist of her choice for fabrication of upper and lower complete dentures after adequate healing. Follow-up with dental medicine for evaluation of healing and suture removal once discharged from this admission. Heparin has been restarted.  Pulmonary Embolus: CTA chest performed on 12/20/2019 demonstrated a small nonocclusive pulmonary emboli bilaterally involving segmental and subsegmental sized vessels. Doppler of lower extremities was negative for DVT.  Neurosurgery has cleared the patient to initiate heparin on 01/07/2020. Plan to transition to NOAC prior to discharge.  Bilateral extremity weakness with incomplete spinal cord injury C3-C6:Distal lower extremity weaker compared to proximal.  Pt underwent C3-4,C4-5, C5-6,  C6-7 anterior surgical decompression and fusion with cages and plate on 1/94/1740. Plan is for the patient to discharge to CIR when cleared for discharge by neurosurgery. The patient states that she is regaining some sensation and strength in her lower extremities.  Acute normocytic anemia/GI bleed/acute blood loss anemia: During her inpatient stay, the patient's hemoglobin dropped abruptly to 6.8 from 9-10 on admission. The patient received 2 units of PRBC's in transfusion on 12/25/2019. GI was consulted. The patient underwent EGD on 12/27/2019 which milderythematous mucosa in the entire gastric cavity. 2 superficial prepyloric ulcers 1 with clean base the other with pigmentation. 1 Endo Clip was deployed to close the mucosal defect. Erythematous duodenal bulb. PPI BIDrecommended for 2 months.Okay to restart IV heparin. H. pylori serology positive, start triple therapy clarithromycin/amoxcillin/PPI for 14 days starting 3/11. Hemoglobin stable at 10.4 today. Continue to monitor.  I have seen and examined this patient myself. I have spent 34 minutes in her evaluation and care.  DVT prophylaxis: IV heparin Code Status: Full code Family Communication: No family at bedside Disposition Plan:   Patient is from home prior to admission.  Currently in-hospital treatment needed due to neurosurgery performedon 01/04/2020.   Suspect patient will discharge CIR postoperatively when cleared for discharge by neurosurgery .  Selena Swaminathan, DO [facility] Direct contact: see [email or web address]  7PM-7AM contact night coverage as above 01/11/2020, 2:54 PM  LOS: 17 days

## 2020-01-10 NOTE — Progress Notes (Signed)
ANTICOAGULATION CONSULT NOTE  Pharmacy Consult for Heparin Indication: pulmonary embolus  Allergies  Allergen Reactions  . Ampicillin Diarrhea  . Chlorhexidine     Patient Measurements: Height: 5\' 9"  (175.3 cm) Weight: 121 lb 7.6 oz (55.1 kg) IBW/kg (Calculated) : 66.2 Heparin Dosing Weight: 56.5 kg  Labs: Recent Labs    01/08/20 0337 01/08/20 0337 01/09/20 0455 01/09/20 0455 01/09/20 1230 01/09/20 2012 01/10/20 0230  HGB 10.0*   < > 10.4*  --   --   --  10.9*  HCT 31.4*  --  32.7*  --   --   --  34.3*  PLT 369  --  436*  --   --   --  459*  HEPARINUNFRC 0.44   < > 0.98*   < > 0.72* 0.60 0.38   < > = values in this interval not displayed.    Estimated Creatinine Clearance: 71.6 mL/min (by C-G formula based on SCr of 0.7 mg/dL).  Assessment: 53 YOF on heparin for anticoagulation for PE. Heparin has been on/off for procedures this admit - most recently resumed on 3/19 (pt is S/P dental extractions on 3/11 and neurosurgery on 3/16).  Heparin level this morning is therapeutic at 0.38 on 650 units/hr. . Aiming for lower goal due to recent surgeries.   Hgb 10.9 stable, pltc 459 stable. No bleeding reported.  Goal of Therapy:  Heparin level 0.3-0.5 units/ml Monitor platelets by anticoagulation protocol: Yes   Plan:  Continue heparin gtt  650 units/hr Daily heparin level and CBC  4/16, RPh Clinical Pharmacist [phone] Please see AMION for all pharmacy numbers 01/10/2020 8:46 AM

## 2020-01-10 NOTE — Progress Notes (Signed)
Physical Therapy Treatment Patient Details Name: Anna Campos MRN: [id number] DOB: 04/18/1966 Today's Date: 01/10/2020    History of Present Illness 54 year old woman with no significant past medical history presenting with syncope and s/p fall. Pt found to have incomplete spincal cord injury, bacteremia, and bilat PEs. Underwent C 3-7 ACDF 3/16.     PT Comments    Pt supine in bed on arrival.  Pt very eager to move.  Pt performed progression of gt training with +2 for safety, equipment management and close chair follow.  Pt limited due to BUE weakness and B LE weakness R>L.  Pt would benefit from a hinged knee brace to avoid hyper extension in stance phase.  Pt continues to benefit from aggressive rehab in CIR to max functional gains before returning home.    Follow Up Recommendations  CIR     Equipment Recommendations  Wheelchair (measurements PT);Wheelchair cushion (measurements PT)    Recommendations for Other Services Rehab consult     Precautions / Restrictions Precautions Precautions: Fall;Cervical Precaution Comments: central cord. Monitor BP - orthostatic hypotension Required Braces or Orthoses: Cervical Brace Cervical Brace: Soft collar Restrictions Weight Bearing Restrictions: No    Mobility  Bed Mobility Overal bed mobility: Needs Assistance Bed Mobility: Rolling Rolling: Min guard Sidelying to sit: Mod assist Supine to sit: +2 for safety/equipment;Min assist;Mod assist;HOB elevated     General bed mobility comments: MIn assistance to rise into sitting and advance LEs to edge of bed.  Pt required increased time and use of bed pad to advance hips forward.  Transfers Overall transfer level: Needs assistance Equipment used: 4-wheeled walker(EVA walker) Transfers: Sit to/from Stand Sit to Stand: Mod assist;+2 safety/equipment         General transfer comment: Cues for forward weight shifting and assistance to boost into  standing.  Ambulation/Gait Ambulation/Gait assistance: Max assist;+2 safety/equipment Gait Distance (Feet): 20 Feet Assistive device: 4-wheeled walker(EVA walker) Gait Pattern/deviations: Step-through pattern;Ataxic;Narrow base of support;Trunk flexed;Decreased dorsiflexion - right     General Gait Details: Pt with R knee hyper extension in stance phase.  Required assistance to advance EVA walker forward and cues for sequencing.  Close chair follow for safety.   Stairs             Wheelchair Mobility    Modified Rankin (Stroke Patients Only)       Balance Overall balance assessment: Needs assistance   Sitting balance-Leahy Scale: Fair       Standing balance-Leahy Scale: Poor                              Cognition Arousal/Alertness: Awake/alert Behavior During Therapy: WFL for tasks assessed/performed Overall Cognitive Status: Within Functional Limits for tasks assessed                                 General Comments: Pt extremely motivated and willing to participate in therapy.       Exercises      General Comments        Pertinent Vitals/Pain Pain Assessment: No/denies pain Faces Pain Scale: Hurts even more Pain Location: R shoulder/arm Pain Descriptors / Indicators: Sore Pain Intervention(s): Monitored during session;Repositioned    Home Living                      Prior Function  PT Goals (current goals can now be found in the care plan section) Acute Rehab PT Goals Patient Stated Goal: to get better and be independent Potential to Achieve Goals: Good Progress towards PT goals: Progressing toward goals    Frequency    Min 5X/week      PT Plan Current plan remains appropriate    Co-evaluation              AM-PAC PT "6 Clicks" Mobility   Outcome Measure  Help needed turning from your back to your side while in a flat bed without using bedrails?: A Little Help needed moving from  lying on your back to sitting on the side of a flat bed without using bedrails?: A Lot Help needed moving to and from a bed to a chair (including a wheelchair)?: A Lot Help needed standing up from a chair using your arms (e.g., wheelchair or bedside chair)?: A Lot Help needed to walk in hospital room?: A Lot Help needed climbing 3-5 steps with a railing? : Total 6 Click Score: 12    End of Session Equipment Utilized During Treatment: Gait belt;Cervical collar Activity Tolerance: Patient tolerated treatment well;Patient limited by fatigue Patient left: with family/visitor present;in bed;with call bell/phone within reach;with bed alarm set Nurse Communication: Mobility status PT Visit Diagnosis: Unsteadiness on feet (R26.81);Other abnormalities of gait and mobility (R26.89);Repeated falls (R29.6);Muscle weakness (generalized) (M62.81);History of falling (Z91.81);Ataxic gait (R26.0);Difficulty in walking, not elsewhere classified (R26.2)     Time: 8676-1950 PT Time Calculation (min) (ACUTE ONLY): 23 min  Charges:  $Gait Training: 8-22 mins $Therapeutic Activity: 8-22 mins                     Erasmo Leventhal , PTA Acute Rehabilitation Services Pager [phone] Office [phone]     Marcellius Montagna Eli Hose 01/10/2020, 5:47 PM

## 2020-01-10 NOTE — Progress Notes (Signed)
Inpatient Rehabilitation-Admissions Coordinator   Notified pt of insurance approval. Unfortunately I do not have a bed available for her today in CIR but I do anticipate having an open bed for her tomorrow. Will follow up tomorrow morning for possible admit.   Cheri Rous, OTR/L  Rehab Admissions Coordinator  [phone] 01/10/2020 9:54 AM

## 2020-01-10 NOTE — Progress Notes (Signed)
Progressing well.  Okay for rehab.

## 2020-01-11 ENCOUNTER — Other Ambulatory Visit: Payer: Self-pay

## 2020-01-11 ENCOUNTER — Inpatient Hospital Stay (HOSPITAL_COMMUNITY)
Admission: RE | Admit: 2020-01-11 | Discharge: 2020-02-05 | DRG: 945 | Disposition: A | Discharge status: Home Health | Payer: Commercial Managed Care - PPO | Source: Intra-hospital | Attending: Physical Medicine and Rehabilitation | Admitting: Physical Medicine and Rehabilitation

## 2020-01-11 DIAGNOSIS — E871 Hypo-osmolality and hyponatremia: Secondary | ICD-10-CM | POA: Diagnosis not present

## 2020-01-11 DIAGNOSIS — I2699 Other pulmonary embolism without acute cor pulmonale: Secondary | ICD-10-CM | POA: Diagnosis present

## 2020-01-11 DIAGNOSIS — Z79899 Other long term (current) drug therapy: Secondary | ICD-10-CM

## 2020-01-11 DIAGNOSIS — Z681 Body mass index (BMI) 19 or less, adult: Secondary | ICD-10-CM

## 2020-01-11 DIAGNOSIS — Z803 Family history of malignant neoplasm of breast: Secondary | ICD-10-CM

## 2020-01-11 DIAGNOSIS — Z981 Arthrodesis status: Secondary | ICD-10-CM

## 2020-01-11 DIAGNOSIS — Z79891 Long term (current) use of opiate analgesic: Secondary | ICD-10-CM

## 2020-01-11 DIAGNOSIS — M4622 Osteomyelitis of vertebra, cervical region: Secondary | ICD-10-CM | POA: Diagnosis present

## 2020-01-11 DIAGNOSIS — E440 Moderate protein-calorie malnutrition: Secondary | ICD-10-CM | POA: Diagnosis present

## 2020-01-11 DIAGNOSIS — Z87891 Personal history of nicotine dependence: Secondary | ICD-10-CM | POA: Diagnosis not present

## 2020-01-11 DIAGNOSIS — R7989 Other specified abnormal findings of blood chemistry: Secondary | ICD-10-CM | POA: Diagnosis not present

## 2020-01-11 DIAGNOSIS — Z7901 Long term (current) use of anticoagulants: Secondary | ICD-10-CM

## 2020-01-11 DIAGNOSIS — G8250 Quadriplegia, unspecified: Secondary | ICD-10-CM | POA: Diagnosis present

## 2020-01-11 DIAGNOSIS — D649 Anemia, unspecified: Secondary | ICD-10-CM | POA: Diagnosis present

## 2020-01-11 DIAGNOSIS — G8254 Quadriplegia, C5-C7 incomplete: Secondary | ICD-10-CM | POA: Diagnosis present

## 2020-01-11 DIAGNOSIS — K029 Dental caries, unspecified: Secondary | ICD-10-CM | POA: Diagnosis present

## 2020-01-11 DIAGNOSIS — W19XXXD Unspecified fall, subsequent encounter: Secondary | ICD-10-CM | POA: Diagnosis present

## 2020-01-11 DIAGNOSIS — R350 Frequency of micturition: Secondary | ICD-10-CM | POA: Diagnosis present

## 2020-01-11 DIAGNOSIS — B955 Unspecified streptococcus as the cause of diseases classified elsewhere: Secondary | ICD-10-CM | POA: Diagnosis present

## 2020-01-11 DIAGNOSIS — N319 Neuromuscular dysfunction of bladder, unspecified: Secondary | ICD-10-CM | POA: Diagnosis present

## 2020-01-11 DIAGNOSIS — I959 Hypotension, unspecified: Secondary | ICD-10-CM | POA: Diagnosis not present

## 2020-01-11 DIAGNOSIS — K592 Neurogenic bowel, not elsewhere classified: Secondary | ICD-10-CM | POA: Diagnosis present

## 2020-01-11 DIAGNOSIS — R7401 Elevation of levels of liver transaminase levels: Secondary | ICD-10-CM | POA: Diagnosis not present

## 2020-01-11 DIAGNOSIS — K5900 Constipation, unspecified: Secondary | ICD-10-CM | POA: Diagnosis present

## 2020-01-11 DIAGNOSIS — S14105D Unspecified injury at C5 level of cervical spinal cord, subsequent encounter: Secondary | ICD-10-CM | POA: Diagnosis present

## 2020-01-11 DIAGNOSIS — R7881 Bacteremia: Secondary | ICD-10-CM | POA: Diagnosis present

## 2020-01-11 DIAGNOSIS — Z716 Tobacco abuse counseling: Secondary | ICD-10-CM

## 2020-01-11 DIAGNOSIS — L8990 Pressure ulcer of unspecified site, unspecified stage: Secondary | ICD-10-CM | POA: Insufficient documentation

## 2020-01-11 LAB — CBC
HCT: 35.9 % — ABNORMAL LOW (ref 36.0–46.0)
Hemoglobin: 11.6 g/dL — ABNORMAL LOW (ref 12.0–15.0)
MCH: 30.8 pg (ref 26.0–34.0)
MCHC: 32.3 g/dL (ref 30.0–36.0)
MCV: 95.2 fL (ref 80.0–100.0)
Platelets: 471 10*3/uL — ABNORMAL HIGH (ref 150–400)
RBC: 3.77 MIL/uL — ABNORMAL LOW (ref 3.87–5.11)
RDW: 13.6 % (ref 11.5–15.5)
WBC: 7.6 10*3/uL (ref 4.0–10.5)
nRBC: 0 % (ref 0.0–0.2)

## 2020-01-11 LAB — HEPARIN LEVEL (UNFRACTIONATED): Heparin Unfractionated: 0.31 IU/mL (ref 0.30–0.70)

## 2020-01-11 MED ORDER — HYDROCODONE-ACETAMINOPHEN 10-325 MG PO TABS
1.0000 | ORAL_TABLET | ORAL | Status: DC | PRN
  Administered 2020-01-11: 2 via ORAL
  Filled 2020-01-11: qty 2

## 2020-01-11 MED ORDER — AMOXICILLIN 500 MG PO CAPS: 1000.0000 mg | ORAL_CAPSULE | Freq: Two times a day (BID) | ORAL | 0 refills | Status: DC

## 2020-01-11 MED ORDER — ACETAMINOPHEN 325 MG PO TABS
650.0000 mg | ORAL_TABLET | ORAL | Status: DC | PRN
  Administered 2020-01-13 – 2020-01-28 (×9): 650 mg via ORAL
  Filled 2020-01-11 (×11): qty 2

## 2020-01-11 MED ORDER — APIXABAN 5 MG PO TABS: 5.0000 mg | ORAL_TABLET | Freq: Two times a day (BID) | ORAL | Status: DC

## 2020-01-11 MED ORDER — PANTOPRAZOLE SODIUM 40 MG PO TBEC: 40.0000 mg | DELAYED_RELEASE_TABLET | Freq: Two times a day (BID) | ORAL | 0 refills | Status: DC

## 2020-01-11 MED ORDER — ONDANSETRON HCL 4 MG/2ML IJ SOLN: 4.0000 mg | Freq: Four times a day (QID) | INTRAMUSCULAR | Status: DC | PRN

## 2020-01-11 MED ORDER — PANTOPRAZOLE SODIUM 40 MG PO TBEC
40.0000 mg | DELAYED_RELEASE_TABLET | Freq: Two times a day (BID) | ORAL | Status: DC
  Administered 2020-01-11 – 2020-02-05 (×50): 40 mg via ORAL
  Filled 2020-01-11 (×50): qty 1

## 2020-01-11 MED ORDER — ENSURE ENLIVE PO LIQD: 237.0000 mL | Freq: Two times a day (BID) | ORAL | 12 refills | Status: DC

## 2020-01-11 MED ORDER — AMOXICILLIN 250 MG PO CAPS
1000.0000 mg | ORAL_CAPSULE | Freq: Two times a day (BID) | ORAL | Status: AC
  Administered 2020-01-11 – 2020-01-12 (×3): 1000 mg via ORAL
  Filled 2020-01-11 (×3): qty 4

## 2020-01-11 MED ORDER — APIXABAN 5 MG PO TABS: 5.0000 mg | ORAL_TABLET | Freq: Two times a day (BID) | ORAL | 0 refills | Status: DC

## 2020-01-11 MED ORDER — CYCLOBENZAPRINE HCL 5 MG PO TABS: 5.0000 mg | ORAL_TABLET | Freq: Three times a day (TID) | ORAL | 0 refills | Status: DC | PRN

## 2020-01-11 MED ORDER — LORATADINE 10 MG PO TABS
10.0000 mg | ORAL_TABLET | Freq: Every day | ORAL | Status: DC
  Administered 2020-01-12 – 2020-02-05 (×25): 10 mg via ORAL
  Filled 2020-01-11 (×25): qty 1

## 2020-01-11 MED ORDER — METHOCARBAMOL 500 MG PO TABS
500.0000 mg | ORAL_TABLET | Freq: Three times a day (TID) | ORAL | Status: DC | PRN
  Administered 2020-01-12 – 2020-01-31 (×17): 500 mg via ORAL
  Filled 2020-01-11 (×17): qty 1

## 2020-01-11 MED ORDER — CHLORHEXIDINE GLUCONATE 0.12 % MT SOLN
15.0000 mL | Freq: Two times a day (BID) | OROMUCOSAL | Status: DC
  Administered 2020-01-11 – 2020-02-05 (×49): 15 mL via OROMUCOSAL
  Filled 2020-01-11 (×50): qty 15

## 2020-01-11 MED ORDER — CHLORHEXIDINE GLUCONATE 0.12 % MT SOLN: 15.0000 mL | Freq: Two times a day (BID) | OROMUCOSAL | 0 refills | Status: DC

## 2020-01-11 MED ORDER — CLARITHROMYCIN 500 MG PO TABS: 500.0000 mg | ORAL_TABLET | Freq: Two times a day (BID) | ORAL | Status: DC

## 2020-01-11 MED ORDER — OXYCODONE HCL 5 MG PO TABS
5.0000 mg | ORAL_TABLET | ORAL | Status: DC | PRN
  Administered 2020-01-12 – 2020-01-29 (×48): 10 mg via ORAL
  Filled 2020-01-11 (×50): qty 2

## 2020-01-11 MED ORDER — ADULT MULTIVITAMIN W/MINERALS CH
1.0000 | ORAL_TABLET | Freq: Every day | ORAL | Status: DC
  Administered 2020-01-12 – 2020-02-05 (×25): 1 via ORAL
  Filled 2020-01-11 (×25): qty 1

## 2020-01-11 MED ORDER — ONDANSETRON HCL 4 MG PO TABS: 4.0000 mg | ORAL_TABLET | Freq: Four times a day (QID) | ORAL | Status: DC | PRN

## 2020-01-11 MED ORDER — LORATADINE 10 MG PO TABS: 10.0000 mg | ORAL_TABLET | Freq: Every day | ORAL | 0 refills | Status: DC

## 2020-01-11 MED ORDER — ENSURE ENLIVE PO LIQD
237.0000 mL | Freq: Two times a day (BID) | ORAL | Status: DC
  Administered 2020-01-12 – 2020-01-18 (×12): 237 mL via ORAL
  Filled 2020-01-11: qty 237

## 2020-01-11 MED ORDER — CLARITHROMYCIN 500 MG PO TABS: 500.0000 mg | ORAL_TABLET | Freq: Two times a day (BID) | ORAL | 0 refills | Status: DC

## 2020-01-11 MED ORDER — HYDROCODONE-ACETAMINOPHEN 10-325 MG PO TABS: 1.0000 | ORAL_TABLET | ORAL | 0 refills | Status: DC | PRN

## 2020-01-11 MED ORDER — APIXABAN 5 MG PO TABS: 10.0000 mg | ORAL_TABLET | Freq: Two times a day (BID) | ORAL | 0 refills | Status: DC

## 2020-01-11 MED ORDER — CYCLOBENZAPRINE HCL 5 MG PO TABS: 5.0000 mg | ORAL_TABLET | Freq: Three times a day (TID) | ORAL | Status: DC | PRN

## 2020-01-11 MED ORDER — SORBITOL 70 % SOLN
30.0000 mL | Freq: Every day | Status: DC | PRN
  Administered 2020-01-11 – 2020-01-26 (×4): 30 mL via ORAL
  Filled 2020-01-11 (×5): qty 30

## 2020-01-11 MED ORDER — APIXABAN 5 MG PO TABS
5.0000 mg | ORAL_TABLET | Freq: Two times a day (BID) | ORAL | Status: DC
  Administered 2020-01-18 – 2020-02-05 (×37): 5 mg via ORAL
  Filled 2020-01-11 (×24): qty 1
  Filled 2020-01-11: qty 2
  Filled 2020-01-11 (×12): qty 1

## 2020-01-11 MED ORDER — APIXABAN 5 MG PO TABS
10.0000 mg | ORAL_TABLET | Freq: Two times a day (BID) | ORAL | Status: DC
  Administered 2020-01-11: 10 mg via ORAL
  Filled 2020-01-11: qty 2

## 2020-01-11 MED ORDER — APIXABAN 5 MG PO TABS
10.0000 mg | ORAL_TABLET | Freq: Two times a day (BID) | ORAL | Status: AC
  Administered 2020-01-11 – 2020-01-17 (×13): 10 mg via ORAL
  Filled 2020-01-11 (×13): qty 2

## 2020-01-11 MED ORDER — SALINE SPRAY 0.65 % NA SOLN: 1.0000 | NASAL | 0 refills | Status: DC | PRN

## 2020-01-11 NOTE — Progress Notes (Signed)
Inpatient Rehabilitation Medication Review by a Pharmacist  A complete drug regimen review was completed for this patient to identify any potential clinically significant medication issues.  Clinically significant medication issues were identified:  yes   Type of Medication Issue Identified Description of Issue Urgent (address now) Non-Urgent (address on AM team rounds) Plan Plan Accepted by Provider? (Yes / No / Pending AM Rounds)  Drug Interaction(s) (clinically significant)       Duplicate Therapy  Patient has two PRNs for muscle spasms with no indication for when to use one or the other when I patient experiences muscle spasms.  Non-urgen Will follow up with pharmacist working tomorrow Pending  Allergy       No Medication Administration End Date       Incorrect Dose       Additional Drug Therapy Needed       Other         For non-urgent medication issues to be resolved on team rounds tomorrow morning a [facility] Secure Chat Handoff was sent to: Ulyses Southward, PharmD   Pharmacist comments: Issues described above  Time spent performing this drug regimen review (minutes):  15  Ellison Carwin, PharmD PGY1 Pharmacy Resident

## 2020-01-11 NOTE — Discharge Instructions (Signed)
MOUTH CARE AFTER SURGERY ° °FACTS: °· Ice used in ice bag helps keep the swelling down, and can help lessen the pain. °· It is easier to treat pain BEFORE it happens. °· Spitting disturbs the clot and may cause bleeding to start again, or to get worse. °· Smoking delays healing and can cause complications. °· Sharing prescriptions can be dangerous.  Do not take medications not recently prescribed for you. °· Antibiotics may stop birth control pills from working.  Use other means of birth control while on antibiotics. °· Warm salt water rinses after the first 24 hours will help lessen the swelling:  Use 1/2 teaspoonful of table salt per oz.of water. ° °DO NOT: °· Do not spit.  Do not drink through a straw. °· Strongly advised not to smoke, dip snuff or chew tobacco at least for 3 days. °· Do not eat sharp or crunchy foods.  Avoid the area of surgery when chewing. °· Do not stop your antibiotics before your instructions say to do so. °· Do not eat hot foods until bleeding has stopped.  If you need to, let your food cool down to room temperature. ° °EXPECT: °· Some swelling, especially first 2-3 days. °· Soreness or discomfort in varying degrees.  Follow your dentist's instructions about how to handle pain before it starts. °· Pinkish saliva or light blood in saliva, or on your pillow in the morning.  This can last around 24 hours. °· Bruising inside or outside the mouth.  This may not show up until 2-3 days after surgery.  Don't worry, it will go away in time. °· Pieces of "bone" may work themselves loose.  It's OK.  If they bother you, let us know. ° °WHAT TO DO IMMEDIATELY AFTER SURGERY: °· Bite on the gauze with steady pressure for 1-2 hours.  Don't chew on the gauze. °· Do not lie down flat.  Raise your head support especially for the first 24 hours. °· Apply ice to your face on the side of the surgery.  You may apply it 20 minutes on and a few minutes off.  Ice for 8-12 hours.  You may use ice up to 24  hours. °· Before the numbness wears off, take a pain pill as instructed. °· Prescription pain medication is not always required. ° °SWELLING: °· Expect swelling for the first couple of days.  It should get better after that. °· If swelling increases 3 days or so after surgery; let us know as soon as possible. ° °FEVER: °· Take Tylenol every 4 hours if needed to lower your temperature, especially if it is at 100F or higher. °· Drink lots of fluids. °· If the fever does not go away, let us know. ° °BREATHING TROUBLE: °· Any unusual difficulty breathing means you have to have someone bring you to the emergency room ASAP ° °BLEEDING: °· Light oozing is expected for 24 hours or so. °· Prop head up with pillows °· Avoid spitting °· Do not confuse bright red fresh flowing blood with lots of saliva colored with a little bit of blood. °· If you notice some bleeding, place gauze or a tea bag where it is bleeding and apply CONSTANT pressure by biting down for 1 hour.  Avoid talking during this time.  Do not remove the gauze or tea bag during this hour to "check" the bleeding. °· If you notice bright RED bleeding FLOWING out of particular area, and filling the floor of your mouth, put   a wad of gauze on that area, bite down firmly and constantly.  Call us immediately.  If we're closed, have someone bring you to the emergency room.  ORAL HYGIENE:  Brush your teeth as usual after meals and before bedtime.  Use a soft toothbrush around the area of surgery.  DO NOT AVOID BRUSHING.  Otherwise bacteria(germs) will grow and may delay healing or encourage infection.  Since you cannot spit, just gently rinse and let the water flow out of your mouth.  DO NOT SWISH HARD.  EATING:  Cool liquids are a good point to start.  Increase to soft foods as tolerated.  PRESCRIPTIONS:  Follow the directions for your prescriptions exactly as written.  If Dr. Kristin Bruins gave you a narcotic pain medication, do not drive, operate  machinery or drink alcohol when on that medication.  QUESTIONS:  Call our office during office hours [phone] or call the Emergency Room at [phone].  ===================================================================================  Information on my medicine - ELIQUIS (apixaban)   Why was Eliquis prescribed for you? Eliquis was prescribed to treat blood clots that may have been found in the veins of your legs (deep vein thrombosis) or in your lungs (pulmonary embolism) and to reduce the risk of them occurring again.  What do You need to know about Eliquis ? The starting dose is 10 mg (two 5 mg tablets) taken TWICE daily for the FIRST SEVEN (7) DAYS, then on 01/18/20 the dose is reduced to ONE 5 mg tablet taken TWICE daily.  Eliquis may be taken with or without food.   Try to take the dose about the same time in the morning and in the evening. If you have difficulty swallowing the tablet whole please discuss with your pharmacist how to take the medication safely.  Take Eliquis exactly as prescribed and DO NOT stop taking Eliquis without talking to the doctor who prescribed the medication.  Stopping may increase your risk of developing a new blood clot.  Refill your prescription before you run out.  After discharge, you should have regular check-up appointments with your healthcare provider that is prescribing your Eliquis.    What do you do if you miss a dose? If a dose of ELIQUIS is not taken at the scheduled time, take it as soon as possible on the same day and twice-daily administration should be resumed. The dose should not be doubled to make up for a missed dose.  Important Safety Information A possible side effect of Eliquis is bleeding. You should call your healthcare provider right away if you experience any of the following: ? Bleeding from an injury or your nose that does not stop. ? Unusual colored urine (red or dark brown) or unusual colored stools (red or  black). ? Unusual bruising for unknown reasons. ? A serious fall or if you hit your head (even if there is no bleeding).  Some medicines may interact with Eliquis and might increase your risk of bleeding or clotting while on Eliquis. To help avoid this, consult your healthcare provider or pharmacist prior to using any new prescription or non-prescription medications, including herbals, vitamins, non-steroidal anti-inflammatory drugs (NSAIDs) and supplements.  This website has more information on Eliquis (apixaban): http://[email or web address].com/eliquis/home

## 2020-01-11 NOTE — Progress Notes (Addendum)
ANTICOAGULATION CONSULT NOTE - Follow Up Consult  Pharmacy Consult for Heparin Indication: pulmonary embolus  Allergies  Allergen Reactions  . Ampicillin Diarrhea  . Chlorhexidine     Patient Measurements: Height: 5\' 9"  (175.3 cm) Weight: 121 lb 7.6 oz (55.1 kg) IBW/kg (Calculated) : 66.2 Heparin Dosing Weight: 55.1 kg  Vital Signs: Temp: 98.6 F (37 C) (03/23 1130) Temp Source: Oral (03/23 1130) BP: 100/79 (03/23 1130) Pulse Rate: 91 (03/23 1130)  Labs: Recent Labs    01/09/20 0455 01/09/20 0455 01/09/20 1230 01/09/20 2012 01/10/20 0230 01/11/20 0347  HGB 10.4*   < >  --   --  10.9* 11.6*  HCT 32.7*  --   --   --  34.3* 35.9*  PLT 436*  --   --   --  459* 471*  HEPARINUNFRC 0.98*  --    < > 0.60 0.38 0.31   < > = values in this interval not displayed.    Estimated Creatinine Clearance: 71.6 mL/min (by C-G formula based on SCr of 0.7 mg/dL).  Assessment:  53 YOF on heparin for anticoagulation for PE. Heparin has been on/off for procedures this admit - most recently resumed on 3/19 (pt is S/P dental extractions on 3/11 and neurosurgery on 3/16).    Heparin level remains therapeutic (0.31) on 650 units/hr. CBC stable.   Aiming for low-therapeutic levels due to recent surgeries.  Goal of Therapy:  Heparin level 0.3-0.5 units/ml Monitor platelets by anticoagulation protocol: Yes   Plan:   Continue heparin drip at 650 units/hr  Daily heparin level and CBC.  Noted plan for oral anticoagulation prior to discharge.  4/16, Dennie Fetters Phone: [phone] 01/11/2020,11:47 AM    Addendum:   Transitioning to Eliquis.   Eliquis 10 mg BID x 1 week, then 5 mg BID.   IV heparin stopped with first dose of Eliquis.   Potential drug interaction with Clarithromycin, which could decrease Eliquis clearance and possibly increase risk of bleeding.   Clarithromycin x 14 days ends tomorrow. (With Amoxicillin and Protonix x 2 weeks for H pylori.).   Discussed with Dr.  01/13/2020.  Plan to stop Clarithromycin today instead of tomorrow.   Gerri Lins, RPh 2:51 PM 01/11/2020

## 2020-01-11 NOTE — Progress Notes (Signed)
Inpatient Rehabilitation-Admissions Coordinator   I have received medical approval from Dr. Gerri Lins for admit to CIR today. Pt and husband want to pursue CIR at this time. I have reviewed insurance benefits letter and consent forms with pt and her husband. RN and [facility] team aware of plan for admit today.   All questions answered.   Cheri Rous, OTR/L  Rehab Admissions Coordinator  [phone] 01/11/2020 1:48 PM

## 2020-01-11 NOTE — TOC Transition Note (Signed)
Transition of Care [facility]) - CM/SW Discharge Note   Patient Details  Name: Anna Campos MRN: [id number] Date of Birth: 16-May-1966  [facility] CM/SW Contact:  Kermit Balo, RN Phone Number: 01/11/2020, 4:00 PM   Clinical Narrative:    Pt is discharging to CIR today. CM is signing off.    Final next level of care: IP Rehab Facility Barriers to Discharge: No Barriers Identified   Patient Goals and CMS Choice        Discharge Placement                       Discharge Plan and Services                                     Social Determinants of Health (SDOH) Interventions     Readmission Risk Interventions No flowsheet data found.

## 2020-01-11 NOTE — Progress Notes (Signed)
Orthopedic Tech Progress Note Patient Details:  [name] [birth year] [id number] Called in order to [employer] for a RIGHT HINGED KNEE BRACE  Patient ID: Anna Campos, female   DOB: August 16, 1966, 54 y.o.   MRN: [id number]   Donald Pore 01/11/2020, 10:28 AM

## 2020-01-11 NOTE — Progress Notes (Signed)
Anna Rouge, MD  Physician  Physical Medicine and Rehabilitation  Consult Note  Signed  Date of Service:  12/21/2019  8:37 AM      Related encounter: ED to Hosp-Admission (Discharged) from 12/18/2019 in [facility]      Signed      Expand AllCollapse All            Physical Medicine and Rehabilitation Consult Reason for Consult: Paraplegia Referring Physician: [facility]     HPI: Anna Campos is a 53 y.o. right-handed female with unremarkable past medical history except tobacco abuse on no prescription medications.  Per chart review lives with spouse and 30 year old daughter.  Independent prior to admission.  Works in Clinical biochemist for [employer].  1 level home 3 steps to entry.  Presented 12/18/2019 with syncope, orthostasis and generalized fatigue as well as reported fall by her family as well as questionable loss of consciousness.  Admission chemistries and blood culture Streptococcus, alcohol negative, lactic acid 1.8.  Cranial CT scan negative.  Echocardiogram with ejection fraction of 65% without emboli.  CT of chest abdomen pelvis negative.  CT angiogram of the chest showed small nonocclusive pulmonary emboli bilaterally involving segmental and subsegmental size vessels.  Bilateral lower extremity Dopplers negative.  Patient was placed on heparin therapy.  MRI of the brain with no evidence of acute intracranial abnormality.  Infectious disease consulted for streptococcal bacteremia currently maintained on vancomycin with follow-up per infectious disease.  CT of cervical thoracic spine showed prominent spinal cord signal abnormality at the C4-C6 levels.  Involvement of the entire cord cross-section at the lower C5 level.  Edema signal within the interspinous spaces at the C3-T2 levels.  A C4-C5 posterior disc osteophyte complex contributes to severe spinal canal stenosis with mild spinal cord flattening.  Neurosurgery Dr. Jordan Likes consulted and currently no plan for  surgical intervention until her medical situation is cleared.  Therapy evaluations completed with recommendations of physical medicine rehab consult. \ Pt reports leg cramps since daughter was born- is 75- now. Required in/out caths because is peeing but not emptying.  Hasn't had BM, but has decreased sensaition- not sure about bowel control.  Most of pain is leg cramping/spasms.  Also c/o severe stiffness esp in LEs- feels weak in arms and legs.  Is moving better than was when came in this weekend.        Review of Systems  Constitutional: Negative for chills and fever.  HENT: Negative for hearing loss.   Eyes: Negative for blurred vision and double vision.  Respiratory: Negative for cough and shortness of breath.   Cardiovascular: Negative for chest pain, palpitations and leg swelling.  Gastrointestinal: Positive for constipation. Negative for heartburn, nausea and vomiting.  Genitourinary: Negative for dysuria, flank pain and hematuria.  Musculoskeletal: Positive for myalgias.  Skin: Negative for rash.  Neurological: Positive for dizziness and weakness.       Syncope  All other systems reviewed and are negative.       Past Medical History:  Diagnosis Date  . PE (pulmonary thromboembolism) (HCC) 12/20/2019  . Syncope 12/20/2019         Past Surgical History:  Procedure Laterality Date  . CESAREAN SECTION             Family History  Problem Relation Age of Onset  . Breast cancer Mother      Social History:  reports that she has quit smoking. She has never used smokeless tobacco. She reports current  alcohol use. She reports previous drug use. Allergies:  Allergies  Allergen Reactions  . Ampicillin Diarrhea          Medications Prior to Admission  Medication Sig Dispense Refill  . Multiple Vitamin (MULTIVITAMIN WITH MINERALS) TABS tablet Take 1 tablet by mouth daily.      . naproxen sodium (ALEVE) 220 MG tablet Take 220 mg by mouth daily as needed (pain).            Home: Home Living Family/patient expects to be discharged to:: Private residence Living Arrangements: Spouse/significant other Available Help at Discharge: Family, Available 24 hours/day Type of Home: House Home Access: Stairs to enter [employer] of Steps: 3-4 Entrance Stairs-Rails: Right Home Layout: One level Bathroom Shower/Tub: Tub/shower unit, Architectural technologist: Standard Home Equipment: None Additional Comments: Spouse home by 3pm daily  Functional History: Prior Function Level of Independence: Independent Comments: works in Therapist, art for [employer], works from home, 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 Functional Status:  Mobility: [place] bed mobility: Needs Assistance Bed Mobility: Rolling, Sidelying to Sit, Sit to [place]: Max assist, +2 for physical assistance Sidelying to sit: Max assist, +2 for physical assistance Sit to sidelying: Max assist, +2 for physical assistance General bed mobility comments: Pt attempting to assist with  BUEs; poor trunk control and ability to move BUEs and BLEs. Pt intermittent assist for sitting upright. Pt requiring increased assist for trunk elevation and BLE management. Transfers Overall transfer level: Needs assistance Equipment used: 2 person hand held assist(2 person lift with gait belt and bed pad) Transfers: Sit to/from Stand Sit to Stand: Total assist, +2 physical assistance General transfer comment: pt with poor trunk control unable to pull up with bilat hands and required bilat knee blocking, completed 3 attempts,pt unable to achieve bilat knee extension , dependent on physical assist x2 to maintain up right position Ambulation/Gait General Gait Details: unable at this time   ADL: ADL Overall ADL's : Needs assistance/impaired Eating/Feeding: Maximal assistance, Bed level Eating/Feeding Details (indicate cue type and reason): Pt with practice can perform lap to mouth pattern, but  unable to grip built up spoon Grooming: Total assistance Upper Body Bathing: Total assistance Lower Body Bathing: Total assistance Upper Body Dressing : Total assistance Lower Body Dressing: Total assistance Toilet Transfer: Total assistance, +2 for physical assistance, +2 for safety/equipment Toileting- Clothing Manipulation and Hygiene: Total assistance Functional mobility during ADLs: Total assistance General ADL Comments: Pt maxA to totalA due to weakness, decreased activity tolerance and decreased coordination.   Cognition: Cognition Overall Cognitive Status: Within Functional Limits for tasks assessed Orientation Level: Oriented X4 Cognition Arousal/Alertness: Awake/alert Behavior During Therapy: WFL for tasks assessed/performed Overall Cognitive Status: Within Functional Limits for tasks assessed General Comments: able to correct Feb 28th to March 1st.   Blood pressure 105/75, pulse 70, temperature 99.3 F (37.4 C), temperature source Oral, resp. rate 17, height 5\' 9"  (1.753 m), weight 63.9 kg, SpO2 98 %. Physical Exam  Nursing note and vitals reviewed. Constitutional: She appears well-developed and well-nourished.  Awake, alert, appropriate, a little lisp occ; husband at bedside, sitting up slightly in bed; NAD; IVs running on both sides  HENT:  Head: Normocephalic and atraumatic.  Nose: Nose normal.  Mouth/Throat: Oropharynx is clear and moist.  Poor dentition- esp front teeth; cranial nerves intact- no facial sensation changes; no facial droop; tongue midline;   Eyes: Pupils are equal, round, and reactive to light. Conjunctivae and EOM are normal. No  scleral icterus.  No nystagmus B/L   Neck: No tracheal deviation present.  Cardiovascular: Normal rate and regular rhythm.  Respiratory: Effort normal and breath sounds normal. No stridor. No respiratory distress. She has no wheezes. She has no rales.  GI:  Soft, slightly distended- LBM >3 days ago; NT; no rebound;     Musculoskeletal:     Cervical back: Normal range of motion and neck supple.     Comments:  RUE_ biceps 4/5, WE 4/5, tricep 2/5, grip 1/5, finger 0/5 LUE- bicep 4/5, WE 3/5, triceps 3/5, grip 1/5, finger 0/5  RLE- HF 1/5, KE 3/5, DF 0/5, PF 2/5, EHL 2/5 LLE- HF- 1/5, KE 2/5, DF 0/5, PF 3/5, EHL 2/5  Full ROm of joints, but is developing cramping/contractures of hands- fingers are curling already.   Neurological:  Patient is alert in no acute distress oriented x3. Sensation decreased from T2- S5 to light touch and pinprick- each dermatome was checked B/L MAS 1+ in LEs if not 2 possibly B/L at hips, knees and ankles No clonus; Hoffmans (+) on LUE; not RUE  Described decreased sensation as tingling, pins and needles.  Initially was supersensitive to touch- now improved.   Skin:  Hands slightly swollen, esp in fingers due to disuse.  IVs in B/L UEs- no infiltration   Psychiatric: She has a normal mood and affect.      Lab Results Last 24 Hours        Results for orders placed or performed during the hospital encounter of 12/18/19 (from the past 24 hour(s))  Glucose, capillary     Status: None    Collection Time: 12/20/19 11:41 AM  Result Value Ref Range    Glucose-Capillary 92 70 - 99 mg/dL  Culture, blood (routine x 2)     Status: None (Preliminary result)    Collection Time: 12/20/19  6:29 PM    Specimen: BLOOD RIGHT WRIST  Result Value Ref Range    Specimen Description BLOOD RIGHT WRIST      Special Requests          BOTTLES DRAWN AEROBIC AND ANAEROBIC Blood Culture adequate volume    Culture          NO GROWTH < 12 HOURS Performed at [facility], [address], [place], [place] [address]      Report Status PENDING    Culture, blood (routine x 2)     Status: None (Preliminary result)    Collection Time: 12/20/19  6:45 PM    Specimen: BLOOD RIGHT HAND  Result Value Ref Range    Specimen Description BLOOD RIGHT HAND      Special Requests          BOTTLES DRAWN  AEROBIC AND ANAEROBIC Blood Culture adequate volume    Culture          NO GROWTH < 12 HOURS Performed at [facility], [address] [place], [place] [address]      Report Status PENDING    Heparin level (unfractionated)     Status: None    Collection Time: 12/20/19  6:50 PM  Result Value Ref Range    Heparin Unfractionated 0.43 0.30 - 0.70 IU/mL  Basic metabolic panel     Status: Abnormal    Collection Time: 12/21/19  4:15 AM  Result Value Ref Range    Sodium 141 135 - 145 mmol/L    Potassium 3.8 3.5 - 5.1 mmol/L    Chloride 113 (  H) 98 - 111 mmol/L    CO2 21 (L) 22 - 32 mmol/L    Glucose, Bld 80 70 - 99 mg/dL    BUN 6 6 - 20 mg/dL    Creatinine, Ser 9.14 0.44 - 1.00 mg/dL    Calcium 8.2 (L) 8.9 - 10.3 mg/dL    GFR calc non Af Amer >60 >60 mL/min    GFR calc Af Amer >60 >60 mL/min    Anion gap 7 5 - 15  CBC     Status: Abnormal    Collection Time: 12/21/19  4:15 AM  Result Value Ref Range    WBC 5.9 4.0 - 10.5 K/uL    RBC 3.29 (L) 3.87 - 5.11 MIL/uL    Hemoglobin 9.9 (L) 12.0 - 15.0 g/dL    HCT 78.2 (L) 95.6 - 46.0 %    MCV 90.9 80.0 - 100.0 fL    MCH 30.1 26.0 - 34.0 pg    MCHC 33.1 30.0 - 36.0 g/dL    RDW 21.3 08.6 - 57.8 %    Platelets 180 150 - 400 K/uL    nRBC 0.0 0.0 - 0.2 %  Magnesium     Status: None    Collection Time: 12/21/19  4:15 AM  Result Value Ref Range    Magnesium 1.9 1.7 - 2.4 mg/dL  Heparin level (unfractionated)     Status: Abnormal    Collection Time: 12/21/19  4:15 AM  Result Value Ref Range    Heparin Unfractionated 1.06 (H) 0.30 - 0.70 IU/mL       Imaging Results (Last 48 hours)  CT ANGIO CHEST PE W OR WO CONTRAST   Result Date: 12/20/2019 CLINICAL DATA:  54 year old female with history of syncope. Elevated D-dimer. Chest pain. EXAM: CT ANGIOGRAPHY CHEST WITH CONTRAST TECHNIQUE: Multidetector CT imaging of the chest was performed using the standard protocol during bolus administration of intravenous contrast. Multiplanar CT image  reconstructions and MIPs were obtained to evaluate the vascular anatomy. CONTRAST:  80mL OMNIPAQUE IOHEXOL 350 MG/ML SOLN COMPARISON:  Chest CT 12/18/2019. FINDINGS: Cardiovascular: There are small filling defects within the pulmonary arteries bilaterally involving segmental sized branches in the left lower lobe and subsegmental sized branches in the right lower lobe. These are nonocclusive. No larger central or lobar sized filling defects are noted. Heart size is normal. There is no significant pericardial fluid, thickening or pericardial calcification. There is aortic atherosclerosis, as well as atherosclerosis of the great vessels of the mediastinum and the coronary arteries, including calcified atherosclerotic plaque in the left anterior descending coronary artery. Mediastinum/Nodes: No pathologically enlarged mediastinal or hilar lymph nodes. Esophagus is unremarkable in appearance. No axillary lymphadenopathy. Lungs/Pleura: Dependent areas of subsegmental atelectasis are noted in lower lobes of the lungs bilaterally. No acute consolidative airspace disease. No pleural effusions. Upper Abdomen: Intermediate attenuation material lying dependently in the gallbladder, presumably biliary sludge. Musculoskeletal: There are no aggressive appearing lytic or blastic lesions noted in the visualized portions of the skeleton. Review of the MIP images confirms the above findings. IMPRESSION: 1. Small nonocclusive pulmonary emboli bilaterally involving segmental and subsegmental sized vessels, as above. 2. Aortic atherosclerosis, in addition to left anterior descending coronary artery disease. Please note that although the presence of coronary artery calcium documents the presence of coronary artery disease, the severity of this disease and any potential stenosis cannot be assessed on this non-gated CT examination. Assessment for potential risk factor modification, dietary therapy or pharmacologic therapy may be warranted,  if clinically  indicated. 3. Biliary sludge in the gallbladder. These results will be called to the ordering clinician or representative by the Radiologist Assistant, and communication documented in the PACS or zVision Dashboard. Aortic Atherosclerosis (ICD10-I70.0). Electronically Signed   By: Trudie Reed M.D.   On: 12/20/2019 10:53    MR BRAIN WO CONTRAST   Result Date: 12/20/2019 CLINICAL DATA:  Neuro deficit, subacute. Additional history provided: Syncope, generalized fatigue, poor p.o. intake. EXAM: MRI HEAD WITHOUT CONTRAST TECHNIQUE: Multiplanar, multiecho pulse sequences of the brain and surrounding structures were obtained without intravenous contrast. COMPARISON:  Head CT 12/18/2019. FINDINGS: Brain: The examination is intermittently motion degraded. Most notably there is mild-to-moderate motion degradation of the axial T2 FLAIR sequence and moderate/severe motion degradation of the coronal T2 weighted sequence. There is no evidence of acute infarct. No evidence of intracranial mass. No midline shift or extra-axial fluid collection. No chronic intracranial blood products. Mild scattered T2/FLAIR hyperintensity within the cerebral white matter is nonspecific, but most commonly seen on the basis of chronic small vessel ischemic disease. Mild generalized parenchymal atrophy. Incidentally noted cavum septum pellucidum and cavum vergae. Vascular: Visualized orbits demonstrate no acute abnormality. Skull and upper cervical spine: No focal marrow lesion. Sinuses/Orbits: Visualized orbits demonstrate no acute abnormality. Mild scattered paranasal sinus mucosal thickening greatest within bilateral ethmoid air cells. No significant mastoid effusion. IMPRESSION: 1. Intermittently motion degraded exam. 2. No evidence of acute intracranial abnormality. 3. Mild scattered T2 hyperintense signal changes within the cerebral white matter are nonspecific, but most commonly seen on the basis of chronic small vessel  ischemic disease. 4. Mild generalized parenchymal atrophy. 5. Mild paranasal sinus mucosal thickening. Electronically Signed   By: Jackey Loge DO   On: 12/20/2019 16:57    MR CERVICAL SPINE WO CONTRAST   Result Date: 12/20/2019 CLINICAL DATA:  Spinal stenosis, cervical spine. EXAM: MRI CERVICAL SPINE WITHOUT CONTRAST TECHNIQUE: Multiplanar, multisequence MR imaging of the cervical spine was performed. No intravenous contrast was administered. COMPARISON:  CT cervical spine 12/18/2019. FINDINGS: Mildly motion degraded examination Alignment: Mild reversal of the expected cervical lordosis. No significant spondylolisthesis. Vertebrae: Vertebral body height is maintained. No significant marrow edema or suspicious osseous lesion. Multilevel degenerative endplate irregularity and mixed degenerative endplate marrow signal. This includes mild degenerative endplate edema at Z6-X0. Cord: There is prominent T2/STIR hyperintense cord signal abnormality at the C4 through C6 levels. The cord signal abnormality involves the entire cord at the lower C5 level. Posterior Fossa, vertebral arteries, paraspinal tissues: No abnormality identified within included portions of the posterior fossa. Flow voids preserved within the cervical vertebral arteries. There is STIR hyperintense signal within the interspinous spaces at the C3-C4 through T1-T2 levels. Disc levels: Moderate/severe C3-C4 disc degeneration. Moderate disc degeneration at the remaining cervical levels. C2-C3: No disc herniation. No significant canal or foraminal stenosis. C3-C4: Posterior disc osteophyte complex. Uncinate/facet hypertrophy. Mild spinal canal stenosis. There is contact upon the ventral spinal cord with minimal flattening of the ventral cord. Bilateral neural foraminal narrowing (moderate right, moderate/severe left). C4-C5: Posterior disc osteophyte complex. Uncinate/facet hypertrophy. Severe spinal canal stenosis with mild spinal cord flattening.  Bilateral neural foraminal narrowing (moderate right, severe left). C5-C6: Posterior disc osteophyte complex. Uncinate/facet hypertrophy. Mild spinal canal stenosis. Severe bilateral neural foraminal narrowing. C6-C7: Posterior disc osteophyte complex. Uncinate/facet hypertrophy. Mild spinal canal stenosis. Severe bilateral neural foraminal narrowing. C7-T1: No disc herniation. No significant canal or foraminal stenosis. IMPRESSION: Prominent spinal cord signal abnormality at the C4 through C6 levels. There is involvement  of the entire cord cross-section at the lower C5 level. Differential considerations include spinal cord infarct, contusion, demyelinating disease or other inflammatory/infectious process. Edema signal within the interspinous spaces at the C3-T2 levels, which may reflect interspinous ligament injury. Cervical spondylosis as described and most notably as follows. At C4-C5, a posterior disc osteophyte complex contributes to severe spinal canal stenosis with mild spinal cord flattening. Bilateral neural foraminal narrowing (moderate right, severe left) No more than mild spinal canal stenosis at the remaining levels. Additional sites of neural foraminal narrowing, including site of severe and moderate/severe neural foraminal narrowing as detailed. Electronically Signed   By: Jackey Loge DO   On: 12/20/2019 17:32    MR THORACIC SPINE WO CONTRAST   Result Date: 12/20/2019 CLINICAL DATA:  Distal greater than proximal weakness, trauma/fall at home prior to admission. EXAM: MRI THORACIC SPINE WITHOUT CONTRAST TECHNIQUE: Multiplanar, multisequence MR imaging of the thoracic spine was performed. No intravenous contrast was administered. COMPARISON:  CT chest 12/18/2019 FINDINGS: Mildly motion degraded examination. Alignment:  Alignment is maintained. Vertebrae: Vertebral body height is maintained. No significant marrow edema or suspicious osseous lesion. Cord:  No spinal cord signal abnormality is  identified. Paraspinal and other soft tissues: Mild nonspecific edema within the dorsal subcutaneous soft tissues overlying the lower thoracic and visualized upper lumbar spine. No abnormality identified within included portions of the thorax or upper abdomen. Disc levels: Mild disc degeneration throughout the thoracic spine. Mild multilevel disc bulges and facet arthrosis/ligamentum flavum hypertrophy. No significant spinal canal stenosis at any level. No compressive foraminal stenosis. IMPRESSION: Mildly motion degraded examination. No spinal cord signal abnormality is identified. Mild thoracic spondylosis with no significant spinal canal stenosis, and no compressive foraminal narrowing. Electronically Signed   By: Jackey Loge DO   On: 12/20/2019 17:59    ECHOCARDIOGRAM COMPLETE   Result Date: 12/19/2019    ECHOCARDIOGRAM REPORT   Patient Name:   AZUL COFFIE Date of Exam: 12/19/2019 Medical Rec #:  [id number]   Height:       67.0 in Accession #:    [id number]  Weight:       131.4 lb Date of Birth:  05-10-1966  BSA:          1.692 m Patient Age:    53 years    BP:           90/50 mmHg Patient Gender: F           HR:           77 bpm. Exam Location:  Inpatient Procedure: 2D Echo Indications:    syncope 780.2  History:        Patient has no prior history of Echocardiogram examinations.  Sonographer:    Delcie Roch Referring Phys: [id number] JENNIFER T KRALL IMPRESSIONS  1. Left ventricular ejection fraction, by estimation, is 60 to 65%. The left ventricle has normal function. The left ventricle has no regional wall motion abnormalities. Left ventricular diastolic parameters were normal.  2. Right ventricular systolic function is normal. The right ventricular size is normal. Tricuspid regurgitation signal is inadequate for assessing PA pressure.  3. The mitral valve is normal in structure and function. No evidence of mitral valve regurgitation. No evidence of mitral stenosis.  4. The aortic valve is tricuspid.  Aortic valve regurgitation is not visualized. Mild aortic valve sclerosis is present, with no evidence of aortic valve stenosis.  5. The inferior vena cava is dilated in size with >50% respiratory variability, suggesting right  atrial pressure of 8 mmHg. FINDINGS  Left Ventricle: Left ventricular ejection fraction, by estimation, is 60 to 65%. The left ventricle has normal function. The left ventricle has no regional wall motion abnormalities. The left ventricular internal cavity size was normal in size. There is  no left ventricular hypertrophy. Left ventricular diastolic parameters were normal. Normal left ventricular filling pressure. Right Ventricle: The right ventricular size is normal. No increase in right ventricular wall thickness. Right ventricular systolic function is normal. Tricuspid regurgitation signal is inadequate for assessing PA pressure. Left Atrium: Left atrial size was normal in size. Right Atrium: Right atrial size was normal in size. Pericardium: There is no evidence of pericardial effusion. Mitral Valve: The mitral valve is normal in structure and function. Normal mobility of the mitral valve leaflets. No evidence of mitral valve regurgitation. No evidence of mitral valve stenosis. Tricuspid Valve: The tricuspid valve is normal in structure. Tricuspid valve regurgitation is mild . No evidence of tricuspid stenosis. Aortic Valve: The aortic valve is tricuspid. Aortic valve regurgitation is not visualized. Mild aortic valve sclerosis is present, with no evidence of aortic valve stenosis. Pulmonic Valve: The pulmonic valve was normal in structure. Pulmonic valve regurgitation is not visualized. No evidence of pulmonic stenosis. Aorta: The aortic root is normal in size and structure. Venous: The inferior vena cava is dilated in size with greater than 50% respiratory variability, suggesting right atrial pressure of 8 mmHg. IAS/Shunts: No atrial level shunt detected by color flow Doppler.  LEFT  VENTRICLE PLAX 2D LVIDd:         4.30 cm  Diastology LVIDs:         2.80 cm  LV e' lateral:   12.10 cm/s LV PW:         0.90 cm  LV E/e' lateral: 6.6 LV IVS:        0.90 cm  LV e' medial:    11.30 cm/s LVOT diam:     1.80 cm  LV E/e' medial:  7.0 LV SV:         73 LV SV Index:   43 LVOT Area:     2.54 cm  RIGHT VENTRICLE RV S prime:     15.60 cm/s TAPSE (M-mode): 2.2 cm LEFT ATRIUM             Index       RIGHT ATRIUM          Index LA diam:        2.80 cm 1.66 cm/m  RA Area:     8.61 cm LA Vol (A2C):   27.4 ml 16.20 ml/m RA Volume:   16.80 ml 9.93 ml/m LA Vol (A4C):   27.9 ml 16.49 ml/m LA Biplane Vol: 27.7 ml 16.38 ml/m  AORTIC VALVE LVOT Vmax:   144.00 cm/s LVOT Vmean:  93.300 cm/s LVOT VTI:    0.285 m  AORTA Ao Root diam: 3.20 cm MITRAL VALVE MV Area (PHT): 3.42 cm    SHUNTS MV Decel Time: 222 msec    Systemic VTI:  0.29 m MV E velocity: 79.30 cm/s  Systemic Diam: 1.80 cm MV A velocity: 89.10 cm/s MV E/A ratio:  0.89 Armanda Magicraci Turner MD Electronically signed by Armanda Magicraci Turner MD Signature Date/Time: 12/19/2019/10:44:12 AM    Final     VAS US LOWER EXTREMITY VENOUS (DVT)   Result Date: 12/20/2019  Lower Venous DVTStudy Indications: Pulmonary embolism.  Anticoagulation: Heparin. Comparison Study: No prior exam. Performing Technologist: Kennedy BuckerKristy Siebrecht ARDMS, RVT  Examination Guidelines: A complete evaluation includes B-mode imaging, spectral Doppler, color Doppler, and power Doppler as needed of all accessible portions of each vessel. Bilateral testing is considered an integral part of a complete examination. Limited examinations for reoccurring indications may be performed as noted. The reflux portion of the exam is performed with the patient in reverse Trendelenburg.  +---------+---------------+---------+-----------+----------+--------------+ RIGHT    CompressibilityPhasicitySpontaneityPropertiesThrombus Aging +---------+---------------+---------+-----------+----------+--------------+ CFV      Full            Yes      Yes                                 +---------+---------------+---------+-----------+----------+--------------+ SFJ      Full                                                        +---------+---------------+---------+-----------+----------+--------------+ FV Prox  Full                                                        +---------+---------------+---------+-----------+----------+--------------+ FV Mid   Full                                                        +---------+---------------+---------+-----------+----------+--------------+ FV DistalFull                                                        +---------+---------------+---------+-----------+----------+--------------+ PFV      Full                                                        +---------+---------------+---------+-----------+----------+--------------+ POP      Full           Yes      Yes                                 +---------+---------------+---------+-----------+----------+--------------+ PTV      Full                                                        +---------+---------------+---------+-----------+----------+--------------+ PERO     Full                                                        +---------+---------------+---------+-----------+----------+--------------+   +---------+---------------+---------+-----------+----------+--------------+  LEFT     CompressibilityPhasicitySpontaneityPropertiesThrombus Aging +---------+---------------+---------+-----------+----------+--------------+ CFV      Full           Yes      Yes                                 +---------+---------------+---------+-----------+----------+--------------+ SFJ      Full                                                        +---------+---------------+---------+-----------+----------+--------------+ FV Prox  Full                                                         +---------+---------------+---------+-----------+----------+--------------+ FV Mid   Full                                                        +---------+---------------+---------+-----------+----------+--------------+ FV DistalFull                                                        +---------+---------------+---------+-----------+----------+--------------+ PFV      Full                                                        +---------+---------------+---------+-----------+----------+--------------+ POP      Full           Yes      Yes                                 +---------+---------------+---------+-----------+----------+--------------+ PTV      Full                                                        +---------+---------------+---------+-----------+----------+--------------+ PERO     Full                                                        +---------+---------------+---------+-----------+----------+--------------+     Summary: BILATERAL: - No evidence of deep vein thrombosis seen in the lower extremities, bilaterally.  RIGHT: - No cystic structure found in the popliteal fossa.  LEFT: - No cystic structure found in the popliteal fossa.  *See table(s) above for measurements  and observations. Electronically signed by Fabienne Bruns MD on 12/20/2019 at 5:54:34 PM.    Final          Assessment/Plan: Diagnosis: C5 quadriplegia- incomplete- ASIA C.  1. Does the need for close, 24 hr/day medical supervision in concert with the patient's rehab needs make it unreasonable for this patient to be served in a less intensive setting? Yes 2. Co-Morbidities requiring supervision/potential complications: SCI, neurogenic bowel; neurogenic bladder, Pes, bacteremia 3. Due to bladder management, bowel management, safety, skin/wound care, disease management, medication administration, pain management and patient education, does the patient require 24 hr/day  rehab nursing? Yes 4. Does the patient require coordinated care of a physician, rehab nurse, therapy disciplines of PT and OT to address physical and functional deficits in the context of the above medical diagnosis(es)? Yes Addressing deficits in the following areas: balance, endurance, strength, transferring, bowel/bladder control, bathing, dressing, feeding, grooming, toileting and psychosocial support 5. Can the patient actively participate in an intensive therapy program of at least 3 hrs of therapy per day at least 5 days per week? Yes 6. The potential for patient to make measurable gains while on inpatient rehab is good 7. Anticipated functional outcomes upon discharge from inpatient rehab are supervision and min assist  with PT, supervision and min assist with OT, n/a with SLP. 8. Estimated rehab length of stay to reach the above functional goals is: 4 weeks 9. Anticipated discharge destination: Home 10. Overall Rehab/Functional Prognosis: good   RECOMMENDATIONS: This patient's condition is appropriate for continued rehabilitative care in the following setting: CIR Patient has agreed to participate in recommended program. Potentially Note that insurance prior authorization may be required for reimbursement for recommended care.   Comment:  Pt has C5 incomplete Quadriplegia based on ASIA exam done today.    1. Suggest bowel program- at least start with 2 senokot in AM daily and then follow with suppository every evening after dinner- if doesn't start to clean her out, use Mg citrate ot clean out.    2. Suggest Baclofen 5 mg TID for spasticity- flexeril and Robaxin don't help spasticity, and that's why she feels so stiff- spasticity occurs in 70% of SCI patients- however so soon, means either she already had it, or it bodes ill for increasing spasticity formation. Not clear- might have had before fall based on pt history.    3. Strongly suggest starting Flomax 0.4 mg qsupper to try and  get her emptying her bladder- has neurogenic bladder, but not emptying, although voiding some.    4. OT suggested getting night rest wrist splints- we need to order to prevent contracture formation.   5. Also, please order Prevalon boots (the puffy boots) from [employer] just type [employer] into orders and will shoe options- can type in other if need be.    6. Pt at high risk of forming DVT, but already has PE- on heparin gtt- will need to follow closely since SCI patients can form DVTs even on meds.      7. Pt at higher risk for skin breakdown- please float heels until boots come and turn every few hours while in bed- suggest she sleep on her side, if possible.    7. Once pt medically stable, it appropriate to get pt into CIR_ will start process, will con't to follow up on these issues once comes to rehab. Please make sure pt has BM before coming to rehab, so doesn't miss therapy for cleaning her out. Thank you so much!  8. Of note, spent 1 hour directly with pt and husband answering quesitons- they seemed to think pt's weakness would "automatically" get better- explained it could, since it's already better, however it could take up to 1+ years to get her maximal return and that doesn't mean 100% return. Also explained how bowel and bladder also are affected by SCI- she said it was first time she heard the term, SCI patient.        If additional questions, call Dr Berline Chough by Loretha Stapler.   Mcarthur Rossetti Angiulli, PA-C 12/21/2019    I spent a total of 90 minutes on consult- 1 hour with patient, 15 minutes reviewing chart and 15 minutes typing consult.           Revision History      Routing History

## 2020-01-11 NOTE — Progress Notes (Addendum)
Anna Campos, OT  Rehab Admission Coordinator  Physical Medicine and Rehabilitation  PMR Pre-admission  Signed  Date of Service:  12/23/2019  1:40 PM      Related encounter: ED to Hosp-Admission (Discharged) from 12/18/2019 in [facility]      Signed        PMR Admission Coordinator Pre-Admission Assessment   Patient: Anna Campos is an 54 y.o., female MRN: [id number] DOB: 12/13/1965 Height: _0  (175.3 cm) Weight: 55.1 kg                                                                                                                                                  Insurance Information HMO:     PPO:  yes    PCP:      IPA:      80/20:      OTHER:  PRIMARY: [employer] -with [place]      Policy#: [id number]      Subscriber: patient CM Name: Anna Campos      Phone#: [phone]     Fax#: [phone] Pre-Cert#: [id number]      Employer:  Anna Campos provided by Anna Campos for admit to CIR on 3/23. Clinical updates due 3/30. Anna Campos is follow up CM with (p): [phone] (f); [phone] Benefits:  Phone #: online     Name: [email or web address] (benefits & open case) Eff. Date: 10/22/19-10/21/20     Deduct: $2,500 ($913.20 met)      Out of Pocket Max: $5,500 (includes deductible - $913.20 met)    Life Max: NA CIR: 80% coverage, 20% co-insurance      SNF: 80% coverage, 20% co-insurance; limited to 60 days/cal yr Outpatient: $60 per visit co-pay; limited to 30 combined PT/OT/and Chiro, & Anna Campos: 80% coverage, 20% co-insurance; 100 visits with each visit not to exceed 4 hours      DME: 100% coverage, 0% co-insurance Providers:  SECONDARY: None      Policy#:       Subscriber:  CM Name:       Phone#:      Fax#:  Pre-Cert#:       Employer:  Benefits:  Phone #:      Name:  Eff. Date:      Deduct:       Out of Pocket Max:       Life Max:  CIR:       SNF:  Outpatient:      Co-Pay:  Home Health:       Co-Pay:  DME:      Co-Pay:    Medicaid Application Date:        Case Manager:  Disability Application Date:       Case Worker:    The "Data Collection Information Summary" for patients in Inpatient Rehabilitation  Facilities with attached "Privacy Act [place] Records" was provided and verbally reviewed with: N/A   Emergency Contact Information         Contact Information     Name Relation Home Work Mobile    Anna Campos Spouse     [phone]       Current Medical History  Patient Admitting Diagnosis: C5 quadriplegia- incomplete- ASIA C. History of Present Illness: Anna Campos is a 54 year old female with unremarkable past medical history except tobacco abuse on no prescription medications.  Per chart review lives with spouse and 48 year old daughter.  Independent prior to admission.  She works in Therapist, art for [employer].  1 level home 3 steps to entry.  Presented 12/18/2019 with syncope, orthostasis and generalized fatigue as well as reported fall by her family and questionable loss of consciousness.  Admission chemistries and blood culture Streptococcus, alcohol negative, lactic acid 1.8.  Cranial CT scan negative.  Echocardiogram with ejection fraction of 65% without emboli.  CTA of chest abdomen pelvis negative.  CT angiogram of the chest showed small nonocclusive pulmonary emboli bilaterally involving segmental and subsegmental size vessels.  Bilateral lower extremity Dopplers negative.  Patient was placed on heparin therapy.  MRI of the brain no evidence of acute intracranial abnormality.  Infectious disease consulted for streptococcal bacteremia maintained on Rocephin x14 days initiated 12/20/2019.  TEE showed no vegetation.  CT of cervical spine showed prominent spinal cord signal abnormality at the C4-C6 levels.  Involvement of the entire cord cross-section at the lower C5 level.  Edema signal within the interspinous spaces at the C3-T2 levels.  C4-C5 posterior disc osteophyte complex contributes to severe spinal canal stenosis with  spinal cord flattening.  Neurosurgery Dr. Annette Stable consulted and plan would be for surgical intervention with decompression and stabilization after antibiotic therapy completed.  Hospital course complicated by hemoglobin 6.8 from admission hemoglobin 10.7 patient did receive 2 units packed red blood cells 12/25/2019.  Patient occult heme positive.  Gastroenterology services Dr. Cristina Gong consulted underwent EGD 12/27/2019 showing normal-appearing esophagus.  Mild erythematous mucosa in the entire gastric cavity.  2 superficial prepyloric ulcers 1 with clean base and the other with pigmentation.  1 Endo Clip was deployed to close the mucosal defect.  Normal-appearing cardia and fundus.  H. pylori serology positive placed on triple therapy clindamycin amoxicillin PPI 12/30/2019 patient was cleared to resume intravenous heparin and PPI was added twice daily for 2 months.  Her latest hemoglobin was 9.4 on 01/07/2020.  Patient also with multiple dental caries underwent multiple extractions 12/30/2019.  Patient was later cleared for surgical intervention and underwent C3-4, 4-5, 5-6, C6-7 anterior cervical decompression and fusion utilizing interbody cages 01/04/2020 per Dr. Annette Stable.  Cervical brace as directed.  Maintained on a regular diet.  She currently remains on intravenous heparin for pulmonary emboli and awaiting plan to transition to oral agent. Pt has been evaluated by therapies with recommendation for CIR. Pt is to admit to CIR 01/11/20.  Glasgow Coma Scale Score: 15   Past Medical History  Past Medical History:  Diagnosis Date  . PE (pulmonary thromboembolism) ([place]) 12/20/2019  . Syncope 12/20/2019      Family History  family history includes Breast cancer in her mother.   Prior Rehab/Hospitalizations:  Has the patient had prior rehab or hospitalizations prior to admission? No   Has the patient had major surgery during 100 days prior to admission? yes   Current Medications    Current Facility-Administered  Medications:  .  0.9 %  sodium chloride infusion, , Intravenous, PRN, Earnie Larsson, MD, Stopped at 12/31/19 2240 .  0.9 %  sodium chloride infusion, 250 mL, Intravenous, Continuous, Pool, Henry, MD .  acetaminophen (TYLENOL) tablet 650 mg, 650 mg, Oral, Q4H PRN, Earnie Larsson, MD, 650 mg at 12/30/19 2120 .  alum & mag hydroxide-simeth (MAALOX/MYLANTA) 200-200-20 MG/5ML suspension 15 mL, 15 mL, Oral, Q4H PRN, Swayze, Ava, DO .  amoxicillin (AMOXIL) capsule 1,000 mg, 1,000 mg, Oral, Q12H, 1,000 mg at 01/11/20 0907 **AND** clarithromycin (BIAXIN) tablet 500 mg, 500 mg, Oral, Q12H, 500 mg at 01/11/20 0908 **AND** pantoprazole (PROTONIX) EC tablet 40 mg, 40 mg, Oral, BID AC, Pool, Mallie Mussel, MD, 40 mg at 01/11/20 0631 .  benzonatate (TESSALON) capsule 200 mg, 200 mg, Oral, TID PRN, Earnie Larsson, MD, 200 mg at 12/29/19 0756 .  chlorhexidine (PERIDEX) 0.12 % solution 15 mL, 15 mL, Mouth/Throat, BID, Pool, Mallie Mussel, MD, 15 mL at 01/11/20 0908 .  cyclobenzaprine (FLEXERIL) tablet 5 mg, 5 mg, Oral, TID PRN, Swayze, Ava, DO, 5 mg at 01/10/20 2147 .  feeding supplement (ENSURE ENLIVE) (ENSURE ENLIVE) liquid 237 mL, 237 mL, Oral, BID BM, Swayze, Ava, DO, 237 mL at 01/11/20 0908 .  heparin ADULT infusion 100 units/mL (25000 units/28m sodium chloride 0.45%), 650 Units/hr, Intravenous, Continuous, [place], RValeda Malm RPH, Last Rate: 6.5 mL/hr at 01/11/20 1236, 650 Units/hr at 01/11/20 1236 .  HYDROcodone-acetaminophen (NORCO) 10-325 MG per tablet 2 tablet, 2 tablet, Oral, Q4H PRN, PEarnie Larsson MD, 2 tablet at 01/11/20 0908 .  HYDROcodone-acetaminophen (NORCO/VICODIN) 5-325 MG per tablet 1 tablet, 1 tablet, Oral, Q4H PRN, PEarnie Larsson MD, 1 tablet at 01/10/20 2015 .  HYDROmorphone (DILAUDID) injection 1 mg, 1 mg, Intravenous, Q2H PRN, PEarnie Larsson MD .  lactated ringers infusion, , Intravenous, Continuous, PEarnie Larsson MD, Stopped at 01/05/20 0600 .  loratadine (CLARITIN) tablet 10 mg, 10 mg, Oral, Daily, Pool, HMallie Mussel MD, 10  mg at 01/11/20 0908 .  menthol-cetylpyridinium (CEPACOL) lozenge 3 mg, 1 lozenge, Oral, PRN, 3 mg at 01/06/20 1604 **OR** phenol (CHLORASEPTIC) mouth spray 1 spray, 1 spray, Mouth/Throat, PRN, Pool, HMallie Mussel MD .  methocarbamol (ROBAXIN) tablet 500 mg, 500 mg, Oral, Q8H PRN, PEarnie Larsson MD, 500 mg at 01/01/20 0526 .  morphine 2 MG/ML injection 1 mg, 1 mg, Intravenous, Q4H PRN, PEarnie Larsson MD, 1 mg at 01/05/20 0042 .  multivitamin with minerals tablet 1 tablet, 1 tablet, Oral, Daily, PEarnie Larsson MD, 1 tablet at 01/11/20 0908 .  ondansetron (ZOFRAN) tablet 4 mg, 4 mg, Oral, Q6H PRN **OR** ondansetron (ZOFRAN) injection 4 mg, 4 mg, Intravenous, Q6H PRN, PEarnie Larsson MD .  oxyCODONE (Oxy IR/ROXICODONE) immediate release tablet 5-10 mg, 5-10 mg, Oral, Q4H PRN, PEarnie Larsson MD, 10 mg at 01/06/20 1307 .  sodium chloride (OCEAN) 0.65 % nasal spray 1 spray, 1 spray, Each Nare, PRN, Pool, HMallie Mussel MD .  sodium chloride flush (NS) 0.9 % injection 3 mL, 3 mL, Intravenous, Q12H, PEarnie Larsson MD, 3 mL at 01/11/20 0920 .  sodium chloride flush (NS) 0.9 % injection 3 mL, 3 mL, Intravenous, PRN, Pool, Henry, MD .  sodium chloride irrigation 0.9 % 200 mL, 200 mL, Irrigation, Continuous, Pool, Henry, MD, 200 mL at 12/31/19 1044   Patients Current Diet:     Diet Order                      Diet regular Room service appropriate? Yes; Fluid consistency: Thin  Diet effective  now                   Precautions / Restrictions Precautions Precautions: Fall, Cervical Precaution Comments: central cord. Monitor BP - orthostatic hypotension Cervical Brace: Soft collar Restrictions Weight Bearing Restrictions: No    Has the patient had 2 or more falls or a fall with injury in the past year?Yes   Prior Activity Level Community (5-7x/wk): very independent PTA, works from home, has husband and 70 yo Naval architect, drove PTA.    Prior Functional Level Prior Function Level of Independence: Independent Comments: works in  Therapist, art for [employer], works from home, 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   [place]: Did the patient need help bathing, dressing, using the toilet or eating?  Independent   Indoor Mobility: Did the patient need assistance with walking from room to room (with or without device)? Independent   Stairs: Did the patient need assistance with internal or external stairs (with or without device)? Independent   Functional Cognition: Did the patient need help planning regular tasks such as shopping or remembering to take medications? Independent   Home Assistive Devices / Equipment Home Assistive Devices/Equipment: None Home Equipment: None   Prior Device Use: Indicate devices/aids used by the patient prior to current illness, exacerbation or injury? None of the above   Current Functional Level Cognition   Overall Cognitive Status: Within Functional Limits for tasks assessed Orientation Level: Oriented X4 General Comments: Pt extremely motivated and willing to participate in therapy.     Extremity Assessment (includes Sensation/Coordination)   Upper Extremity Assessment: Generalized weakness, RUE deficits/detail, LUE deficits/detail RUE Deficits / Details: RUE AROM shld flex, elbow flex/ext, supination/pronation, and wrist flex/ext WFL, however decreased coordination of movements. Minimal movement for digit flex/ext.  RUE Sensation: (decreased but pt states it is improving; "tingling" present) RUE Coordination: decreased fine motor, decreased gross motor LUE Deficits / Details: RUE AROM shld flex, elbow flex/ext, supination/pronation, and wrist flex/ext WFL, however decreased coordination of movements. Minimal movement for digit flex/ext.  LUE Sensation: decreased light touch LUE Coordination: decreased fine motor, decreased gross motor  Lower Extremity Assessment: Defer to PT evaluation RLE Deficits / Details: Hip flexion 2+/5, knee extension 4/5, ankle dorsiflexion 2/5 RLE  Sensation: decreased proprioception RLE Coordination: decreased fine motor, decreased gross motor LLE Deficits / Details: Hip flexion 3+/5, knee extension 4/5, ankle dorsiflexion 5/5 LLE Sensation: decreased proprioception LLE Coordination: decreased fine motor, decreased gross motor     ADLs   Overall ADL's : Needs assistance/impaired Eating/Feeding: Set up, Supervision/ safety, Minimal assistance, Sitting Eating/Feeding Details (indicate cue type and reason): While seated in bedside chair. Pt initially finger feeding with left hand noting 4+ drops. Pt utilized built up utensil in right hand with gross overhand grasp. Pt loaded utensil with left hand, able to bring to mouth with right only. Noted 3 drops of the utensil with the right hand. Pt continues to overcompensate with shld to bring food to mouth. Focused on decreasing shld movement and promoting use of elbow flex/ext for task. Worked on repetition of task with pt requiring mod to max verbal and tactile cues to attempt to use elbow only. Pt mod fatigued following.  Grooming: Wash/dry hands, Wash/dry face, Set up, Supervision/safety, Sitting Grooming Details (indicate cue type and reason): Bed level ADLs completed due to increased pain in RUE (shoulder) mod A and education to appropriately position hands to increase grasp (tenodesis) Upper Body Bathing: Moderate assistance, Sitting Lower Body Bathing: Maximal  assistance, Sit to/from stand Lower Body Bathing Details (indicate cue type and reason): Pt incontinent of urine in standing. Pt able to wash tops of thighs in sitting requiring assist to reach rest of legs and peri area in standing.  Upper Body Dressing : Moderate assistance, Bed level, Sitting Upper Body Dressing Details (indicate cue type and reason): donning gown Lower Body Dressing: Total assistance, Sitting/lateral leans Lower Body Dressing Details (indicate cue type and reason): To don/doff socks. Pt unable to complete figure four  position at this time.  Toilet Transfer: Moderate assistance, +2 for physical assistance, +2 for safety/equipment Toilet Transfer Details (indicate cue type and reason): simulated with transfer to [place] and Hygiene: Total assistance Toileting - Clothing Manipulation Details (indicate cue type and reason): peri care performed in supine at bed level and also in stedy with sit<>stand Functional mobility during ADLs: Moderate assistance, +2 for physical assistance General ADL Comments: Pt demonstrating increased BUE strength, ROM, and coordination as well as independence during self-feeding task.      Mobility   Overal bed mobility: Needs Assistance Bed Mobility: Rolling Rolling: Min guard Sidelying to sit: Mod assist Supine to sit: +2 for safety/equipment, Min assist, Mod assist, HOB elevated Sit to supine: Max assist Sit to sidelying: Mod assist General bed mobility comments: MIn assistance to rise into sitting and advance LEs to edge of bed.  Pt required increased time and use of bed pad to advance hips forward.     Transfers   Overall transfer level: Needs assistance Equipment used: 4-wheeled walker(EVA walker) Transfer via Lift Equipment: Stedy Transfers: Sit to/from Stand Sit to Stand: Mod assist, +2 safety/equipment Stand pivot transfers: Mod assist, +2 physical assistance, +2 safety/equipment General transfer comment: Cues for forward weight shifting and assistance to boost into standing.     Ambulation / Gait / Stairs / Wheelchair Mobility   Ambulation/Gait Ambulation/Gait assistance: Max assist, +2 safety/equipment Gait Distance (Feet): 20 Feet Assistive device: 4-wheeled walker(EVA walker) Gait Pattern/deviations: Step-through pattern, Ataxic, Narrow base of support, Trunk flexed, Decreased dorsiflexion - right General Gait Details: Pt with R knee hyper extension in stance phase.  Required assistance to advance EVA walker forward and cues  for sequencing.  Close chair follow for safety. Gait velocity: dec     Posture / Balance Dynamic Sitting Balance Sitting balance - Comments: Pt tolerated sitting EOB ~10 min with variable supervision to min assist. Noted occasional retro lean with pt requiring verbal and tactile cues to maintain neutral alignment.  Balance Overall balance assessment: Needs assistance Sitting-balance support: Feet supported Sitting balance-Leahy Scale: Fair Sitting balance - Comments: Pt tolerated sitting EOB ~10 min with variable supervision to min assist. Noted occasional retro lean with pt requiring verbal and tactile cues to maintain neutral alignment.  Postural control: Posterior lean Standing balance support: During functional activity, Bilateral upper extremity supported Standing balance-Leahy Scale: Poor Standing balance comment: Requires external support     Special needs/care consideration BiPAP/CPAP: no CPM: no Continuous Drip IV: heparin ADULT infusion 100 units/mL,  Dialysis: no        Days: no Life Vest: no Oxygen: no, on RA Special Bed: no Trach Size:no Wound Vac (area): no      Location: no Skin: abrasion to right face, surgical incision to neck     Bowel mgmt: last BM 01/07/20, incontinent Bladder mgmt: external catheter in place, incontinent Diabetic mgmt: no Behavioral consideration: no Chemo/radiation : no        Previous Home Environment (  from acute therapy documentation) Living Arrangements: Spouse/significant other Available Help at Discharge: Family, Available 24 hours/day Type of Home: House Home Layout: One level Home Access: Stairs to enter Entrance Stairs-Rails: Right Entrance Stairs-Number of Steps: 3-4 Bathroom Shower/Tub: Tub/shower unit, Architectural technologist: Standard Home Care Services: No Additional Comments: Spouse home by 3pm daily   Discharge Living Setting Plans for Discharge Living Setting: Patient's home, Lives with (comment)(pt lives with  husband and 40 yo daughter) Type of Home at Discharge: House Discharge Home Layout: One level Discharge Home Access: Stairs to enter(willing to start on ramp) Entrance Stairs-Rails: None Entrance Stairs-Number of Steps: 3 Discharge Bathroom Shower/Tub: Tub/shower unit Discharge Bathroom Toilet: Standard Discharge Bathroom Accessibility: Yes How Accessible: Accessible via walker(unsure about wc... (definitely not into commode area)) Does the patient have any problems obtaining your medications?: No   Social/Family/Support Systems Patient Roles: Spouse, Parent Contact Information: Redina Zeller (spouse): [phone]; Yaritzi Craun (brother in law): [phone]; Earlene Plater (sister): [phone] Anticipated Caregiver: husband + sister + family/friends (per pt and husband, the husband works during the day so her sister, and Jerry Caras will be her main daytime caregiver). Still a work in progress in terms of solidifying 24/7 A.  Anticipated Caregiver's Contact Information: see above Ability/Limitations of Caregiver: Min A Caregiver Availability: 24/7 (planning in place) Discharge Plan Discussed with Primary Caregiver: Yes(with pt and husband) Is Caregiver In Agreement with Plan?: Yes Does Caregiver/Family have Issues with Lodging/Transportation while Pt is in Rehab?: No     Goals/Additional Needs Patient/Family Goal for Rehab: PT/OT: Supervision/Min A; SLP: NA Expected length of stay: 21-24 days Cultural Considerations: NA Dietary Needs: regular diet, thin liquids  Equipment Needs: slideboard, tilt in space, etc Special Service Needs: SCI team  Pt/Family Agrees to Admission and willing to participate: Yes Program Orientation Provided & Reviewed with Pt/Caregiver Including Roles  & Responsibilities: Yes(pt and her husband)  Barriers to Discharge: Home environment access/layout, IV antibiotics, Incontinence, Neurogenic Bowel & Bladder  Barriers to Discharge Comments: steps to enter house  (family willing to put in ramp), unsure of bathroom accessibility; new SCI with bowel incontinence. presents as an incomplete quadriplegic      Decrease burden of Care through IP rehab admission: NA     Possible need for SNF placement upon discharge:Not anticipated. Pt has great social support from family and family is willing to make home as accessible as possible. They are aware of plan for rehab then home.      Patient Condition: This patient's medical and functional status has changed since the consult dated: 12/21/19 in which the Rehabilitation Physician determined and documented that the patient's condition is appropriate for intensive rehabilitative care in an inpatient rehabilitation facility. See "History of Present Illness" (above) for medical update. Functional changes are: improvement in bed mobility from Max A +2 to Min/Mod A +2, improvement in transfers from Total A +2 to Mod A +2, and improvement from no gait to gait training at Max A +2 20 feet with EVA walker; pt has progressed from Max A for self feeding and total A for grooming to supervision/MIn A for self feeding, supervision for grooming. Patient's medical and functional status update has been discussed with the Rehabilitation physician and patient remains appropriate for inpatient rehabilitation. Will admit to inpatient rehab today.   Preadmission Screen Completed By:  Anna Campos, OT, 01/11/2020 1:27 PM ______________________________________________________________________   Discussed status with Dr. Dagoberto Ligas on 3/23/21at 1:24PM and received approval for admission today.   Admission Coordinator:  Anna Campos,  time 1:24PM/Date 01/11/20         Cosigned by: Courtney Heys, MD at 01/11/2020  1:29 PM  Revision History

## 2020-01-11 NOTE — Discharge Summary (Addendum)
Physician Discharge Summary  Anna Campos BPZ:[id number] DOB: 1966-01-09 DOA: 12/18/2019  PCP: London Pepper, MD  Admit date: 12/18/2019 Discharge date: 01/11/2020  Recommendations for Outpatient Follow-up:  1. Discharge to Inpatient rehab 2. Follow up with PCP in 7-10 days after discharge fro rehab 3. Follow up with neurosurgery as directed 4. Wear C-Collar as per neurosurgery's direction 5. Follow up with dentistry as directed.  Follow-up Information    Lenn Cal, DDS. Call on 01/10/2020.   Specialty: Dentistry Why: Call for follow up evaluation of healing once discharged from hospital  Contact information: [address] [place] [address] [phone]          Discharge Diagnoses: Principal diagnosis is #1 1. Hypovolemic shock and syncope 2. Sepsis due to bacteremia 3. Pulmonary embolus 4. Strep constellatus bacteremia 5. Dental caries 6. Incomplete spinal cord injury C3-C6 with distal lower extremity weakness. 7. S/P C3-4, C4-5, C5-6, C6-7 anterior cervical decompression and fusion with cage and plate placement on 0/86/7619. Cleared for rehab by neurosurgery. 8. Bilateral lower extremity and upper extremity weakness 9. Acute normocytic anemia/GI Bleed/Acute blood loss anemia 10. Moderate protein calorie malnutrition  Discharge Condition: Fair  Disposition: Inpatient rehab  Diet recommendation: Heart healthy  Filed Weights   01/06/20 0500 01/07/20 0500 01/09/20 0500  Weight: 60.5 kg 60.1 kg 55.1 kg    History of present illness: 54 year old woman with no significant past medical history presenting with syncope.  She has been having generalized fatigue.  She has had poor p.o. intake and has not eaten in the last day.  She stood up this evening and passed out.  Family heard her fall.  She did hit her head.  She was told she was not unconscious for a long period of time.  She was noted by EMS to be hypotensive and she was given a small fluid bolus.   Presently she complains of feeling pain all over her body.  She admits to using marijuana today.  She does not take any other medications other than a multivitamin.  She lives with her husband.  She denies that he takes any medications either.  She does not think that she could've inadvertently taken any antihypertensives.  She did recently have tooth pain and had taken some Tylenol with codeine several days ago.  None since.  She denies fevers or chills.  No sick contacts.  No dyspnea.  She notes that she had a loose stool the day before and none since.  No nausea or vomiting.  She feels lightheaded and has a headache.  Does not have vertigo.  She has diffuse myalgias and paresthesias.   Hospital Course:  Anna Campos a 54 year old female with no significant past medical history presenting with syncope. She has been having generalized fatigue. She has had poor p.o. intake and has not eaten in the last day. She stood up this evening and passed out. Family heard her fall. She did hit her head. She was told she was not unconscious for a long period of time. She was noted by EMS to be hypotensive and she was given a small fluid bolus. She was admitted to ICU due to syncope, hypovolemic shock. Due to EKG changes, cardiology was consulted. Patient was weaned off Levophed and transferred to [facility] hospitalist service on 3/1. She was found to have Streptococcus bacteremia. Infectious disease consulted and recommended 2 weeks of ceftriaxone. She has completed this course. Due to bacteremia, dentistry was also consulted. Patient underwent dental extraction on 3/11.  Patient developed anemia during her hospitalization, GI consulted and patient underwent EGD on 3/8. EGD demonstrated normal esophagus, erythematous gastric mucosa with 2 non-bleeding ulcers. One with pigmented material. One endoclip was placed. The patient was also found to have erythematous duodenopathy. Recommendation is for protonix 40  mg bid x 2 months.   The patient has had bilateral extremity weakness with incomplete spinal cord injury C3-C6 documented on MRI. Neurosurgery was consulted. Weakness of distal extremities worse than proximal. The patient underwent C3-4,C4-5, C5-6, C6-7 anterior surgical decompression and fusion with cages and plate on 1/61/0960. She has tolerated the procedure well and is working with PT/OT. Recommendation is for CIR.  Heparin gtt was restarted on 01/07/2020. It was converted to Eliquis on 01/11/2020.  The patient has been cleared for discharge to CIR by neurosurgery. She will be discharged to rehab today.  Today's assessment: S: The patient is resting comfortably. No new complaints. O: Vitals:  Vitals:   01/11/20 0743 01/11/20 1130  BP: 99/68 100/79  Pulse: 88 91  Resp: 12 20  Temp: 98.5 F (36.9 C) 98.6 F (37 C)  SpO2: 100% 100%   Exam:  Constitutional:  The patient is awake, alert, and oriented x 3. No acute distress. Respiratory:   No increased work of breathing.  No wheezes, rales, or rhonchi  No tactile fremitus Cardiovascular:   Regular rate and rhythm  No murmurs, ectopy, or gallups.  No lateral PMI. No thrills. Abdomen:   Abdomen is soft, non-tender, non-distended  No hernias, masses, or organomegaly  Normoactive bowel sounds.  Musculoskeletal:   No cyanosis, clubbing, or edema Skin:   No rashes, lesions, ulcers  palpation of skin: no induration or nodules Neurologic:   CN 2-12 intact  Sensation all 4 extremities intact  Moving lower extremities. Psychiatric:   Mental status ? Mood, affect appropriate ? Orientation to person, place, time   judgment and insight appear intact  I have personally reviewed the following:   Discharge Instructions  Discharge Instructions    Activity as tolerated - No restrictions   Complete by: As directed    Call MD for:  redness, tenderness, or signs of infection (pain, swelling, redness, odor or  green/yellow discharge around incision site)   Complete by: As directed    Call MD for:  severe uncontrolled pain   Complete by: As directed    Call MD for:  temperature >100.4   Complete by: As directed    Diet - low sodium heart healthy   Complete by: As directed    Discharge instructions   Complete by: As directed    Discharge to CIR Follow up with neurosurgery as directed Follow up with PCP in 7-10 days after discharge from rehab Wear C collar as directed by neurosurgery   Increase activity slowly   Complete by: As directed      Allergies as of 01/11/2020      Reactions   Ampicillin Diarrhea   Chlorhexidine       Medication List    STOP taking these medications   naproxen sodium 220 MG tablet Commonly known as: ALEVE     TAKE these medications   amoxicillin 500 MG capsule Commonly known as: AMOXIL Take 2 capsules (1,000 mg total) by mouth every 12 (twelve) hours for 2 days.   apixaban 5 MG Tabs tablet Commonly known as: ELIQUIS Take 2 tablets (10 mg total) by mouth 2 (two) times daily for 7 days.   apixaban 5 MG Tabs tablet Commonly  known as: ELIQUIS Take 1 tablet (5 mg total) by mouth 2 (two) times daily. Start taking on: January 18, 2020   chlorhexidine 0.12 % solution Commonly known as: PERIDEX Use as directed 15 mLs in the mouth or throat 2 (two) times daily.   cyclobenzaprine 5 MG tablet Commonly known as: FLEXERIL Take 1 tablet (5 mg total) by mouth 3 (three) times daily as needed for muscle spasms.   feeding supplement (ENSURE ENLIVE) Liqd Take 237 mLs by mouth 2 (two) times daily between meals. Start taking on: January 12, 2020   HYDROcodone-acetaminophen 10-325 MG tablet Commonly known as: NORCO Take 1-2 tablets by mouth every 4 (four) hours as needed for severe pain ((score 7 to 10)).   loratadine 10 MG tablet Commonly known as: CLARITIN Take 1 tablet (10 mg total) by mouth daily. Start taking on: January 12, 2020   multivitamin with minerals  Tabs tablet Take 1 tablet by mouth daily.   pantoprazole 40 MG tablet Commonly known as: PROTONIX Take 1 tablet (40 mg total) by mouth 2 (two) times daily before a meal for 2 days.   sodium chloride 0.65 % Soln nasal spray Commonly known as: OCEAN Place 1 spray into both nostrils as needed for congestion.            Durable Medical Equipment  (From admission, onward)         Start     Ordered   01/11/20 0953  For home use only DME Other see comment  Once    Comments: Right hinged knee brace for hyperextension during stance phase gait  Question:  Length of Need  Answer:  Lifetime   01/11/20 0953          Allergies  Allergen Reactions  . Ampicillin Diarrhea  . Chlorhexidine     The results of significant diagnostics from this hospitalization (including imaging, microbiology, ancillary and laboratory) are listed below for reference.    Significant Diagnostic Studies: DG Orthopantogram  Result Date: 12/24/2019 CLINICAL DATA:  Poor dentition EXAM: ORTHOPANTOGRAM/PANORAMIC COMPARISON:  None. FINDINGS: Maxillary multiple missing and mandibular teeth. Numerous dental caries. Lucency noted around many of the lower remaining teeth. No acute mandibular fracture. IMPRESSION: Numerous dental caries. Mild lucency around many of the remaining lower teeth. Electronically Signed   By: Charlett Nose M.D.   On: 12/24/2019 09:16   DG Cervical Spine 2-3 Views  Result Date: 01/04/2020 CLINICAL DATA:  Cervical decompression and fusion EXAM: CERVICAL SPINE - 2-3 VIEW; DG C-ARM 1-60 MIN COMPARISON:  MRI 12/20/2019 FINDINGS: Two low resolution intraoperative spot views of the cervical spine. Total fluoroscopy time was 5 seconds. The images demonstrate anterior plate and fixating screws extending from C3 through C7 with interbody devices at C3-C4, C4-C5, C5-C6 and C6-C7. A linear lead or monitoring device projects anterior to C2. IMPRESSION: Intraoperative fluoroscopic assistance provided during  cervical spine surgery Electronically Signed   By: Jasmine Pang M.D.   On: 01/04/2020 19:30   CT Head Wo Contrast  Result Date: 12/18/2019 CLINICAL DATA:  Syncope, somnolent EXAM: CT HEAD WITHOUT CONTRAST TECHNIQUE: Contiguous axial images were obtained from the base of the skull through the vertex without intravenous contrast. COMPARISON:  None. FINDINGS: Brain: No acute infarct or hemorrhage. Lateral ventricles and midline structures are unremarkable. No acute extra-axial fluid collections. No mass effect. Vascular: No hyperdense vessel or unexpected calcification. Skull: Normal. Negative for fracture or focal lesion. Sinuses/Orbits: No acute finding. Other: None IMPRESSION: 1. No acute intracranial process. Electronically  Signed   By: Sharlet Salina M.D.   On: 12/18/2019 23:22   CT Chest W Contrast  Result Date: 12/18/2019 CLINICAL DATA:  Chest trauma. EXAM: CT CHEST, ABDOMEN, AND PELVIS WITH CONTRAST TECHNIQUE: Multidetector CT imaging of the chest, abdomen and pelvis was performed following the standard protocol during bolus administration of intravenous contrast. CONTRAST:  OMNIPAQUE IOHEXOL 350 MG/ML SOLN COMPARISON:  None. FINDINGS: CT CHEST FINDINGS Cardiovascular: There is no large centrally located pulmonary embolism. No evidence for a thoracic aortic aneurysm or dissection. The visualized arch vessels are grossly patent. Minimal atherosclerotic changes are noted of the thoracic aorta. Mediastinum/Nodes: --No mediastinal or hilar lymphadenopathy. --No axillary lymphadenopathy. --No supraclavicular lymphadenopathy. --Normal thyroid gland. --The esophagus is unremarkable Lungs/Pleura: No pulmonary nodules or masses. No pleural effusion or pneumothorax. No focal airspace consolidation. No focal pleural abnormality. Musculoskeletal: No chest wall abnormality. No acute or significant osseous findings. CT ABDOMEN PELVIS FINDINGS Hepatobiliary: The liver is normal. Normal gallbladder.There is no  biliary ductal dilation. Pancreas: Normal contours without ductal dilatation. No peripancreatic fluid collection. Spleen: No splenic laceration or hematoma. Adrenals/Urinary Tract: --Adrenal glands: No adrenal hemorrhage. --Right kidney/ureter: No hydronephrosis or perinephric hematoma. --Left kidney/ureter: No hydronephrosis or perinephric hematoma. --Urinary bladder: Unremarkable. Stomach/Bowel: --Stomach/Duodenum: No hiatal hernia or other gastric abnormality. Normal duodenal course and caliber. --Small bowel: No dilatation or inflammation. --Colon: No focal abnormality. --Appendix: Normal. Vascular/Lymphatic: Normal course and caliber of the major abdominal vessels. --No retroperitoneal lymphadenopathy. --No mesenteric lymphadenopathy. --No pelvic or inguinal lymphadenopathy. Reproductive: Unremarkable Other: No ascites or free air. The abdominal wall is normal. Musculoskeletal. No acute displaced fractures. IMPRESSION: 1. No acute finding of the chest, abdomen or pelvis. 2. Minimal atherosclerotic changes of the thoracic aorta. Electronically Signed   By: Katherine Mantle M.D.   On: 12/18/2019 23:25   CT ANGIO CHEST PE W OR WO CONTRAST  Result Date: 12/20/2019 CLINICAL DATA:  54 year old female with history of syncope. Elevated D-dimer. Chest pain. EXAM: CT ANGIOGRAPHY CHEST WITH CONTRAST TECHNIQUE: Multidetector CT imaging of the chest was performed using the standard protocol during bolus administration of intravenous contrast. Multiplanar CT image reconstructions and MIPs were obtained to evaluate the vascular anatomy. CONTRAST:  80mL OMNIPAQUE IOHEXOL 350 MG/ML SOLN COMPARISON:  Chest CT 12/18/2019. FINDINGS: Cardiovascular: There are small filling defects within the pulmonary arteries bilaterally involving segmental sized branches in the left lower lobe and subsegmental sized branches in the right lower lobe. These are nonocclusive. No larger central or lobar sized filling defects are noted. Heart  size is normal. There is no significant pericardial fluid, thickening or pericardial calcification. There is aortic atherosclerosis, as well as atherosclerosis of the great vessels of the mediastinum and the coronary arteries, including calcified atherosclerotic plaque in the left anterior descending coronary artery. Mediastinum/Nodes: No pathologically enlarged mediastinal or hilar lymph nodes. Esophagus is unremarkable in appearance. No axillary lymphadenopathy. Lungs/Pleura: Dependent areas of subsegmental atelectasis are noted in lower lobes of the lungs bilaterally. No acute consolidative airspace disease. No pleural effusions. Upper Abdomen: Intermediate attenuation material lying dependently in the gallbladder, presumably biliary sludge. Musculoskeletal: There are no aggressive appearing lytic or blastic lesions noted in the visualized portions of the skeleton. Review of the MIP images confirms the above findings. IMPRESSION: 1. Small nonocclusive pulmonary emboli bilaterally involving segmental and subsegmental sized vessels, as above. 2. Aortic atherosclerosis, in addition to left anterior descending coronary artery disease. Please note that although the presence of coronary artery calcium documents the presence of coronary artery  disease, the severity of this disease and any potential stenosis cannot be assessed on this non-gated CT examination. Assessment for potential risk factor modification, dietary therapy or pharmacologic therapy may be warranted, if clinically indicated. 3. Biliary sludge in the gallbladder. These results will be called to the ordering clinician or representative by the Radiologist Assistant, and communication documented in the PACS or zVision Dashboard. Aortic Atherosclerosis (ICD10-I70.0). Electronically Signed   By: Trudie Reed M.D.   On: 12/20/2019 10:53   CT Cervical Spine Wo Contrast  Result Date: 12/18/2019 CLINICAL DATA:  Syncope, somnolent EXAM: CT CERVICAL SPINE  WITHOUT CONTRAST TECHNIQUE: Multidetector CT imaging of the cervical spine was performed without intravenous contrast. Multiplanar CT image reconstructions were also generated. COMPARISON:  None. FINDINGS: Alignment: Alignment is anatomic. Skull base and vertebrae: No acute displaced fractures. Soft tissues and spinal canal: No prevertebral fluid or swelling. No visible canal hematoma. Disc levels: There is extensive multilevel cervical spondylosis with disc space narrowing and circumferential osteophyte formation seen at C3-4, C4-5, C5-6, and C6-7. There is symmetrical neural foraminal encroachment at these levels. Upper chest: Airway is patent.  Lung apices are clear. Other: Reconstructed images demonstrate no additional findings. IMPRESSION: 1. Extensive multilevel cervical spondylosis.  No acute fracture. Electronically Signed   By: Sharlet Salina M.D.   On: 12/18/2019 23:23   MR BRAIN WO CONTRAST  Result Date: 12/20/2019 CLINICAL DATA:  Neuro deficit, subacute. Additional history provided: Syncope, generalized fatigue, poor p.o. intake. EXAM: MRI HEAD WITHOUT CONTRAST TECHNIQUE: Multiplanar, multiecho pulse sequences of the brain and surrounding structures were obtained without intravenous contrast. COMPARISON:  Head CT 12/18/2019. FINDINGS: Brain: The examination is intermittently motion degraded. Most notably there is mild-to-moderate motion degradation of the axial T2 FLAIR sequence and moderate/severe motion degradation of the coronal T2 weighted sequence. There is no evidence of acute infarct. No evidence of intracranial mass. No midline shift or extra-axial fluid collection. No chronic intracranial blood products. Mild scattered T2/FLAIR hyperintensity within the cerebral white matter is nonspecific, but most commonly seen on the basis of chronic small vessel ischemic disease. Mild generalized parenchymal atrophy. Incidentally noted cavum septum pellucidum and cavum vergae. Vascular: Visualized orbits  demonstrate no acute abnormality. Skull and upper cervical spine: No focal marrow lesion. Sinuses/Orbits: Visualized orbits demonstrate no acute abnormality. Mild scattered paranasal sinus mucosal thickening greatest within bilateral ethmoid air cells. No significant mastoid effusion. IMPRESSION: 1. Intermittently motion degraded exam. 2. No evidence of acute intracranial abnormality. 3. Mild scattered T2 hyperintense signal changes within the cerebral white matter are nonspecific, but most commonly seen on the basis of chronic small vessel ischemic disease. 4. Mild generalized parenchymal atrophy. 5. Mild paranasal sinus mucosal thickening. Electronically Signed   By: Jackey Loge DO   On: 12/20/2019 16:57   MR CERVICAL SPINE WO CONTRAST  Result Date: 12/20/2019 CLINICAL DATA:  Spinal stenosis, cervical spine. EXAM: MRI CERVICAL SPINE WITHOUT CONTRAST TECHNIQUE: Multiplanar, multisequence MR imaging of the cervical spine was performed. No intravenous contrast was administered. COMPARISON:  CT cervical spine 12/18/2019. FINDINGS: Mildly motion degraded examination Alignment: Mild reversal of the expected cervical lordosis. No significant spondylolisthesis. Vertebrae: Vertebral body height is maintained. No significant marrow edema or suspicious osseous lesion. Multilevel degenerative endplate irregularity and mixed degenerative endplate marrow signal. This includes mild degenerative endplate edema at V8-L3. Cord: There is prominent T2/STIR hyperintense cord signal abnormality at the C4 through C6 levels. The cord signal abnormality involves the entire cord at the lower C5 level. Posterior Fossa, vertebral  arteries, paraspinal tissues: No abnormality identified within included portions of the posterior fossa. Flow voids preserved within the cervical vertebral arteries. There is STIR hyperintense signal within the interspinous spaces at the C3-C4 through T1-T2 levels. Disc levels: Moderate/severe C3-C4 disc  degeneration. Moderate disc degeneration at the remaining cervical levels. C2-C3: No disc herniation. No significant canal or foraminal stenosis. C3-C4: Posterior disc osteophyte complex. Uncinate/facet hypertrophy. Mild spinal canal stenosis. There is contact upon the ventral spinal cord with minimal flattening of the ventral cord. Bilateral neural foraminal narrowing (moderate right, moderate/severe left). C4-C5: Posterior disc osteophyte complex. Uncinate/facet hypertrophy. Severe spinal canal stenosis with mild spinal cord flattening. Bilateral neural foraminal narrowing (moderate right, severe left). C5-C6: Posterior disc osteophyte complex. Uncinate/facet hypertrophy. Mild spinal canal stenosis. Severe bilateral neural foraminal narrowing. C6-C7: Posterior disc osteophyte complex. Uncinate/facet hypertrophy. Mild spinal canal stenosis. Severe bilateral neural foraminal narrowing. C7-T1: No disc herniation. No significant canal or foraminal stenosis. IMPRESSION: Prominent spinal cord signal abnormality at the C4 through C6 levels. There is involvement of the entire cord cross-section at the lower C5 level. Differential considerations include spinal cord infarct, contusion, demyelinating disease or other inflammatory/infectious process. Edema signal within the interspinous spaces at the C3-T2 levels, which may reflect interspinous ligament injury. Cervical spondylosis as described and most notably as follows. At C4-C5, a posterior disc osteophyte complex contributes to severe spinal canal stenosis with mild spinal cord flattening. Bilateral neural foraminal narrowing (moderate right, severe left) No more than mild spinal canal stenosis at the remaining levels. Additional sites of neural foraminal narrowing, including site of severe and moderate/severe neural foraminal narrowing as detailed. Electronically Signed   By: Jackey Loge DO   On: 12/20/2019 17:32   MR THORACIC SPINE WO CONTRAST  Result Date:  12/20/2019 CLINICAL DATA:  Distal greater than proximal weakness, trauma/fall at home prior to admission. EXAM: MRI THORACIC SPINE WITHOUT CONTRAST TECHNIQUE: Multiplanar, multisequence MR imaging of the thoracic spine was performed. No intravenous contrast was administered. COMPARISON:  CT chest 12/18/2019 FINDINGS: Mildly motion degraded examination. Alignment:  Alignment is maintained. Vertebrae: Vertebral body height is maintained. No significant marrow edema or suspicious osseous lesion. Cord:  No spinal cord signal abnormality is identified. Paraspinal and other soft tissues: Mild nonspecific edema within the dorsal subcutaneous soft tissues overlying the lower thoracic and visualized upper lumbar spine. No abnormality identified within included portions of the thorax or upper abdomen. Disc levels: Mild disc degeneration throughout the thoracic spine. Mild multilevel disc bulges and facet arthrosis/ligamentum flavum hypertrophy. No significant spinal canal stenosis at any level. No compressive foraminal stenosis. IMPRESSION: Mildly motion degraded examination. No spinal cord signal abnormality is identified. Mild thoracic spondylosis with no significant spinal canal stenosis, and no compressive foraminal narrowing. Electronically Signed   By: Jackey Loge DO   On: 12/20/2019 17:59   CT ABDOMEN PELVIS W CONTRAST  Result Date: 12/18/2019 CLINICAL DATA:  Chest trauma. EXAM: CT CHEST, ABDOMEN, AND PELVIS WITH CONTRAST TECHNIQUE: Multidetector CT imaging of the chest, abdomen and pelvis was performed following the standard protocol during bolus administration of intravenous contrast. CONTRAST:  OMNIPAQUE IOHEXOL 350 MG/ML SOLN COMPARISON:  None. FINDINGS: CT CHEST FINDINGS Cardiovascular: There is no large centrally located pulmonary embolism. No evidence for a thoracic aortic aneurysm or dissection. The visualized arch vessels are grossly patent. Minimal atherosclerotic changes are noted of the thoracic  aorta. Mediastinum/Nodes: --No mediastinal or hilar lymphadenopathy. --No axillary lymphadenopathy. --No supraclavicular lymphadenopathy. --Normal thyroid gland. --The esophagus is unremarkable Lungs/Pleura: No  pulmonary nodules or masses. No pleural effusion or pneumothorax. No focal airspace consolidation. No focal pleural abnormality. Musculoskeletal: No chest wall abnormality. No acute or significant osseous findings. CT ABDOMEN PELVIS FINDINGS Hepatobiliary: The liver is normal. Normal gallbladder.There is no biliary ductal dilation. Pancreas: Normal contours without ductal dilatation. No peripancreatic fluid collection. Spleen: No splenic laceration or hematoma. Adrenals/Urinary Tract: --Adrenal glands: No adrenal hemorrhage. --Right kidney/ureter: No hydronephrosis or perinephric hematoma. --Left kidney/ureter: No hydronephrosis or perinephric hematoma. --Urinary bladder: Unremarkable. Stomach/Bowel: --Stomach/Duodenum: No hiatal hernia or other gastric abnormality. Normal duodenal course and caliber. --Small bowel: No dilatation or inflammation. --Colon: No focal abnormality. --Appendix: Normal. Vascular/Lymphatic: Normal course and caliber of the major abdominal vessels. --No retroperitoneal lymphadenopathy. --No mesenteric lymphadenopathy. --No pelvic or inguinal lymphadenopathy. Reproductive: Unremarkable Other: No ascites or free air. The abdominal wall is normal. Musculoskeletal. No acute displaced fractures. IMPRESSION: 1. No acute finding of the chest, abdomen or pelvis. 2. Minimal atherosclerotic changes of the thoracic aorta. Electronically Signed   By: Katherine Mantle M.D.   On: 12/18/2019 23:25   DG Pelvis Portable  Result Date: 12/18/2019 CLINICAL DATA:  Syncope, fell EXAM: PORTABLE PELVIS 1-2 VIEWS COMPARISON:  None. FINDINGS: Supine frontal view of the pelvis was performed. There are no acute displaced fractures. The hips are well aligned. Joint spaces are well preserved. Sacroiliac  joints are normal. IMPRESSION: 1. No acute displaced fracture. Electronically Signed   By: Sharlet Salina M.D.   On: 12/18/2019 22:47   DG Chest Portable 1 View  Result Date: 12/18/2019 CLINICAL DATA:  Syncope, fell EXAM: PORTABLE CHEST 1 VIEW COMPARISON:  None. FINDINGS: The heart size and mediastinal contours are within normal limits. Both lungs are clear. The visualized skeletal structures are unremarkable. IMPRESSION: No active disease. Electronically Signed   By: Sharlet Salina M.D.   On: 12/18/2019 22:47   DG C-Arm 1-60 Min  Result Date: 01/04/2020 CLINICAL DATA:  Cervical decompression and fusion EXAM: CERVICAL SPINE - 2-3 VIEW; DG C-ARM 1-60 MIN COMPARISON:  MRI 12/20/2019 FINDINGS: Two low resolution intraoperative spot views of the cervical spine. Total fluoroscopy time was 5 seconds. The images demonstrate anterior plate and fixating screws extending from C3 through C7 with interbody devices at C3-C4, C4-C5, C5-C6 and C6-C7. A linear lead or monitoring device projects anterior to C2. IMPRESSION: Intraoperative fluoroscopic assistance provided during cervical spine surgery Electronically Signed   By: Jasmine Pang M.D.   On: 01/04/2020 19:30   ECHOCARDIOGRAM COMPLETE  Result Date: 12/19/2019    ECHOCARDIOGRAM REPORT   Patient Name:   Anna Campos Date of Exam: 12/19/2019 Medical Rec #:  [id number]   Height:       67.0 in Accession #:    [id number]  Weight:       131.4 lb Date of Birth:  1966/05/22  BSA:          1.692 m Patient Age:    53 years    BP:           90/50 mmHg Patient Gender: F           HR:           77 bpm. Exam Location:  Inpatient Procedure: 2D Echo Indications:    syncope 780.2  History:        Patient has no prior history of Echocardiogram examinations.  Sonographer:    Delcie Roch Referring Phys: [id number] JENNIFER T KRALL IMPRESSIONS  1. Left ventricular ejection fraction, by estimation, is 60 to 65%.  The left ventricle has normal function. The left ventricle has no  regional wall motion abnormalities. Left ventricular diastolic parameters were normal.  2. Right ventricular systolic function is normal. The right ventricular size is normal. Tricuspid regurgitation signal is inadequate for assessing PA pressure.  3. The mitral valve is normal in structure and function. No evidence of mitral valve regurgitation. No evidence of mitral stenosis.  4. The aortic valve is tricuspid. Aortic valve regurgitation is not visualized. Mild aortic valve sclerosis is present, with no evidence of aortic valve stenosis.  5. The inferior vena cava is dilated in size with >50% respiratory variability, suggesting right atrial pressure of 8 mmHg. FINDINGS  Left Ventricle: Left ventricular ejection fraction, by estimation, is 60 to 65%. The left ventricle has normal function. The left ventricle has no regional wall motion abnormalities. The left ventricular internal cavity size was normal in size. There is  no left ventricular hypertrophy. Left ventricular diastolic parameters were normal. Normal left ventricular filling pressure. Right Ventricle: The right ventricular size is normal. No increase in right ventricular wall thickness. Right ventricular systolic function is normal. Tricuspid regurgitation signal is inadequate for assessing PA pressure. Left Atrium: Left atrial size was normal in size. Right Atrium: Right atrial size was normal in size. Pericardium: There is no evidence of pericardial effusion. Mitral Valve: The mitral valve is normal in structure and function. Normal mobility of the mitral valve leaflets. No evidence of mitral valve regurgitation. No evidence of mitral valve stenosis. Tricuspid Valve: The tricuspid valve is normal in structure. Tricuspid valve regurgitation is mild . No evidence of tricuspid stenosis. Aortic Valve: The aortic valve is tricuspid. Aortic valve regurgitation is not visualized. Mild aortic valve sclerosis is present, with no evidence of aortic valve stenosis.  Pulmonic Valve: The pulmonic valve was normal in structure. Pulmonic valve regurgitation is not visualized. No evidence of pulmonic stenosis. Aorta: The aortic root is normal in size and structure. Venous: The inferior vena cava is dilated in size with greater than 50% respiratory variability, suggesting right atrial pressure of 8 mmHg. IAS/Shunts: No atrial level shunt detected by color flow Doppler.  LEFT VENTRICLE PLAX 2D LVIDd:         4.30 cm  Diastology LVIDs:         2.80 cm  LV e' lateral:   12.10 cm/s LV PW:         0.90 cm  LV E/e' lateral: 6.6 LV IVS:        0.90 cm  LV e' medial:    11.30 cm/s LVOT diam:     1.80 cm  LV E/e' medial:  7.0 LV SV:         73 LV SV Index:   43 LVOT Area:     2.54 cm  RIGHT VENTRICLE RV S prime:     15.60 cm/s TAPSE (M-mode): 2.2 cm LEFT ATRIUM             Index       RIGHT ATRIUM          Index LA diam:        2.80 cm 1.66 cm/m  RA Area:     8.61 cm LA Vol (A2C):   27.4 ml 16.20 ml/m RA Volume:   16.80 ml 9.93 ml/m LA Vol (A4C):   27.9 ml 16.49 ml/m LA Biplane Vol: 27.7 ml 16.38 ml/m  AORTIC VALVE LVOT Vmax:   144.00 cm/s LVOT Vmean:  93.300 cm/s LVOT VTI:  0.285 m  AORTA Ao Root diam: 3.20 cm MITRAL VALVE MV Area (PHT): 3.42 cm    SHUNTS MV Decel Time: 222 msec    Systemic VTI:  0.29 m MV E velocity: 79.30 cm/s  Systemic Diam: 1.80 cm MV A velocity: 89.10 cm/s MV E/A ratio:  0.89 Armanda Magic MD Electronically signed by Armanda Magic MD Signature Date/Time: 12/19/2019/10:44:12 AM    Final    VAS [place] LOWER EXTREMITY VENOUS (DVT)  Result Date: 12/20/2019  Lower Venous DVTStudy Indications: Pulmonary embolism.  Anticoagulation: Heparin. Comparison Study: No prior exam. Performing Technologist: Kennedy Bucker ARDMS, RVT  Examination Guidelines: A complete evaluation includes B-mode imaging, spectral Doppler, color Doppler, and power Doppler as needed of all accessible portions of each vessel. Bilateral testing is considered an integral part of a complete  examination. Limited examinations for reoccurring indications may be performed as noted. The reflux portion of the exam is performed with the patient in reverse Trendelenburg.  +---------+---------------+---------+-----------+----------+--------------+ RIGHT    CompressibilityPhasicitySpontaneityPropertiesThrombus Aging +---------+---------------+---------+-----------+----------+--------------+ CFV      Full           Yes      Yes                                 +---------+---------------+---------+-----------+----------+--------------+ SFJ      Full                                                        +---------+---------------+---------+-----------+----------+--------------+ FV Prox  Full                                                        +---------+---------------+---------+-----------+----------+--------------+ FV Mid   Full                                                        +---------+---------------+---------+-----------+----------+--------------+ FV DistalFull                                                        +---------+---------------+---------+-----------+----------+--------------+ PFV      Full                                                        +---------+---------------+---------+-----------+----------+--------------+ POP      Full           Yes      Yes                                 +---------+---------------+---------+-----------+----------+--------------+ PTV      Full                                                        +---------+---------------+---------+-----------+----------+--------------+  PERO     Full                                                        +---------+---------------+---------+-----------+----------+--------------+   +---------+---------------+---------+-----------+----------+--------------+ LEFT     CompressibilityPhasicitySpontaneityPropertiesThrombus Aging  +---------+---------------+---------+-----------+----------+--------------+ CFV      Full           Yes      Yes                                 +---------+---------------+---------+-----------+----------+--------------+ SFJ      Full                                                        +---------+---------------+---------+-----------+----------+--------------+ FV Prox  Full                                                        +---------+---------------+---------+-----------+----------+--------------+ FV Mid   Full                                                        +---------+---------------+---------+-----------+----------+--------------+ FV DistalFull                                                        +---------+---------------+---------+-----------+----------+--------------+ PFV      Full                                                        +---------+---------------+---------+-----------+----------+--------------+ POP      Full           Yes      Yes                                 +---------+---------------+---------+-----------+----------+--------------+ PTV      Full                                                        +---------+---------------+---------+-----------+----------+--------------+ PERO     Full                                                        +---------+---------------+---------+-----------+----------+--------------+  Summary: BILATERAL: - No evidence of deep vein thrombosis seen in the lower extremities, bilaterally.  RIGHT: - No cystic structure found in the popliteal fossa.  LEFT: - No cystic structure found in the popliteal fossa.  *See table(s) above for measurements and observations. Electronically signed by Fabienne Bruns MD on 12/20/2019 at 5:54:34 PM.    Final    CT Maxillofacial Wo Contrast  Result Date: 12/18/2019 CLINICAL DATA:  Facial trauma, syncope, somnolent EXAM: CT MAXILLOFACIAL WITHOUT  CONTRAST TECHNIQUE: Multidetector CT imaging of the maxillofacial structures was performed. Multiplanar CT image reconstructions were also generated. COMPARISON:  None. FINDINGS: Osseous: No fracture or mandibular dislocation. No destructive process. Orbits: Negative. No traumatic or inflammatory finding. Sinuses: Clear. Soft tissues: Negative. Limited intracranial: No significant or unexpected finding. IMPRESSION: 1. No acute facial bone fracture. Electronically Signed   By: Sharlet Salina M.D.   On: 12/18/2019 23:26    Microbiology: No results found for this or any previous visit (from the past 240 hour(s)).   Labs: Basic Metabolic Panel: Recent Labs  Lab 01/05/20 0302 01/06/20 0402  NA 132* 136  K 5.0 4.6  CL 99 99  CO2 21* 28  GLUCOSE 136* 117*  BUN 18 13  CREATININE 0.75 0.70  CALCIUM 9.2 8.9   Liver Function Tests: Recent Labs  Lab 01/06/20 0402  AST 21  ALT 21  ALKPHOS 108  BILITOT 0.2*  PROT 5.9*  ALBUMIN 3.0*   No results for input(s): LIPASE, AMYLASE in the last 168 hours. No results for input(s): AMMONIA in the last 168 hours. CBC: Recent Labs  Lab 01/06/20 0402 01/06/20 0402 01/07/20 0414 01/08/20 0337 01/09/20 0455 01/10/20 0230 01/11/20 0347  WBC 11.6*   < > 7.1 7.8 7.2 7.1 7.6  NEUTROABS 8.3*  --   --   --   --   --   --   HGB 9.8*   < > 9.4* 10.0* 10.4* 10.9* 11.6*  HCT 31.3*   < > 30.0* 31.4* 32.7* 34.3* 35.9*  MCV 96.3   < > 96.2 95.7 96.2 95.5 95.2  PLT 309   < > 332 369 436* 459* 471*   < > = values in this interval not displayed.   Cardiac Enzymes: No results for input(s): CKTOTAL, CKMB, CKMBINDEX, TROPONINI in the last 168 hours. BNP: BNP (last 3 results) No results for input(s): BNP in the last 8760 hours.  ProBNP (last 3 results) No results for input(s): PROBNP in the last 8760 hours.  CBG: No results for input(s): GLUCAP in the last 168 hours.  Active Problems:   Syncope   Quadriplegia (HCC)   Hypotension   Bacteremia    Poor dentition   Spinal stenosis of cervical region   Malnutrition of moderate degree   Time coordinating discharge: 38 minutes  Signed:        Viktoria Gruetzmacher, DO [facility]  01/11/2020, 2:07 PM

## 2020-01-11 NOTE — Progress Notes (Signed)
Physical Therapy Treatment Patient Details Name: Anna Campos MRN: [id number] DOB: September 27, 1966 Today's Date: 01/11/2020    History of Present Illness 54 year old woman with no significant past medical history presenting with syncope and s/p fall. Pt found to have incomplete spincal cord injury, bacteremia, and bilat PEs. Underwent C 3-7 ACDF 3/16.     PT Comments    Pt seated in recliner on arrival working on motor planning with her husband.  Pt continues to require +2 moderate assistance to rise into standing and max +1 for progression of gt training.  Pt continues to benefit from aggressive rehab at [facility] before returning home with support from her husband.      Follow Up Recommendations  CIR     Equipment Recommendations  Wheelchair (measurements PT);Wheelchair cushion (measurements PT)    Recommendations for Other Services Rehab consult     Precautions / Restrictions Precautions Precautions: Fall;Cervical Precaution Comments: central cord. Monitor BP - orthostatic hypotension Required Braces or Orthoses: Cervical Brace Cervical Brace: Soft collar Restrictions Weight Bearing Restrictions: No    Mobility  Bed Mobility               General bed mobility comments: Pt seated in recliner on arrival.  Transfers Overall transfer level: Needs assistance Equipment used: 4-wheeled walker(EVA walker) Transfers: Sit to/from Stand Sit to Stand: Mod assist;+2 physical assistance         General transfer comment: Cues for forward weight shifting and assistance to boost into standing.  Ambulation/Gait Ambulation/Gait assistance: Max assist;+2 safety/equipment Gait Distance (Feet): 12 Feet(+ 40 ft) Assistive device: 4-wheeled walker(EVA walker) Gait Pattern/deviations: Step-through pattern;Ataxic;Narrow base of support;Trunk flexed;Decreased dorsiflexion - right Gait velocity: dec   General Gait Details: Pt with R knee hyper extension in stance phase.  Required assistance  to advance EVA walker forward and cues for sequencing.  Close chair follow for safety.   Stairs             Wheelchair Mobility    Modified Rankin (Stroke Patients Only)       Balance Overall balance assessment: Needs assistance Sitting-balance support: Feet supported Sitting balance-Leahy Scale: Fair       Standing balance-Leahy Scale: Poor                              Cognition Arousal/Alertness: Awake/alert Behavior During Therapy: WFL for tasks assessed/performed Overall Cognitive Status: Within Functional Limits for tasks assessed                                 General Comments: Pt extremely motivated and willing to participate in therapy.       Exercises      General Comments        Pertinent Vitals/Pain Pain Assessment: No/denies pain Faces Pain Scale: Hurts even more Pain Location: R shoulder/arm Pain Descriptors / Indicators: Sore Pain Intervention(s): Monitored during session;Repositioned    Home Living                      Prior Function            PT Goals (current goals can now be found in the care plan section) Acute Rehab PT Goals Patient Stated Goal: to get better and be independent Potential to Achieve Goals: Good Progress towards PT goals: Progressing toward goals    Frequency    Min 5X/week  PT Plan Current plan remains appropriate    Co-evaluation              AM-PAC PT "6 Clicks" Mobility   Outcome Measure  Help needed turning from your back to your side while in a flat bed without using bedrails?: A Little Help needed moving from lying on your back to sitting on the side of a flat bed without using bedrails?: A Lot Help needed moving to and from a bed to a chair (including a wheelchair)?: A Lot Help needed standing up from a chair using your arms (e.g., wheelchair or bedside chair)?: A Lot Help needed to walk in hospital room?: Total Help needed climbing 3-5 steps  with a railing? : Total 6 Click Score: 11    End of Session Equipment Utilized During Treatment: Gait belt;Cervical collar Activity Tolerance: Patient tolerated treatment well;Patient limited by fatigue Patient left: with family/visitor present;in bed;with call bell/phone within reach;with bed alarm set Nurse Communication: Mobility status PT Visit Diagnosis: Unsteadiness on feet (R26.81);Other abnormalities of gait and mobility (R26.89);Repeated falls (R29.6);Muscle weakness (generalized) (M62.81);History of falling (Z91.81);Ataxic gait (R26.0);Difficulty in walking, not elsewhere classified (R26.2)     Time: 1150-1210 PT Time Calculation (min) (ACUTE ONLY): 20 min  Charges:  $Gait Training: 8-22 mins                     Erasmo Leventhal , PTA Acute Rehabilitation Services Pager [phone] Office [phone]     Karcyn Menn Eli Hose 01/11/2020, 2:21 PM

## 2020-01-11 NOTE — H&P (Signed)
Physical Medicine and Rehabilitation Admission H&P    : HPI: Anna Campos is a 54 year old right-handed female with unremarkable past medical history except tobacco abuse on no prescription medications.  Per chart review lives with spouse and 67 year old daughter.  Independent prior to admission.  She works in Clinical biochemist for [employer].  1 level home 3 steps to entry.  Presented 12/18/2019 with syncope, orthostasis and generalized fatigue as well as reported fall by her family and questionable loss of consciousness.  Admission chemistries and blood culture Streptococcus, alcohol negative, lactic acid 1.8.  Cranial CT scan negative.  Echocardiogram with ejection fraction of 65% without emboli.  CTA of chest abdomen pelvis negative.  CT angiogram of the chest showed small nonocclusive pulmonary emboli bilaterally involving segmental and subsegmental size vessels.  Bilateral lower extremity Dopplers negative.  Patient was placed on heparin therapy.  MRI of the brain no evidence of acute intracranial abnormality.  Infectious disease consulted for streptococcal bacteremia maintained on Rocephin x14 days initiated 12/20/2019.  TEE showed no vegetation.  CT of cervical spine showed prominent spinal cord signal abnormality at the C4-C6 levels.  Involvement of the entire cord cross-section at the lower C5 level.  Edema signal within the interspinous spaces at the C3-T2 levels.  C4-C5 posterior disc osteophyte complex contributes to severe spinal canal stenosis with spinal cord flattening.  Neurosurgery Dr. Jordan Likes consulted and plan would be for surgical intervention with decompression and stabilization after antibiotic therapy completed.  Hospital course complicated by hemoglobin 6.8 from admission hemoglobin 10.7 patient did receive 2 units packed red blood cells 12/25/2019.  Patient occult heme positive.  Gastroenterology services Dr. Matthias Hughs consulted underwent EGD 12/27/2019 showing normal-appearing esophagus.   Mild erythematous mucosa in the entire gastric cavity.  2 superficial prepyloric ulcers 1 with clean base and the other with pigmentation.  1 Endo Clip was deployed to close the mucosal defect.  Normal-appearing cardia and fundus.  H. pylori serology positive placed on triple therapy clindamycin amoxicillin PPI 12/30/2019 patient was cleared to resume intravenous heparin and PPI was added twice daily for 2 months.  Her latest hemoglobin was 9.4 on 01/07/2020.  Patient also with multiple dental caries underwent multiple extractions 12/30/2019.  Patient was later cleared for surgical intervention and underwent C3-4, 4-5, 5-6, C6-7 anterior cervical decompression and fusion utilizing interbody cages 01/04/2020 per Dr. Jordan Likes.  Cervical brace as directed.  Maintained on a regular diet.  She currently remains on intravenous heparin for pulmonary emboli and transitioned to Eliquis 01/11/2020.  Therapy evaluations completed and patient was admitted for a comprehensive rehab program.  Pt reports she walked 1/2 way down the hall today- has to wear soft collar at all times, per surgeon.   Spasms have improved/resolved since surgery.  Taking Norco for pressure in neck and back.  Is voiding "ok"- needs to have a BM.   Dr Dutch Quint is Careers adviser.  Review of Systems  Constitutional: Negative for chills and fever.  HENT: Negative for hearing loss.   Eyes: Negative for blurred vision and double vision.  Respiratory: Positive for shortness of breath.   Cardiovascular: Negative for chest pain, leg swelling and PND.  Gastrointestinal: Positive for constipation. Negative for heartburn, nausea and vomiting.  Genitourinary: Negative for dysuria, flank pain and hematuria.  Musculoskeletal: Positive for myalgias.       Patient did have a recent fall striking her head.  Skin: Negative for rash.  Neurological: Positive for dizziness.       Syncope  All other systems reviewed and are negative.  Past Medical History:  Diagnosis  Date  . PE (pulmonary thromboembolism) (HCC) 12/20/2019  . Syncope 12/20/2019   Past Surgical History:  Procedure Laterality Date  . ANTERIOR CERVICAL DECOMPRESSION/DISCECTOMY FUSION 4 LEVELS N/A 01/04/2020   Procedure: CERVICAL THREE- FOUR, CERVICAL FOUR-FIVE, CERVICAL FIVE- SIX, CERVCAL SIX- SEVEN ANTERIOR CERVICAL DECOMPRESSION/DISCECTOMY FUSION;  Surgeon: Julio Sicks, MD;  Location: [facility] OR;  Service: Neurosurgery;  Laterality: N/A;  CERVICAL THREE- FOUR, CERVICAL FOUR-FIVE, CERVICAL FIVE- SIX, CERVCAL SIX- SEVEN ANTERIOR CERVICAL DECOMPRESSION/DISCECTOMY FUSION  . CESAREAN SECTION    . ESOPHAGOGASTRODUODENOSCOPY (EGD) WITH PROPOFOL N/A 12/27/2019   Procedure: ESOPHAGOGASTRODUODENOSCOPY (EGD) WITH PROPOFOL;  Surgeon: Kerin Salen, MD;  Location: [facility] ENDOSCOPY;  Service: Gastroenterology;  Laterality: N/A;  PLEASE ALERT ANESTHESIA TO THE FACT THAT THE PATIENT HAS SUSTAINED A NECK INJURY AND HAS A QUADRIPARESIS  . HEMOSTASIS CLIP PLACEMENT  12/27/2019   Procedure: HEMOSTASIS CLIP PLACEMENT;  Surgeon: Kerin Salen, MD;  Location: [facility] ENDOSCOPY;  Service: Gastroenterology;;  . MULTIPLE EXTRACTIONS WITH ALVEOLOPLASTY N/A 12/30/2019   Procedure: Extraction of tooth #'s 2-8, 11-14, and 20-28 with alveoloplasty and bilateral mandibular lingual tori reductions.;  Surgeon: Charlynne Pander, DDS;  Location: [facility] OR;  Service: Oral Surgery;  Laterality: N/A;   Family History  Problem Relation Age of Onset  . Breast cancer Mother    Social History:  reports that she has quit smoking. She has never used smokeless tobacco. She reports current alcohol use. She reports previous drug use. Allergies:  Allergies  Allergen Reactions  . Ampicillin Diarrhea  . Chlorhexidine    Medications Prior to Admission  Medication Sig Dispense Refill  . amoxicillin (AMOXIL) 500 MG capsule Take 2 capsules (1,000 mg total) by mouth every 12 (twelve) hours for 2 days. 8 capsule 0  . apixaban (ELIQUIS) 5 MG TABS tablet Take 2  tablets (10 mg total) by mouth 2 (two) times daily for 7 days. 28 tablet 0  . [START ON 01/18/2020] apixaban (ELIQUIS) 5 MG TABS tablet Take 1 tablet (5 mg total) by mouth 2 (two) times daily. 60 tablet 0  . chlorhexidine (PERIDEX) 0.12 % solution Use as directed 15 mLs in the mouth or throat 2 (two) times daily. 120 mL 0  . cyclobenzaprine (FLEXERIL) 5 MG tablet Take 1 tablet (5 mg total) by mouth 3 (three) times daily as needed for muscle spasms. 30 tablet 0  . [START ON 01/12/2020] feeding supplement, ENSURE ENLIVE, (ENSURE ENLIVE) LIQD Take 237 mLs by mouth 2 (two) times daily between meals. 237 mL 12  . HYDROcodone-acetaminophen (NORCO) 10-325 MG tablet Take 1-2 tablets by mouth every 4 (four) hours as needed for severe pain ((score 7 to 10)). 30 tablet 0  . [START ON 01/12/2020] loratadine (CLARITIN) 10 MG tablet Take 1 tablet (10 mg total) by mouth daily. 30 tablet 0  . Multiple Vitamin (MULTIVITAMIN WITH MINERALS) TABS tablet Take 1 tablet by mouth daily.    . pantoprazole (PROTONIX) 40 MG tablet Take 1 tablet (40 mg total) by mouth 2 (two) times daily before a meal for 2 days. 4 tablet 0  . sodium chloride (OCEAN) 0.65 % SOLN nasal spray Place 1 spray into both nostrils as needed for congestion. 30 mL 0    Drug Regimen Review Drug regimen was reviewed and remains appropriate with no significant issues identified  Home:     Functional History:    Functional Status:  Mobility:  ADL:    Cognition:      Physical Exam: There were no vitals taken for this visit. Physical Exam  Nursing note and vitals reviewed. Constitutional: She is oriented to person, place, and time. She appears well-developed and well-nourished.  Younger appearing female sitting up in bedside chair, husband at side, smiling, NAD  HENT:  Head: Normocephalic and atraumatic.  Nose: Nose normal.  Mouth/Throat: Oropharynx is clear and moist.  No facial droop; tongue midline; teeth removed- no  dentures as of yet Wearing cervical collar- soft collar- anterior incision- honeycomb dressing- no drainage  Eyes: Conjunctivae are normal. Right eye exhibits no discharge. Left eye exhibits no discharge. No scleral icterus.  EOMI B/L; no nystagmus  Neck: No tracheal deviation present.  Decreased cervical ROM in all directions due to collar  Cardiovascular:  RRR  Respiratory: No stridor.  CTA B/L- no W/R/R  GI:  Soft, slightly distended, NT; (+) hypoactive BS  Musculoskeletal:     Cervical back: Neck supple.     Comments: RUE- bicep 5/5, WE 4/5, tricep 3/5, grip 2/5, finger 0/5 LUE_ bicep 5/5, WE 4/5, triceps 2/5, grip 3/5, finger 1/5 RLE- HF 2/5, KE 4/5, DF 2/5, PF 4/5, EHL 3/5 LLE- 4/5 in all muscles detailed above   Neurological: She is alert and oriented to person, place, and time. No cranial nerve deficit.  No increased tone; (+) Hoffman's in UEs No clonus in LEs Sensation intact to light touch in all 4 extremities and torso B/L    Skin:  No skin breakdown- per husband none on backside (on chair- could not assess)  Anterior neck incision detailed above L forearm IV- no infiltrate  Psychiatric: She has a normal mood and affect.  Bright affect; appropriate.     Results for orders placed or performed during the hospital encounter of 12/18/19 (from the past 48 hour(s))  Heparin level (unfractionated)     Status: None   Collection Time: 01/09/20  8:12 PM  Result Value Ref Range   Heparin Unfractionated 0.60 0.30 - 0.70 IU/mL    Comment: (NOTE) If heparin results are below expected values, and patient dosage has  been confirmed, suggest follow up testing of antithrombin III levels. Performed at [facility], [place] [address] [place], [place] [address]   CBC     Status: Abnormal   Collection Time: 01/10/20  2:30 AM  Result Value Ref Range   WBC 7.1 4.0 - 10.5 K/uL   RBC 3.59 (L) 3.87 - 5.11 MIL/uL   Hemoglobin 10.9 (L) 12.0 - 15.0 g/dL   HCT 34.3 (L) 36.0 - 46.0  %   MCV 95.5 80.0 - 100.0 fL   MCH 30.4 26.0 - 34.0 pg   MCHC 31.8 30.0 - 36.0 g/dL   RDW 13.3 11.5 - 15.5 %   Platelets 459 (H) 150 - 400 K/uL   nRBC 0.0 0.0 - 0.2 %    Comment: Performed at [facility], [place] [address], [place], [place] [address]  Heparin level (unfractionated)     Status: None   Collection Time: 01/10/20  2:30 AM  Result Value Ref Range   Heparin Unfractionated 0.38 0.30 - 0.70 IU/mL    Comment: (NOTE) If heparin results are below expected values, and patient dosage has  been confirmed, suggest follow up testing of antithrombin III levels. Performed at [facility], [place] [address], [place], [place] [address]   CBC     Status: Abnormal   Collection  Time: 01/11/20  3:47 AM  Result Value Ref Range   WBC 7.6 4.0 - 10.5 K/uL   RBC 3.77 (L) 3.87 - 5.11 MIL/uL   Hemoglobin 11.6 (L) 12.0 - 15.0 g/dL   HCT 76.1 (L) 60.7 - 37.1 %   MCV 95.2 80.0 - 100.0 fL   MCH 30.8 26.0 - 34.0 pg   MCHC 32.3 30.0 - 36.0 g/dL   RDW 06.2 69.4 - 85.4 %   Platelets 471 (H) 150 - 400 K/uL   nRBC 0.0 0.0 - 0.2 %    Comment: Performed at [facility], [address], [place], [place] [address]  Heparin level (unfractionated)     Status: None   Collection Time: 01/11/20  3:47 AM  Result Value Ref Range   Heparin Unfractionated 0.31 0.30 - 0.70 IU/mL    Comment: (NOTE) If heparin results are below expected values, and patient dosage has  been confirmed, suggest follow up testing of antithrombin III levels. Performed at [facility], [address], [place], [place] [address]    No results found.     Medical Problem List and Plan: 1.  Quadriplegia secondary to C5 incomplete ASIA C initially, now D spinal cord injury.S/P C3-4 4-55-6 and C6-7 anterior cervical decompression and fusion 01/04/2020.  Cervical brace as directed  -patient may  Shower with incision covered  -ELOS/Goals: mod I to supervision 3-4 weeks 2.  Antithrombotics/pulmonary emboli per CT  angiogram of chest 12/20/2019: Lower extremity Doppler negative.  Continue intravenous heparin awaiting plan transition to oral agent -DVT/anticoagulation: Intravenous heparin transition to Eliquis 01/11/2020  -antiplatelet therapy: N/A 3. Pain Management: Oxycodone and Robaxin as needed 4. Mood: Provide emotional support  -antipsychotic agents: N/A 5. Neuropsych: This patient is capable of making decisions on her own behalf. 6. Skin/Wound Care: Routine skin checks 7. Fluids/Electrolytes/Nutrition: Routine in and outs with follow-up chemistries 8.  Anemia.  EGD completed 12/27/2019 per GI services.  Recommend PPI x2 months.  H. pylori serology positive.  Complete course of amoxicillin and Biaxin 9.  Multiple dental caries.  Follow-up dental services after multiple extractions 12/30/2019 10.  Neurogenic bowel and bladder due to SCI.  Check PVR x3. If retaining, will start Flomax-  Adjust bowel program- hasn't had B x5 days- only gas- will order sorbitol (didn't have control last BM)- might need bowel program.  11 Streptococcal bacteremia.  Intravenous ceftriaxone completed. 12.  History of tobacco use.  Counseling 13. Spasticity- improved since surgery- for now, doesn't need spasticity agents-  14. Dispo- husband of decades to take pt home after rehab.   Harvel Ricks PA-C 01/11/20   I have personally performed a face to face diagnostic evaluation of this patient and formulated the key components of the plan.  Additionally, I have personally reviewed laboratory data, imaging studies, as well as relevant notes and concur with the physician assistant's documentation above.   The patient's status has not changed from the original H&P.  Any changes in documentation from the acute care chart have been noted above.         Genice Rouge, MD 01/11/2020

## 2020-01-11 NOTE — Progress Notes (Signed)
Pt admitted to room 4W24. Denies pain or discomfort. Oriented to room, floor, and rehab fall policy. Pt stable at this time sitting up on recliner with husband at bedside.   Marylu Lund, RN

## 2020-01-11 NOTE — Progress Notes (Signed)
Last documeneted BM was 3/19. Pt given sorbitol during day shift. Pt stated she can feel her bowels moving but unable to push out. RN broached the possibiliy of needing bowel program and/or dig stim, however pt refused for now. Pt did receive prune juice to help with bowel movement.   Pt did have BM today, type 1, large and brown. Pt stated she still felt constipated and sat on BSC with no result. She did ask for prn sorbitol.

## 2020-01-12 ENCOUNTER — Inpatient Hospital Stay (HOSPITAL_COMMUNITY): Payer: Commercial Managed Care - PPO | Admitting: Occupational Therapy

## 2020-01-12 ENCOUNTER — Inpatient Hospital Stay (HOSPITAL_COMMUNITY): Payer: Commercial Managed Care - PPO | Admitting: Physical Therapy

## 2020-01-12 ENCOUNTER — Encounter (HOSPITAL_COMMUNITY): Payer: Self-pay | Admitting: Physical Medicine and Rehabilitation

## 2020-01-12 LAB — CBC WITH DIFFERENTIAL/PLATELET
Abs Immature Granulocytes: 0.04 10*3/uL (ref 0.00–0.07)
Basophils Absolute: 0 10*3/uL (ref 0.0–0.1)
Basophils Relative: 0 %
Eosinophils Absolute: 0.2 10*3/uL (ref 0.0–0.5)
Eosinophils Relative: 2 %
HCT: 34.4 % — ABNORMAL LOW (ref 36.0–46.0)
Hemoglobin: 11 g/dL — ABNORMAL LOW (ref 12.0–15.0)
Immature Granulocytes: 0 %
Lymphocytes Relative: 20 %
Lymphs Abs: 1.8 10*3/uL (ref 0.7–4.0)
MCH: 30.6 pg (ref 26.0–34.0)
MCHC: 32 g/dL (ref 30.0–36.0)
MCV: 95.6 fL (ref 80.0–100.0)
Monocytes Absolute: 1 10*3/uL (ref 0.1–1.0)
Monocytes Relative: 11 %
Neutro Abs: 5.9 10*3/uL (ref 1.7–7.7)
Neutrophils Relative %: 67 %
Platelets: 477 10*3/uL — ABNORMAL HIGH (ref 150–400)
RBC: 3.6 MIL/uL — ABNORMAL LOW (ref 3.87–5.11)
RDW: 13.6 % (ref 11.5–15.5)
WBC: 9 10*3/uL (ref 4.0–10.5)
nRBC: 0 % (ref 0.0–0.2)

## 2020-01-12 LAB — COMPREHENSIVE METABOLIC PANEL
ALT: 86 U/L — ABNORMAL HIGH (ref 0–44)
AST: 50 U/L — ABNORMAL HIGH (ref 15–41)
Albumin: 3.5 g/dL (ref 3.5–5.0)
Alkaline Phosphatase: 223 U/L — ABNORMAL HIGH (ref 38–126)
Anion gap: 11 (ref 5–15)
BUN: 15 mg/dL (ref 6–20)
CO2: 27 mmol/L (ref 22–32)
Calcium: 10 mg/dL (ref 8.9–10.3)
Chloride: 95 mmol/L — ABNORMAL LOW (ref 98–111)
Creatinine, Ser: 0.85 mg/dL (ref 0.44–1.00)
GFR calc Af Amer: >60 mL/min (ref >60)
GFR calc non Af Amer: >60 mL/min (ref >60)
Glucose, Bld: 119 mg/dL — ABNORMAL HIGH (ref 70–99)
Potassium: 4.3 mmol/L (ref 3.5–5.1)
Sodium: 133 mmol/L — ABNORMAL LOW (ref 135–145)
Total Bilirubin: 0.5 mg/dL (ref 0.3–1.2)
Total Protein: 7.2 g/dL (ref 6.5–8.1)

## 2020-01-12 MED ORDER — TAMSULOSIN HCL 0.4 MG PO CAPS
0.4000 mg | ORAL_CAPSULE | Freq: Every day | ORAL | Status: DC
  Administered 2020-01-12 – 2020-01-17 (×6): 0.4 mg via ORAL
  Filled 2020-01-12 (×6): qty 1

## 2020-01-12 NOTE — Evaluation (Signed)
Occupational Therapy Assessment and Plan  Patient Details  Name: Anna Campos MRN: [id number] Date of Birth: 1965/12/01  OT Diagnosis: abnormal posture and quadriparesis at level C7 Rehab Potential: Rehab Potential (ACUTE ONLY): Good ELOS: 21-24 days   Today's Date: 01/12/2020 OT Individual Time: 3212-2482       Problem List:  Patient Active Problem List   Diagnosis Date Noted  . Neurogenic bowel 01/11/2020  . Neurogenic bladder 01/11/2020  . Malnutrition of moderate degree 01/01/2020  . Poor dentition   . Spinal stenosis of cervical region   . Incomplete quadriplegia at C5-6 level ([place])   . Hypotension   . Bacteremia   . Syncope 12/19/2019    Past Medical History:  Past Medical History:  Diagnosis Date  . PE (pulmonary thromboembolism) ([place]) 12/20/2019  . Syncope 12/20/2019   Past Surgical History:  Past Surgical History:  Procedure Laterality Date  . ANTERIOR CERVICAL DECOMPRESSION/DISCECTOMY FUSION 4 LEVELS N/A 01/04/2020   Procedure: CERVICAL THREE- FOUR, CERVICAL FOUR-FIVE, CERVICAL FIVE- SIX, CERVCAL SIX- SEVEN ANTERIOR CERVICAL DECOMPRESSION/DISCECTOMY FUSION;  Surgeon: Earnie Larsson, MD;  Location: [place];  Service: Neurosurgery;  Laterality: N/A;  CERVICAL THREE- FOUR, CERVICAL FOUR-FIVE, CERVICAL FIVE- SIX, CERVCAL SIX- SEVEN ANTERIOR CERVICAL DECOMPRESSION/DISCECTOMY FUSION  . CESAREAN SECTION    . ESOPHAGOGASTRODUODENOSCOPY (EGD) WITH PROPOFOL N/A 12/27/2019   Procedure: ESOPHAGOGASTRODUODENOSCOPY (EGD) WITH PROPOFOL;  Surgeon: Ronnette Juniper, MD;  Location: [place];  Service: Gastroenterology;  Laterality: N/A;  PLEASE ALERT ANESTHESIA TO THE FACT THAT THE PATIENT HAS SUSTAINED A NECK INJURY AND HAS A QUADRIPARESIS  . HEMOSTASIS CLIP PLACEMENT  12/27/2019   Procedure: HEMOSTASIS CLIP PLACEMENT;  Surgeon: Ronnette Juniper, MD;  Location: [facility] ENDOSCOPY;  Service: Gastroenterology;;  . MULTIPLE EXTRACTIONS WITH ALVEOLOPLASTY N/A 12/30/2019   Procedure: Extraction of tooth #'s  2-8, 11-14, and 20-28 with alveoloplasty and bilateral mandibular lingual tori reductions.;  Surgeon: Lenn Cal, DDS;  Location: [place];  Service: Oral Surgery;  Laterality: N/A;    Assessment & Plan Clinical Impression: Patient is a 54 y.o. year old female with recent admission to the hospital on    Patient transferred to [place] on 01/11/2020 .    Patient currently requires total with basic self-care skills secondary to muscle weakness and muscle paralysis, impaired timing and sequencing, abnormal tone, unbalanced muscle activation, ataxia and decreased coordination and decreased sitting balance, decreased standing balance and decreased balance strategies.  Prior to hospitalization, patient could complete ADLs with independent .  Patient will benefit from skilled intervention to decrease level of assist with basic self-care skills and increase independence with basic self-care skills prior to discharge home with care partner.  Anticipate patient will require minimal physical assistance and follow up home health.  OT - End of Session Activity Tolerance: Tolerates 30+ min activity with multiple rests Endurance Deficit: Yes OT Assessment Rehab Potential (ACUTE ONLY): Good OT Patient demonstrates impairments in the following area(s): Balance;Motor;Sensory OT Basic ADL's Functional Problem(s): Eating;Grooming;Bathing;Dressing;Toileting OT Advanced ADL's Functional Problem(s): Simple Meal Preparation OT Transfers Functional Problem(s): Toilet;Tub/Shower OT Additional Impairment(s): Fuctional Use of Upper Extremity OT Plan OT Intensity: Minimum of 1-2 x/day, 45 to 90 minutes OT Frequency: 5 out of 7 days OT Duration/Estimated Length of Stay: 21-24 days OT Treatment/Interventions: Balance/vestibular training;Discharge planning;Functional electrical stimulation;Pain management;Self Care/advanced ADL retraining;Therapeutic Activities;UE/LE Coordination activities;Therapeutic  Exercise;Patient/family education;Functional mobility training;Community reintegration;DME/adaptive equipment instruction;Neuromuscular re-education;Splinting/orthotics;UE/LE Strength taining/ROM;Wheelchair propulsion/positioning OT Self Feeding Anticipated Outcome(s): supervision OT Basic Self-Care Anticipated Outcome(s): min assist OT Toileting Anticipated Outcome(s): min assist OT Bathroom  Transfers Anticipated Outcome(s): min assist OT Recommendation Recommendations for Other Services: Therapeutic Recreation consult Therapeutic Recreation Interventions: Stress management Patient destination: Home Follow Up Recommendations: Home health OT;24 hour supervision/assistance Equipment Recommended: 3 in 1 bedside comode;Tub/shower bench   Skilled Therapeutic Intervention Pt sitting in the wheelchair to start session, working on self feeding with NT.  She was able to hold the spoon with built up handle in the RUE for eating her oatmeal with min assist once positioned and bring to her mouth.  She dropped the spoon 2 times out of approximately 6 bites of food.  Decreased grasp as well as decreased active wrist extension noted with task.  Had her transition to working on bathing with stand pivot transfer to a standard chair with max assist.  She was able to complete UB bathing with overall mod assist, with therapist giving facilitation to hold the washcloth at times as well as for reaching the opposite shoulder and underarm to wash thoroughly.  She was able to complete doffing her socks with total assist and needed total +2 (pt 40%) for washing her LB sit to stand as well as donning all clothing.  Increased difficulty crossing the RLE over the left knee, requiring mod facilitation.  Min assist was needed for crossing the LLE over the right knee during bathing.  She completed transfer back to the recliner with total +2 (pt 30%).  Right knee hyperextension noted with mobility as well as some slight ataxia in the  trunk and LEs.  Pt very cheerful and eager to participate in session.  She was agreeable to ELOS as well as overall min assist level goals.  Pt left with soft touch call button in lap and safety alarm pad in place.    OT Evaluation Precautions/Restrictions  Precautions Precautions: Fall;Cervical Precaution Comments: reviewed cervical precautions; orthostatic Required Braces or Orthoses: Cervical Brace Cervical Brace: Soft collar;At all times Restrictions Weight Bearing Restrictions: No Other Position/Activity Restrictions: collar at all times per chart   Vital Signs Therapy Vitals Temp: 98.4 F (36.9 C) Pulse Rate: 92 Resp: 18 BP: 107/77 Patient Position (if appropriate): Sitting Oxygen Therapy SpO2: 100 % O2 Device: Room Air Pain Pain Assessment Pain Score: 2  Home Living/Prior Functioning Home Living Available Help at Discharge: Family, Available 24 hours/day Type of Home: House Home Access: Level entry Entrance Stairs-Number of Steps: 3-4 Entrance Stairs-Rails: Right Home Layout: One level Bathroom Shower/Tub: Tub/shower unit, Industrial/product designer: Yes  Lives With: Spouse, Family IADL History Homemaking Responsibilities: Yes Meal Prep Responsibility: Primary Laundry Responsibility: Primary Cleaning Responsibility: Primary Current License: Yes Mode of Transportation: Car Occupation: Full time employment Type of Occupation: Works in Therapist, art at [employer] Prior Function Level of Independence: Independent with gait, Independent with transfers  Able to Take Stairs?: Yes Driving: Yes Vocation: Full time employment ADL ADL Equipment Provided: Feeding equipment Eating: Maximal assistance Where Assessed-Eating: Chair Grooming: Maximal assistance Where Assessed-Grooming: Wheelchair Upper Body Bathing: Maximal assistance Where Assessed-Upper Body Bathing: Wheelchair Lower Body Bathing: Dependent Where  Assessed-Lower Body Bathing: Sitting at sink, Standing at sink, Clinical biochemist Dressing: Maximal assistance Where Assessed-Upper Body Dressing: Wheelchair, Sitting at sink Lower Body Dressing: Dependent Where Assessed-Lower Body Dressing: Wheelchair Toileting: Dependent Where Assessed-Toileting: Bedside Commode Toilet Transfer: Dependent Toilet Transfer Method: Arts development officer: Bedside commode Vision Baseline Vision/History: No visual deficits;Wears glasses Wears Glasses: Reading only Patient Visual Report: No change from baseline Vision Assessment?: No apparent visual deficits Perception  Perception:  Within Functional Limits Praxis Praxis: Intact Cognition Overall Cognitive Status: Within Functional Limits for tasks assessed Arousal/Alertness: Awake/alert Orientation Level: Person;Place;Situation Person: Oriented Place: Oriented Situation: Oriented Year: 2021 Month: March Day of Week: Correct Memory: Appears intact Immediate Memory Recall: Sock;Blue;Bed Memory Recall Sock: Without Cue Memory Recall Blue: Without Cue Memory Recall Bed: Without Cue Attention: Sustained;Selective Sustained Attention: Appears intact Selective Attention: Appears intact Awareness: Appears intact Problem Solving: Appears intact Safety/Judgment: Appears intact Sensation Sensation Light Touch: Appears Intact(lt touch appears intact in BLE) Hot/Cold: Appears Intact Proprioception: Impaired Detail Proprioception Impaired Details: Impaired RLE;Impaired LLE Stereognosis: Impaired Detail Stereognosis Impaired Details: Impaired RUE;Impaired LLE Coordination Gross Motor Movements are Fluid and Coordinated: No Fine Motor Movements are Fluid and Coordinated: No Coordination and Movement Description: Pt with impaired coordination of BUE>BLE Motor  Motor Motor: Tetraplegia;Abnormal tone;Ataxia;Abnormal postural alignment and control Motor - Skilled Clinical  Observations: pt presents with incomplete quadraparesis Mobility  Bed Mobility Bed Mobility: Rolling Right;Rolling Left;Supine to Sit;Sit to Supine Rolling Right: Minimal Assistance - Patient > 75% Rolling Left: Minimal Assistance - Patient > 75% Supine to Sit: Minimal Assistance - Patient > 75% Sit to Supine: Minimal Assistance - Patient > 75% Transfers Sit to Stand: Maximal Assistance - Patient 25-49% Stand to Sit: Maximal Assistance - Patient 25-49%  Trunk/Postural Assessment  Cervical Assessment Cervical Assessment: Exceptions to WFL(cervical collar in place, slight cervical protraction) Thoracic Assessment Thoracic Assessment: Within Functional Limits Lumbar Assessment Lumbar Assessment: Within Functional Limits Postural Control Postural Control: Deficits on evaluation  Balance Balance Balance Assessed: Yes Static Sitting Balance Static Sitting - Balance Support: Feet supported Static Sitting - Level of Assistance: 5: Stand by assistance Dynamic Sitting Balance Dynamic Sitting - Balance Support: During functional activity Dynamic Sitting - Level of Assistance: 4: Min assist Static Standing Balance Static Standing - Balance Support: During functional activity Static Standing - Level of Assistance: 3: Mod assist Dynamic Standing Balance Dynamic Standing - Balance Support: During functional activity Dynamic Standing - Level of Assistance: 1: +2 Total assist Extremity/Trunk Assessment RUE Assessment RUE Assessment: Exceptions to [facility] Passive Range of Motion (PROM) Comments: WFLS for all joints Active Range of Motion (AROM) Comments: AAROM WFLS for all joints General Strength Comments: Pt with only 10% of gross digit flexion with trace extension in digits 2-5.  She did exhibt thmb flexion at 3-/5 with thumb flexion at 2+/5.  Decreased ability to use the UE for bathing tasks and self feeding.  She repeatedly dropped the spoon when eating with built up handle.  See below for more  info on shoulder, elbow, and wrist strength RUE Strength Right Shoulder Flexion: 2+/5 Right Elbow Flexion: 4-/5 Right Elbow Extension: 3/5 Right Wrist Flexion: 3/5 Right Wrist Extension: 3/5 Right Hand Gross Grasp: Impaired LUE Assessment LUE Assessment: Exceptions to [facility] Passive Range of Motion (PROM) Comments: PROM WFls for all joints Active Range of Motion (AROM) Comments: AAROM WFLS for all joints General Strength Comments: Pt with gross digit flexion 3/5 with 3-/5 for gross digit extension.  Thumb flexion at 3/5 and thumb extension at 2+/5.  She was able to hold her mug with handle for drinking, but needed assist with the RUE as well secondary to weakness.  Increased difficulty squeezing out the washcloth as well as when attempting bathing tasks.  See below for shoulder, elbow, and wrist strength ratings. LUE Strength Left Shoulder Flexion: 3-/5 Left Elbow Flexion: 3+/5 Left Elbow Extension: 3/5 Left Wrist Flexion: 3+/5 Left Wrist Extension: 3+/5 Left Hand Gross Grasp: Impaired  Refer to Care Plan for Long Term Goals  Recommendations for other services: Therapeutic Recreation  Stress management   Discharge Criteria: Patient will be discharged from OT if patient refuses treatment 3 consecutive times without medical reason, if treatment goals not met, if there is a change in medical status, if patient makes no progress towards goals or if patient is discharged from hospital.  The above assessment, treatment plan, treatment alternatives and goals were discussed and mutually agreed upon: by patient  Tryniti Laatsch OTR/L 01/12/2020, 4:51 PM

## 2020-01-12 NOTE — Progress Notes (Signed)
Physical Therapy Session Note  Patient Details  Name: Anna Campos MRN: [id number] Date of Birth: 10/27/1965  Today's Date: 01/12/2020 PT Individual Time: 1450-1545   55 min    Short Term Goals: Week 1:  PT Short Term Goal 1 (Week 1): Pt will complete least restrictive transfers with assist x 1 PT Short Term Goal 2 (Week 1): Pt will ambulate x 50 ft with LRAD PT Short Term Goal 3 (Week 1): Pt will maintain static standing balance with min A  Skilled Therapeutic Interventions/Progress Updates:   Pt received sitting in recliner and agreeable to PT. Stand pivot transfer to [facility] with mod assist and arm over PT shoulder. Pt transported to rehab gym. Sit<>stand from [facility] at EVA walker x 6 with mod assist overall and max cues for improved anterior weight shift. pregait balalance training in eva walker; lateral weight shifts R/L 2 x with min-mod assist. Reciprocal foot tap to target 3 x 5 BLE with mod assist. Noted GR on the RLE. PT obtained PFRW. Sit<>stand at PFRW x 3 with mod assist. Gait training with PFRW x 22ft with max assist for safety 2/2 proprioceptive deficits and +2 for WC follow. WC mobility x 3ft with BLE and min assist for safety; cues for improved R HS activation.       Therapy Documentation Precautions:  Precautions Precautions: Fall, Cervical Precaution Comments: reviewed cervical precautions; orthostatic Required Braces or Orthoses: Cervical Brace Cervical Brace: Soft collar, At all times Restrictions Weight Bearing Restrictions: No Other Position/Activity Restrictions: collar at all times per chart Vital Signs: Therapy Vitals Temp: 98.4 F (36.9 C) Pulse Rate: 92 Resp: 18 BP: 107/77 Patient Position (if appropriate): Sitting Oxygen Therapy SpO2: 100 % O2 Device: Room Air Pain: Pain Assessment Pain Score: 2    Therapy/Group: Individual Therapy  Golden Pop 01/12/2020, 3:52 PM

## 2020-01-12 NOTE — Evaluation (Signed)
Physical Therapy Assessment and Plan  Patient Details  Name: Anna Campos MRN: [id number] Date of Birth: 1966/03/01  PT Diagnosis: Abnormality of gait, Ataxia, Ataxic gait, Difficulty walking, Impaired sensation, Muscle weakness and Quadriplegia Rehab Potential: Good ELOS: 21-25 days   Today's Date: 01/12/2020 PT Individual Time:  1100-1200 PT Minutes: 60 min       Problem List:  Patient Active Problem List   Diagnosis Date Noted  . Neurogenic bowel 01/11/2020  . Neurogenic bladder 01/11/2020  . Malnutrition of moderate degree 01/01/2020  . Poor dentition   . Spinal stenosis of cervical region   . Incomplete quadriplegia at C5-6 level ([place])   . Hypotension   . Bacteremia   . Syncope 12/19/2019    Past Medical History:  Past Medical History:  Diagnosis Date  . PE (pulmonary thromboembolism) ([place]) 12/20/2019  . Syncope 12/20/2019   Past Surgical History:  Past Surgical History:  Procedure Laterality Date  . ANTERIOR CERVICAL DECOMPRESSION/DISCECTOMY FUSION 4 LEVELS N/A 01/04/2020   Procedure: CERVICAL THREE- FOUR, CERVICAL FOUR-FIVE, CERVICAL FIVE- SIX, CERVCAL SIX- SEVEN ANTERIOR CERVICAL DECOMPRESSION/DISCECTOMY FUSION;  Surgeon: Earnie Larsson, MD;  Location: [facility];  Service: Neurosurgery;  Laterality: N/A;  CERVICAL THREE- FOUR, CERVICAL FOUR-FIVE, CERVICAL FIVE- SIX, CERVCAL SIX- SEVEN ANTERIOR CERVICAL DECOMPRESSION/DISCECTOMY FUSION  . CESAREAN SECTION    . ESOPHAGOGASTRODUODENOSCOPY (EGD) WITH PROPOFOL N/A 12/27/2019   Procedure: ESOPHAGOGASTRODUODENOSCOPY (EGD) WITH PROPOFOL;  Surgeon: Ronnette Juniper, MD;  Location: [place];  Service: Gastroenterology;  Laterality: N/A;  PLEASE ALERT ANESTHESIA TO THE FACT THAT THE PATIENT HAS SUSTAINED A NECK INJURY AND HAS A QUADRIPARESIS  . HEMOSTASIS CLIP PLACEMENT  12/27/2019   Procedure: HEMOSTASIS CLIP PLACEMENT;  Surgeon: Ronnette Juniper, MD;  Location: [facility] ENDOSCOPY;  Service: Gastroenterology;;  . MULTIPLE EXTRACTIONS WITH  ALVEOLOPLASTY N/A 12/30/2019   Procedure: Extraction of tooth #'s 2-8, 11-14, and 20-28 with alveoloplasty and bilateral mandibular lingual tori reductions.;  Surgeon: Lenn Cal, DDS;  Location: [place];  Service: Oral Surgery;  Laterality: N/A;    Assessment & Plan Clinical Impression:  Anna Campos is a 54 year old right-handed female with unremarkable past medical history except tobacco abuse on no prescription medications.  Per chart review lives with spouse and 33 year old daughter.  Independent prior to admission.  She works in Therapist, art for [employer].  1 level home 3 steps to entry.  Presented 12/18/2019 with syncope, orthostasis and generalized fatigue as well as reported fall by her family and questionable loss of consciousness.  Admission chemistries and blood culture Streptococcus, alcohol negative, lactic acid 1.8.  Cranial CT scan negative.  Echocardiogram with ejection fraction of 65% without emboli.  CTA of chest abdomen pelvis negative.  CT angiogram of the chest showed small nonocclusive pulmonary emboli bilaterally involving segmental and subsegmental size vessels.  Bilateral lower extremity Dopplers negative.  Patient was placed on heparin therapy.  MRI of the brain no evidence of acute intracranial abnormality.  Infectious disease consulted for streptococcal bacteremia maintained on Rocephin x14 days initiated 12/20/2019.  TEE showed no vegetation.  CT of cervical spine showed prominent spinal cord signal abnormality at the C4-C6 levels.  Involvement of the entire cord cross-section at the lower C5 level.  Edema signal within the interspinous spaces at the C3-T2 levels.  C4-C5 posterior disc osteophyte complex contributes to severe spinal canal stenosis with spinal cord flattening.  Neurosurgery Dr. Annette Stable consulted and plan would be for surgical intervention with decompression and stabilization after antibiotic therapy completed.  Hospital course complicated by  hemoglobin 6.8  from admission hemoglobin 10.7 patient did receive 2 units packed red blood cells 12/25/2019.  Patient occult heme positive.  Gastroenterology services Dr. Cristina Gong consulted underwent EGD 12/27/2019 showing normal-appearing esophagus.  Mild erythematous mucosa in the entire gastric cavity.  2 superficial prepyloric ulcers 1 with clean base and the other with pigmentation.  1 Endo Clip was deployed to close the mucosal defect.  Normal-appearing cardia and fundus.  H. pylori serology positive placed on triple therapy clindamycin amoxicillin PPI 12/30/2019 patient was cleared to resume intravenous heparin and PPI was added twice daily for 2 months.  Her latest hemoglobin was 9.4 on 01/07/2020.  Patient also with multiple dental caries underwent multiple extractions 12/30/2019.  Patient was later cleared for surgical intervention and underwent C3-4, 4-5, 5-6, C6-7 anterior cervical decompression and fusion utilizing interbody cages 01/04/2020 per Dr. Annette Stable.  Cervical brace as directed.  Maintained on a regular diet.  She currently remains on intravenous heparin for pulmonary emboli and transitioned to Eliquis 01/11/2020.  Therapy evaluations completed and patient was admitted for a comprehensive rehab program. Patient transferred to CIR on 01/11/2020 .   Patient currently requires total with mobility secondary to muscle weakness, decreased cardiorespiratoy endurance, abnormal tone, unbalanced muscle activation, ataxia and decreased coordination and decreased sitting balance, decreased standing balance, decreased postural control and decreased balance strategies.  Prior to hospitalization, patient was independent  with mobility and lived with Spouse, Family in a House home.  Home access is 3-4Level entry.  Patient will benefit from skilled PT intervention to maximize safe functional mobility, minimize fall risk and decrease caregiver burden for planned discharge home with 24 hour assist.  Anticipate patient will benefit from  follow up [facility] at discharge.  PT - End of Session Activity Tolerance: Tolerates 30+ min activity with multiple rests Endurance Deficit: Yes PT Assessment Rehab Potential (ACUTE/IP ONLY): Good PT Patient demonstrates impairments in the following area(s): Balance;Endurance;Motor;Pain;Safety;Sensory PT Transfers Functional Problem(s): Bed Mobility;Bed to Chair;Car;Furniture;Floor PT Locomotion Functional Problem(s): Ambulation;Wheelchair Mobility;Stairs PT Plan PT Intensity: Minimum of 1-2 x/day ,45 to 90 minutes PT Frequency: 5 out of 7 days PT Duration Estimated Length of Stay: 21-25 days PT Treatment/Interventions: Ambulation/gait training;Balance/vestibular training;Community reintegration;Discharge planning;DME/adaptive equipment instruction;Functional electrical stimulation;Functional mobility training;Neuromuscular re-education;Pain management;Patient/family education;Psychosocial support;Splinting/orthotics;Stair training;Therapeutic Activities;Therapeutic Exercise;UE/LE Strength taining/ROM;UE/LE Coordination activities;Wheelchair propulsion/positioning PT Transfers Anticipated Outcome(s): min A PT Locomotion Anticipated Outcome(s): min A with LRAD PT Recommendation Recommendations for Other Services: Neuropsych consult;Therapeutic Recreation consult Therapeutic Recreation Interventions: Stress management;Outing/community reintergration Follow Up Recommendations: Home health PT;24 hour supervision/assistance Patient destination: Home Equipment Recommended: To be determined Equipment Details: TBD pending progress  Skilled Therapeutic Intervention Evaluation completed (see details above and below) with education on PT POC and goals and individual treatment initiated with focus on functional transfer and gait assessment. Pt received seated in recliner in room, agreeable to PT eval. Pt also requesting to use the bathroom. Stand pivot transfer recliner to [facility] with assist x 2 via 3  muskateers. Pt is dependent for clothing management and requires assist x 2 for standing balance and assist from a third person for dependent pericare and clothing management. Ambulation x 25 ft via 3 muskateers, pt exhibits ataxic gait with narrow BOS and R knee hyperextension. Pt left seated in recliner in room with needs in reach, chair alarm in place at end of session.  PT Evaluation Precautions/Restrictions Precautions Precautions: Fall;Cervical Precaution Comments: reviewed cervical precautions; orthostatic Required Braces or Orthoses: Cervical Brace Cervical Brace: Soft collar;At all times Restrictions Weight Bearing  Restrictions: No Other Position/Activity Restrictions: collar at all times per chart Home Living/Prior Functioning Home Living Available Help at Discharge: Family;Available 24 hours/day Type of Home: House Home Access: Level entry Home Layout: One level  Lives With: Spouse;Family Prior Function Level of Independence: Independent with gait;Independent with transfers  Able to Take Stairs?: Yes Driving: Yes Vocation: Full time employment Vision/Perception  Perception Perception: Within Functional Limits Praxis Praxis: Intact  Cognition Overall Cognitive Status: Within Functional Limits for tasks assessed Arousal/Alertness: Awake/alert Orientation Level: Oriented X4 Attention: Sustained;Selective Sustained Attention: Appears intact Selective Attention: Appears intact Memory: Appears intact Awareness: Appears intact Problem Solving: Appears intact Safety/Judgment: Appears intact Sensation Sensation Light Touch: Appears Intact(lt touch appears intact in BLE) Proprioception: Impaired Detail Proprioception Impaired Details: Impaired RLE;Impaired LLE Coordination Gross Motor Movements are Fluid and Coordinated: No Fine Motor Movements are Fluid and Coordinated: No Coordination and Movement Description: Pt with impaired coordination of BUE>BLE Motor   Motor Motor: Tetraplegia;Abnormal tone;Ataxia;Abnormal postural alignment and control Motor - Skilled Clinical Observations: pt presents with incomplete quadraparesis  Mobility Bed Mobility Bed Mobility: Rolling Right;Rolling Left;Supine to Sit;Sit to Supine Rolling Right: Minimal Assistance - Patient > 75% Rolling Left: Minimal Assistance - Patient > 75% Supine to Sit: Minimal Assistance - Patient > 75% Sit to Supine: Minimal Assistance - Patient > 75% Transfers Transfers: Sit to [employer] Transfers Sit to Stand: 2 Helpers Stand Pivot Transfers: 2 Press photographer (Assistive device): 2 person hand held Designer, industrial/product device: 2 person hand held assist(3 muskateers) Gait Gait Pattern: Impaired(ataxic, narrow BOS, R knee hyperextension) Gait velocity: decreased Stairs / Additional Locomotion Stairs: No Wheelchair Mobility Wheelchair Mobility: Yes Wheelchair Assistance: Dependent - Patient 0% Wheelchair Parts Management: Needs assistance  Trunk/Postural Assessment  Cervical Assessment Cervical Assessment: Exceptions to WFL(soft collar at all times; spinal precautions) Thoracic Assessment Thoracic Assessment: Within Functional Limits Lumbar Assessment Lumbar Assessment: Within Functional Limits Postural Control Postural Control: Deficits on evaluation  Balance Balance Balance Assessed: Yes Static Sitting Balance Static Sitting - Balance Support: No upper extremity supported;Feet supported Static Sitting - Level of Assistance: 5: Stand by assistance Dynamic Sitting Balance Dynamic Sitting - Balance Support: No upper extremity supported;Feet supported;During functional activity Dynamic Sitting - Level of Assistance: 4: Min Insurance risk surveyor Standing - Balance Support: Bilateral upper extremity supported;During functional activity Static Standing - Level of Assistance: 1: +2 Total assist Dynamic Standing Balance Dynamic  Standing - Balance Support: Bilateral upper extremity supported;During functional activity Dynamic Standing - Level of Assistance: 1: +2 Total assist Extremity Assessment  RUE Assessment RUE Assessment: Exceptions to [facility] Passive Range of Motion (PROM) Comments: WFLS for all joints Active Range of Motion (AROM) Comments: AAROM WFLS for all joints General Strength Comments: Pt with only 10% of gross digit flexion with trace extension in digits 2-5.  She did exhibt thmb flexion at 3-/5 with thumb flexion at 2+/5.  Decreased ability to use the UE for bathing tasks and self feeding.  She repeatedly dropped the spoon when eating with built up handle.  See below for more info on shoulder, elbow, and wrist strength RUE Strength Right Shoulder Flexion: 2+/5 Right Elbow Flexion: 4-/5 Right Elbow Extension: 3/5 Right Wrist Flexion: 3/5 Right Wrist Extension: 3/5 Right Hand Gross Grasp: Impaired LUE Assessment LUE Assessment: Exceptions to [facility] Passive Range of Motion (PROM) Comments: PROM WFls for all joints Active Range of Motion (AROM) Comments: AAROM WFLS for all joints General Strength Comments: Pt with gross digit flexion 3/5 with  3-/5 for gross digit extension.  Thumb flexion at 3/5 and thumb extension at 2+/5.  She was able to hold her mug with handle for drinking, but needed assist with the RUE as well secondary to weakness.  Increased difficulty squeezing out the washcloth as well as when attempting bathing tasks.  See below for shoulder, elbow, and wrist strength ratings. LUE Strength Left Shoulder Flexion: 3-/5 Left Elbow Flexion: 3+/5 Left Elbow Extension: 3/5 Left Wrist Flexion: 3+/5 Left Wrist Extension: 3+/5 Left Hand Gross Grasp: Impaired RLE Assessment RLE Assessment: Exceptions to [facility] General Strength Comments: impaired, see below RLE Strength Right Hip Flexion: 3-/5 Right Knee Flexion: 4/5 Right Knee Extension: 4/5 Right Ankle Dorsiflexion: 2-/5 LLE Assessment LLE  Assessment: Exceptions to [facility] General Strength Comments: impaired, see below LLE Strength Left Hip Flexion: 3/5 Left Knee Flexion: 4/5 Left Knee Extension: 4/5 Left Ankle Dorsiflexion: 4/5    Refer to Care Plan for Long Term Goals  Recommendations for other services: Neuropsych and Therapeutic Recreation  Stress management and Outing/community reintegration  Discharge Criteria: Patient will be discharged from PT if patient refuses treatment 3 consecutive times without medical reason, if treatment goals not met, if there is a change in medical status, if patient makes no progress towards goals or if patient is discharged from hospital.  The above assessment, treatment plan, treatment alternatives and goals were discussed and mutually agreed upon: by patient and by family   Excell Seltzer, PT, DPT 01/12/2020, 8:02 PM

## 2020-01-12 NOTE — Progress Notes (Signed)
Order for PVRs q8, and I/O cath if no void or scan for >250.  @2220  Pt's PVR scan for >400. RN explained to pt the reason for I/O caths and the possibility that repeat caths will train bladder to empty properly. However, pt wanted to wait and try to void later in the night.   @0130  Pt voided and scanned for 387. Pt still refused cath. RN explained to pt that she'll have to be cathed before shift ends, which pt agreed to.   @0320  Pt was cathed for 600. It was explained to pt the potential complications of retaining fluid and not emptying properly. Pt seems to understand and is more reluctant to be cathed now. Still need to reinforce the need for proper bladder emptying. Will continue to monitor.

## 2020-01-12 NOTE — Progress Notes (Signed)
Initial Nutrition Assessment  DOCUMENTATION CODES:   Non-severe (moderate) malnutrition in context of social or environmental circumstances, Underweight  INTERVENTION:   -Continue Ensure Enlive po BID, each supplement provides 350 kcal and 20 grams of protein -Continue MVI with minerals daily  NUTRITION DIAGNOSIS:   Moderate Malnutrition related to social / environmental circumstances as evidenced by mild fat depletion, moderate fat depletion, mild muscle depletion, moderate muscle depletion.  GOAL:   Patient will meet greater than or equal to 90% of their needs  MONITOR:   PO intake, Supplement acceptance, Labs, Weight trends, Skin, I & O's  REASON FOR ASSESSMENT:   Malnutrition Screening Tool    ASSESSMENT:   Anna Campos is a 54 year old right-handed female with unremarkable past medical history except tobacco abuse on no prescription medications. 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  Neurosurgery Dr. Annette Stable consulted and plan would be for surgical intervention with decompression and stabilization after antibiotic therapy completed.  Hospital course complicated by hemoglobin 6.8 from admission hemoglobin 10.7 patient did receive 2 units packed red blood cells 12/25/2019.  Patient occult heme positive.  Gastroenterology services Dr. Cristina Gong consulted underwent EGD 12/27/2019 showing normal-appearing esophagus.  Mild erythematous mucosa in the entire gastric cavity.  2 superficial prepyloric ulcers 1 with clean base and the other with pigmentation.  1 Endo Clip was deployed to close the mucosal defect.  Normal-appearing cardia and fundus.  H. pylori serology positive placed on triple therapy clindamycin amoxicillin PPI 12/30/2019 patient was cleared to resume intravenous heparin and PPI was added twice daily for 2 months.  Her latest  hemoglobin was 9.4 on 01/07/2020.  Patient also with multiple dental caries underwent multiple extractions 12/30/2019.  Patient was later cleared for surgical intervention and underwent C3-4, 4-5, 5-6, C6-7 anterior cervical decompression and fusion utilizing interbody cages 01/04/2020 per Dr. Annette Stable.  Cervical brace as directed.  Maintained on a regular diet.  She currently remains on intravenous heparin for pulmonary emboli and transitioned to Eliquis 01/11/2020.  Therapy evaluations completed and patient was admitted for a comprehensive rehab program.  Pt admitted with quadriplegia secondary to C5 incomplete ASIA C initially, now D spinal cord injury.S/P C3-4 4-55-6 and C6-7 anterior cervical decompression and fusion 01/04/2020.    Reviewed I/O's: -480 ml x 24 hours  UOP: 600 ml x 24 hours  Pt sitting in recliner chair at time of visit, pleasant and in good spirits, with husband at bedside. Pt shares that she is very encouraged at the progress that she is making; she shares with this RD a video of her walking with a walker while on the acute care side of the hospital.   Pt reports her appetite is improving, however, decreased appetite due to pain today. Meal completion 25-100%. Pt reports her husband has been bringing her in snacks such as applesauce and jello. She also is enjoying her Ensure supplements.   Pt very excited about her ability to feed herself with adaptive utensils and was proud to have RD observe her eat her lunch. Pt consumed chicken noodle soup and grilled cheese sandwich without difficulty. Pt is on a regular diet, with no teeth secondary to dental extractions. Offered to downgrade diet to a softer consistency, however, pt politely declined, stating she is able to find foods on the menu that she can tolerate well, such as meatloaf and mashed potatoes.   Discussed importance  of good meal and supplement intake to promote healing. Pt would like to continue with Ensure supplements.   Labs  reviewed: Na: 133.   NUTRITION - FOCUSED PHYSICAL EXAM:    Most Recent Value  Orbital Region  Mild depletion  Upper Arm Region  Mild depletion  Thoracic and Lumbar Region  No depletion  Buccal Region  No depletion  Temple Region  Mild depletion  Clavicle Bone Region  Mild depletion  Clavicle and Acromion Bone Region  Mild depletion  Scapular Bone Region  Mild depletion  Dorsal Hand  Moderate depletion  Patellar Region  Mild depletion  Anterior Thigh Region  Mild depletion  Posterior Calf Region  Mild depletion  Edema (RD Assessment)  None  Hair  Reviewed  Eyes  Reviewed  Mouth  Reviewed  Skin  Reviewed  Nails  Reviewed       Diet Order:   Diet Order            Diet regular Room service appropriate? Yes; Fluid consistency: Thin  Diet effective now              EDUCATION NEEDS:   Education needs have been addressed  Skin:  Skin Assessment: Skin Integrity Issues: Skin Integrity Issues:: Stage II, Incisions Stage II: sacrum, buttocks Incisions: neck  Last BM:  01/11/20  Height:   Ht Readings from Last 1 Encounters:  01/11/20 5\' 9"  (1.753 m)    Weight:   Wt Readings from Last 1 Encounters:  01/11/20 53.8 kg    Ideal Body Weight:  65.9 kg  BMI:  Body mass index is 17.52 kg/m.  Estimated Nutritional Needs:   Kcal:  1700-1900  Protein:  90-105 grams  Fluid:  > 1.7 L    01/13/20, RD, LDN, CDCES Registered Dietitian II Certified Diabetes Care and Education Specialist Please refer to [facility] for RD and/or RD on-call/weekend/after hours pager

## 2020-01-12 NOTE — Progress Notes (Signed)
Inpatient Rehabilitation  Patient information reviewed and entered into eRehab system by Jakarius Flamenco M. Solangel Mcmanaway, M.A., CCC/SLP, PPS Coordinator.  Information including medical coding, functional ability and quality indicators will be reviewed and updated through discharge.    

## 2020-01-13 ENCOUNTER — Inpatient Hospital Stay (HOSPITAL_COMMUNITY): Payer: Commercial Managed Care - PPO

## 2020-01-13 ENCOUNTER — Inpatient Hospital Stay (HOSPITAL_COMMUNITY): Payer: Commercial Managed Care - PPO | Admitting: Occupational Therapy

## 2020-01-13 DIAGNOSIS — G8254 Quadriplegia, C5-C7 incomplete: Secondary | ICD-10-CM

## 2020-01-13 MED ORDER — BISACODYL 10 MG RE SUPP
10.0000 mg | Freq: Every day | RECTAL | Status: DC
  Administered 2020-01-13 – 2020-01-20 (×5): 10 mg via RECTAL
  Filled 2020-01-13 (×4): qty 1

## 2020-01-13 MED ORDER — SENNOSIDES-DOCUSATE SODIUM 8.6-50 MG PO TABS
2.0000 | ORAL_TABLET | Freq: Every day | ORAL | Status: DC
  Administered 2020-01-14 – 2020-01-27 (×14): 2 via ORAL
  Filled 2020-01-13 (×14): qty 2

## 2020-01-13 NOTE — Plan of Care (Signed)
  Problem: Consults Goal: RH SPINAL CORD INJURY PATIENT EDUCATION Description:  See Patient Education module for education specifics.  Outcome: Progressing   Problem: SCI BOWEL ELIMINATION Goal: RH STG MANAGE BOWEL WITH ASSISTANCE Description: STG Manage Bowel with mod I Assistance. Outcome: Progressing   Problem: SCI BLADDER ELIMINATION Goal: RH STG MANAGE BLADDER WITH ASSISTANCE Description: STG Manage Bladder With Mod I Assistance Outcome: Progressing   Problem: RH SKIN INTEGRITY Goal: RH STG MAINTAIN SKIN INTEGRITY WITH ASSISTANCE Description: STG Maintain Skin Integrity With mod I Assistance. Outcome: Progressing   Problem: RH SAFETY Goal: RH STG ADHERE TO SAFETY PRECAUTIONS W/ASSISTANCE/DEVICE Description: STG Adhere to Safety Precautions With cues and reminders  Outcome: Progressing Goal: RH STG DECREASED RISK OF FALL WITH ASSISTANCE Description: STG Decreased Risk of Fall With mod I Assistance. Outcome: Progressing   Problem: RH PAIN MANAGEMENT Goal: RH STG PAIN MANAGED AT OR BELOW PT'S PAIN GOAL Description: Pain level less than 4 on scale of 0-10 Outcome: Progressing   Problem: RH KNOWLEDGE DEFICIT SCI Goal: RH STG INCREASE KNOWLEDGE OF SELF CARE AFTER SCI Description: Pt will be able to manage bowel program with min assist from family. Pt will be able to adhere to medication regimen with mod I assist from family.  Outcome: Progressing

## 2020-01-13 NOTE — Progress Notes (Signed)
Occupational Therapy Session Note  Patient Details  Name: Anna Campos MRN: [id number] Date of Birth: Jun 11, 1966  Today's Date: 01/13/2020 OT Individual Time: 7353-2992 OT Individual Time Calculation (min): 70 min    Short Term Goals: Week 1:  OT Short Term Goal 1 (Week 1): Pt will self feed using universal cuff or foam grips and the RUE for 75% of meal with no more than min assist. OT Short Term Goal 2 (Week 1): Pt will complete UB bathing with min assist for 2 consecutive sessions with use of a washmit PRN. OT Short Term Goal 3 (Week 1): Pt will perform toilet transfer stand pivot with mod assist to the 3:1. OT Short Term Goal 4 (Week 1): Pt will complete UB dressing with min assist for pullover shirt only. OT Short Term Goal 5 (Week 1): Pt/family will return demonstrate safe completion of AAROM exercises for BUE following handout.  Skilled Therapeutic Interventions/Progress Updates:    Pt sitting in chair with spouse present to start the session.  Provided wash mit and education on use when she takes a shower later today with therapy.  She was able to work on donning and doffing the mit with each UE and increased time.  She then worked on doffing her gripper socks and donning regular socks with her shoes.  She needed total assist for donning socks and shoes.  She next completed stand pivot transfer to the wheelchair from the bedside recliner with max assist.  Therapist took her down to the therapy gym via wheelchair with stand pivot transfer to the therapy mat.  Had pt transition to supine on the mat with min assist to work on BUE strengthening.  She was able to complete 2 sets of 10 reps for shoulder flexion AAROM as well as triceps extension in supine.  She needed mod assist to complete overall.  Had her transition to sitting with max assist to the right side.  Worked on donning right universal cuff with wrist support from the EOM.  She needed total assist to donn, but like the support it gave  her with simulated self feeding.  Will have her try with feeding at lunch to see how she does.  Finished session with transfer to the wheelchair stand pivot with max assist and then to the recliner back in the room.  Pt left with soft touch call button in reach and safety alarm pad in place.      Therapy Documentation Precautions:  Precautions Precautions: Fall, Cervical Precaution Comments: reviewed cervical precautions; orthostatic Required Braces or Orthoses: Cervical Brace Cervical Brace: Soft collar(collar can be removed for showering per Dr. Berline Chough) Restrictions Weight Bearing Restrictions: No Other Position/Activity Restrictions: collar at all times per chart   Pain: Pain Assessment Pain Scale: 0-10 Pain Score: 9  Pain Type: Acute pain Pain Location: Neck Pain Orientation: Posterior Pain Radiating Towards: Shoulders Pain Descriptors / Indicators: Aching;Shooting Pain Frequency: Intermittent Pain Onset: On-going Patients Stated Pain Goal: 4 Pain Intervention(s): Medication (See eMAR)(oxy) ADL: See Care Tool Section for some details of mobility and selfcare  Therapy/Group: Individual Therapy  Sahith Nurse OTR/L 01/13/2020, 12:46 PM

## 2020-01-13 NOTE — Progress Notes (Signed)
Occupational Therapy Session Note  Patient Details  Name: Anna Campos MRN: [id number] Date of Birth: [birth year]  Today's Date: 01/13/2020 OT Individual Time: 1400-1515 OT Individual Time Calculation (min): 75 min    Short Term Goals: Week 1:  OT Short Term Goal 1 (Week 1): Pt will self feed using universal cuff or foam grips and the RUE for 75% of meal with no more than min assist. OT Short Term Goal 2 (Week 1): Pt will complete UB bathing with min assist for 2 consecutive sessions with use of a washmit PRN. OT Short Term Goal 3 (Week 1): Pt will perform toilet transfer stand pivot with mod assist to the 3:1. OT Short Term Goal 4 (Week 1): Pt will complete UB dressing with min assist for pullover shirt only. OT Short Term Goal 5 (Week 1): Pt/family will return demonstrate safe completion of AAROM exercises for BUE following handout.  Skilled Therapeutic Interventions/Progress Updates:    1;1. Pt received in bed agreeable to shower. Pt uses stedy to transfer into shower with MOD A to sit to stand. Pt completes bathing with MIN HOH A get top of shoulders. Pt able to wash feet with A to hold LE into figure 4 with wash mit on BUE. Pt completes dressing with MAX A for UB dressing and total A for LB dressing at sit to stand level with MOD A for standing balance while OT advances pants past hips. Pt completes stand pivot transfer with MAX A to get into recliner. Pt given modular hose phone holder and set up stylus in U cuff and pt able to tap phone. Likely will need to modify accessibility features of phone to open phone without needing to press side button. Exited session with pt seated in bed, exit alarm on and call light in reach   Therapy Documentation Precautions:  Precautions Precautions: Fall, Cervical Precaution Comments: reviewed cervical precautions; orthostatic Required Braces or Orthoses: Cervical Brace Cervical Brace: Soft collar(collar can be removed for showering per Dr.  Berline Chough) Restrictions Weight Bearing Restrictions: No Other Position/Activity Restrictions: collar at all times per chart General:   Vital Signs:   Pain: Pain Assessment Pain Scale: 0-10 Pain Score: 10-Worst pain ever Pain Type: Acute pain Pain Location: Neck Pain Orientation: Lower Pain Radiating Towards: shoulders Pain Descriptors / Indicators: Shooting Pain Frequency: Constant Pain Onset: On-going ADL: ADL Equipment Provided: Feeding equipment Eating: Maximal assistance Where Assessed-Eating: Chair Grooming: Maximal assistance Where Assessed-Grooming: Wheelchair Upper Body Bathing: Maximal assistance Where Assessed-Upper Body Bathing: Wheelchair Lower Body Bathing: Dependent Where Assessed-Lower Body Bathing: Sitting at sink, Standing at sink, Wheelchair Upper Body Dressing: Maximal assistance Where Assessed-Upper Body Dressing: Wheelchair, Sitting at sink Lower Body Dressing: Dependent Where Assessed-Lower Body Dressing: Wheelchair Toileting: Dependent Where Assessed-Toileting: Bedside Commode Toilet Transfer: Dependent Statistician Method: Surveyor, minerals: Tourist information centre manager    Praxis   Exercises:   Other Treatments:     Therapy/Group: Individual Therapy  Shon Hale 01/13/2020, 3:16 PM

## 2020-01-13 NOTE — Progress Notes (Addendum)
During bedside shift report, patient was noted on the bed side commode. Her family member had put her on. Family was educated about the importance of him being checked off by therapy for transfers. He verbalized understanding. Bowel program was successful. She had a large bowel movement but her stomach was still "gurgling", so requesed a little more time.Will continue to monitor

## 2020-01-13 NOTE — Progress Notes (Signed)
Physical Therapy Session Note  Patient Details  Name: Anna Campos MRN: [id number] Date of Birth: 1966/07/16  Today's Date: 01/13/2020 PT Individual Time: 1130-1215 PT Individual Time Calculation (min): 45 min   Short Term Goals: Week 1:  PT Short Term Goal 1 (Week 1): Pt will complete least restrictive transfers with assist x 1 PT Short Term Goal 2 (Week 1): Pt will ambulate x 50 ft with LRAD PT Short Term Goal 3 (Week 1): Pt will maintain static standing balance with min A Week 2:    Week 3:     Skilled Therapeutic Interventions/Progress Updates:    PAIN c/o mild "tightness" post cervical spine, treatment to tolerance  Pt initially OOB in recliner and agreeable to treatment session with focus on core strengthening.  SPT recliner to wc w/mod to max assist, poor ant wt shift w/STS and Stand to sit.  Pt transported to gym for continued session.     STS w/mod to max  assist, poor ant wt shift.  Gait 65ft w/bilat platform walker w/max assist of 1 and second person to stabilize walker, severe hip and abdominal instability, max cueing at hips for wtshift and to stabilize due to bilat trendelenberg and poor activation of glutes w/excessive retraction and flexion of R>L.  Ataxic advancement bilat, scissoring,   Pt STS from wc w/mod to max assist, poor ant wt shift.  Stand to tall kneeling on mat w/max assist of 2.  In tall kneeling pt demonstrates excessive lordoses/protrusion of abdomen due to weakness.  Worked on neutral spine w/visual and tactile cues, very difficult for pt to control abs. Tall kneeling to sitting on haunches w/progressively increased flexion achieved and return to upright w/cues not to overshoot into abd hyperextension.  Repeates 12-15 reps slowly w/emphasis on postural control.  Tall kneeling to sitting on edge of mat total assist.    Worked on mechanics w/STS and repeated STS x 8-10x w/visual and heavy tactile cueing to promote increased trunk flexion and timing of extension  w/transition.  Pt w/significantly improved mechanics and performs STS w/mas, stand to sit w/mod to min following training.  Will need to continue to work on Geologist, engineering.  Pt transported to room at end of session.  wc to recliner w/mod assist of 1SPT.  Family in room w/pt. Pt left oob in recliner w/chair alarm set and needs in reach.   Therapy Documentation Precautions:  Precautions Precautions: Fall, Cervical Precaution Comments: reviewed cervical precautions; orthostatic Required Braces or Orthoses: Cervical Brace Cervical Brace: Soft collar(collar can be removed for showering per Dr. Berline Chough) Restrictions Weight Bearing Restrictions: No Other Position/Activity Restrictions: collar at all times per chart    Therapy/Group: Individual Therapy  Rada Hay, PT   Shearon Balo 01/13/2020, 12:47 PM

## 2020-01-13 NOTE — Progress Notes (Signed)
Bowel program started at 1850, with dig stim followed by dulcolax suppository. Pt resistant at first, educated on regular bowel pattern and pt agreable to program. Pt tolerated first dig stim well. Will report off to oncoming staff. Continue plan of care.   Marylu Lund, RN

## 2020-01-13 NOTE — Progress Notes (Signed)
[place] PHYSICAL MEDICINE & REHABILITATION PROGRESS NOTE   Subjective/Complaints:   Pt reports was cathed yesterday AM, but hasn't needed cathing since started on Flomax las tnight- BM was 2 days ago- doesn't really feel like could go and BM prior to 2 days ago was 1 week prior.   Got pain meds at 5am; good now.   Walked with platform walker some- plans on bathing and getting changed with OT this AM.   OT asking if can take collar off for shower.    ROS:  Pt denies SOB, abd pain, CP, N/V/C/D, and vision changes   Objective:   No results found. Recent Labs    01/11/20 0347 01/12/20 0525  WBC 7.6 9.0  HGB 11.6* 11.0*  HCT 35.9* 34.4*  PLT 471* 477*   Recent Labs    01/12/20 0525  NA 133*  K 4.3  CL 95*  CO2 27  GLUCOSE 119*  BUN 15  CREATININE 0.85  CALCIUM 10.0    Intake/Output Summary (Last 24 hours) at 01/13/2020 0959 Last data filed at 01/13/2020 0930 Gross per 24 hour  Intake 360 ml  Output 500 ml  Net -140 ml     Physical Exam: Vital Signs Blood pressure 99/75, pulse 86, temperature 97.8 F (36.6 C), temperature source Oral, resp. rate 18, height 5\' 9"  (1.753 m), weight 52.6 kg, SpO2 100 %.  Nursing note and vitals reviewed. Constitutional: Spt awake, alert, appropriate, sitting up in bedside chair, NAD HENT:  Wearing soft collar- has honeycomb dressing on anterior cervical incision  Neck: No tracheal deviation present.  Decreased cervical ROM due to surgery/incision Cardiovascular: RRR Respiratory: CTA B/L- good air movement GI: soft, but slightly distended; NT; hypoactive BS Musculoskeletal:     Cervical back: Neck supple.     Comments: RUE- bicep 5/5, WE 4/5, tricep 3/5, grip 2/5, finger 0/5 LUE_ bicep 5/5, WE 4/5, triceps 2/5, grip 3/5, finger 1/5 RLE- HF 2/5, KE 4/5, DF 2/5, PF 4/5, EHL 3/5 LLE- 4/5 in all muscles detailed above Neurological: Ox3; no hoffman's seen today B/L No clonus or increased tone IN UEs/LEs Sensation intact to  light touch in all 4 extremities and torso B/L IV out Psychiatric: bright, bubbly  Assessment/Plan: 1. Functional deficits secondary to C5 ASIA D due to osteomyelitis which require 3+ hours per day of interdisciplinary therapy in a comprehensive inpatient rehab setting.  Physiatrist is providing close team supervision and 24 hour management of active medical problems listed below.  Physiatrist and rehab team continue to assess barriers to discharge/monitor patient progress toward functional and medical goals  Care Tool:  Bathing    Body parts bathed by patient: Chest, Abdomen, Right upper leg, Left upper leg, Face   Body parts bathed by helper: Right arm, Left arm, Front perineal area, Buttocks, Left lower leg, Right lower leg     Bathing assist Assist Level: 2 Helpers     Upper Body Dressing/Undressing Upper body dressing   What is the patient wearing?: Pull over shirt    Upper body assist Assist Level: Maximal Assistance - Patient 25 - 49%    Lower Body Dressing/Undressing Lower body dressing      What is the patient wearing?: Incontinence brief, Pants     Lower body assist Assist for lower body dressing: 2 Helpers     Toileting Toileting    Toileting assist Assist for toileting: 2 Helpers     Transfers Chair/bed transfer  Transfers assist     Chair/bed transfer  assist level: 2 Helpers     Locomotion Ambulation   Ambulation assist      Assist level: 2 helpers Assistive device: Other (comment)(3 muskateers) Max distance: 25'   Walk 10 feet activity   Assist     Assist level: 2 helpers Assistive device: Other (comment)(3 muskateers)   Walk 50 feet activity   Assist Walk 50 feet with 2 turns activity did not occur: Safety/medical concerns         Walk 150 feet activity   Assist Walk 150 feet activity did not occur: Safety/medical concerns         Walk 10 feet on uneven surface  activity   Assist Walk 10 feet on uneven  surfaces activity did not occur: Safety/medical concerns         Wheelchair     Assist Will patient use wheelchair at discharge?: Yes Type of Wheelchair: Manual    Wheelchair assist level: Dependent - Patient 0% Max wheelchair distance: 150'    Wheelchair 50 feet with 2 turns activity    Assist        Assist Level: Dependent - Patient 0%   Wheelchair 150 feet activity     Assist      Assist Level: Dependent - Patient 0%   Blood pressure 99/75, pulse 86, temperature 97.8 F (36.6 C), temperature source Oral, resp. rate 18, height 5\' 9"  (1.753 m), weight 52.6 kg, SpO2 100 %.  Medical Problem List and Plan: 1.  Quadriplegia secondary to C5 incomplete ASIA C initially, now D spinal cord injury.S/P C3-4 4-55-6 and C6-7 anterior cervical decompression and fusion 01/04/2020.  Cervical brace as directed             -patient may  Shower with incision covered             -ELOS/Goals: mod I to supervision 3-4 weeks 2.  Antithrombotics/pulmonary emboli per CT angiogram of chest 12/20/2019: Lower extremity Doppler negative.  Continue intravenous heparin awaiting plan transition to oral agent -DVT/anticoagulation: Intravenous heparin transition to Eliquis 01/11/2020             -antiplatelet therapy: N/A 3. Pain Management: Oxycodone and Robaxin as needed  3/25- will d/w pt tomorrow- see if can transition to Norco as of yet? 4. Mood: Provide emotional support             -antipsychotic agents: N/A 5. Neuropsych: This patient is capable of making decisions on her own behalf. 6. Skin/Wound Care: Routine skin checks  3/25- note made pt might have pressure ulcer- will assess 7. Fluids/Electrolytes/Nutrition: Routine in and outs with follow-up chemistries 8.  Anemia.  EGD completed 12/27/2019 per GI services.  Recommend PPI x2 months.  H. pylori serology positive.  Complete course of amoxicillin and Biaxin 9.  Multiple dental caries.  Follow-up dental services after multiple  extractions 12/30/2019 10.  Neurogenic bowel  due to SCI.  Check PVR x3. If retaining, will start Flomax-  Adjust bowel program- hasn't had B x5 days- only gas- will order sorbitol (didn't have control last BM)- might need bowel program.   3/25- will start on bowel program for now- might not need long term if gets control 11 Streptococcal bacteremia.  Intravenous ceftriaxone completed. 12.  History of tobacco use.  Counseling 13. Spasticity- improved since surgery- for now, doesn't need spasticity agents-  14. Neurogenic bladder  Pt was started on Flomax- working well- no caths required in last 24 hours- will monitor.  LOS: 2 days A FACE TO FACE EVALUATION WAS PERFORMED  Christyn Gutkowski 01/13/2020, 9:59 AM

## 2020-01-14 ENCOUNTER — Inpatient Hospital Stay (HOSPITAL_COMMUNITY): Payer: Commercial Managed Care - PPO

## 2020-01-14 NOTE — Progress Notes (Signed)
Physical Therapy Session Note  Patient Details  Name: Anna Campos MRN: [id number] Date of Birth: 09-Aug-1966  Today's Date: 01/14/2020 PT Individual Time: 1115-1215 PT Individual Time Calculation (min): 60 min   Short Term Goals: Week 1:  PT Short Term Goal 1 (Week 1): Pt will complete least restrictive transfers with assist x 1 PT Short Term Goal 2 (Week 1): Pt will ambulate x 50 ft with LRAD PT Short Term Goal 3 (Week 1): Pt will maintain static standing balance with min A Week 2:    Week 3:     Skilled Therapeutic Interventions/Progress Updates:    PAIN Mild upper shoulders/3/10 treatment to tolerance  Pt initially OOB in recliner on phone with bank, treatment delayed by 10 min.   STS from recliner w/2 attempts and cues for manintaining ant wt shift long enough before transitioning to extension, completes w/mod assist. SPT to wc w/mod assist, cues for sequencing and wt shifting as well as ant wt shift w/stand to sit.  Pt transported to gym for continuation of session.  The following performed to Improve core stability, wbing thru hips and shoulders to promote proximal stabilization. STS from wc as above. In standing worked on static midline posture esp at hips/pelvis.  Stand to tall kneeling on mat w/max assist of 2, cues for sequencing/wt shifting. Worked on tall kneeling to sitting on haunches w/progressive increased flexion, cga and cues for midline, occasional max assist when pt went into too much flexion and unable to recover at hips/trunk.  Hands supported on blue bench  Tall kneeling to quaduped w/forearms on blue bench and hips/knees 90/90w/mod assist, max assist for repositioing of LE's as needed  In this posiiton worked on: Cat/camel lower trunk flex/extension x 10 Reaching w/alternating UE flexion w/min assist for trunk control, cues for midline Isometric resistance laterally to hips x 5 counts rpeated x 5 each side. Pt rested in sitting x 4-5 min between each set of  exercises.  Requires total assist for quadruped to sitting on edge of mat.  STS w/min assist of 2 w/good mechanics, cues.  Standing w/elevated tray table worked on controlled short step sidestepping w/emphasis on sequencing of wtshifting and focus on maintaining neutral pelvis/alignment at hips, mod assist.  Backing from table to mat 4 steps w/max assist. Mat to wc SPT w/mod assist of 1, cues as above. Pt transported to room.  Wc to recliner w/mod assist, as above w/recliner to wc. Pt left oob in recliner w/chair alarm set and needs in reach, husband in room w/pt.    Therapy Documentation Precautions:  Precautions Precautions: Fall, Cervical Precaution Comments: reviewed cervical precautions; orthostatic Required Braces or Orthoses: Cervical Brace Cervical Brace: Soft collar(collar can be removed for showering per Dr. Berline Chough) Restrictions Weight Bearing Restrictions: No Other Position/Activity Restrictions: collar at all times per chart   Therapy/Group: Individual Therapy  Rada Hay, PT   Shearon Balo 01/14/2020, 12:41 PM

## 2020-01-14 NOTE — Progress Notes (Signed)
Occupational Therapy Session Note  Patient Details  Name: Anna Campos MRN: [id number] Date of Birth: 24-Aug-1966  Today's Date: 01/14/2020 OT Individual Time: 8185-6314 OT Individual Time Calculation (min): 75 min    Short Term Goals: Week 1:  OT Short Term Goal 1 (Week 1): Pt will self feed using universal cuff or foam grips and the RUE for 75% of meal with no more than min assist. OT Short Term Goal 2 (Week 1): Pt will complete UB bathing with min assist for 2 consecutive sessions with use of a washmit PRN. OT Short Term Goal 3 (Week 1): Pt will perform toilet transfer stand pivot with mod assist to the 3:1. OT Short Term Goal 4 (Week 1): Pt will complete UB dressing with min assist for pullover shirt only. OT Short Term Goal 5 (Week 1): Pt/family will return demonstrate safe completion of AAROM exercises for BUE following handout.  Skilled Therapeutic Interventions/Progress Updates:    1;1. Pt agreeable to OT session with OT increasing accessibility features allowing pt to double tap phone to activate lock screen as opposed to pushing side button. Pt able to use facial recognition to unlock phone if positioned with modular hose. Pt completes supine>sitting EOB with CGA and sit to stand/ambulates with MIN A and B PFRW with VC for wider stepping pattern progressing from step to pattern to step through pattern. Pt completes table top activity reaching and grabbing blocks with VC for faciliation of tenodesis grasp on R and decreasing shoulder hike/abduction on R as compensatory movement. Pt completes 4x10 sec isometric hold of wrist extension at tabletop and scap protraction/retraction with demo/tactile cues. Pt completes shoulder AAROM with BUE coban to no weight rod for shoulder flex/ext, elbow flex/ext (min manual resistance), and chest press. Exited session with pt seated in recliner, call light in reach and all needs met  Therapy Documentation Precautions:  Precautions Precautions: Fall,  Cervical Precaution Comments: reviewed cervical precautions; orthostatic Required Braces or Orthoses: Cervical Brace Cervical Brace: Soft collar(collar can be removed for showering per Dr. Dagoberto Ligas) Restrictions Weight Bearing Restrictions: No Other Position/Activity Restrictions: collar at all times per chart General:   Vital Signs:   Pain:   ADL: ADL Equipment Provided: Feeding equipment Eating: Maximal assistance Where Assessed-Eating: Chair Grooming: Maximal assistance Where Assessed-Grooming: Wheelchair Upper Body Bathing: Maximal assistance Where Assessed-Upper Body Bathing: Wheelchair Lower Body Bathing: Dependent Where Assessed-Lower Body Bathing: Sitting at sink, Standing at sink, Wheelchair Upper Body Dressing: Maximal assistance Where Assessed-Upper Body Dressing: Wheelchair, Sitting at sink Lower Body Dressing: Dependent Where Assessed-Lower Body Dressing: Wheelchair Toileting: Dependent Where Assessed-Toileting: Bedside Commode Toilet Transfer: Dependent Armed forces technical officer Method: Arts development officer: Art gallery manager    Praxis   Exercises:   Other Treatments:     Therapy/Group: Individual Therapy  Tonny Branch 01/14/2020, 10:33 AM

## 2020-01-14 NOTE — Progress Notes (Signed)
Physical Therapy Session Note  Patient Details  Name: Anna Campos MRN: [id number] Date of Birth: Jan 31, 1966  Today's Date: 01/14/2020 PT Individual Time: 1430-1526 PT Individual Time Calculation (min): 56 min   Short Term Goals: Week 1:  PT Short Term Goal 1 (Week 1): Pt will complete least restrictive transfers with assist x 1 PT Short Term Goal 2 (Week 1): Pt will ambulate x 50 ft with LRAD PT Short Term Goal 3 (Week 1): Pt will maintain static standing balance with min A  Skilled Therapeutic Interventions/Progress Updates:   Received pt sitting in recliner, pt agreeable to therapy, and denied any pain during session. Husband present at bedside. Session focused on functional mobility/transfers, LE strength, dynamic standing balance/coordination, gait training, and improved endurance with activity. Pt transferred recliner<>WC stand<>pivot without AD mod A. Pt transported to gym in [facility] total assist for time management purposes. Pt transferred stand<>pivot WC<>mat without AD mod A. Pt transferred sit<>stand with PFRW min A and performed standing mini-squats 2x5 reps min A with verbal and tactile cues for technique. Pt with poor hip extension and decreased eccentric control upon descent. Pt ambulated 32ft with PFRW mod A +2 for steering PFRW. Pt required tactile cues for glute activation/weight acceptance and verbal cues for step length and foot placement. Pt demonstrated mild hip adduction and bilateral Trendelenburg hip drop with gait. Pt ambulated additional 23ft with PFRW mod A +2 for steering with 3lb ankle weights and the same cues. Pt demonstrated improvements with control with limb advancement, smoother gait pattern, and equal steppage. Pt required rest breaks throughout session due to increased fatigue. Pt performed standing alternating marching with PFRW x10 bilaterally with min A and verbal cues for technique. Pt was educated on reciprocal scooting technique and anterior weight shifting when  standing and demonstrated good carry over. Pt transported back to room in [facility] total assist and requested to return to bed. Stand<>pivot WC<>bed without AD mod A and sit<>supine with min A for LE management. Concluded session with pt supine in bed, needs within reach, and bed alarm on.   Therapy Documentation Precautions:  Precautions Precautions: Fall, Cervical Precaution Comments: reviewed cervical precautions; orthostatic Required Braces or Orthoses: Cervical Brace Cervical Brace: Soft collar(collar can be removed for showering per Dr. Berline Chough) Restrictions Weight Bearing Restrictions: No Other Position/Activity Restrictions: collar at all times per chart  Therapy/Group: Individual Therapy Martin Majestic PT, DPT   01/14/2020, 7:20 AM

## 2020-01-14 NOTE — Progress Notes (Signed)
Patient was c/o pain to the back of her neck that extended to her shoulder & forearm. She stated that she has had this type of pain for the last 3 mornings. The pain was 10/10 on the pain scale & is on the same side that she fell on. Her vitals are WNL. Her prn pain medication was given. She is emptying when she voids as per PVRs. Will continue to monitor

## 2020-01-14 NOTE — Care Management (Signed)
[facility] Individual Statement of Services  Patient Name:  Anna Campos  Date:  01/14/2020  Welcome to the [facility].  Our goal is to provide you with an individualized program based on your diagnosis and situation, designed to meet your specific needs.  With this comprehensive rehabilitation program, you will be expected to participate in at least 3 hours of rehabilitation therapies Monday-Friday, with modified therapy programming on the weekends.  Your rehabilitation program will include the following services:  Physical Therapy (PT), Occupational Therapy (OT), Speech Therapy (ST), 24 hour per day rehabilitation nursing, Therapeutic Recreaction (TR), Psychology, Neuropsychology, Case Management (Social Worker), Rehabilitation Medicine, Nutrition Services, Pharmacy Services and Other  Weekly team conferences will be held on Tuesdays to discuss your progress.  Your Social Worker will talk with you frequently to get your input and to update you on team discussions.  Team conferences with you and your family in attendance may also be held.  Expected length of stay: 21-25 days    Overall anticipated outcome: Minimal Assistance  Depending on your progress and recovery, your program may change. Your Social Worker will coordinate services and will keep you informed of any changes. Your Social Worker's name and contact numbers are listed  below.  The following services may also be recommended but are not provided by the [facility]:   Driving Evaluations  Home Health Rehabiltiation Services  Outpatient Rehabilitation Services  Vocational Rehabilitation   Arrangements will be made to provide these services after discharge if needed.  Arrangements include referral to agencies that provide these services.  Your insurance has been verified to be:  [facility] [employer]  Your primary doctor is:  Farris Has  Pertinent information will be shared with  your doctor and your insurance company.  Social Worker:  Cecile Sheerer, LCSWA  Information discussed with and copy given to patient by: Gretchen Short, 01/14/2020, 4:45 PM

## 2020-01-14 NOTE — Progress Notes (Signed)
[facility] PHYSICAL MEDICINE & REHABILITATION PROGRESS NOTE   Subjective/Complaints:   Pt reports body very sore after therapy yesterday- Had BM on BSC last night- family transferred her.   No caths needed- PVRs  "zero".   Asking for FMLA paperwork to be filled out.  ROS:   Pt denies SOB, abd pain, CP, N/V/C/D, and vision changes   Objective:   No results found. Recent Labs    01/12/20 0525  WBC 9.0  HGB 11.0*  HCT 34.4*  PLT 477*   Recent Labs    01/12/20 0525  NA 133*  K 4.3  CL 95*  CO2 27  GLUCOSE 119*  BUN 15  CREATININE 0.85  CALCIUM 10.0    Intake/Output Summary (Last 24 hours) at 01/14/2020 1707 Last data filed at 01/14/2020 1653 Gross per 24 hour  Intake 600 ml  Output 94 ml  Net 506 ml     Physical Exam: Vital Signs Blood pressure 112/81, pulse 90, temperature 98.2 F (36.8 C), resp. rate 18, height 5\' 9"  (1.753 m), weight 52.4 kg, SpO2 99 %.  Nursing note and vitals reviewed. Constitutional: pt awake, alert, appropriate, sitting up in bed, NAD HENT:  Wearing soft collar- has honeycomb dressing on anterior cervical incision  Neck: No tracheal deviation present.  Decreased cervical ROM Cardiovascular: RRR no JVD Respiratory: CTA B/L- no accessory muscle use , NT,  ND, (+) BS Musculoskeletal:     Cervical back: Neck supple.     Comments: RUE- bicep 5/5, WE 4/5, tricep 3/5, grip 2/5, finger 0/5 LUE_ bicep 5/5, WE 4/5, triceps 2/5, grip 3/5, finger 1/5 RLE- HF 2/5, KE 4/5, DF 2/5, PF 4/5, EHL 3/5 LLE- 4/5 in all muscles detailed above Neurological: Ox3; no hoffman's seen today B/L No clonus or increased tone IN UEs/LEs Sensation intact to light touch in all 4 extremities and torso B/L IV out Psychiatric: bright, bubbly Skin- assessed backside- has a tiny spot on coccyx due to either shear injury or scrape; and on R inner buttock- NOT PRESSURE ULCER  Assessment/Plan: 1. Functional deficits secondary to C5 ASIA D due to  osteomyelitis which require 3+ hours per day of interdisciplinary therapy in a comprehensive inpatient rehab setting.  Physiatrist is providing close team supervision and 24 hour management of active medical problems listed below.  Physiatrist and rehab team continue to assess barriers to discharge/monitor patient progress toward functional and medical goals  Care Tool:  Bathing    Body parts bathed by patient: Chest, Abdomen, Right upper leg, Left upper leg, Face   Body parts bathed by helper: Right arm, Left arm, Front perineal area, Buttocks, Left lower leg, Right lower leg     Bathing assist Assist Level: 2 Helpers     Upper Body Dressing/Undressing Upper body dressing   What is the patient wearing?: Pull over shirt    Upper body assist Assist Level: Maximal Assistance - Patient 25 - 49%    Lower Body Dressing/Undressing Lower body dressing      What is the patient wearing?: Incontinence brief, Pants     Lower body assist Assist for lower body dressing: 2 Helpers     Toileting Toileting    Toileting assist Assist for toileting: 2 Helpers     Transfers Chair/bed transfer  Transfers assist     Chair/bed transfer assist level: Moderate Assistance - Patient 50 - 74%     Locomotion Ambulation   Ambulation assist      Assist level: 2 helpers Assistive  device: Walker-platform Max distance: 77ft   Walk 10 feet activity   Assist     Assist level: 2 helpers Assistive device: Walker-platform   Walk 50 feet activity   Assist Walk 50 feet with 2 turns activity did not occur: Safety/medical concerns  Assist level: 2 helpers Assistive device: Walker-platform    Walk 150 feet activity   Assist Walk 150 feet activity did not occur: Safety/medical concerns         Walk 10 feet on uneven surface  activity   Assist Walk 10 feet on uneven surfaces activity did not occur: Safety/medical concerns         Wheelchair     Assist Will  patient use wheelchair at discharge?: Yes Type of Wheelchair: Manual    Wheelchair assist level: Dependent - Patient 0% Max wheelchair distance: 150'    Wheelchair 50 feet with 2 turns activity    Assist        Assist Level: Dependent - Patient 0%   Wheelchair 150 feet activity     Assist      Assist Level: Dependent - Patient 0%   Blood pressure 112/81, pulse 90, temperature 98.2 F (36.8 C), resp. rate 18, height 5\' 9"  (1.753 m), weight 52.4 kg, SpO2 99 %.  Medical Problem List and Plan: 1.  Quadriplegia secondary to C5 incomplete ASIA C initially, now D spinal cord injury.S/P C3-4 4-55-6 and C6-7 anterior cervical decompression and fusion 01/04/2020.  Cervical brace as directed             -patient may  Shower with incision covered             -ELOS/Goals: mod I to supervision 3-4 weeks 2.  Antithrombotics/pulmonary emboli per CT angiogram of chest 12/20/2019: Lower extremity Doppler negative.  Continue intravenous heparin awaiting plan transition to oral agent -DVT/anticoagulation: Intravenous heparin transition to Eliquis 01/11/2020             -antiplatelet therapy: N/A 3. Pain Management: Oxycodone and Robaxin as needed  3/26- pt says pain too bad overnight to change yet 4. Mood: Provide emotional support             -antipsychotic agents: N/A 5. Neuropsych: This patient is capable of making decisions on her own behalf. 6. Skin/Wound Care: Routine skin checks  3/26- no pressure ulcer- was shear, scrape-  7. Fluids/Electrolytes/Nutrition: Routine in and outs with follow-up chemistries 8.  Anemia.  EGD completed 12/27/2019 per GI services.  Recommend PPI x2 months.  H. pylori serology positive.  Complete course of amoxicillin and Biaxin 9.  Multiple dental caries.  Follow-up dental services after multiple extractions 12/30/2019 10.  Neurogenic bowel  due to SCI.  Check PVR x3. If retaining, will start Flomax-  Adjust bowel program- hasn't had B x5 days- only gas- will  order sorbitol (didn't have control last BM)- might need bowel program.   3/25- will start on bowel program for now- might not need long term if gets control  3/26- had BM on BSC last night 11 Streptococcal bacteremia.  Intravenous ceftriaxone completed. 12.  History of tobacco use.  Counseling 13. Spasticity- improved since surgery- for now, doesn't need spasticity agents-  14. Neurogenic bladder  Pt was started on Flomax- working well- no caths required in last 24 hours- will monitor.   3/26- no Evette Doffing PVRs so looking great      LOS: 3 days A FACE TO FACE EVALUATION WAS PERFORMED  Lanier Felty 01/14/2020, 5:07 PM

## 2020-01-14 NOTE — Progress Notes (Addendum)
Foam dressings were placed over sacral & right buttock pressure injuries. The areas look different than yesterday. They were open with a pink wound bed. Both areas were cleansed with normal saline & patient was informed. She was educated yesterday about relieving the pressure off the areas by shifting in her chair & encouraged not to sit too long in her recliner. Tonight, she was in her bed more than last night. She tried to have another bowel movement but stated that it was just gas. Her rectal vault was checked & she had soft stool that was farther up & could not be pulled down to the surface. Dig stem was performed again with discomfort expressed, no results at the time. She did not c/o pain to her neck & did not ask for pain relief. Will continue to monitor.

## 2020-01-14 NOTE — IPOC Note (Signed)
Overall Plan of Care [facility]) Patient Details Name: Anna Campos MRN: [id number] DOB: [birth year]  Admitting Diagnosis: Incomplete quadriplegia at C5-6 level [facility])  Hospital Problems: Principal Problem:   Incomplete quadriplegia at C5-6 level [facility]) Active Problems:   Neurogenic bowel   Neurogenic bladder     Functional Problem List: Nursing Bladder, Bowel, Endurance, Medication Management, Motor, Nutrition, Pain, Safety, Sensory  PT Balance, Endurance, Motor, Pain, Safety, Sensory  OT Balance, Motor, Sensory  SLP    TR         Basic ADL's: OT Eating, Grooming, Bathing, Dressing, Toileting     Advanced  ADL's: OT Simple Meal Preparation     Transfers: PT Bed Mobility, Bed to Chair, Car, Furniture, Floor  OT Toilet, Tub/Shower     Locomotion: PT Ambulation, Emergency planning/management officer, Stairs     Additional Impairments: OT Fuctional Use of Upper Extremity  SLP        TR      Anticipated Outcomes Item Anticipated Outcome  Self Feeding supervision  Swallowing      Basic self-care  min assist  Toileting  min assist   Bathroom Transfers min assist  Bowel/Bladder  manage bowel and bladder with min Assist  Transfers  min A  Locomotion  min A with LRAD  Communication     Cognition     Pain  pain level less than 4 on scale of 0-10  Safety/Judgment  remain free of injury, prevent falls with mod I assist   Therapy Plan: PT Intensity: Minimum of 1-2 x/day ,45 to 90 minutes PT Frequency: 5 out of 7 days PT Duration Estimated Length of Stay: 21-25 days OT Intensity: Minimum of 1-2 x/day, 45 to 90 minutes OT Frequency: 5 out of 7 days OT Duration/Estimated Length of Stay: 21-24 days     Due to the current state of emergency, patients may not be receiving their 3-hours of Medicare-mandated therapy.   Team Interventions: Nursing Interventions Pain Management, Patient/Family Education, Bladder Management, Discharge Planning, Medication Management, Skin Care/Wound  Management, Psychosocial Support, Disease Management/Prevention, Cognitive Remediation/Compensation, Bowel Management  PT interventions Ambulation/gait training, Balance/vestibular training, Community reintegration, Discharge planning, DME/adaptive equipment instruction, Functional electrical stimulation, Functional mobility training, Neuromuscular re-education, Pain management, Patient/family education, Psychosocial support, Splinting/orthotics, Stair training, Therapeutic Activities, Therapeutic Exercise, UE/LE Strength taining/ROM, UE/LE Coordination activities, Wheelchair propulsion/positioning  OT Interventions Balance/vestibular training, Discharge planning, Functional electrical stimulation, Pain management, Self Care/advanced ADL retraining, Therapeutic Activities, UE/LE Coordination activities, Therapeutic Exercise, Patient/family education, Functional mobility training, Community reintegration, DME/adaptive equipment instruction, Neuromuscular re-education, Splinting/orthotics, UE/LE Strength taining/ROM, Wheelchair propulsion/positioning  SLP Interventions    TR Interventions    SW/CM Interventions Psychosocial Support, Patient/Family Education, Discharge Planning   Barriers to Discharge MD  Medical stability, Home enviroment access/loayout, Neurogenic bowel and bladder, Wound care, Lack of/limited family support, Weight and Weight bearing restrictions  Nursing      PT      OT      SLP      SW       Team Discharge Planning: Destination: PT-Home ,OT- Home , SLP-  Projected Follow-up: PT-Home health PT, 24 hour supervision/assistance, OT-  Home health OT, 24 hour supervision/assistance, SLP-  Projected Equipment Needs: PT-To be determined, OT- 3 in 1 bedside comode, Tub/shower bench, SLP-  Equipment Details: PT-TBD pending progress, OT-  Patient/family involved in discharge planning: PT- Patient, Family member/caregiver,  OT-Patient, SLP-   MD ELOS: 21-25 days Medical Rehab  Prognosis:  Fair Assessment: Pt is a 54 yr old  female with incomplete ASIA D C5 SCI/quadriplegia- with some neurogenic bowel and bladder- no spasticity anymore after surgery- due to osteomyelitis and had to get teeth removed.   Goals Min assist    See Team Conference Notes for weekly updates to the plan of care

## 2020-01-15 DIAGNOSIS — N319 Neuromuscular dysfunction of bladder, unspecified: Secondary | ICD-10-CM

## 2020-01-15 DIAGNOSIS — L8990 Pressure ulcer of unspecified site, unspecified stage: Secondary | ICD-10-CM | POA: Insufficient documentation

## 2020-01-15 DIAGNOSIS — K592 Neurogenic bowel, not elsewhere classified: Secondary | ICD-10-CM

## 2020-01-15 DIAGNOSIS — E871 Hypo-osmolality and hyponatremia: Secondary | ICD-10-CM

## 2020-01-15 DIAGNOSIS — R7401 Elevation of levels of liver transaminase levels: Secondary | ICD-10-CM

## 2020-01-15 DIAGNOSIS — D649 Anemia, unspecified: Secondary | ICD-10-CM

## 2020-01-15 MED ORDER — DARIFENACIN HYDROBROMIDE ER 7.5 MG PO TB24
7.5000 mg | ORAL_TABLET | Freq: Every day | ORAL | Status: DC
  Administered 2020-01-15 – 2020-02-05 (×22): 7.5 mg via ORAL
  Filled 2020-01-15 (×22): qty 1

## 2020-01-15 NOTE — Progress Notes (Signed)
At 1820 bowel program started. Dig stim performed for 15 seconds prior to suppository insertion. Rectal vault clear. At 1845 patient placed on beside commode and patient had medium amount of semi hard stool. Dig stim performed again and small amount of stool removed from rectal vault. Patient voided continent on bsc as well.

## 2020-01-15 NOTE — Progress Notes (Signed)
[facility] PHYSICAL MEDICINE & REHABILITATION PROGRESS NOTE   Subjective/Complaints: Patient seen laying in bed this morning.  She states she slept well overnight.  ROS: Denies shortness of breath, CP, N/V/D  Objective:   No results found. No results for input(s): WBC, HGB, HCT, PLT in the last 72 hours. No results for input(s): NA, K, CL, CO2, GLUCOSE, BUN, CREATININE, CALCIUM in the last 72 hours.  Intake/Output Summary (Last 24 hours) at 01/15/2020 1542 Last data filed at 01/15/2020 0900 Gross per 24 hour  Intake 240 ml  Output 261 ml  Net -21 ml     Physical Exam: Vital Signs Blood pressure 95/64, pulse 83, temperature 99.4 F (37.4 C), temperature source Oral, resp. rate 16, height 5\' 9"  (1.753 m), weight 52.5 kg, SpO2 100 %. Constitutional: No distress . Vital signs reviewed. HENT: Normocephalic.  Atraumatic.  Soft collar in place. Eyes: EOMI. No discharge. Cardiovascular: No JVD. Respiratory: Normal effort.  No stridor. GI: Non-distended. Skin: Warm and dry.  Incision wound with dressing C/D/I Psych: Normal mood.  Normal behavior. Musc: No edema in extremities.  No tenderness in extremities. Neuro: Alert Motor: RUE: Shoulder abduction 2/5, elbow flexion/extension 3/5, handgrip 2/5 LUE: Shoulder abduction 2+/5 proximal distal, elbow flexion/extension 3/5, and grip 3/5 Left lower extremity: 4/5 proximal distal Right lower extremity: Hip flexion, knee extension 3/5, ankle dorsiflexion 2/5 Bilateral lower extremities: 4 medicine/5 proximal distal  Assessment/Plan: 1. Functional deficits secondary to C5 ASIA D due to osteomyelitis which require 3+ hours per day of interdisciplinary therapy in a comprehensive inpatient rehab setting.  Physiatrist is providing close team supervision and 24 hour management of active medical problems listed below.  Physiatrist and rehab team continue to assess barriers to discharge/monitor patient progress toward functional and medical  goals  Care Tool:  Bathing    Body parts bathed by patient: Chest, Abdomen, Right upper leg, Left upper leg, Face   Body parts bathed by helper: Right arm, Left arm, Front perineal area, Buttocks, Left lower leg, Right lower leg     Bathing assist Assist Level: 2 Helpers     Upper Body Dressing/Undressing Upper body dressing   What is the patient wearing?: Pull over shirt    Upper body assist Assist Level: Maximal Assistance - Patient 25 - 49%    Lower Body Dressing/Undressing Lower body dressing      What is the patient wearing?: Incontinence brief, Pants     Lower body assist Assist for lower body dressing: 2 Helpers     Toileting Toileting    Toileting assist Assist for toileting: 2 Helpers     Transfers Chair/bed transfer  Transfers assist     Chair/bed transfer assist level: Moderate Assistance - Patient 50 - 74%     Locomotion Ambulation   Ambulation assist      Assist level: 2 helpers Assistive device: Walker-platform Max distance: 59ft   Walk 10 feet activity   Assist     Assist level: 2 helpers Assistive device: Walker-platform   Walk 50 feet activity   Assist Walk 50 feet with 2 turns activity did not occur: Safety/medical concerns  Assist level: 2 helpers Assistive device: Walker-platform    Walk 150 feet activity   Assist Walk 150 feet activity did not occur: Safety/medical concerns         Walk 10 feet on uneven surface  activity   Assist Walk 10 feet on uneven surfaces activity did not occur: Safety/medical concerns  Wheelchair     Assist Will patient use wheelchair at discharge?: Yes Type of Wheelchair: Manual    Wheelchair assist level: Dependent - Patient 0% Max wheelchair distance: 150'    Wheelchair 50 feet with 2 turns activity    Assist        Assist Level: Dependent - Patient 0%   Wheelchair 150 feet activity     Assist      Assist Level: Dependent - Patient 0%    Blood pressure 95/64, pulse 83, temperature 99.4 F (37.4 C), temperature source Oral, resp. rate 16, height 5\' 9"  (1.753 m), weight 52.5 kg, SpO2 100 %.  Medical Problem List and Plan: 1.  Quadriplegia secondary to C5 incomplete ASIA C initially, now D spinal cord injury.S/P C3-4 4-55-6 and C6-7 anterior cervical decompression and fusion 01/04/2020.  Cervical brace as directed  Continue CIR  2.  Antithrombotics/pulmonary emboli per CT angiogram of chest 12/20/2019: Lower extremity Doppler negative.   -DVT/anticoagulation: Intravenous heparin transitioned to Eliquis 01/11/2020             -antiplatelet therapy: N/A 3. Pain Management: Oxycodone and Robaxin as needed  Controlled with meds on 3/27 4. Mood: Provide emotional support             -antipsychotic agents: N/A 5. Neuropsych: This patient is capable of making decisions on her own behalf. 6. Skin/Wound Care: Routine skin checks 7. Fluids/Electrolytes/Nutrition: Routine in and outs 8.  Anemia.  EGD completed 12/27/2019 per GI services.  Recommend PPI x2 months.  H. pylori serology positive.  Complete course of amoxicillin and Biaxin   Hemoglobin 11.0 on 3/24  Continue to monitor 9. Multiple dental caries.  Follow-up dental services after multiple extractions 12/30/2019 10. Neurogenic bowel due to SCI.    Continue bowel program 11 Streptococcal bacteremia.  Intravenous ceftriaxone completed. 12.  History of tobacco use.  Counseling 13. Spasticity- improved since surgery- for now, doesn't need spasticity agents-  14. Neurogenic bladder/frequency  Pt was started on Flomax- working well- no caths required in last 24 hours- will monitor.   PVRs with low volumes  Trial Enablex 7.5 on 3/27 15.  Hyponatremia  Sodium 133 on 3/24, labs ordered for Monday 16.  Transaminitis  LFTs elevated on 3/24, labs ordered for Monday  LOS: 4 days A FACE TO FACE EVALUATION WAS PERFORMED  Anna Campos Lorie Phenix 01/15/2020, 3:42 PM

## 2020-01-15 NOTE — Plan of Care (Signed)
  Problem: Consults Goal: RH SPINAL CORD INJURY PATIENT EDUCATION Description:  See Patient Education module for education specifics.  Outcome: Progressing   Problem: SCI BOWEL ELIMINATION Goal: RH STG MANAGE BOWEL WITH ASSISTANCE Description: STG Manage Bowel with mod I Assistance. Outcome: Progressing   Problem: SCI BLADDER ELIMINATION Goal: RH STG MANAGE BLADDER WITH ASSISTANCE Description: STG Manage Bladder With Mod I Assistance Outcome: Progressing   Problem: RH SKIN INTEGRITY Goal: RH STG MAINTAIN SKIN INTEGRITY WITH ASSISTANCE Description: STG Maintain Skin Integrity With mod I Assistance. Outcome: Progressing   Problem: RH SAFETY Goal: RH STG ADHERE TO SAFETY PRECAUTIONS W/ASSISTANCE/DEVICE Description: STG Adhere to Safety Precautions With cues and reminders  Outcome: Progressing Goal: RH STG DECREASED RISK OF FALL WITH ASSISTANCE Description: STG Decreased Risk of Fall With mod I Assistance. Outcome: Progressing   Problem: RH PAIN MANAGEMENT Goal: RH STG PAIN MANAGED AT OR BELOW PT'S PAIN GOAL Description: Pain level less than 4 on scale of 0-10 Outcome: Progressing   Problem: RH KNOWLEDGE DEFICIT SCI Goal: RH STG INCREASE KNOWLEDGE OF SELF CARE AFTER SCI Description: Pt will be able to manage bowel program with min assist from family. Pt will be able to adhere to medication regimen with mod I assist from family.  Outcome: Progressing   

## 2020-01-15 NOTE — Progress Notes (Signed)
Per report from day shift, bowel program to performed. Bowel program was done on night shift due to time constraints. Dig stim was performed and hard stool was felt. Suppository was administered @2037 . Pt had BM @2111 , type 1 stool, small, and brown. Pt stated she felt less constipated after BM. Second dig stim was performed and her rectal cavity was empty. Pt currently resting in bed, will continue to monitor.

## 2020-01-16 ENCOUNTER — Inpatient Hospital Stay (HOSPITAL_COMMUNITY): Payer: Commercial Managed Care - PPO

## 2020-01-16 ENCOUNTER — Inpatient Hospital Stay (HOSPITAL_COMMUNITY): Payer: Commercial Managed Care - PPO | Admitting: Occupational Therapy

## 2020-01-16 NOTE — Progress Notes (Signed)
Patient had continent bowel movement with therapy this afternoon. Suppository inserted about 1930. Prior to insertion dig stim performed for about 15 seconds. Very small amount of soft formed stool noted. Patient left in side lying position with call bell in reach. Reported off to on coming nurse.

## 2020-01-16 NOTE — Progress Notes (Signed)
Pt had large BM after receiving bowel program. Tolerated well.

## 2020-01-16 NOTE — Progress Notes (Signed)
Occupational Therapy Session Note  Patient Details  Name: Anna Campos MRN: [id number] Date of Birth: 26-Aug-1966  Today's Date: 01/16/2020 OT Individual Time: 9476-5465 and 1300-1359 OT Individual Time Calculation (min): 57 min and 59 min  Short Term Goals: Week 1:  OT Short Term Goal 1 (Week 1): Pt will self feed using universal cuff or foam grips and the RUE for 75% of meal with no more than min assist. OT Short Term Goal 2 (Week 1): Pt will complete UB bathing with min assist for 2 consecutive sessions with use of a washmit PRN. OT Short Term Goal 3 (Week 1): Pt will perform toilet transfer stand pivot with mod assist to the 3:1. OT Short Term Goal 4 (Week 1): Pt will complete UB dressing with min assist for pullover shirt only. OT Short Term Goal 5 (Week 1): Pt/family will return demonstrate safe completion of AAROM exercises for BUE following handout.  Skilled Therapeutic Interventions/Progress Updates:    Pt greeted in bed, wearing soft c-collar with RN present. Pt reported no pain, asking if she could wash her face. Supine<sit completed with supervision assist with HOB elevated. Mod A for sit<stand and Min A for ambulatory transfer to w/c parked beside sink using PF RW. While seated, pt was set up with her hand mitt and universal cuff to engage in face washing and oral care for UE NMR. She also completed handwashing. Min A to manage faucet levers and and rotate her toothbrush when it was positioned in cuff. Afterwards pt engaged in dressing tasks sit<stand at the sink. Pt needed Mod A for power ups with vcs for hand placement and reciprocal scooting. Pt needed Max A for dressing tasks due to decreased grip strength bilaterally. Pt remained in the w/c at end of session, left her with all needs within reach. Tx focus placed on adaptive self care skills, sit<stands, and NMR.    2nd Session 1:1 tx (59 min) Pt greeted in recliner, in the middle of her lunch. OT assisted with setup to increase  her independence with eating tasks. Pt able to bring pieces of her burger and grapes to mouth without utensils. Set her up with the universal cuff for Rt hand however due to increased tremor activity, pt wanted to instead use her Lt hand using built up spoon. OT assisted pt with scooping her pudding and pt able to bring spoonful to mouth. Afterwards, to work on UE/LE NMR via meaningful leisure based occupation, pt engaged in dancing. Played her favorite music with pt singing along to verses, incorporating simultaneous UE/LE movement in beat to music. To work on standing balance, pt stood with Mod A while shaking hips during one Rolm Bookbinder song. At end of session pt remained sitting in the recliner with all needs within reach, very grateful for incorporation of dance during session.   Therapy Documentation Precautions:  Precautions Precautions: Fall, Cervical Precaution Comments: reviewed cervical precautions; orthostatic Required Braces or Orthoses: Cervical Brace Cervical Brace: Soft collar(collar can be removed for showering per Dr. Berline Chough) Restrictions Weight Bearing Restrictions: No Other Position/Activity Restrictions: collar at all times per chart Vital Signs: Therapy Vitals Temp: (!) 97.3 F (36.3 C) Pulse Rate: 100 Resp: 16 BP: 99/62 Patient Position (if appropriate): Sitting Oxygen Therapy SpO2: 100 % O2 Device: Room Air Pain: Pain Assessment Pain Scale: 0-10 Pain Score: 8  Pain Type: Acute pain Pain Location: Neck Pain Orientation: Posterior Pain Descriptors / Indicators: Aching Pain Onset: Gradual Pain Intervention(s): Medication (See eMAR) ADL: ADL  Equipment Provided: Feeding equipment Eating: Maximal assistance Where Assessed-Eating: Chair Grooming: Maximal assistance Where Assessed-Grooming: Wheelchair Upper Body Bathing: Maximal assistance Where Assessed-Upper Body Bathing: Wheelchair Lower Body Bathing: Dependent Where Assessed-Lower Body Bathing: Sitting  at sink, Standing at sink, Wheelchair Upper Body Dressing: Maximal assistance Where Assessed-Upper Body Dressing: Wheelchair, Sitting at sink Lower Body Dressing: Dependent Where Assessed-Lower Body Dressing: Wheelchair Toileting: Dependent Where Assessed-Toileting: Bedside Commode Toilet Transfer: Dependent Toilet Transfer Method: Arts development officer: Bedside commode      Therapy/Group: Individual Therapy  Tytus Strahle A Clyde Upshaw 01/16/2020, 4:17 PM

## 2020-01-16 NOTE — Progress Notes (Signed)
[facility] PHYSICAL MEDICINE & REHABILITATION PROGRESS NOTE   Subjective/Complaints: Patient seen laying in bed this morning.  She states she slept well overnight.  She denies complaints.  ROS: Denies shortness of breath, CP, N/V/D  Objective:   No results found. No results for input(s): WBC, HGB, HCT, PLT in the last 72 hours. No results for input(s): NA, K, CL, CO2, GLUCOSE, BUN, CREATININE, CALCIUM in the last 72 hours.  Intake/Output Summary (Last 24 hours) at 01/16/2020 1944 Last data filed at 01/16/2020 1904 Gross per 24 hour  Intake 480 ml  Output 105 ml  Net 375 ml     Physical Exam: Vital Signs Blood pressure 99/62, pulse 100, temperature (!) 97.3 F (36.3 C), resp. rate 16, height 5\' 9"  (1.753 m), weight 54 kg, SpO2 100 %.  Constitutional: No distress . Vital signs reviewed. HENT: Normocephalic.  Atraumatic.  Soft collar in place Eyes: EOMI. No discharge. Cardiovascular: No JVD. Respiratory: Normal effort.  No stridor. GI: Non-distended. Skin: Warm and dry.  Intact. Psych: Normal mood.  Normal behavior. Musc: No edema in extremities.  No tenderness in extremities. Neuro: Alert Motor: RUE: Shoulder abduction 2/5, elbow flexion/extension 3/5, handgrip 2/5, unchanged LUE: Shoulder abduction 2+/5 proximal distal, elbow flexion/extension 3/5, and grip 3 -/5 Left lower extremity: 4/5 proximal distal Right lower extremity: Hip flexion, knee extension 3 +/5, ankle dorsiflexion 2/5  Assessment/Plan: 1. Functional deficits secondary to C5 ASIA D due to osteomyelitis which require 3+ hours per day of interdisciplinary therapy in a comprehensive inpatient rehab setting.  Physiatrist is providing close team supervision and 24 hour management of active medical problems listed below.  Physiatrist and rehab team continue to assess barriers to discharge/monitor patient progress toward functional and medical goals  Care Tool:  Bathing    Body parts bathed by patient: Chest,  Abdomen, Right upper leg, Left upper leg, Face   Body parts bathed by helper: Right arm, Left arm, Front perineal area, Buttocks, Left lower leg, Right lower leg     Bathing assist Assist Level: 2 Helpers     Upper Body Dressing/Undressing Upper body dressing   What is the patient wearing?: Pull over shirt    Upper body assist Assist Level: Maximal Assistance - Patient 25 - 49%    Lower Body Dressing/Undressing Lower body dressing      What is the patient wearing?: Incontinence brief, Pants     Lower body assist Assist for lower body dressing: Maximal Assistance - Patient 25 - 49%     Toileting Toileting    Toileting assist Assist for toileting: 2 Helpers     Transfers Chair/bed transfer  Transfers assist     Chair/bed transfer assist level: Moderate Assistance - Patient 50 - 74%     Locomotion Ambulation   Ambulation assist      Assist level: Minimal Assistance - Patient > 75% Assistive device: Walker-platform Max distance: 104'   Walk 10 feet activity   Assist     Assist level: Minimal Assistance - Patient > 75% Assistive device: Walker-platform   Walk 50 feet activity   Assist Walk 50 feet with 2 turns activity did not occur: Safety/medical concerns  Assist level: Minimal Assistance - Patient > 75% Assistive device: Walker-platform    Walk 150 feet activity   Assist Walk 150 feet activity did not occur: Safety/medical concerns         Walk 10 feet on uneven surface  activity   Assist Walk 10 feet on uneven surfaces  activity did not occur: Safety/medical concerns         Wheelchair     Assist Will patient use wheelchair at discharge?: Yes Type of Wheelchair: Manual    Wheelchair assist level: Supervision/Verbal cueing Max wheelchair distance: 28'    Wheelchair 50 feet with 2 turns activity    Assist        Assist Level: Dependent - Patient 0%   Wheelchair 150 feet activity     Assist      Assist  Level: Dependent - Patient 0%   Blood pressure 99/62, pulse 100, temperature (!) 97.3 F (36.3 C), resp. rate 16, height 5\' 9"  (1.753 m), weight 54 kg, SpO2 100 %.  Medical Problem List and Plan: 1.  Quadriplegia secondary to C5 incomplete ASIA C initially, now D spinal cord injury.S/P C3-4 4-55-6 and C6-7 anterior cervical decompression and fusion 01/04/2020.  Cervical brace as directed  Continue CIR  2.  Antithrombotics/pulmonary emboli per CT angiogram of chest 12/20/2019: Lower extremity Doppler negative.   -DVT/anticoagulation: Intravenous heparin transitioned to Eliquis 01/11/2020             -antiplatelet therapy: N/A 3. Pain Management: Oxycodone and Robaxin as needed  Controlled with meds on 3/28 4. Mood: Provide emotional support             -antipsychotic agents: N/A 5. Neuropsych: This patient is capable of making decisions on her own behalf. 6. Skin/Wound Care: Routine skin checks 7. Fluids/Electrolytes/Nutrition: Routine in and outs 8.  Anemia.  EGD completed 12/27/2019 per GI services.  Recommend PPI x2 months.  H. pylori serology positive.  Complete course of amoxicillin and Biaxin   Hemoglobin 11.0 on 3/24  Continue to monitor 9. Multiple dental caries.  Follow-up dental services after multiple extractions 12/30/2019 10. Neurogenic bowel due to SCI.    Continue bowel program 11 Streptococcal bacteremia.  Intravenous ceftriaxone completed. 12.  History of tobacco use.  Counseling 13. Spasticity- improved since surgery- for now, doesn't need spasticity agents-  14. Neurogenic bladder/frequency  Pt was started on Flomax- working well- no caths required in last 24 hours- will monitor.   PVRs with low volumes  Urinary frequency improving, cont Enablex 7.5 on 3/27 15.  Hyponatremia  Sodium 133 on 3/24, labs ordered for tomorrow 16.  Transaminitis  LFTs elevated on 3/24, labs ordered for tomorrow  LOS: 5 days A FACE TO FACE EVALUATION WAS PERFORMED  Anna Campos 4/24 01/16/2020, 7:44 PM

## 2020-01-16 NOTE — Plan of Care (Signed)
  Problem: Consults Goal: RH SPINAL CORD INJURY PATIENT EDUCATION Description:  See Patient Education module for education specifics.  Outcome: Progressing   Problem: SCI BOWEL ELIMINATION Goal: RH STG MANAGE BOWEL WITH ASSISTANCE Description: STG Manage Bowel with mod I Assistance. Outcome: Progressing   Problem: SCI BLADDER ELIMINATION Goal: RH STG MANAGE BLADDER WITH ASSISTANCE Description: STG Manage Bladder With Mod I Assistance Outcome: Progressing   Problem: RH SKIN INTEGRITY Goal: RH STG MAINTAIN SKIN INTEGRITY WITH ASSISTANCE Description: STG Maintain Skin Integrity With mod I Assistance. Outcome: Progressing   Problem: RH SAFETY Goal: RH STG ADHERE TO SAFETY PRECAUTIONS W/ASSISTANCE/DEVICE Description: STG Adhere to Safety Precautions With cues and reminders  Outcome: Progressing Goal: RH STG DECREASED RISK OF FALL WITH ASSISTANCE Description: STG Decreased Risk of Fall With mod I Assistance. Outcome: Progressing   Problem: RH PAIN MANAGEMENT Goal: RH STG PAIN MANAGED AT OR BELOW PT'S PAIN GOAL Description: Pain level less than 4 on scale of 0-10 Outcome: Progressing   Problem: RH KNOWLEDGE DEFICIT SCI Goal: RH STG INCREASE KNOWLEDGE OF SELF CARE AFTER SCI Description: Pt will be able to manage bowel program with min assist from family. Pt will be able to adhere to medication regimen with mod I assist from family.  Outcome: Progressing   

## 2020-01-16 NOTE — Progress Notes (Signed)
Per day shift, night shift to finish bowel program and document progress note. Second dig stim was already performed by day shift after pt had bowel movement. Pt refused an additional dig stim, stating that she no longer felt constipated.

## 2020-01-16 NOTE — Progress Notes (Signed)
Physical Therapy Session Note  Patient Details  Name: Anna Campos MRN: [id number] Date of Birth: Nov 09, 1965  Today's Date: 01/16/2020 PT Individual Time: 1435-1535 PT Individual Time Calculation (min): 60 min   Short Term Goals: Week 1:  PT Short Term Goal 1 (Week 1): Pt will complete least restrictive transfers with assist x 1 PT Short Term Goal 2 (Week 1): Pt will ambulate x 50 ft with LRAD PT Short Term Goal 3 (Week 1): Pt will maintain static standing balance with min A  Skilled Therapeutic Interventions/Progress Updates:     Patient in recliner with her brother in the room upon PT arrival. Patient alert and agreeable to PT session. Patient denied pain during session. Patient's brother left at beginning of session.   Therapeutic Activity: Transfers: Patient performed sit to/from stand x1 using the PFRW with min A and stand pivot w/c>BSC>recliner with min-mod A, increased assist during turns and due to increased LE fatigue. Provided verbal cues for scooting forward, foot placement, forward weight shift, and controlling descent. Patient was continent of bowl on the [facility] during session. Required total A peri-care and LB clothing management of doffing incontinence brief, pants, socks, and shoes and donning a new incontinence brief, pants, and non-skid socks. Required min-mod A for standing balance during LB dressing due to LE fatigue.   Gait Training:  Patient ambulated 104 feet using PFRW and R knee brace with min A and w/c follow due to decreased activity tolerance. Ambulated with variable foot placement with intermittent scissoring gait, decreased R weight shift with decreased L step length, decreased R knee control, and decreased R foot clearance. Provided verbal cues for looking ahead to maintain cervical precautions and manage neck pain, increased BOS, increased R foot clearance, and manual facilitation for L weight shift.  Neuromuscular Re-ed: Patient performed the following LE motor  control and balance activities: -propelled w/c with B LEs with forward trunk flexion for increased hamstring activation x35 ft, limited by fatigue -sit to stands without UE support or AD in front of a mirror with feet parallel and hip distance apart focused on LE motor control for controlled movement x5 with min A progressing to CGA -standing balance without UE support in front of a mirror focused on hip extensor and lateral hip activation 4x30-45 sec progressing from min A-CGA each trial  Patient in recliner with her husband in the room at end of session with breaks locked, chair alarm set, and all needs within reach.    Therapy Documentation Precautions:  Precautions Precautions: Fall, Cervical Precaution Comments: reviewed cervical precautions; orthostatic Required Braces or Orthoses: Cervical Brace Cervical Brace: Soft collar(collar can be removed for showering per Dr. Berline Chough) Restrictions Weight Bearing Restrictions: No Other Position/Activity Restrictions: collar at all times per chart    Therapy/Group: Individual Therapy  Domino Holten L Feliza Diven PT, DPT  01/16/2020, 4:08 PM

## 2020-01-17 ENCOUNTER — Inpatient Hospital Stay (HOSPITAL_COMMUNITY): Payer: Commercial Managed Care - PPO

## 2020-01-17 ENCOUNTER — Inpatient Hospital Stay (HOSPITAL_COMMUNITY): Payer: Commercial Managed Care - PPO | Admitting: Physical Therapy

## 2020-01-17 LAB — COMPREHENSIVE METABOLIC PANEL
ALT: 28 U/L (ref 0–44)
AST: 19 U/L (ref 15–41)
Albumin: 3.3 g/dL — ABNORMAL LOW (ref 3.5–5.0)
Alkaline Phosphatase: 200 U/L — ABNORMAL HIGH (ref 38–126)
Anion gap: 11 (ref 5–15)
BUN: 17 mg/dL (ref 6–20)
CO2: 26 mmol/L (ref 22–32)
Calcium: 9.8 mg/dL (ref 8.9–10.3)
Chloride: 102 mmol/L (ref 98–111)
Creatinine, Ser: 0.82 mg/dL (ref 0.44–1.00)
GFR calc Af Amer: >60 mL/min (ref >60)
GFR calc non Af Amer: >60 mL/min (ref >60)
Glucose, Bld: 93 mg/dL (ref 70–99)
Potassium: 4.3 mmol/L (ref 3.5–5.1)
Sodium: 139 mmol/L (ref 135–145)
Total Bilirubin: 0.6 mg/dL (ref 0.3–1.2)
Total Protein: 6.8 g/dL (ref 6.5–8.1)

## 2020-01-17 NOTE — Progress Notes (Signed)
Pt c/o pain to neck 10 out 10. Pt reports that was in  therapy she was asked to scoot to edge, lean forward and stand.  Informed her to try to scoot to edge of bed and widen her feet if needed and then stand instead of leaning forward. Husband concerned that staff should be aware of her condition. Informed that what was asked is the correct way to stand from sitting position, however it can be adjusted due to injury.  Informed pt to try again without leaning forward, it may require more strength but may be a better way at this time.

## 2020-01-17 NOTE — Progress Notes (Signed)
Occupational Therapy Session Note  Patient Details  Name: Anna Campos MRN: [id number] Date of Birth: 1966-10-11  Today's Date: 01/17/2020 OT Individual Time: 0900-1015 OT Individual Time Calculation (min): 75 min    Short Term Goals: Week 1:  OT Short Term Goal 1 (Week 1): Pt will self feed using universal cuff or foam grips and the RUE for 75% of meal with no more than min assist. OT Short Term Goal 2 (Week 1): Pt will complete UB bathing with min assist for 2 consecutive sessions with use of a washmit PRN. OT Short Term Goal 3 (Week 1): Pt will perform toilet transfer stand pivot with mod assist to the 3:1. OT Short Term Goal 4 (Week 1): Pt will complete UB dressing with min assist for pullover shirt only. OT Short Term Goal 5 (Week 1): Pt/family will return demonstrate safe completion of AAROM exercises for BUE following handout.  Skilled Therapeutic Interventions/Progress Updates:  Pt received seated in recliner agreeable to OT intervention. Pt requesting to shower this session reporting no pain. Pt ambulated from recliner to shower seat with MOD A +2 and PFRW. Pt required cues for RW mgmt and assist to initially steady self as pt tends to use much momentum during sit<>stand nearing a posterior LOB. Pt able to doff socks in shower while OTA assisted pt with maintaining figure four position, total A to doff shirt, MOD A to doff pants in standing. Pt completed shower with MAX A for bathing needing hand over hand assist to wash UB/LB with bathmit. Pt required verbal cues to maintain cervical precautions during shower. Doffed collar in shower briefly and provided max cues to sustain neutral position. Pt ambulated from shower to w/c in front of seat with MOD A with bil PFRW. Pt required MAX A for UB/LB dressing needing cues to use BUE as stabilizer and to incorporate compensatory methods during dressing. Pt completed seated oral care with set- up assist, noted pt able to manipulate toothbrush better  without u- cuff. Pt able to ambulate back to recliner from sink with MOD A to manage PFRW. Pt left seated in recliner with chair alarm activated and all needs within reach.   Therapy Documentation Precautions:  Precautions Precautions: Fall, Cervical Precaution Comments: reviewed cervical precautions; orthostatic Required Braces or Orthoses: Cervical Brace Cervical Brace: Soft collar(collar can be removed for showering per Dr. Berline Chough) Restrictions Weight Bearing Restrictions: No Other Position/Activity Restrictions: collar at all times per chart General:   Vital Signs:   Pain: Pt reports no pain during session.   Therapy/Group: Individual Therapy  Angelina Pih 01/17/2020, 11:37 AM

## 2020-01-17 NOTE — Progress Notes (Signed)
The chaplain responded to a request from Social Work. The patient was asleep. The chaplain left the Advanced Directive paperwork with the patient's nurse to give to her later. The chaplain is available to follow-up when the paperwork is complete.  Lavone Neri Chaplain Resident For questions concerning this note please contact me by pager [phone]

## 2020-01-17 NOTE — Progress Notes (Signed)
Social Work Assessment and Plan   Patient Details  Name: Anna Campos MRN: [id number] Date of Birth: Jan 20, 1966  Today's Date: 01/17/2020  Problem List:  Patient Active Problem List   Diagnosis Date Noted  . Pressure injury of skin 01/15/2020  . Transaminitis   . Hyponatremia   . Anemia   . Neurogenic bowel 01/11/2020  . Neurogenic bladder 01/11/2020  . Malnutrition of moderate degree 01/01/2020  . Poor dentition   . Spinal stenosis of cervical region   . Incomplete quadriplegia at C5-6 level ([place])   . Hypotension   . Bacteremia   . Syncope 12/19/2019   Past Medical History:  Past Medical History:  Diagnosis Date  . PE (pulmonary thromboembolism) ([place]) 12/20/2019  . Syncope 12/20/2019   Past Surgical History:  Past Surgical History:  Procedure Laterality Date  . ANTERIOR CERVICAL DECOMPRESSION/DISCECTOMY FUSION 4 LEVELS N/A 01/04/2020   Procedure: CERVICAL THREE- FOUR, CERVICAL FOUR-FIVE, CERVICAL FIVE- SIX, CERVCAL SIX- SEVEN ANTERIOR CERVICAL DECOMPRESSION/DISCECTOMY FUSION;  Surgeon: Earnie Larsson, MD;  Location: [place];  Service: Neurosurgery;  Laterality: N/A;  CERVICAL THREE- FOUR, CERVICAL FOUR-FIVE, CERVICAL FIVE- SIX, CERVCAL SIX- SEVEN ANTERIOR CERVICAL DECOMPRESSION/DISCECTOMY FUSION  . CESAREAN SECTION    . ESOPHAGOGASTRODUODENOSCOPY (EGD) WITH PROPOFOL N/A 12/27/2019   Procedure: ESOPHAGOGASTRODUODENOSCOPY (EGD) WITH PROPOFOL;  Surgeon: Ronnette Juniper, MD;  Location: [place];  Service: Gastroenterology;  Laterality: N/A;  PLEASE ALERT ANESTHESIA TO THE FACT THAT THE PATIENT HAS SUSTAINED A NECK INJURY AND HAS A QUADRIPARESIS  . HEMOSTASIS CLIP PLACEMENT  12/27/2019   Procedure: HEMOSTASIS CLIP PLACEMENT;  Surgeon: Ronnette Juniper, MD;  Location: [facility] ENDOSCOPY;  Service: Gastroenterology;;  . MULTIPLE EXTRACTIONS WITH ALVEOLOPLASTY N/A 12/30/2019   Procedure: Extraction of tooth #'s 2-8, 11-14, and 20-28 with alveoloplasty and bilateral mandibular lingual tori reductions.;   Surgeon: Lenn Cal, DDS;  Location: [place];  Service: Oral Surgery;  Laterality: N/A;   Social History:  reports that she has quit smoking. She has never used smokeless tobacco. She reports current alcohol use. She reports previous drug use.  Family / Support Systems Marital Status: Married How Long?: 22 years Patient Roles: Spouse, Parent Spouse/Significant Other: Lanny Hurst (husband): [phone] Children: 1- 17y.o dtr Alese who is in 11th grade Other Supports: N/A Anticipated Caregiver: Husband Ability/Limitations of Caregiver: None reported Caregiver Availability: 24/7 Family Dynamics: Works, and lives with husband and 72 y.o. 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 Currently has 200hrs of vacation time. Legal History/Current Legal Issues: Denies Guardian/Conservator: N/A   Abuse/Neglect Abuse/Neglect Assessment Can Be Completed: Yes Physical Abuse: Denies Verbal Abuse: Denies Sexual Abuse: Denies Exploitation of patient/patient's resources: Denies Self-Neglect: Denies  Emotional Status Pt's affect, behavior and adjustment status: Pt appears to be adjusting to her condition Recent Psychosocial Issues: Denies Psychiatric History: Denies Substance Abuse History: Langley Gauss; pt states she quit smoking 1 year ago; rarely drinks EtOH. Denies recreational drug use  Patient / Family Perceptions, Expectations & Goals Pt/Family understanding of illness & functional limitations: Pt and husband have a general understandinfg of care needs Premorbid pt/family roles/activities: Independent Anticipated changes in roles/activities/participation:  Assistance with IADLs/ADLs Pt/family expectations/goals: Pt goal is to continue to work on core and arm strength  Community Resources Express Scripts: None Premorbid Home Care/DME Agencies: None Transportation available at discharge: Husband to d/c to home  Discharge Planning Living Arrangements: Spouse/significant other, Children Support Systems: Spouse/significant other, Children Type of Residence: Private residence Insurance Resources: Multimedia programmer (specify)(UHC) Museum/gallery curator Resources: Employment Museum/gallery curator Screen Referred: No Living Expenses: Medical laboratory scientific officer Management: Patient, Spouse Does the patient have any problems obtaining your medications?: No Social Work Anticipated Follow Up Needs: HH/OP Expected length of stay: 21-25 days  Clinical Impression SW met with pt and pt husband in room to introduce self, explain role, and discuss discharge process. Pt is not a English as a second language teacher. SW informed received FMLA forms. Pt and husband would like to complete advanced care directive.  Rana Snare 01/17/2020, 4:46 PM

## 2020-01-17 NOTE — Progress Notes (Signed)
Physical Therapy Session Note  Patient Details  Name: Anna Campos MRN: [id number] Date of Birth: October 30, 1965  Today's Date: 01/17/2020 PT Individual Time: 1100-1150 PT Individual Time Calculation (min): 50 min  PT Missed Time: 10 min Missed Time Reason: pain  Short Term Goals: Week 1:  PT Short Term Goal 1 (Week 1): Pt will complete least restrictive transfers with assist x 1 PT Short Term Goal 2 (Week 1): Pt will ambulate x 50 ft with LRAD PT Short Term Goal 3 (Week 1): Pt will maintain static standing balance with min A  Skilled Therapeutic Interventions/Progress Updates:    Pt received seated in recliner in room, agreeable to PT session. No complaints of pain at rest. Pt is mod A to stand to PFRW with max cueing for safety, exhibits some impulsivity. Ambulation to w/c x 10 ft with PFRW and min A. Session focus on safe sit to stand transfer with max cueing for body positioning and weight shift during transfer with mod A progressing to min A. Sit to stand 3 x 5 reps from progressively lower mat table without use of PFRW, min to mod A for standing balance. Pt exhibits improved technique with increase in repetitions. Ambulation 2 x 100 ft with PFRW with min A for balance and w/c follow for safety from a 2nd person. Pt continues to exhibit ataxic gait pattern with BLE scissoring and narrow BOS, improved since last week. Pt reports feeling that her R knee is "stiff", v/c for R heel strike during gait. Pt tends to exhibit increased step length with RLE as compared to LLE due to knee stiffness. Pt has onset of 7/10 pain in L upper trap during session increasing to 8/10 pain, reports as "sharp and shooting" pain. Provided hot pack with some relief noted. Pt requesting to return to room and lay down due to pain. Stand pivot transfer w/c to bed with mod A. Sit to supine assist x 2 due to pain for trunk control and BLE management. Pt left semi-reclined in bed with needs in reach, bed alarm in place, hot pack  to L upper trap. Pt missed 10 min of scheduled therapy session due to pain.  Therapy Documentation Precautions:  Precautions Precautions: Fall, Cervical Precaution Comments: reviewed cervical precautions; orthostatic Required Braces or Orthoses: Cervical Brace Cervical Brace: Soft collar(collar can be removed for showering per Dr. Berline Chough) Restrictions Weight Bearing Restrictions: No Other Position/Activity Restrictions: collar at all times per chart    Therapy/Group: Individual Therapy   Peter Congo, PT, DPT  01/17/2020, 11:56 AM

## 2020-01-17 NOTE — Progress Notes (Signed)
[place] PHYSICAL MEDICINE & REHABILITATION PROGRESS NOTE   Subjective/Complaints:  Pt reports doing "ok"- if eats a meal, it goes right through her- wants to back off bowel program to every other day but explained might not reduce bowel movements. Says knows when needs to go B/B- voids after breakfast,  Thinks is going to have to file for disability- I agree.    ROS:  Pt denies SOB, abd pain, CP, N/V/C/D, and vision changes   Objective:   No results found. No results for input(s): WBC, HGB, HCT, PLT in the last 72 hours. Recent Labs    01/17/20 0630  NA 139  K 4.3  CL 102  CO2 26  GLUCOSE 93  BUN 17  CREATININE 0.82  CALCIUM 9.8    Intake/Output Summary (Last 24 hours) at 01/17/2020 1935 Last data filed at 01/17/2020 1852 Gross per 24 hour  Intake 840 ml  Output --  Net 840 ml     Physical Exam: Vital Signs Blood pressure 95/70, pulse 95, temperature 98.5 F (36.9 C), resp. rate 18, height 5\' 9"  (1.753 m), weight 54.5 kg, SpO2 100 %.  Gen: awake, alert, appropriate, in bedside chair, NAD HENT: wearing soft collar- incision looks good CV: borderline tachycardia- regular rhythm Pulm: CTA B/L- no W/R/R- good air movement GI: Soft, NT, ND, (+)BS  Skin: Warm and dry.  Intact. Psych: appropriate Musc: No edema in extremities.  No tenderness in extremities. Neuro: Ox3- no clonus, hoffman's or increased tone  Motor: RUE: Shoulder abduction 2/5, elbow flexion/extension 3/5, handgrip 2/5, unchanged LUE: Shoulder abduction 2+/5 proximal distal, elbow flexion/extension 3/5, and grip 3 -/5 Left lower extremity: 4/5 proximal distal Right lower extremity: Hip flexion, knee extension 3 +/5, ankle dorsiflexion 2/5  Assessment/Plan: 1. Functional deficits secondary to C5 ASIA D due to osteomyelitis which require 3+ hours per day of interdisciplinary therapy in a comprehensive inpatient rehab setting.  Physiatrist is providing close team supervision and 24 hour management  of active medical problems listed below.  Physiatrist and rehab team continue to assess barriers to discharge/monitor patient progress toward functional and medical goals  Care Tool:  Bathing    Body parts bathed by patient: Chest, Abdomen, Right upper leg, Left upper leg, Face, Front perineal area   Body parts bathed by helper: Right arm, Left arm, Right lower leg, Left lower leg     Bathing assist Assist Level: Maximal Assistance - Patient 24 - 49%     Upper Body Dressing/Undressing Upper body dressing   What is the patient wearing?: Pull over shirt    Upper body assist Assist Level: Maximal Assistance - Patient 25 - 49%    Lower Body Dressing/Undressing Lower body dressing      What is the patient wearing?: Incontinence brief, Pants     Lower body assist Assist for lower body dressing: Maximal Assistance - Patient 25 - 49%     Toileting Toileting    Toileting assist Assist for toileting: 2 Helpers     Transfers Chair/bed transfer  Transfers assist     Chair/bed transfer assist level: Moderate Assistance - Patient 50 - 74%     Locomotion Ambulation   Ambulation assist      Assist level: 2 helpers Assistive device: Walker-platform Max distance: 100'   Walk 10 feet activity   Assist     Assist level: 2 helpers Assistive device: Walker-platform   Walk 50 feet activity   Assist Walk 50 feet with 2 turns activity did not  occur: Safety/medical concerns  Assist level: 2 helpers Assistive device: Walker-platform    Walk 150 feet activity   Assist Walk 150 feet activity did not occur: Safety/medical concerns         Walk 10 feet on uneven surface  activity   Assist Walk 10 feet on uneven surfaces activity did not occur: Safety/medical concerns         Wheelchair     Assist Will patient use wheelchair at discharge?: Yes Type of Wheelchair: Manual    Wheelchair assist level: Supervision/Verbal cueing Max wheelchair  distance: 19'    Wheelchair 50 feet with 2 turns activity    Assist        Assist Level: Dependent - Patient 0%   Wheelchair 150 feet activity     Assist      Assist Level: Dependent - Patient 0%   Blood pressure 95/70, pulse 95, temperature 98.5 F (36.9 C), resp. rate 18, height 5\' 9"  (1.753 m), weight 54.5 kg, SpO2 100 %.  Medical Problem List and Plan: 1.  Quadriplegia secondary to C5 incomplete ASIA C initially, now D spinal cord injury.S/P C3-4 4-55-6 and C6-7 anterior cervical decompression and fusion 01/04/2020.  Cervical brace as directed  Continue CIR  2.  Antithrombotics/pulmonary emboli per CT angiogram of chest 12/20/2019: Lower extremity Doppler negative.   -DVT/anticoagulation: Intravenous heparin transitioned to Eliquis 01/11/2020             -antiplatelet therapy: N/A 3. Pain Management: Oxycodone and Robaxin as needed  3/29- pain is controlled- con't meds prn 4. Mood: Provide emotional support             -antipsychotic agents: N/A 5. Neuropsych: This patient is capable of making decisions on her own behalf. 6. Skin/Wound Care: Routine skin checks 7. Fluids/Electrolytes/Nutrition: Routine in and outs 8.  Anemia.  EGD completed 12/27/2019 per GI services.  Recommend PPI x2 months.  H. pylori serology positive.  Complete course of amoxicillin and Biaxin   Hemoglobin 11.0 on 3/24  Continue to monitor 9. Multiple dental caries.  Follow-up dental services after multiple extractions 12/30/2019 10. Neurogenic bowel due to SCI.    Continue bowel program  3/29 - will change to QOD per pt's wishes/bowel habits 11 Streptococcal bacteremia.  Intravenous ceftriaxone completed. 12.  History of tobacco use.  Counseling 13. Spasticity- improved since surgery- for now, doesn't need spasticity agents-  14. Neurogenic bladder/frequency  Pt was started on Flomax- working well- no caths required in last 24 hours- will monitor.   PVRs with low volumes  Urinary frequency  improving, cont Enablex 7.5 on 3/27  3/29- voiding well- no retention.  15.  Hyponatremia  Sodium 133 on 3/24, labs ordered for tomorrow 16.  Transaminitis  LFTs elevated on 3/24, labs ordered for tomorrow  3/29- LFTs increase has resolved  LOS: 6 days A FACE TO FACE EVALUATION WAS PERFORMED  Anna Campos 01/17/2020, 7:35 PM

## 2020-01-17 NOTE — Progress Notes (Signed)
Occupational Therapy Session Note  Patient Details  Name: Anna Campos MRN: [id number] Date of Birth: 1965-11-21  Today's Date: 01/17/2020 OT Individual Time: 1400-1435 OT Individual Time Calculation (min): 35 min  and Today's Date: 01/17/2020 OT Missed Time: 30 Minutes Missed Time Reason: Pain   Short Term Goals: Week 1:  OT Short Term Goal 1 (Week 1): Pt will self feed using universal cuff or foam grips and the RUE for 75% of meal with no more than min assist. OT Short Term Goal 2 (Week 1): Pt will complete UB bathing with min assist for 2 consecutive sessions with use of a washmit PRN. OT Short Term Goal 3 (Week 1): Pt will perform toilet transfer stand pivot with mod assist to the 3:1. OT Short Term Goal 4 (Week 1): Pt will complete UB dressing with min assist for pullover shirt only. OT Short Term Goal 5 (Week 1): Pt/family will return demonstrate safe completion of AAROM exercises for BUE following handout.  Skilled Therapeutic Interventions/Progress Updates:    Pt missed 30 mins skilled OT services 2/2 10/10 pain (see below). Upon return pt agreeable to therapy. OT interventin with focus on sit<>stand, functional amb with PFRW, and BUE strengthening.  Pt able to grasp boom whacker after wrapping coband and hit beach ball to therapist (10x5). Pt practiced sit<>stand with focus on increased trunk flexion during when pushing up from chair.  Pt required mod tactile cues on upper back to maintain flexion when powering up.  Pt required min A for pushing up from sitting positon.  Pt performed sit<>stand X 5.  Pt returned to room and transferred back to bed.  Sit>supine with supervision.  Pt remained in bed with all needs within reach, husband present, and bed alarm activated.   Therapy Documentation Precautions:  Precautions Precautions: Fall, Cervical Precaution Comments: reviewed cervical precautions; orthostatic Required Braces or Orthoses: Cervical Brace Cervical Brace: Soft  collar(collar can be removed for showering per Dr. Berline Chough) Restrictions Weight Bearing Restrictions: No Other Position/Activity Restrictions: collar at all times per chart General: General OT Amount of Missed Time: 30 Minutes PT Missed Treatment Reason: Pain Vital Signs:   Pain: Pt c/o 10/10 neck pain on initial encounter. Pt states she just received pain meds.  Upon return in 30 mins pt reports 0/10 pain and agreeable to engaging in therapy.  Therapy/Group: Individual Therapy  [name] 01/17/2020, 2:39 PM

## 2020-01-17 NOTE — Progress Notes (Signed)
Social Work Patient ID: Anna Campos, female   DOB: 18-Jul-1966, 54 y.o.   MRN: [id number]    SW faxed FMLA forms for pt husband Lanny Hurst to [employer].  SW provided pt wife with original of FMLA forms completed for both. SW informed pt her forms did not have a fax number so unable to send. Pt provided information for SSDI; SSA kit provided.   Loralee Pacas, MSW, [place] Office: [phone] Cell: [phone] Fax: [phone]

## 2020-01-18 ENCOUNTER — Inpatient Hospital Stay (HOSPITAL_COMMUNITY): Payer: Commercial Managed Care - PPO | Admitting: Physical Therapy

## 2020-01-18 ENCOUNTER — Inpatient Hospital Stay (HOSPITAL_COMMUNITY): Payer: Commercial Managed Care - PPO | Admitting: *Deleted

## 2020-01-18 ENCOUNTER — Inpatient Hospital Stay (HOSPITAL_COMMUNITY): Payer: Commercial Managed Care - PPO

## 2020-01-18 MED ORDER — ENSURE ENLIVE PO LIQD
237.0000 mL | Freq: Three times a day (TID) | ORAL | Status: DC
  Administered 2020-01-18 – 2020-02-04 (×45): 237 mL via ORAL

## 2020-01-18 NOTE — Progress Notes (Signed)
Physical Therapy Session Note  Patient Details  Name: Anna Campos MRN: [id number] Date of Birth: 13-Sep-1966  Today's Date: 01/18/2020 PT Individual Time: 1300-1415 PT Individual Time Calculation (min): 75 min   Short Term Goals: Week 1:  PT Short Term Goal 1 (Week 1): Pt will complete least restrictive transfers with assist x 1 PT Short Term Goal 2 (Week 1): Pt will ambulate x 50 ft with LRAD PT Short Term Goal 3 (Week 1): Pt will maintain static standing balance with min A  Skilled Therapeutic Interventions/Progress Updates:    Pt received seated in recliner in room, agreeable to PT session. No complaints of pain. Sit to stand with min to mod A throughout session to PFRW, v/c for safe transfer technique. Ambulation 2 x 50 ft with PFRW and min A with use of 5# ankle weights for improved proprioception and input to BLE. Pt exhibits improved control and decreased ataxia with use of 5# weights, does have onset of R toe drag with onset of fatigue. Nustep level 5 x 10 min with use of BLE for global strengthening, pt reports 13/20 on Borg scale. Pt reports ongoing tightness in RLE. Sit to supine CGA. Supine RLE hip flexor and HS stretch 3 x 60 sec each. Pt reports improvement in tightness following stretches. Supine BLE strengthening therex: hip flex, heel slides, hip abd x 10-15 reps each. Pt requests to return to recliner at end of session. Supine to sit min A with use of bedrail and HOB slightly elevated. Stand pivot transfer bed to recliner with PFRW and min A. Pt left seated in recliner in room with needs in reach, chair alarm in place at end of session.  Therapy Documentation Precautions:  Precautions Precautions: Fall, Cervical Precaution Comments: reviewed cervical precautions; orthostatic Required Braces or Orthoses: Cervical Brace Cervical Brace: Soft collar(collar can be removed for showering per Dr. Berline Chough) Restrictions Weight Bearing Restrictions: No Other Position/Activity  Restrictions: collar at all times per chart    Therapy/Group: Individual Therapy   Peter Congo, PT, DPT  01/18/2020, 3:45 PM

## 2020-01-18 NOTE — Progress Notes (Signed)
Nutrition Follow-up  **RD working remotely**  DOCUMENTATION CODES:   Non-severe (moderate) malnutrition in context of social or environmental circumstances, Underweight  INTERVENTION:  -Ensure Enlive po TID, each supplement provides 350 kcal and 20 grams of protein -Continue MVI with minerals daily  NUTRITION DIAGNOSIS:   Moderate Malnutrition related to social / environmental circumstances as evidenced by mild fat depletion, moderate fat depletion, mild muscle depletion, moderate muscle depletion.  Ongoing.  GOAL:   Patient will meet greater than or equal to 90% of their needs  Progressing.  MONITOR:   PO intake, Supplement acceptance, Labs, Weight trends, Skin, I & O's  REASON FOR ASSESSMENT:   Malnutrition Screening Tool    ASSESSMENT:   Anna Campos is a 54 year old right-handed female with unremarkable past medical history except tobacco abuse on no prescription medications. 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  Neurosurgery Dr. Annette Stable consulted and plan would be for surgical intervention with decompression and stabilization after antibiotic therapy completed.  Hospital course complicated by hemoglobin 6.8 from admission hemoglobin 10.7 patient did receive 2 units packed red blood cells 12/25/2019.  Patient occult heme positive.  Gastroenterology services Dr. Cristina Gong consulted underwent EGD 12/27/2019 showing normal-appearing esophagus.  Mild erythematous mucosa in the entire gastric cavity.  2 superficial prepyloric ulcers 1 with clean base and the other with pigmentation.  1 Endo Clip was deployed to close the mucosal defect.  Normal-appearing cardia and fundus.  H. pylori serology positive placed on triple therapy clindamycin amoxicillin PPI 12/30/2019 patient was cleared to resume intravenous heparin and PPI was added twice  daily for 2 months.  Her latest hemoglobin was 9.4 on 01/07/2020.  Patient also with multiple dental caries underwent multiple extractions 12/30/2019.  Patient was later cleared for surgical intervention and underwent C3-4, 4-5, 5-6, C6-7 anterior cervical decompression and fusion utilizing interbody cages 01/04/2020 per Dr. Annette Stable.  Cervical brace as directed.  Maintained on a regular diet.  She currently remains on intravenous heparin for pulmonary emboli and transitioned to Eliquis 01/11/2020.  Therapy evaluations completed and patient was admitted for a comprehensive rehab program.  Pt admitted with quadriplegia secondary to C5 incomplete ASIA C initially, now Dspinal cord injury.S/P C3-4 4-55-6 and C6-7 anterior cervical decompression and fusion 01/04/2020.  RD unable to reach pt via phone.   PO Intake: 15-100% x last 8 recorded meals (66% average intake)  Pt receiving Ensure Enlive BID. Per RN, pt is drinking 100% of supplements.  Admit weight: 53.8 kg Current weight: 54 kg  Labs reviewed. Medications reviewed and include: Dulcolax, MVI, Senokot  Diet Order:   Diet Order            Diet regular Room service appropriate? Yes; Fluid consistency: Thin  Diet effective now              EDUCATION NEEDS:   Education needs have been addressed  Skin:  Skin Assessment: Skin Integrity Issues: Skin Integrity Issues:: Stage II, Incisions Stage II: sacrum, buttocks Incisions: neck  Last BM:  3/29  Height:   Ht Readings from Last 1 Encounters:  01/11/20 5\' 9"  (1.753 m)    Weight:   Wt Readings from Last 1 Encounters:  01/18/20 54 kg    BMI:  Body mass index is 17.58 kg/m.  Estimated Nutritional Needs:   Kcal:  1700-1900  Protein:  90-105 grams  Fluid:  > 1.7  L   Anna Gavia, MS, RD, LDN RD pager number and weekend/on-call pager number located in [place].

## 2020-01-18 NOTE — Patient Care Conference (Signed)
Inpatient RehabilitationTeam Conference and Plan of Care Update Date: 01/18/2020   Time: 11:05 AM   Patient Name: Anna Campos      Medical Record Number: [id number]  Date of Birth: January 11, 1966 Sex: Female         Room/Bed: 4W24C/4W24C-01 Payor Info: Payor: Advertising copywriter / Plan: [employer] PPO / Product Type: *No Product type* /    Admit Date/Time:  01/11/2020  4:34 PM  Primary Diagnosis:  Incomplete quadriplegia at C5-6 level [facility])  Patient Active Problem List   Diagnosis Date Noted  . Pressure injury of skin 01/15/2020  . Transaminitis   . Hyponatremia   . Anemia   . Neurogenic bowel 01/11/2020  . Neurogenic bladder 01/11/2020  . Malnutrition of moderate degree 01/01/2020  . Poor dentition   . Spinal stenosis of cervical region   . Incomplete quadriplegia at C5-6 level (HCC)   . Hypotension   . Bacteremia   . Syncope 12/19/2019    Expected Discharge Date: Expected Discharge Date: 02/05/20  Team Members Present: Physician leading conference: Dr. Genice Rouge Care Coodinator Present: Cecile Sheerer, LCSWA;Genie Traevon Meiring, RN, MSN Nurse Present: Donna Bernard, LPN PT Present: Peter Congo, PT OT Present: Roney Mans, OT;Ardis Rowan, COTA SLP Present: Suzzette Righter, CF-SLP PPS Coordinator present : Fae Pippin, SLP     Current Status/Progress Goal Weekly Team Focus  Bowel/Bladder   continent of b/b; LBM: 03/29  remain continent of b/b; continue bowel program every other day  assist with toileting need prn   Swallow/Nutrition/ Hydration             ADL's   bathing-max A; dressing-max A; toilet transfers-mod A; toileting-max A; sit<>stand-min A/mod A  min A overall  BADL retraining, functional transfers, safety awareness, education, activity tolerane, BUE NMR   Mobility   mod A bed mobility, min/mod sit to stand and transfers, gait up to 105' with PFRW +2 for safety  min A overall  UE and LE NMR, transfers, safety awareness   Communication              Safety/Cognition/ Behavioral Observations            Pain   no c/o pain to neck; prn tylenol and oxycodone  pain level >4/10  assess pain level QS and prn   Skin   incision to neck  remain free of skin breakdown and infection  assess skin QS and prn    Rehab Goals Patient on target to meet rehab goals: Yes *See Care Plan and progress notes for long and short-term goals.     Barriers to Discharge  Current Status/Progress Possible Resolutions Date Resolved   Nursing                  PT                    OT                  SLP                SW                Discharge Planning/Teaching Needs:  D/c to home with 24/7 care from her husband. Husband intends to take FMLA.  Family education as recommended by therapy   Team Discussion: MD post cervical fusion, PEs, neurogenic B/B, urgency over weekend, doing PVRs, stopped bowel program.  RN A+O, BM 3/29, tolerating meds whole, scrape on buttocks, oxy prn  pain given, cont B/B.  OT LB dressing max, LB bathing max, sit to stand and transfers min/mod, S/min A goals, toileting mod A to +2 assist.  PT min/mod to stand, amb PFW 100', min A with w/c to follow, min/mod A bed, S/min A goals.   Revisions to Treatment Plan: N/A     Medical Summary Current Status: LBM yesterday; abrasions at backside x2; continent B/B Weekly Focus/Goal: min-mod assist to stand; walking 154ft platform walker- min assist'ataxic, poor proprioception;  Barriers to Discharge: Decreased family/caregiver support;Weight bearing restrictions;Neurogenic Bowel & Bladder;Medical stability;Weight;Wound care  Barriers to Discharge Comments: incomplete SCI- quad Possible Resolutions to Barriers: OT-  max assist bathing and dressing; toileting transfers min-mod assist; toileting mod-to +2 toileting   Continued Need for Acute Rehabilitation Level of Care: The patient requires daily medical management by a physician with specialized training in physical medicine and  rehabilitation for the following reasons: Direction of a multidisciplinary physical rehabilitation program to maximize functional independence : Yes Medical management of patient stability for increased activity during participation in an intensive rehabilitation regime.: Yes Analysis of laboratory values and/or radiology reports with any subsequent need for medication adjustment and/or medical intervention. : Yes   I attest that I was present, lead the team conference, and concur with the assessment and plan of the team.   Retta Diones 01/18/2020, 12:55 PM   Team conference was held via web/ teleconference due to [place] - 19

## 2020-01-18 NOTE — Progress Notes (Signed)
Occupational Therapy Session Note  Patient Details  Name: Anna Campos MRN: [id number] Date of Birth: 1965-11-04  Today's Date: 01/18/2020 OT Individual Time: 0300-0330 OT Individual Time Calculation (min): 30 min    Short Term Goals: Week 1:  OT Short Term Goal 1 (Week 1): Pt will self feed using universal cuff or foam grips and the RUE for 75% of meal with no more than min assist. OT Short Term Goal 1 - Progress (Week 1): Met OT Short Term Goal 2 (Week 1): Pt will complete UB bathing with min assist for 2 consecutive sessions with use of a washmit PRN. OT Short Term Goal 2 - Progress (Week 1): Progressing toward goal OT Short Term Goal 3 (Week 1): Pt will perform toilet transfer stand pivot with mod assist to the 3:1. OT Short Term Goal 3 - Progress (Week 1): Met OT Short Term Goal 4 (Week 1): Pt will complete UB dressing with min assist for pullover shirt only. OT Short Term Goal 4 - Progress (Week 1): Progressing toward goal OT Short Term Goal 5 (Week 1): Pt/family will return demonstrate safe completion of AAROM exercises for BUE following handout. OT Short Term Goal 5 - Progress (Week 1): Progressing toward goal  Skilled Therapeutic Interventions/Progress Updates:  Pt received seated in recliner, asleep upon OTA arrival, however easily able to arouse, agreeable to OT intervention. Pt completed functional mobility to w/c with MIN A with PFRW. Pt continues to require MOD A for sit<>stand from recliner needing assist to power into standing and cues to not scoot too far to the edge. Pt transported to day room with total A. Pt completed standing therapeutic activity where pt instructed to reach across midline to retrieve cones and place on opposite side. Pt required MIN A for standing balance and intermittent assist to initially grasp cone. Issued pt compliant cube to facilitate functional gross grasp. Pt able to return demo technique with good carryover. Pt transported back to room in w/c  with total A where pt ambulated ~ 8 ft to recliner with MIN A. Pt left seated in recliner with chair alarm activated and all needs within reach.    Therapy Documentation Precautions:  Precautions Precautions: Fall, Cervical Precaution Comments: reviewed cervical precautions; orthostatic Required Braces or Orthoses: Cervical Brace Cervical Brace: Soft collar(collar can be removed for showering per Dr. Dagoberto Ligas) Restrictions Weight Bearing Restrictions: No Other Position/Activity Restrictions: collar at all times per chart General:   Vital Signs: Therapy Vitals Temp: 98.2 F (36.8 C) Temp Source: Oral Pulse Rate: 92 Resp: 16 BP: 94/68 Patient Position (if appropriate): Sitting Oxygen Therapy SpO2: 100 % O2 Device: Room Air Pain: Pt reports no pain during session.   Therapy/Group: Individual Therapy  Ihor Gully 01/18/2020, 4:07 PM

## 2020-01-18 NOTE — Progress Notes (Signed)
Occupational Therapy Weekly Progress Note  Patient Details  Name: Anna Campos MRN: [id number] Date of Birth: 1966-07-18  Beginning of progress report period: January 12, 2020 End of progress report period: January 18, 2020      Patient has met 2 of 5 short term goals. Pt has made good progress towards OT goals this session. Pt continues to present with decreased  BUE strength and  FMC, impaired  standing balance, and decreased strength and endurance limiting ADL participation. Overall, pt required MAX A for UB/ LB ADLs and MIN- MOD A for functional mobility with PFRW. Pt requires at least set- up assist for grooming tasks at sink and cues to maintain cervical precautions during functional tasks. Pt continues to be motivated towards reaching OT goals and has good family support.   Patient continues to demonstrate the following deficits: muscle weakness, decreased cardiorespiratoy endurance, impaired timing and sequencing, unbalanced muscle activation and decreased coordination and decreased sitting balance, decreased standing balance, decreased postural control, decreased balance strategies and difficulty maintaining precautions and therefore will continue to benefit from skilled OT intervention to enhance overall performance with BADL and Reduce care partner burden.  Patient progressing toward long term goals..  Continue plan of care.  OT Short Term Goals Week 1:  OT Short Term Goal 1 (Week 1): Pt will self feed using universal cuff or foam grips and the RUE for 75% of meal with no more than min assist. OT Short Term Goal 1 - Progress (Week 1): Met OT Short Term Goal 2 (Week 1): Pt will complete UB bathing with min assist for 2 consecutive sessions with use of a washmit PRN. OT Short Term Goal 2 - Progress (Week 1): Progressing toward goal OT Short Term Goal 3 (Week 1): Pt will perform toilet transfer stand pivot with mod assist to the 3:1. OT Short Term Goal 3 - Progress (Week 1): Met OT Short  Term Goal 4 (Week 1): Pt will complete UB dressing with min assist for pullover shirt only. OT Short Term Goal 4 - Progress (Week 1): Progressing toward goal OT Short Term Goal 5 (Week 1): Pt/family will return demonstrate safe completion of AAROM exercises for BUE following handout. OT Short Term Goal 5 - Progress (Week 1): Progressing toward goal Week 2:  OT Short Term Goal 1 (Week 2): Pt will complete UB bathing with min assist for 2 consecutive sessions with use of a washmit PRN. OT Short Term Goal 2 (Week 2): Pt will complete UB dressing with min assist for pullover shirt only. OT Short Term Goal 3 (Week 2): Pt will complete UB dressing with min assist for pullover shirt only. OT Short Term Goal 4 (Week 2): Pt/family will return demonstrate safe completion of AAROM exercises for BUE following handout. OT Short Term Goal 5 (Week 2): Pt/family will return demonstrate safe completion of AAROM exercises for BUE following handout.  Skilled Therapeutic Interventions/Progress Updates:    continue with POC  Therapy Documentation Precautions:  Precautions Precautions: Fall, Cervical Precaution Comments: reviewed cervical precautions; orthostatic Required Braces or Orthoses: Cervical Brace Cervical Brace: Soft collar(collar can be removed for showering per Dr. Dagoberto Ligas) Restrictions Weight Bearing Restrictions: No Other Position/Activity Restrictions: collar at all times per chart General:   Vital Signs:   Pain: Pain Assessment Pain Scale: 0-10 Pain Score: 0-No pain ADL: ADL Equipment Provided: Feeding equipment Eating: Maximal assistance Where Assessed-Eating: Chair Grooming: Maximal assistance Where Assessed-Grooming: Wheelchair Upper Body Bathing: Maximal assistance Where Assessed-Upper Body Bathing: Wheelchair Lower Body  Bathing: Dependent Where Assessed-Lower Body Bathing: Sitting at sink, Standing at sink, Wheelchair Upper Body Dressing: Maximal assistance Where  Assessed-Upper Body Dressing: Wheelchair, Sitting at sink Lower Body Dressing: Dependent Where Assessed-Lower Body Dressing: Wheelchair Toileting: Dependent Where Assessed-Toileting: Bedside Commode Toilet Transfer: Dependent Toilet Transfer Method: Arts development officer: Art gallery manager    Praxis   Exercises:   Other Treatments:     Therapy/Group: Individual Therapy  Ihor Gully 01/18/2020, 12:19 PM

## 2020-01-18 NOTE — Progress Notes (Signed)
Orthopedic Tech Progress Note Patient Details:  Anna Campos 1966/06/18 [id number]  Patient ID: [name], female   DOB: 03-18-1966, 54 y.o.   MRN: [id number]   Saul Fordyce 01/18/2020, 2:38 PMRouted  Brace order to [facility].

## 2020-01-18 NOTE — Progress Notes (Signed)
[place] PHYSICAL MEDICINE & REHABILITATION PROGRESS NOTE   Subjective/Complaints:  Asking about how to file disability- explained I'm not clear on this, but SW might be able to help.   LBM last night- did on BSC_ has control over it.  No bowel program last night.   No cathing required since first day and started flomax.   When has to sit/stand- pulls in neck when leans forward- could rather scoot forward.    ROS:   Pt denies SOB, abd pain, CP, N/V/C/D, and vision changes  Objective:   No results found. No results for input(s): WBC, HGB, HCT, PLT in the last 72 hours. Recent Labs    01/17/20 0630  NA 139  K 4.3  CL 102  CO2 26  GLUCOSE 93  BUN 17  CREATININE 0.82  CALCIUM 9.8    Intake/Output Summary (Last 24 hours) at 01/18/2020 1000 Last data filed at 01/18/2020 0945 Gross per 24 hour  Intake 600 ml  Output --  Net 600 ml     Physical Exam: Vital Signs Blood pressure 126/83, pulse 71, temperature 97.9 F (36.6 C), temperature source Oral, resp. rate 18, height 5\' 9"  (1.753 m), weight 54 kg, SpO2 100 %.  Gen: awake, alert, appropriate, sitting up in bedside chair, NAD HENT: wearing soft collar- anterior incision looks great- (+) steristrips CV: RRR- not tachy today Pulm: CTA B/L good air movement GI: soft, NT, ND, (+)BS Skin: Warm and dry.  Intact. Psych: appropriate, smiling Musc: No edema in extremities.  No tenderness in extremities. Neuro: Ox3- no clonus, no hoffman's no increased tone  Motor: RUE: Shoulder abduction 2/5, elbow flexion/extension 3/5, handgrip 2/5, unchanged LUE: Shoulder abduction 2+/5 proximal distal, elbow flexion/extension 3/5, and grip 3 -/5 Left lower extremity: 4/5 proximal distal Right lower extremity: Hip flexion, knee extension 3 +/5, ankle dorsiflexion 2/5  Assessment/Plan: 1. Functional deficits secondary to C5 ASIA D due to osteomyelitis which require 3+ hours per day of interdisciplinary therapy in a comprehensive  inpatient rehab setting.  Physiatrist is providing close team supervision and 24 hour management of active medical problems listed below.  Physiatrist and rehab team continue to assess barriers to discharge/monitor patient progress toward functional and medical goals  Care Tool:  Bathing    Body parts bathed by patient: Chest, Abdomen, Right upper leg, Left upper leg, Face, Front perineal area   Body parts bathed by helper: Right arm, Left arm, Right lower leg, Left lower leg     Bathing assist Assist Level: Maximal Assistance - Patient 24 - 49%     Upper Body Dressing/Undressing Upper body dressing   What is the patient wearing?: Pull over shirt    Upper body assist Assist Level: Maximal Assistance - Patient 25 - 49%    Lower Body Dressing/Undressing Lower body dressing      What is the patient wearing?: Incontinence brief, Pants     Lower body assist Assist for lower body dressing: Maximal Assistance - Patient 25 - 49%     Toileting Toileting    Toileting assist Assist for toileting: 2 Helpers     Transfers Chair/bed transfer  Transfers assist     Chair/bed transfer assist level: Moderate Assistance - Patient 50 - 74%     Locomotion Ambulation   Ambulation assist      Assist level: 2 helpers Assistive device: Walker-platform Max distance: 100'   Walk 10 feet activity   Assist     Assist level: 2 helpers Assistive device:  Walker-platform   Walk 50 feet activity   Assist Walk 50 feet with 2 turns activity did not occur: Safety/medical concerns  Assist level: 2 helpers Assistive device: Walker-platform    Walk 150 feet activity   Assist Walk 150 feet activity did not occur: Safety/medical concerns         Walk 10 feet on uneven surface  activity   Assist Walk 10 feet on uneven surfaces activity did not occur: Safety/medical concerns         Wheelchair     Assist Will patient use wheelchair at discharge?: Yes Type  of Wheelchair: Manual    Wheelchair assist level: Supervision/Verbal cueing Max wheelchair distance: 94'    Wheelchair 50 feet with 2 turns activity    Assist        Assist Level: Dependent - Patient 0%   Wheelchair 150 feet activity     Assist      Assist Level: Dependent - Patient 0%   Blood pressure 126/83, pulse 71, temperature 97.9 F (36.6 C), temperature source Oral, resp. rate 18, height 5\' 9"  (1.753 m), weight 54 kg, SpO2 100 %.  Medical Problem List and Plan: 1.  Quadriplegia secondary to C5 incomplete ASIA C initially, now D spinal cord injury.S/P C3-4 4-55-6 and C6-7 anterior cervical decompression and fusion 01/04/2020.  Cervical brace as directed  Continue CIR  2.  Antithrombotics/pulmonary emboli per CT angiogram of chest 12/20/2019: Lower extremity Doppler negative.   -DVT/anticoagulation: Intravenous heparin transitioned to Eliquis 01/11/2020             -antiplatelet therapy: N/A 3. Pain Management: Oxycodone and Robaxin as needed  3/30- pain controlled- con't meds prn 4. Mood: Provide emotional support             -antipsychotic agents: N/A 5. Neuropsych: This patient is capable of making decisions on her own behalf. 6. Skin/Wound Care: Routine skin checks 7. Fluids/Electrolytes/Nutrition: Routine in and outs 8.  Anemia.  EGD completed 12/27/2019 per GI services.  Recommend PPI x2 months.  H. pylori serology positive.  Complete course of amoxicillin and Biaxin  Hemoglobin 11.0 on 3/24  Continue to monitor 9. Multiple dental caries.  Follow-up dental services after multiple extractions 12/30/2019 10. Neurogenic bowel due to SCI.    Continue bowel program  3/29 - will change to QOD per pt's wishes/bowel habits  3/30- pt now able to control BMs so will stop bowel program 11 Streptococcal bacteremia.  Intravenous ceftriaxone completed. 12.  History of tobacco use.  Counseling 13. Spasticity- improved since surgery- for now, doesn't need spasticity  agents-  14. Neurogenic bladder/frequency  Pt was started on Flomax- working well- no caths required in last 24 hours- will monitor.   PVRs with low volumes  Urinary frequency improving, cont Enablex 7.5 on 3/27  3/30- voiding well- PVRs look great- will stop PVRs after makes sure not retaining with cessation of flomax- con't Enablex- for urgency- will stop Flomax and make sure she's not retaining.  15.  Hyponatremia  Sodium 133 on 3/24, labs ordered for tomorrow 16.  Transaminitis  LFTs elevated on 3/24, labs ordered for tomorrow  3/29- LFTs increase has resolved  LOS: 7 days A FACE TO FACE EVALUATION WAS PERFORMED  Anna Campos 01/18/2020, 10:00 AM

## 2020-01-18 NOTE — Progress Notes (Signed)
Occupational Therapy Session Note  Patient Details  Name: Anna Campos MRN: [id number] Date of Birth: 07-Sep-1966  Today's Date: 01/18/2020 OT Individual Time: 0900-1000 OT Individual Time Calculation (min): 60 min    Short Term Goals: Week 1:  OT Short Term Goal 1 (Week 1): Pt will self feed using universal cuff or foam grips and the RUE for 75% of meal with no more than min assist. OT Short Term Goal 2 (Week 1): Pt will complete UB bathing with min assist for 2 consecutive sessions with use of a washmit PRN. OT Short Term Goal 3 (Week 1): Pt will perform toilet transfer stand pivot with mod assist to the 3:1. OT Short Term Goal 4 (Week 1): Pt will complete UB dressing with min assist for pullover shirt only. OT Short Term Goal 5 (Week 1): Pt/family will return demonstrate safe completion of AAROM exercises for BUE following handout.  Skilled Therapeutic Interventions/Progress Updates:  Pt received seated in recliner agreeable to OT intervention. Pt sit<>stand from recliner with MODA; cues to not scoot out too far to edge of seat as pt had a near LOB d/t attempting to sit<>stand too close to edge. MIN- MOD A to ambulate from recliner to sink needing cues for PFRW mgmt as well as verbal cues to widen BOS during functional mobility. Pt completed seated UB grooming tasks needing set- up assist to wash face, and MIN A for oral care. Education provided on stabilization technique to keep toothbrush still while applying toothpaste. Pt requires MAXA for UB/LB dressing needing MOD A to sit<>stand from w/c to pull pants up to waist line. Education provided on dressing weaker extremity first as compensatory method with pt verbalizing understanding. Total A to don socks and shoes from w/c. Pt able to ambulate back to recliner in similar fashion as previously indicated. Pt left seated in recliner with legs elevated, chair alarm activated and all needs within reach.   Therapy Documentation Precautions:   Precautions Precautions: Fall, Cervical Precaution Comments: reviewed cervical precautions; orthostatic Required Braces or Orthoses: Cervical Brace Cervical Brace: Soft collar(collar can be removed for showering per Dr. Berline Chough) Restrictions Weight Bearing Restrictions: No Other Position/Activity Restrictions: collar at all times per chart General:   Vital Signs:   Pain: Pt reports no pain during session.   Therapy/Group: Individual Therapy  Angelina Pih 01/18/2020, 11:37 AM

## 2020-01-18 NOTE — Progress Notes (Signed)
Social Work Patient ID: Anna Campos, female   DOB: 09/21/1966, 54 y.o.   MRN: [id number]   SW met with pt in room to provide updates from team conference, and d/c date 02/05/2020. SW asked pt if she was able to get fax number for her HR office to send FMLA forms. Reports her husband took forms to employer. Pt reported that she has scheduled her SSDI phone interview, and is scheduled for Wed, 4/14 at [place] will continue to follow and assess pt for discharge needs.   Loralee Pacas, MSW, [place] Office: [phone] Cell: [phone] Fax: [phone]

## 2020-01-18 NOTE — Progress Notes (Signed)
Occupational Therapy Session Note  Patient Details  Name: Anna Campos MRN: [id number] Date of Birth: Nov 18, 1965  Today's Date: 01/18/2020 OT Individual Time: 1135-1200 OT Individual Time Calculation (min): 25 min    Short Term Goals: Week 1:  OT Short Term Goal 1 (Week 1): Pt will self feed using universal cuff or foam grips and the RUE for 75% of meal with no more than min assist. OT Short Term Goal 1 - Progress (Week 1): Met OT Short Term Goal 2 (Week 1): Pt will complete UB bathing with min assist for 2 consecutive sessions with use of a washmit PRN. OT Short Term Goal 2 - Progress (Week 1): Progressing toward goal OT Short Term Goal 3 (Week 1): Pt will perform toilet transfer stand pivot with mod assist to the 3:1. OT Short Term Goal 3 - Progress (Week 1): Met OT Short Term Goal 4 (Week 1): Pt will complete UB dressing with min assist for pullover shirt only. OT Short Term Goal 4 - Progress (Week 1): Progressing toward goal OT Short Term Goal 5 (Week 1): Pt/family will return demonstrate safe completion of AAROM exercises for BUE following handout. OT Short Term Goal 5 - Progress (Week 1): Progressing toward goal  Skilled Therapeutic Interventions/Progress Updates:    OT intervention with focus on sit<>stand and standing balance to increase independence with BADLs. Pt amb with PFRM approx 20' from recliner to w/c with min A. Pt noted with exagerrated weight shift onto LLE during ambulation with increased physical assistance required during ambulation. Pt transitioned to Day Room and performed sit<>stand and squats from EOM 8X5. Pt requires min/mod tactile input at back to maintain forward lean during sit<>stand and squats. Pt is able to power up without assistance. Pt returned to room and amb with PFRW to recliner with min A.  Pt remained in recliner with all needs within reach and seat alarm activated.   Therapy Documentation Precautions:  Precautions Precautions: Fall,  Cervical Precaution Comments: reviewed cervical precautions; orthostatic Required Braces or Orthoses: Cervical Brace Cervical Brace: Soft collar(collar can be removed for showering per Dr. Dagoberto Ligas) Restrictions Weight Bearing Restrictions: No Other Position/Activity Restrictions: collar at all times per chart   Pain: Pain Assessment Pain Scale: 0-10 Pain Score: 0-No pain  Therapy/Group: Individual Therapy  Leroy Libman 01/18/2020, 12:26 PM

## 2020-01-18 NOTE — Plan of Care (Signed)
  Problem: Consults Goal: RH SPINAL CORD INJURY PATIENT EDUCATION Description:  See Patient Education module for education specifics.  Outcome: Progressing   Problem: SCI BOWEL ELIMINATION Goal: RH STG MANAGE BOWEL WITH ASSISTANCE Description: STG Manage Bowel with mod I Assistance. Outcome: Progressing   Problem: SCI BLADDER ELIMINATION Goal: RH STG MANAGE BLADDER WITH ASSISTANCE Description: STG Manage Bladder With Mod I Assistance Outcome: Progressing   Problem: RH SKIN INTEGRITY Goal: RH STG MAINTAIN SKIN INTEGRITY WITH ASSISTANCE Description: STG Maintain Skin Integrity With mod I Assistance. Outcome: Progressing   Problem: RH SAFETY Goal: RH STG ADHERE TO SAFETY PRECAUTIONS W/ASSISTANCE/DEVICE Description: STG Adhere to Safety Precautions With cues and reminders  Outcome: Progressing Goal: RH STG DECREASED RISK OF FALL WITH ASSISTANCE Description: STG Decreased Risk of Fall With mod I Assistance. Outcome: Progressing   Problem: RH PAIN MANAGEMENT Goal: RH STG PAIN MANAGED AT OR BELOW PT'S PAIN GOAL Description: Pain level less than 4 on scale of 0-10 Outcome: Progressing   Problem: RH KNOWLEDGE DEFICIT SCI Goal: RH STG INCREASE KNOWLEDGE OF SELF CARE AFTER SCI Description: Pt will be able to manage bowel program with min assist from family. Pt will be able to adhere to medication regimen with mod I assist from family.  Outcome: Progressing   

## 2020-01-19 ENCOUNTER — Inpatient Hospital Stay (HOSPITAL_COMMUNITY): Payer: Commercial Managed Care - PPO

## 2020-01-19 ENCOUNTER — Inpatient Hospital Stay (HOSPITAL_COMMUNITY): Payer: Commercial Managed Care - PPO | Admitting: Physical Therapy

## 2020-01-19 LAB — CBC WITH DIFFERENTIAL/PLATELET
Abs Immature Granulocytes: 0.02 10*3/uL (ref 0.00–0.07)
Basophils Absolute: 0.1 10*3/uL (ref 0.0–0.1)
Basophils Relative: 1 %
Eosinophils Absolute: 0.2 10*3/uL (ref 0.0–0.5)
Eosinophils Relative: 3 %
HCT: 35.6 % — ABNORMAL LOW (ref 36.0–46.0)
Hemoglobin: 11.1 g/dL — ABNORMAL LOW (ref 12.0–15.0)
Immature Granulocytes: 0 %
Lymphocytes Relative: 30 %
Lymphs Abs: 2.2 10*3/uL (ref 0.7–4.0)
MCH: 30.1 pg (ref 26.0–34.0)
MCHC: 31.2 g/dL (ref 30.0–36.0)
MCV: 96.5 fL (ref 80.0–100.0)
Monocytes Absolute: 0.6 10*3/uL (ref 0.1–1.0)
Monocytes Relative: 8 %
Neutro Abs: 4.4 10*3/uL (ref 1.7–7.7)
Neutrophils Relative %: 58 %
Platelets: 520 10*3/uL — ABNORMAL HIGH (ref 150–400)
RBC: 3.69 MIL/uL — ABNORMAL LOW (ref 3.87–5.11)
RDW: 13.1 % (ref 11.5–15.5)
WBC: 7.5 10*3/uL (ref 4.0–10.5)
nRBC: 0 % (ref 0.0–0.2)

## 2020-01-19 LAB — BASIC METABOLIC PANEL
Anion gap: 9 (ref 5–15)
BUN: 19 mg/dL (ref 6–20)
CO2: 27 mmol/L (ref 22–32)
Calcium: 9.9 mg/dL (ref 8.9–10.3)
Chloride: 100 mmol/L (ref 98–111)
Creatinine, Ser: 0.8 mg/dL (ref 0.44–1.00)
GFR calc Af Amer: >60 mL/min (ref >60)
GFR calc non Af Amer: >60 mL/min (ref >60)
Glucose, Bld: 89 mg/dL (ref 70–99)
Potassium: 4.6 mmol/L (ref 3.5–5.1)
Sodium: 136 mmol/L (ref 135–145)

## 2020-01-19 NOTE — Progress Notes (Addendum)
Social Work Patient ID: Anna Campos, female   DOB: 1966/02/11, 54 y.o.   MRN: [id number]   SW received call from Jaclynne/[facility] (p:[phone]/[phone]) requesting next review date due on 4/6. SW informed will relay information to appropriate staff.  Cecile Sheerer, MSW, LCSWA Office: [phone] Cell: [phone] Fax: [phone]

## 2020-01-19 NOTE — Progress Notes (Signed)
Physical Therapy Session Note  Patient Details  Name: Adanely Reynoso MRN: [id number] Date of Birth: 05/07/1966  Today's Date: 01/19/2020 PT Individual Time: 1105-1202 PT Individual Time Calculation (min): 57 min   Short Term Goals: Week 1:  PT Short Term Goal 1 (Week 1): Pt will complete least restrictive transfers with assist x 1 PT Short Term Goal 2 (Week 1): Pt will ambulate x 50 ft with LRAD PT Short Term Goal 3 (Week 1): Pt will maintain static standing balance with min A  Skilled Therapeutic Interventions/Progress Updates:   Pt received supine in bed and agreeable to PT. Per OT, pt had syncopal episode in eariler treatment.  PT applied knee high Teds. Orthostatic Vital signs  Supine 106/65, HR 73.  Sitting. 89/62 HR 82.  Standing 68/64. HR 109. Pt reports increased orthostatic s/s. Re turned to sitting.  Sitting 85/64. Mile remaining s/s.  Sitting 1 min 94/74. Pt returned to supine. Once in supine, all orthostatic s/s decreased.   PT applied thigh high Teds Orthostatic vital sign.  Supine 110/76. HR 87 Sitting 84/72. HR 90 Standing 62/47, HR 108 severe s/s of orthostatic Hypotension.   Pt returned to sitting EOB, once s/s resolved, PT assisted with stand pivot transfer to [facility] with min assist and BUE supported on PT.   Seated BLE therex. LAQ, HS curls, hip abduction, hip abduction, reciprocal hip flexion. Each completed with level 2 tband x 10 BLE. Cues for full ROM, and decreased speed to maximize strengthening aspect of movement. Hand out provided.      Pt left sitting in recliner with call bell in reach and all needs met.        Therapy Documentation Precautions:  Precautions Precautions: Fall, Cervical Precaution Comments: reviewed cervical precautions; orthostatic Required Braces or Orthoses: Cervical Brace Cervical Brace: Soft collar(collar can be removed for showering per Dr. Dagoberto Ligas) Restrictions Weight Bearing Restrictions: No Other Position/Activity  Restrictions: collar at all times per chart Vital Signs: Therapy Vitals Pulse Rate: 83 BP: 99/73 Patient Position (if appropriate): Sitting Oxygen Therapy SpO2: 100 % Pain: denies   Therapy/Group: Individual Therapy  Lorie Phenix 01/19/2020, 12:27 PM

## 2020-01-19 NOTE — Progress Notes (Signed)
Patient reported to staff about feeling dizzy and light headed. Patients VS have been checked as followed "99/73 and 97/63". Reported to [place], Dan. Instructed patient to rest a few minutes after reclining from chair and before standing up. Patient was assisted to bed from recliner. Patient denies and pains or dizziness. Will continue to monitor. Leane Para, LPN

## 2020-01-19 NOTE — Progress Notes (Signed)
Occupational Therapy Session Note  Patient Details  Name: Anna Campos MRN: [id number] Date of Birth: 1965-12-30  Today's Date: 01/19/2020 OT Individual Time: 1300-1315 OT Individual Time Calculation (min): 15 min  and Today's Date: 01/19/2020 OT Missed Time: 60 Minutes Missed Time Reason: Other (comment);Patient fatigue(hypotension)   Short Term Goals: Week 2:  OT Short Term Goal 1 (Week 2): Pt will complete UB bathing with min assist for 2 consecutive sessions with use of a washmit PRN. OT Short Term Goal 2 (Week 2): Pt will complete UB dressing with min assist for pullover shirt only. OT Short Term Goal 3 (Week 2): Pt will complete UB dressing with min assist for pullover shirt only. OT Short Term Goal 4 (Week 2): Pt/family will return demonstrate safe completion of AAROM exercises for BUE following handout. OT Short Term Goal 5 (Week 2): Pt/family will return demonstrate safe completion of AAROM exercises for BUE following handout.  Skilled Therapeutic Interventions/Progress Updates:    Pt asleep in bed upon arrival and easily aroused.  Discussed pt's hypotension during day and pt commented that she just doesn't feel well today. Discussed discharge plan and LTGs. Pt commented that she just doesn't feel like she can do anything.  Agreed to try later.  Returned 40 mins later and pt remained asleep.  Pt missed 60 min skilled OT services.   Therapy Documentation Precautions:  Precautions Precautions: Fall, Cervical Precaution Comments: reviewed cervical precautions; orthostatic Required Braces or Orthoses: Cervical Brace Cervical Brace: Soft collar(collar can be removed for showering per Dr. Berline Chough) Restrictions Weight Bearing Restrictions: No Other Position/Activity Restrictions: collar at all times per chart General: General OT Amount of Missed Time: 60 Minutes Pain:  Pt with no c/o pain but commented that she doesn't "feel good" today   Therapy/Group: Individual  Therapy  [name] 01/19/2020, 1:58 PM

## 2020-01-19 NOTE — Progress Notes (Signed)
Occupational Therapy Session Note  Patient Details  Name: Anna Campos MRN: [id number] Date of Birth: 1966-02-14  Today's Date: 01/19/2020 OT Individual Time: 0900-0950 OT Individual Time Calculation (min): 50 min  and Today's Date: 01/19/2020 OT Missed Time: 10 Minutes Missed Time Reason: Other (comment)(hypotension)   Short Term Goals: Week 2:  OT Short Term Goal 1 (Week 2): Pt will complete UB bathing with min assist for 2 consecutive sessions with use of a washmit PRN. OT Short Term Goal 2 (Week 2): Pt will complete UB dressing with min assist for pullover shirt only. OT Short Term Goal 3 (Week 2): Pt will complete UB dressing with min assist for pullover shirt only. OT Short Term Goal 4 (Week 2): Pt/family will return demonstrate safe completion of AAROM exercises for BUE following handout. OT Short Term Goal 5 (Week 2): Pt/family will return demonstrate safe completion of AAROM exercises for BUE following handout.  Skilled Therapeutic Interventions/Progress Updates:  Pt received asleep in recliner however easily able to arouse and agreeable to OT intervention. Pt reports feeling "weird" after waking up. Took BP sitting 82/ 63,waited ~ 2 mins with BP 84/69. Pt completed sit<>stand from recliner with MODA  to assess BP in standing with pt urgently stating " something's wrong" sitting down abruptly into chair. Reclined pt and elevated feet with pt  frantically continuing to state, "whats wrong with me." Feel pt likely nearing presyncopal episode. BP not fully assessed in standing however 75/56 once in chair. RN enter to assess pts BP improving to 93/71 after sitting reclined in chair ~ 6 mins. Pt returned to bed with MIN- MOD A for safety via stand pivot transfer with PFRW. Pt missed 10 mins of skilled OT intervention to allow RN to assess pt. Returned to pt with pt reporting feeling "much better." Pt completed bed level BUE therex where pt able to cross midline to retrieve various objects and  place on opposite sides of table. Issued pt written HEP to assist with [facility]; pt able to return demo therex with good carryover. Pt left supine in bed continuing to work on therex with bed alarm activated and all needs within reach.   Therapy Documentation Precautions:  Precautions Precautions: Fall, Cervical Precaution Comments: reviewed cervical precautions; orthostatic Required Braces or Orthoses: Cervical Brace Cervical Brace: Soft collar(collar can be removed for showering per Dr. Berline Chough) Restrictions Weight Bearing Restrictions: No Other Position/Activity Restrictions: collar at all times per chart General: General OT Amount of Missed Time: 10 Minutes Vital Signs: Therapy Vitals Pulse Rate: 83 BP: 99/73 Patient Position (if appropriate): Sitting Oxygen Therapy SpO2: 100 % Pain: Pt reports no pain during session.  Therapy/Group: Individual Therapy  Angelina Pih 01/19/2020, 10:26 AM

## 2020-01-19 NOTE — Plan of Care (Signed)
  Problem: Consults Goal: RH SPINAL CORD INJURY PATIENT EDUCATION Description:  See Patient Education module for education specifics.  Outcome: Progressing   Problem: SCI BOWEL ELIMINATION Goal: RH STG MANAGE BOWEL WITH ASSISTANCE Description: STG Manage Bowel with mod I Assistance. Outcome: Progressing   Problem: SCI BLADDER ELIMINATION Goal: RH STG MANAGE BLADDER WITH ASSISTANCE Description: STG Manage Bladder With Mod I Assistance Outcome: Progressing   Problem: RH SKIN INTEGRITY Goal: RH STG MAINTAIN SKIN INTEGRITY WITH ASSISTANCE Description: STG Maintain Skin Integrity With mod I Assistance. Outcome: Progressing   Problem: RH SAFETY Goal: RH STG ADHERE TO SAFETY PRECAUTIONS W/ASSISTANCE/DEVICE Description: STG Adhere to Safety Precautions With cues and reminders  Outcome: Progressing Goal: RH STG DECREASED RISK OF FALL WITH ASSISTANCE Description: STG Decreased Risk of Fall With mod I Assistance. Outcome: Progressing   Problem: RH PAIN MANAGEMENT Goal: RH STG PAIN MANAGED AT OR BELOW PT'S PAIN GOAL Description: Pain level less than 4 on scale of 0-10 Outcome: Progressing   Problem: RH KNOWLEDGE DEFICIT SCI Goal: RH STG INCREASE KNOWLEDGE OF SELF CARE AFTER SCI Description: Pt will be able to manage bowel program with min assist from family. Pt will be able to adhere to medication regimen with mod I assist from family.  Outcome: Progressing   

## 2020-01-20 ENCOUNTER — Inpatient Hospital Stay (HOSPITAL_COMMUNITY): Payer: Commercial Managed Care - PPO | Admitting: *Deleted

## 2020-01-20 ENCOUNTER — Inpatient Hospital Stay (HOSPITAL_COMMUNITY): Payer: Commercial Managed Care - PPO | Admitting: Physical Therapy

## 2020-01-20 MED ORDER — FERROUS GLUCONATE 324 (38 FE) MG PO TABS
324.0000 mg | ORAL_TABLET | Freq: Every day | ORAL | Status: DC
  Administered 2020-01-21 – 2020-01-27 (×7): 324 mg via ORAL
  Filled 2020-01-20 (×7): qty 1

## 2020-01-20 MED ORDER — FLUDROCORTISONE ACETATE 0.1 MG PO TABS
0.1000 mg | ORAL_TABLET | Freq: Every day | ORAL | Status: DC
  Administered 2020-01-20 – 2020-01-25 (×6): 0.1 mg via ORAL
  Filled 2020-01-20 (×6): qty 1

## 2020-01-20 NOTE — Progress Notes (Signed)
Occupational Therapy Session Note  Patient Details  Name: Anna Campos MRN: [id number] Date of Birth: 04-Jan-1966  Today's Date: 01/20/2020 OT Individual Time: 1330-1410 OT Individual Time Calculation (min): 40 min  and Today's Date: 01/20/2020 OT Missed Time: 20 Minutes Missed Time Reason: Other (comment)(hypotension)   Short Term Goals: Week 2:  OT Short Term Goal 1 (Week 2): Pt will complete UB bathing with min assist for 2 consecutive sessions with use of a washmit PRN. OT Short Term Goal 2 (Week 2): Pt will complete UB dressing with min assist for pullover shirt only. OT Short Term Goal 3 (Week 2): Pt will complete UB dressing with min assist for pullover shirt only. OT Short Term Goal 4 (Week 2): Pt/family will return demonstrate safe completion of AAROM exercises for BUE following handout. OT Short Term Goal 5 (Week 2): Pt/family will return demonstrate safe completion of AAROM exercises for BUE following handout.  Skilled Therapeutic Interventions/Progress Updates:    Pt asleep upon arrival with husband present.  Easily aroused.  OT intervention with focus on sitting and standing tolerance and functional transfers. See below for BP.  Pt symptomatic when standing-light headed and ringing in ears. Symptoms delayed by approx 15 secs-30 seconds. Discussed use of Ace wraps tomorrow, in addition to thigh high Teds and abdominal binder. Pt tranfserred back to bed with min A stand pivot.  Pt remained in bed with all needs within reach and bed alarm activated.   Therapy Documentation Precautions:  Precautions Precautions: Fall, Cervical Precaution Comments: reviewed cervical precautions; orthostatic Required Braces or Orthoses: Cervical Brace Cervical Brace: Soft collar(collar can be removed for showering per Dr. Berline Chough) Restrictions Weight Bearing Restrictions: No Other Position/Activity Restrictions: collar at all times per chart General: General OT Amount of Missed Time: 20  Minutes Vital Signs: Therapy Vitals Patient Position (if appropriate): Orthostatic Vitals Supine-103/63, 93 Sitting-90/60,98 Standing-61/47, 115 Pain:  Pt denies pain this afternoon  Therapy/Group: Individual Therapy  [name] 01/20/2020, 2:21 PM

## 2020-01-20 NOTE — Progress Notes (Signed)
Occupational Therapy Session Note  Patient Details  Name: Anna Campos MRN: [id number] Date of Birth: [birth year]  Today's Date: 01/20/2020 OT Individual Time: 0900-1000 OT Individual Time Calculation (min): 60 min    Short Term Goals: Week 2:  OT Short Term Goal 1 (Week 2): Pt will complete UB bathing with min assist for 2 consecutive sessions with use of a washmit PRN. OT Short Term Goal 2 (Week 2): Pt will complete UB dressing with min assist for pullover shirt only. OT Short Term Goal 3 (Week 2): Pt will complete UB dressing with min assist for pullover shirt only. OT Short Term Goal 4 (Week 2): Pt/family will return demonstrate safe completion of AAROM exercises for BUE following handout. OT Short Term Goal 5 (Week 2): Pt/family will return demonstrate safe completion of AAROM exercises for BUE following handout.  Skilled Therapeutic Interventions/Progress Updates:  Pt received in recliner stating she feels much better today. BP checked seated in recliner 101/68. Pt completed sit<>stand with PFRW and MOD A with BP dropping to 86/65 and pt reporting feeling "weird" with ringing in ears. Donned ABD binder and TED hose with pt able to stand pivot to w/c with MIN A +2 for safety with no c/o dizziness. Pt completed seated UB/LB bathing at sink; MIN hand over hand assist to wash BUEs. MOD A for UB dressing to thread BUEs through sleeves and assist to get shirt over collar; MAX A for LB dressing with pt unable to figure four. Pt completed sit<>stand from w/c to pull pants up to waist line with MODA. Pt completed seated oral care with MIN A as requires assist to initially manipulate toothbrush and toothpaste. Total A to apply deodorant for time mgmt. BP checked seated in w/c after grooming 92/71 Pt able to ambulate ~ 4 ft back to recliner with MIN A +2 for safety with no reports of dizziness. BP checked in chair 100/75. Pt left seated in recliner with chair alarm activated, RN present and all needs  within reach.   Therapy Documentation Precautions:  Precautions Precautions: Fall, Cervical Precaution Comments: reviewed cervical precautions; orthostatic Required Braces or Orthoses: Cervical Brace Cervical Brace: Soft collar(collar can be removed for showering per Dr. Berline Chough) Restrictions Weight Bearing Restrictions: No Other Position/Activity Restrictions: collar at all times per chart General:   Vital Signs: Therapy Vitals BP: (!) 86/65 Patient Position (if appropriate): Standing Pain: Pt reports no pain during session.   Therapy/Group: Individual Therapy  Angelina Pih 01/20/2020, 11:34 AM

## 2020-01-20 NOTE — Progress Notes (Signed)
Orthopedic Tech Progress Note Patient Details:  Anna Campos 14-Dec-1965 [id number] RN called requesting an ABDOMINAL BINDER. Patient said she wanted to wait until she took her morning bath. I said yes ma'am and notified the RN. Ortho Devices Type of Ortho Device: Abdominal binder Ortho Device/Splint Location: stomach Ortho Device/Splint Interventions: Ordered   Post Interventions Patient Tolerated: Other (comment) Instructions Provided: Care of device   Donald Pore 01/20/2020, 9:17 AM

## 2020-01-20 NOTE — Progress Notes (Signed)
[place] PHYSICAL MEDICINE & REHABILITATION PROGRESS NOTE   Subjective/Complaints:  Pt reports BP was low yesterday- clammy and dizzy/lightheaded- feels better today.  "[name]""- pt feels should take iron to treat anemia so will "help BP"- I explained we need TEDS and abd binder to treat low BP.  Due to SCI.  ROS:   Pt denies SOB, abd pain, CP, N/V/C/D, and vision changes   Objective:   No results found. Recent Labs    01/19/20 0439  WBC 7.5  HGB 11.1*  HCT 35.6*  PLT 520*   Recent Labs    01/19/20 0439  NA 136  K 4.6  CL 100  CO2 27  GLUCOSE 89  BUN 19  CREATININE 0.80  CALCIUM 9.9    Intake/Output Summary (Last 24 hours) at 01/20/2020 1034 Last data filed at 01/19/2020 1846 Gross per 24 hour  Intake 240 ml  Output 0 ml  Net 240 ml     Physical Exam: Vital Signs Blood pressure (!) 86/65, pulse 71, temperature 98.1 F (36.7 C), resp. rate 18, height 5\' 9"  (1.753 m), weight 53.4 kg, SpO2 100 %.  Gen: awake, sitting up in bedside chair, appropriate, NAD HENT: wearing soft collar- missing all teeth-  CV: RRR- BP 86/65 this AM- was 110s/60s earlier Pulm: CTA b?L- good air movement GI: soft, NT, ND, (+)BS normoactive Skin: Warm and dry.  Intact. Psych: playing jokes on examiner Musc: No edema in extremities.  No tenderness in extremities. Neuro: Ox3- no clonus, no hoffman's no increased tone  Motor: RUE: Shoulder abduction 2/5, elbow flexion/extension 3/5, handgrip 2/5, unchanged LUE: Shoulder abduction 2+/5 proximal distal, elbow flexion/extension 3/5, and grip 3 -/5 Left lower extremity: 4/5 proximal distal Right lower extremity: Hip flexion, knee extension 3 +/5, ankle dorsiflexion 2/5  Assessment/Plan: 1. Functional deficits secondary to C5 ASIA D due to osteomyelitis which require 3+ hours per day of interdisciplinary therapy in a comprehensive inpatient rehab setting.  Physiatrist is providing close team supervision and 24 hour management of  active medical problems listed below.  Physiatrist and rehab team continue to assess barriers to discharge/monitor patient progress toward functional and medical goals  Care Tool:  Bathing    Body parts bathed by patient: Face   Body parts bathed by helper: Right arm, Left arm, Right lower leg, Left lower leg     Bathing assist Assist Level: Set up assist     Upper Body Dressing/Undressing Upper body dressing   What is the patient wearing?: Pull over shirt    Upper body assist Assist Level: Maximal Assistance - Patient 25 - 49%    Lower Body Dressing/Undressing Lower body dressing      What is the patient wearing?: Pants     Lower body assist Assist for lower body dressing: Maximal Assistance - Patient 25 - 49%     Toileting Toileting    Toileting assist Assist for toileting: 2 Helpers     Transfers Chair/bed transfer  Transfers assist     Chair/bed transfer assist level: Minimal Assistance - Patient > 75%     Locomotion Ambulation   Ambulation assist      Assist level: 2 helpers Assistive device: Walker-platform Max distance: 100'   Walk 10 feet activity   Assist     Assist level: 2 helpers Assistive device: Walker-platform   Walk 50 feet activity   Assist Walk 50 feet with 2 turns activity did not occur: Safety/medical concerns  Assist level: 2 helpers Assistive device:  Walker-platform    Walk 150 feet activity   Assist Walk 150 feet activity did not occur: Safety/medical concerns         Walk 10 feet on uneven surface  activity   Assist Walk 10 feet on uneven surfaces activity did not occur: Safety/medical concerns         Wheelchair     Assist Will patient use wheelchair at discharge?: Yes Type of Wheelchair: Manual    Wheelchair assist level: Supervision/Verbal cueing Max wheelchair distance: 78'    Wheelchair 50 feet with 2 turns activity    Assist        Assist Level: Dependent - Patient 0%    Wheelchair 150 feet activity     Assist      Assist Level: Dependent - Patient 0%   Blood pressure (!) 86/65, pulse 71, temperature 98.1 F (36.7 C), resp. rate 18, height 5\' 9"  (1.753 m), weight 53.4 kg, SpO2 100 %.  Medical Problem List and Plan: 1.  Quadriplegia secondary to C5 incomplete ASIA C initially, now D spinal cord injury.S/P C3-4 4-55-6 and C6-7 anterior cervical decompression and fusion 01/04/2020.  Cervical brace as directed  Continue CIR  2.  Antithrombotics/pulmonary emboli per CT angiogram of chest 12/20/2019: Lower extremity Doppler negative.   -DVT/anticoagulation: Intravenous heparin transitioned to Eliquis 01/11/2020             -antiplatelet therapy: N/A 3. Pain Management: Oxycodone and Robaxin as needed  3/30- pain controlled- con't meds prn 4. Mood: Provide emotional support             -antipsychotic agents: N/A 5. Neuropsych: This patient is capable of making decisions on her own behalf. 6. Skin/Wound Care: Routine skin checks 7. Fluids/Electrolytes/Nutrition: Routine in and outs 8.  Anemia.  EGD completed 12/27/2019 per GI services.  Recommend PPI x2 months.  H. pylori serology positive.  Complete course of amoxicillin and Biaxin  Hemoglobin 11.0 on 3/24   4/1- pt wants iron- will start Ferrous gluconate  Continue to monitor 9. Multiple dental caries.  Follow-up dental services after multiple extractions 12/30/2019 10. Neurogenic bowel due to SCI.    Continue bowel program  3/29 - will change to QOD per pt's wishes/bowel habits  3/30- pt now able to control BMs so will stop bowel program 11 Streptococcal bacteremia.  Intravenous ceftriaxone completed. 12.  History of tobacco use.  Counseling 13. Spasticity- improved since surgery- for now, doesn't need spasticity agents-  14. Neurogenic bladder/frequency  Pt was started on Flomax- working well- no caths required in last 24 hours- will monitor.   PVRs with low volumes  Urinary frequency improving,  cont Enablex 7.5 on 3/27  3/30- voiding well- PVRs look great- will stop PVRs after makes sure not retaining with cessation of flomax- con't Enablex- for urgency- will stop Flomax and make sure she's not retaining.   4/1- not retaining-will stop PVRs 15.  Hyponatremia  Sodium 133 on 3/24, labs ordered for tomorrow 16.  Transaminitis  LFTs elevated on 3/24, labs ordered for tomorrow  3/29- LFTs increase has resolved 17. Orthostatic hypotension  4/1- will add TEDS, abd binder and Florinef 0.1 mg daily since was so low and still low this AM  LOS: 9 days A FACE TO FACE EVALUATION WAS PERFORMED  Anna Campos 01/20/2020, 10:34 AM

## 2020-01-20 NOTE — Progress Notes (Addendum)
Recreational Therapy Assessment and Plan  Patient Details  Name: Anna Campos MRN: [id number] Date of Birth: 04/17/1966 Today's Date: 01/20/2020  Rehab Potential:  Good ELOS:   d/c4/17   Assessment Problem List:      Patient Active Problem List   Diagnosis Date Noted  . Neurogenic bowel 01/11/2020  . Neurogenic bladder 01/11/2020  . Malnutrition of moderate degree 01/01/2020  . Poor dentition   . Spinal stenosis of cervical region   . Incomplete quadriplegia at C5-6 level ([place])   . Hypotension   . Bacteremia   . Syncope 12/19/2019    Past Medical History:      Past Medical History:  Diagnosis Date  . PE (pulmonary thromboembolism) ([place]) 12/20/2019  . Syncope 12/20/2019   Past Surgical History:       Past Surgical History:  Procedure Laterality Date  . ANTERIOR CERVICAL DECOMPRESSION/DISCECTOMY FUSION 4 LEVELS N/A 01/04/2020   Procedure: CERVICAL THREE- FOUR, CERVICAL FOUR-FIVE, CERVICAL FIVE- SIX, CERVCAL SIX- SEVEN ANTERIOR CERVICAL DECOMPRESSION/DISCECTOMY FUSION;  Surgeon: Earnie Larsson, MD;  Location: [place];  Service: Neurosurgery;  Laterality: N/A;  CERVICAL THREE- FOUR, CERVICAL FOUR-FIVE, CERVICAL FIVE- SIX, CERVCAL SIX- SEVEN ANTERIOR CERVICAL DECOMPRESSION/DISCECTOMY FUSION  . CESAREAN SECTION    . ESOPHAGOGASTRODUODENOSCOPY (EGD) WITH PROPOFOL N/A 12/27/2019   Procedure: ESOPHAGOGASTRODUODENOSCOPY (EGD) WITH PROPOFOL;  Surgeon: Ronnette Juniper, MD;  Location: [place];  Service: Gastroenterology;  Laterality: N/A;  PLEASE ALERT ANESTHESIA TO THE FACT THAT THE PATIENT HAS SUSTAINED A NECK INJURY AND HAS A QUADRIPARESIS  . HEMOSTASIS CLIP PLACEMENT  12/27/2019   Procedure: HEMOSTASIS CLIP PLACEMENT;  Surgeon: Ronnette Juniper, MD;  Location: [facility] ENDOSCOPY;  Service: Gastroenterology;;  . MULTIPLE EXTRACTIONS WITH ALVEOLOPLASTY N/A 12/30/2019   Procedure: Extraction of tooth #'s 2-8, 11-14, and 20-28 with alveoloplasty and bilateral mandibular lingual tori  reductions.;  Surgeon: Lenn Cal, DDS;  Location: [place];  Service: Oral Surgery;  Laterality: N/A;    Assessment & Plan Clinical Impression:  Anna Campos is a 54 year old right-handed female with unremarkable past medical history except tobacco abuse on no prescription medications. Per chart review lives with spouse and 48 year old daughter. Independent prior to admission. She works in Therapist, art for [employer]. 1 level home 3 steps to entry. Presented 12/18/2019 with syncope, orthostasis and generalized fatigue as well as reported fall by her family and questionable loss of consciousness. Admission chemistries and blood culture Streptococcus, alcohol negative, lactic acid 1.8. Cranial CT scan negative. Echocardiogram with ejection fraction of 65% without emboli. CTA of chest abdomen pelvis negative. CT angiogram of the chest showed small nonocclusive pulmonary emboli bilaterally involving segmental and subsegmental size vessels. Bilateral lower extremity Dopplers negative. Patient was placed on heparin therapy. MRI of the brain no evidence of acute intracranial abnormality. Infectious disease consulted for streptococcal bacteremia maintained on Rocephin x14 days initiated 12/20/2019. TEE showed no vegetation. CT of cervical spine showed prominent spinal cord signal abnormality at the C4-C6 levels. Involvement of the entire cord cross-section at the lower C5 level. Edema signal within the interspinous spaces at the C3-T2 levels. C4-C5 posterior disc osteophyte complex contributes to severe spinal canal stenosis with spinal cord flattening. Neurosurgery Dr. Annette Stable consulted and plan would be for surgical intervention with decompression and stabilization after antibiotic therapy completed. Hospital course complicated by hemoglobin 6.8 from admission hemoglobin 10.7 patient did receive 2 units packed red blood cells 12/25/2019. Patient occult heme positive. Gastroenterology  services Dr. Cristina Gong consulted underwent EGD 12/27/2019 showing normal-appearing esophagus. Mild erythematous mucosa in  the entire gastric cavity. 2 superficial prepyloric ulcers 1 with clean base and the other with pigmentation. 1 Endo Clip was deployed to close the mucosal defect. Normal-appearing cardia and fundus. H. pylori serology positive placed on triple therapy clindamycin amoxicillin PPI 12/30/2019 patient was cleared to resume intravenous heparin and PPI was added twice daily for 2 months. Her latest hemoglobin was 9.4 on 01/07/2020. Patient also with multiple dental caries underwent multiple extractions 12/30/2019. Patient was later cleared for surgical intervention and underwent C3-4, 4-5, 5-6, C6-7 anterior cervical decompression and fusion utilizing interbody cages 01/04/2020 per Dr. Annette Stable. Cervical brace as directed. Maintained on a regular diet. She currently remains on intravenous heparin for pulmonary emboli and transitioned to Eliquis 01/11/2020. Therapy evaluations completed and patient was admitted for a comprehensive rehab program. Patient transferred to CIR on 01/11/2020 .   Met with pt, pt's husband & daughter today to discuss TR services including activity analysis with potential modifications, stress management & relaxation training.  Pt presents with decreased activity tolerance, decreased functional mobility, decreased balance, decreased coordination Limiting pt's independence with leisure/community pursuits.  Pt reports she is coping well and optimistic about recovery.   Plan Min 1 TR session >20 minutes per week during LOS  Recommendations for other services: None   Discharge Criteria: Patient will be discharged from TR if patient refuses treatment 3 consecutive times without medical reason.  If treatment goals not met, if there is a change in medical status, if patient makes no progress towards goals or if patient is discharged from hospital.  The above assessment,  treatment plan, treatment alternatives and goals were discussed and mutually agreed upon: by patient  [place] 01/20/2020, 2:43 PM

## 2020-01-20 NOTE — Progress Notes (Signed)
Patient had dig stem and suppository with results. Patient wanted to eat her dinner before final dig stem completed.

## 2020-01-20 NOTE — Progress Notes (Signed)
Physical Therapy Weekly Progress Note  Patient Details  Name: Anna Campos MRN: [id number] Date of Birth: 04-08-1966  Beginning of progress report period: January 12, 2020 End of progress report period: January 20, 2020  Today's Date: 01/20/2020 PT Individual Time: 1100-1200 PT Individual Time Calculation (min): 60 min   Patient has met 3 of 3 short term goals.  Pt is at min A level for bed mobility, mod A overall for transfers, and requires assist x 2 for gait x 105 ft with use of PFRW (2nd person with close w/c follow). Pt has made good progress over the past week and has increased her overall functional level. Pt has been limited the past few days due to orthostatic BP, being addressed via abdominal binder, TEDs, and medication.  Patient continues to demonstrate the following deficits muscle weakness, decreased cardiorespiratoy endurance, abnormal tone, unbalanced muscle activation, ataxia and decreased coordination and decreased sitting balance, decreased standing balance, decreased postural control and decreased balance strategies and therefore will continue to benefit from skilled PT intervention to increase functional independence with mobility.  Patient progressing toward long term goals..  Continue plan of care.  PT Short Term Goals Week 1:  PT Short Term Goal 1 (Week 1): Pt will complete least restrictive transfers with assist x 1 PT Short Term Goal 1 - Progress (Week 1): Met PT Short Term Goal 2 (Week 1): Pt will ambulate x 50 ft with LRAD PT Short Term Goal 2 - Progress (Week 1): Met PT Short Term Goal 3 (Week 1): Pt will maintain static standing balance with min A PT Short Term Goal 3 - Progress (Week 1): Met Week 2:  PT Short Term Goal 1 (Week 2): Pt will complete transfers with min A consistently PT Short Term Goal 2 (Week 2): Pt will ambulate x 100 ft with assist x 1 consistently PT Short Term Goal 3 (Week 2): Pt will maintain dynamic standing balance with min A  consistently  Skilled Therapeutic Interventions/Progress Updates:    Pt received seated in recliner in room, agreeable to PT session. No complaints of pain. While seated in recliner with knee-high TEDs and abdominal binder BP 100/75. Sit to stand with mod A. Attempt to obtain standing BP but pt reports "my ears are tuning out" and "things are getting dark" so returned to sitting where symptoms resolve. Seated BP 93/67 following standing. Assisted pt with donning thigh-high TEDs. Pt reports improvement in symptoms during standing this time but BP 61/48, returned to sitting. Deferred further standing activity this session due to ongoing orthostatic BP. Stand pivot transfer recliner to bed with mod A. Sit to supine with Supervision. Reviewed supine BLE strengthening therex: heel slides, hip abd, SAQ, bridges x 10 reps each. Performed R hip ext and knee flex stretch 3 x 60 sec each. Provided handout for patient for supine therex and stretches. RN in room to provide pt with medication for BP management. Pt requesting to use the bathroom, deferred ambulation into bathroom due to BP. Stand pivot transfer bed to [facility] with mod A. Pt is dependent for clothing management and setup A for pericare. Stand pivot transfer to recliner with mod A. Pt left seated in recliner in room with needs in reach, chair alarm in place at end of session.  Therapy Documentation Precautions:  Precautions Precautions: Fall, Cervical Precaution Comments: reviewed cervical precautions; orthostatic Required Braces or Orthoses: Cervical Brace Cervical Brace: Soft collar(collar can be removed for showering per Dr. Dagoberto Ligas) Restrictions Weight Bearing Restrictions: No Other  Position/Activity Restrictions: collar at all times per chart   Therapy/Group: Individual Therapy   Excell Seltzer, PT, DPT 01/20/2020, 12:11 PM

## 2020-01-21 ENCOUNTER — Inpatient Hospital Stay (HOSPITAL_COMMUNITY): Payer: Commercial Managed Care - PPO | Admitting: Physical Therapy

## 2020-01-21 ENCOUNTER — Inpatient Hospital Stay (HOSPITAL_COMMUNITY): Payer: Commercial Managed Care - PPO

## 2020-01-21 ENCOUNTER — Ambulatory Visit (HOSPITAL_COMMUNITY): Payer: Commercial Managed Care - PPO | Admitting: *Deleted

## 2020-01-21 MED ORDER — BISACODYL 10 MG RE SUPP
10.0000 mg | Freq: Every day | RECTAL | Status: DC | PRN
  Administered 2020-01-27: 10 mg via RECTAL
  Filled 2020-01-21: qty 1

## 2020-01-21 NOTE — Progress Notes (Signed)
Physical Therapy Session Note  Patient Details  Name: Anna Campos MRN: [id number] Date of Birth: 1965-12-16  Today's Date: 01/21/2020 PT Individual Time: 1315-1415 PT Individual Time Calculation (min): 60 min  PT Missed Time: 15 min Missed Time Reason: pain  Short Term Goals: Week 2:  PT Short Term Goal 1 (Week 2): Pt will complete transfers with min A consistently PT Short Term Goal 2 (Week 2): Pt will ambulate x 100 ft with assist x 1 consistently PT Short Term Goal 3 (Week 2): Pt will maintain dynamic standing balance with min A consistently  Skilled Therapeutic Interventions/Progress Updates:    Pt received semi-reclined in bed, requests more time to rest due to L upper trap pain, pt using hot pack for pain relief. This therapist returns after 15 min and pt agreeable to participate in therapy session. Pt reports improvement in pain following rest break and use of hot pack. Semi-reclined BP 92/62 with use of abdominal binder, thigh-high TEDs, and ACE wraps. Semi-reclined to sitting EOB at Supervision level. Seated BP initially 68/60 but pt remains asymptomatic. After sitting for several minutes BP 86/68. Stand pivot transfer bed to w/c with min A. SPT to Nustep with min A. Nustep level 6 x 10 min with use of BLE only for global strengthening. Seated BP following Nustep 95/70. Seated balance and focus on [facility] with BUE grasping Connect 4 giant game pieces and reaching outside BOS and across midline with CGA for sitting balance. Seated BP following Connect 4 110/76. Pt asking to ambulate a short distance due to BP elevation this PM. Sit to stand with min A to PFRW, ambulation from doorway of room to bed with min A and PFRW, asymptomatic. Sit to supine Supervision. Pt left semi-reclined in bed with needs in reach, hot pack to L upper trap at end of session.  Therapy Documentation Precautions:  Precautions Precautions: Fall, Cervical Precaution Comments: reviewed cervical precautions;  orthostatic Required Braces or Orthoses: Cervical Brace Cervical Brace: Soft collar(collar can be removed for showering per Dr. Berline Chough) Restrictions Weight Bearing Restrictions: No Other Position/Activity Restrictions: collar at all times per chart    Therapy/Group: Individual Therapy   Peter Congo, PT, DPT  01/21/2020, 3:28 PM

## 2020-01-21 NOTE — Progress Notes (Signed)
[place] PHYSICAL MEDICINE & REHABILITATION PROGRESS NOTE   Subjective/Complaints:  Thinks florinef made her feel like her BP was low- clammy and dizzy- explained it's due to low BP, not medicine- she's willing to keep taking it for a few days, since takes a few days tokick in.   Wearing abd binder- better if under clothes- not painful then.    Pt denies SOB, abd pain, CP, N/V/C/D, and vision changes   Objective:   No results found. Recent Labs    01/19/20 0439  WBC 7.5  HGB 11.1*  HCT 35.6*  PLT 520*   Recent Labs    01/19/20 0439  NA 136  K 4.6  CL 100  CO2 27  GLUCOSE 89  BUN 19  CREATININE 0.80  CALCIUM 9.9    Intake/Output Summary (Last 24 hours) at 01/21/2020 1100 Last data filed at 01/20/2020 1900 Gross per 24 hour  Intake 240 ml  Output --  Net 240 ml     Physical Exam: Vital Signs Blood pressure 109/76, pulse 85, temperature 98.3 F (36.8 C), temperature source Oral, resp. rate 18, height 5\' 9"  (1.753 m), weight 53.8 kg, SpO2 100 %.  Gen: awake, sitting up in bedside chair, NAD HENT: wearing soft collar- incision looks great; missing all teeth CV: RRR-  Pulm: CTA B/L- good air movement GI: soft, NT, ND, (+) BS hypoactive Skin: Warm and dry.  Intact. Psych: smiling Musc: No edema in extremities.  No tenderness in extremities. Neuro: Ox3- no clonus, no hoffman's no increased tone  Motor: RUE: Shoulder abduction 2/5, elbow flexion/extension 3/5, handgrip 2/5, unchanged LUE: Shoulder abduction 2+/5 proximal distal, elbow flexion/extension 3/5, and grip 3 -/5 Left lower extremity: 4/5 proximal distal Right lower extremity: Hip flexion, knee extension 3 +/5, ankle dorsiflexion 2/5  Assessment/Plan: 1. Functional deficits secondary to C5 ASIA D due to osteomyelitis which require 3+ hours per day of interdisciplinary therapy in a comprehensive inpatient rehab setting.  Physiatrist is providing close team supervision and 24 hour management of active  medical problems listed below.  Physiatrist and rehab team continue to assess barriers to discharge/monitor patient progress toward functional and medical goals  Care Tool:  Bathing    Body parts bathed by patient: Chest, Abdomen, Face   Body parts bathed by helper: Right arm, Left arm     Bathing assist Assist Level: Minimal Assistance - Patient > 75%     Upper Body Dressing/Undressing Upper body dressing   What is the patient wearing?: Pull over shirt    Upper body assist Assist Level: Moderate Assistance - Patient 50 - 74%    Lower Body Dressing/Undressing Lower body dressing      What is the patient wearing?: Pants, Underwear/pull up     Lower body assist Assist for lower body dressing: Maximal Assistance - Patient 25 - 49%     Toileting Toileting    Toileting assist Assist for toileting: 2 Helpers     Transfers Chair/bed transfer  Transfers assist     Chair/bed transfer assist level: Moderate Assistance - Patient 50 - 74%     Locomotion Ambulation   Ambulation assist      Assist level: 2 helpers Assistive device: Walker-platform Max distance: 100'   Walk 10 feet activity   Assist     Assist level: 2 helpers Assistive device: Walker-platform   Walk 50 feet activity   Assist Walk 50 feet with 2 turns activity did not occur: Safety/medical concerns  Assist level: 2 helpers  Assistive device: Walker-platform    Walk 150 feet activity   Assist Walk 150 feet activity did not occur: Safety/medical concerns         Walk 10 feet on uneven surface  activity   Assist Walk 10 feet on uneven surfaces activity did not occur: Safety/medical concerns         Wheelchair     Assist Will patient use wheelchair at discharge?: Yes Type of Wheelchair: Manual    Wheelchair assist level: Supervision/Verbal cueing Max wheelchair distance: 58'    Wheelchair 50 feet with 2 turns activity    Assist        Assist Level:  Dependent - Patient 0%   Wheelchair 150 feet activity     Assist      Assist Level: Dependent - Patient 0%   Blood pressure 109/76, pulse 85, temperature 98.3 F (36.8 C), temperature source Oral, resp. rate 18, height 5\' 9"  (1.753 m), weight 53.8 kg, SpO2 100 %.  Medical Problem List and Plan: 1.  Quadriplegia secondary to C5 incomplete ASIA C initially, now D spinal cord injury.S/P C3-4 4-55-6 and C6-7 anterior cervical decompression and fusion 01/04/2020.  Cervical brace as directed  Continue CIR  2.  Antithrombotics/pulmonary emboli per CT angiogram of chest 12/20/2019: Lower extremity Doppler negative.   -DVT/anticoagulation: Intravenous heparin transitioned to Eliquis 01/11/2020             -antiplatelet therapy: N/A 3. Pain Management: Oxycodone and Robaxin as needed  3/30- pain controlled- con't meds prn 4. Mood: Provide emotional support             -antipsychotic agents: N/A 5. Neuropsych: This patient is capable of making decisions on her own behalf. 6. Skin/Wound Care: Routine skin checks 7. Fluids/Electrolytes/Nutrition: Routine in and outs 8.  Anemia.  EGD completed 12/27/2019 per GI services.  Recommend PPI x2 months.  H. pylori serology positive.  Complete course of amoxicillin and Biaxin  Hemoglobin 11.0 on 3/24   4/1- pt wants iron- will start Ferrous gluconate  4/2- tolerating iron so far- had bowel program last night for some reason will verify with nursing.   Continue to monitor 9. Multiple dental caries.  Follow-up dental services after multiple extractions 12/30/2019 10. Neurogenic bowel due to SCI.    Continue bowel program  3/29 - will change to QOD per pt's wishes/bowel habits  3/30- pt now able to control BMs so will stop bowel program  4/2- bowel program last night- will make sure stopped 11 Streptococcal bacteremia.  Intravenous ceftriaxone completed. 12.  History of tobacco use.  Counseling 13. Spasticity- improved since surgery- for now, doesn't need  spasticity agents-  14. Neurogenic bladder/frequency  Pt was started on Flomax- working well- no caths required in last 24 hours- will monitor.   PVRs with low volumes  Urinary frequency improving, cont Enablex 7.5 on 3/27  3/30- voiding well- PVRs look great- will stop PVRs after makes sure not retaining with cessation of flomax- con't Enablex- for urgency- will stop Flomax and make sure she's not retaining.   4/2- not retaining- stopped PVRs 15.  Hyponatremia  Sodium 133 on 3/24, labs ordered for tomorrow 16.  Transaminitis  LFTs elevated on 3/24, labs ordered for tomorrow  3/29- LFTs increase has resolved 17. Orthostatic hypotension  4/1- will add TEDS, abd binder and Florinef 0.1 mg daily since was so low and still low this AM  4/2- advised pt needs to continue Florinef- Sx's due to low BP, NOT  medicine- clammy/dizzy- will con't  LOS: 10 days A FACE TO FACE EVALUATION WAS PERFORMED  Anna Campos 01/21/2020, 11:00 AM

## 2020-01-21 NOTE — Progress Notes (Signed)
Occupational Therapy Session Note  Patient Details  Name: Anna Campos MRN: [id number] Date of Birth: [birth year]  Today's Date: 01/21/2020 OT Individual Time: 0900-1000 OT Individual Time Calculation (min): 60 min    Short Term Goals: Week 2:  OT Short Term Goal 1 (Week 2): Pt will complete UB bathing with min assist for 2 consecutive sessions with use of a washmit PRN. OT Short Term Goal 2 (Week 2): Pt will complete UB dressing with min assist for pullover shirt only. OT Short Term Goal 3 (Week 2): Pt will complete UB dressing with min assist for pullover shirt only. OT Short Term Goal 4 (Week 2): Pt/family will return demonstrate safe completion of AAROM exercises for BUE following handout. OT Short Term Goal 5 (Week 2): Pt/family will return demonstrate safe completion of AAROM exercises for BUE following handout.  Skilled Therapeutic Interventions/Progress Updates:  Pt received seated in recliner agreeable to session. Pt with ABD donned reporting feeling" fine." Donned TED hose with total A. Pt BP sitting in recliner with TEDs and ABD binder 93/67; MIN A sit<>stand BP 76/55 however pt asymptomatic. Donned bil ace wraps with pt still orthostatic however asymptomatic, noted BP to increase with pt standing ~ 3 mins. Sitting BP 91/64, standing 77/56, standing after 2 mins 98/76. Stand pivot transfer from recliner >w/c with MOD A. Pt completed seated UB grooming tasks with set- up assist. Pt transferred back to recliner in similar fashion as previously indicated. Pt left seated in recliner with all needs within reach and chair alarm activated.   Therapy Documentation Precautions:  Precautions Precautions: Fall, Cervical Precaution Comments: reviewed cervical precautions; orthostatic Required Braces or Orthoses: Cervical Brace Cervical Brace: Soft collar(collar can be removed for showering per Dr. Berline Chough) Restrictions Weight Bearing Restrictions: No Other Position/Activity Restrictions:  collar at all times per chart General:   Vital Signs: Therapy Vitals Pulse Rate: 85 BP: 109/76 Patient Position (if appropriate): Sitting Pain: Pt reports no pain during session.   Therapy/Group: Individual Therapy  Angelina Pih 01/21/2020, 12:14 PM

## 2020-01-21 NOTE — Progress Notes (Signed)
Late entry for 01/20/2020. Dox. Supp. Given on 04/01 at 1800 as ordered, but bowel program is discontinued. Patient noted to have stool (hard ball like stool in rectal vault) Patient stated that she hard a BM that was hard ball like and then later had another stool and was feeling much better.

## 2020-01-21 NOTE — Progress Notes (Signed)
Occupational Therapy Session Note  Patient Details  Name: Anna Campos MRN: [id number] Date of Birth: 02-Oct-1966  Today's Date: 01/21/2020 OT Individual Time: 1100-1200 OT Individual Time Calculation (min): 60 min    Short Term Goals: Week 2:  OT Short Term Goal 1 (Week 2): Pt will complete UB bathing with min assist for 2 consecutive sessions with use of a washmit PRN. OT Short Term Goal 2 (Week 2): Pt will complete UB dressing with min assist for pullover shirt only. OT Short Term Goal 3 (Week 2): Pt will complete UB dressing with min assist for pullover shirt only. OT Short Term Goal 4 (Week 2): Pt/family will return demonstrate safe completion of AAROM exercises for BUE following handout. OT Short Term Goal 5 (Week 2): Pt/family will return demonstrate safe completion of AAROM exercises for BUE following handout.  Skilled Therapeutic Interventions/Progress Updates:  Pt received seated in recliner reporting needing to use bathroom; MOD A for stand pivot to [facility] and total A for clothing mgmt and hygiene. Pt continues to be orthostatic however aymptomatic despite ABD binder, thigh high TED hose and bil ACE wraps. ( see vitals below). Transported pt to day room with total A to engage in seated therapeutic activity via Wii to increased Bil FMC and ROM. Pt required assist to manipulate buttons on controller however able to engage in both Wii bowling and Just dance. Pts morale seemed to have increased with activity as pt appears to discouraged by BP issues. Pt completed short distance functional mobility back to recliner with MIN A for ambulation with PFRW. Pt left seated in recliner with all needs within reach. BP at end of session 109/79.   Therapy Documentation Precautions:  Precautions Precautions: Fall, Cervical Precaution Comments: reviewed cervical precautions; orthostatic Required Braces or Orthoses: Cervical Brace Cervical Brace: Soft collar(collar can be removed for showering per Dr.  Berline Chough) Restrictions Weight Bearing Restrictions: No Other Position/Activity Restrictions: collar at all times per chart General:   Vital Signs: Sitting: 108/74 Standing: 88/54 Standing ~ 2 mins: 76/62 Sitting at end of session: 109/79 Pain: Pt reports no pain during session.   Therapy/Group: Individual Therapy  Angelina Pih 01/21/2020, 12:25 PM

## 2020-01-22 ENCOUNTER — Inpatient Hospital Stay (HOSPITAL_COMMUNITY): Payer: Commercial Managed Care - PPO | Admitting: Physical Therapy

## 2020-01-22 NOTE — Progress Notes (Signed)
[facility] PHYSICAL MEDICINE & REHABILITATION PROGRESS NOTE   Subjective/Complaints:  Up eating breakfast. Denies any problems. No pain. Slept well. Asked how long iron would take to boost hgb  ROS: Patient denies fever, rash, sore throat, blurred vision, nausea, vomiting, diarrhea, cough, shortness of breath or chest pain, joint or back pain, headache, or mood change.    Objective:   No results found. No results for input(s): WBC, HGB, HCT, PLT in the last 72 hours. No results for input(s): NA, K, CL, CO2, GLUCOSE, BUN, CREATININE, CALCIUM in the last 72 hours.  Intake/Output Summary (Last 24 hours) at 01/22/2020 1211 Last data filed at 01/22/2020 0825 Gross per 24 hour  Intake 720 ml  Output --  Net 720 ml     Physical Exam: Vital Signs Blood pressure 112/72, pulse 67, temperature 98.1 F (36.7 C), temperature source Oral, resp. rate 17, height 5\' 9"  (1.753 m), weight 53.8 kg, SpO2 100 %.  Constitutional: No distress . Vital signs reviewed. HEENT: EOMI, oral membranes moist Neck: supple Cardiovascular: RRR without murmur. No JVD    Respiratory/Chest: CTA Bilaterally without wheezes or rales. Normal effort    GI/Abdomen: BS +, non-tender, non-distended Ext: no clubbing, cyanosis, or edema Psych: pleasant and cooperative Musc: No edema in extremities.  No tenderness in extremities. Neuro: Ox3- no clonus, no hoffman's no increased tone  Motor: RUE: Shoulder abduction 2/5, elbow flexion/extension 3/5, handgrip 2/5,   LUE: Shoulder abduction 2+ to 3-/5 proximal distal, elbow flexion/extension 3/5, and grip 3-/5 Left lower extremity: 4/5 proximal distal Right lower extremity: Hip flexion, knee extension 3 to 3+/5, ankle dorsiflexion 3/5  Assessment/Plan: 1. Functional deficits secondary to C5 ASIA D due to osteomyelitis which require 3+ hours per day of interdisciplinary therapy in a comprehensive inpatient rehab setting.  Physiatrist is providing close team supervision and 24  hour management of active medical problems listed below.  Physiatrist and rehab team continue to assess barriers to discharge/monitor patient progress toward functional and medical goals  Care Tool:  Bathing    Body parts bathed by patient: Face   Body parts bathed by helper: Right arm, Left arm     Bathing assist Assist Level: Set up assist     Upper Body Dressing/Undressing Upper body dressing   What is the patient wearing?: Pull over shirt    Upper body assist Assist Level: Moderate Assistance - Patient 50 - 74%    Lower Body Dressing/Undressing Lower body dressing      What is the patient wearing?: Pants, Underwear/pull up     Lower body assist Assist for lower body dressing: Maximal Assistance - Patient 25 - 49%     Toileting Toileting    Toileting assist Assist for toileting: Total Assistance - Patient < 25%     Transfers Chair/bed transfer  Transfers assist     Chair/bed transfer assist level: Minimal Assistance - Patient > 75%     Locomotion Ambulation   Ambulation assist      Assist level: Minimal Assistance - Patient > 75% Assistive device: Walker-platform Max distance: 25'   Walk 10 feet activity   Assist     Assist level: Minimal Assistance - Patient > 75% Assistive device: Walker-platform   Walk 50 feet activity   Assist Walk 50 feet with 2 turns activity did not occur: Safety/medical concerns  Assist level: 2 helpers Assistive device: Walker-platform    Walk 150 feet activity   Assist Walk 150 feet activity did not occur: Safety/medical concerns  Walk 10 feet on uneven surface  activity   Assist Walk 10 feet on uneven surfaces activity did not occur: Safety/medical concerns         Wheelchair     Assist Will patient use wheelchair at discharge?: Yes Type of Wheelchair: Manual    Wheelchair assist level: Supervision/Verbal cueing Max wheelchair distance: 66'    Wheelchair 50 feet with 2  turns activity    Assist        Assist Level: Dependent - Patient 0%   Wheelchair 150 feet activity     Assist      Assist Level: Dependent - Patient 0%   Blood pressure 112/72, pulse 67, temperature 98.1 F (36.7 C), temperature source Oral, resp. rate 17, height 5\' 9"  (1.753 m), weight 53.8 kg, SpO2 100 %.  Medical Problem List and Plan: 1.  Quadriplegia secondary to C5 incomplete ASIA C initially, now D spinal cord injury.S/P C3-4 4-55-6 and C6-7 anterior cervical decompression and fusion 01/04/2020.  Cervical brace as directed  Continue CIR  2.  Antithrombotics/pulmonary emboli per CT angiogram of chest 12/20/2019: Lower extremity Doppler negative.   -DVT/anticoagulation: Intravenous heparin transitioned to Eliquis 01/11/2020             -antiplatelet therapy: N/A 3. Pain Management: Oxycodone and Robaxin as needed  4/3 pain seems controlled 4. Mood: Provide emotional support             -antipsychotic agents: N/A 5. Neuropsych: This patient is capable of making decisions on her own behalf. 6. Skin/Wound Care: Routine skin checks 7. Fluids/Electrolytes/Nutrition: Routine in and outs 8.  Anemia.  EGD completed 12/27/2019 per GI services.  Recommend PPI x2 months.  H. pylori serology positive.  Complete course of amoxicillin and Biaxin  Hemoglobin 11.0 on 3/24   4/1- pt wants iron- will start Ferrous gluconate  4/2- tolerating iron so far- had bowel program last night for some reason will verify with nursing.   Continue to monitor 9. Multiple dental caries.  Follow-up dental services after multiple extractions 12/30/2019 10. Neurogenic bowel due to SCI.    Continue bowel program  3/29 - will change to QOD per pt's wishes/bowel habits  3/30- pt now able to control BMs so will stop bowel program  4/3 no bm since 3/31---augment regimen if no movement today 11 Streptococcal bacteremia.  Intravenous ceftriaxone completed. 12.  History of tobacco use.  Counseling 13.  Spasticity- improved since surgery- for now, doesn't need spasticity agents-  14. Neurogenic bladder/frequency  Pt was started on Flomax- working well- no caths required in last 24 hours- will monitor.   PVRs with low volumes  Urinary frequency improving, cont Enablex 7.5 on 3/27  3/30- voiding well- PVRs look great- will stop PVRs after makes sure not retaining with cessation of flomax- con't Enablex- for urgency- will stop Flomax and make sure she's not retaining.   4/2- not retaining- stopped PVRs 15.  Hyponatremia  Sodium 133 on 3/24  16.  Transaminitis  LFTs elevated on 3/24   3/29- LFTs increase has resolved 17. Orthostatic hypotension  4/1- will add TEDS, abd binder and Florinef 0.1 mg daily since was so low and still low this AM  4/2- advised pt needs to continue Florinef- Sx's due to low BP, NOT medicine- clammy/dizzy- will con't  4/3 BP's look ok so far today---monitor with activity  LOS: 11 days A FACE TO FACE EVALUATION WAS PERFORMED  6/3 01/22/2020, 12:11 PM

## 2020-01-22 NOTE — Progress Notes (Addendum)
Physical Therapy Session Note  Patient Details  Name: Anna Campos MRN: [id number] Date of Birth: 04-08-1966  Today's Date: 01/22/2020 PT Individual Time: 0900-1008 PT Individual Time Calculation (min): 68 min   Short Term Goals: Week 1:  PT Short Term Goal 1 (Week 1): Pt will complete least restrictive transfers with assist x 1 PT Short Term Goal 1 - Progress (Week 1): Met PT Short Term Goal 2 (Week 1): Pt will ambulate x 50 ft with LRAD PT Short Term Goal 2 - Progress (Week 1): Met PT Short Term Goal 3 (Week 1): Pt will maintain static standing balance with min A PT Short Term Goal 3 - Progress (Week 1): Met Week 2:  PT Short Term Goal 1 (Week 2): Pt will complete transfers with min A consistently PT Short Term Goal 2 (Week 2): Pt will ambulate x 100 ft with assist x 1 consistently PT Short Term Goal 3 (Week 2): Pt will maintain dynamic standing balance with min A consistently  Skilled Therapeutic Interventions/Progress Updates:   Pt received sitting in WC and agreeable to PT. Pt reports feeling mild symptomatic for orthostatic BP. PT applied thigh high Teds and Ace wrap. Orthostatic vital signs assessed.  Sitting 98/67, 75bpm   Standing 64/45, 98bpm severe s/s.  Returned to sitting, no relief in s/s and BLE raised and reassessed. 94/68, 79bpm with reduced s/s.  PT applied abdominal binder.  Orthostatics reassessed.  Sitting 104/75. 80bpm.  Standing 76/54. 95bpm no s/s.  Standing 2 min 90/56, 91bpm. No s/s.   PT instructed pt in gait training with RW 2 x 177f with CGA and PFRW. Min cues for safety in turns and improved heel contact in BLE, with improved symmetry of gait pattern with increased distance. Very slow gait speed.   Dynamic balance training with wii fit. Table tilt x 2 rounds(4 levels and 5 level respectively  with min assist and min cues for use of ankle strategy to control COM. No LOB noted. Moderate cues for step to gait pattern with ascent and descent on/off balance  board.   Pt returned to room and performed Ambulatory transfer to bed with PFRW and CGA. Sit>supine completed with supervision assist, and left supine in bed with call bell in reach and all needs met.        Therapy Documentation Precautions:  Precautions Precautions: Fall, Cervical Precaution Comments: reviewed cervical precautions; orthostatic Required Braces or Orthoses: Cervical Brace Cervical Brace: Soft collar(collar can be removed for showering per Dr. LDagoberto Ligas Restrictions Weight Bearing Restrictions: No Other Position/Activity Restrictions: collar at all times per chart    Vital Signs:    See above Pain:   denies    Therapy/Group: Individual Therapy  ALorie Phenix4/12/2019, 10:11 AM

## 2020-01-23 ENCOUNTER — Inpatient Hospital Stay (HOSPITAL_COMMUNITY): Payer: Commercial Managed Care - PPO

## 2020-01-23 ENCOUNTER — Inpatient Hospital Stay (HOSPITAL_COMMUNITY): Payer: Commercial Managed Care - PPO | Admitting: Physical Therapy

## 2020-01-23 NOTE — Progress Notes (Signed)
[facility] PHYSICAL MEDICINE & REHABILITATION PROGRESS NOTE   Subjective/Complaints:  Up in chair. Just finished breakfast. Asked if her dc date could be moved up from 4/17  ROS: Patient denies fever, rash, sore throat, blurred vision, nausea, vomiting, diarrhea, cough, shortness of breath or chest pain, joint or back pain, headache, or mood change.   Objective:   No results found. No results for input(s): WBC, HGB, HCT, PLT in the last 72 hours. No results for input(s): NA, K, CL, CO2, GLUCOSE, BUN, CREATININE, CALCIUM in the last 72 hours.  Intake/Output Summary (Last 24 hours) at 01/23/2020 1028 Last data filed at 01/22/2020 1300 Gross per 24 hour  Intake 240 ml  Output --  Net 240 ml     Physical Exam: Vital Signs Blood pressure 102/66, pulse 75, temperature 97.7 F (36.5 C), resp. rate 16, height 5\' 9"  (1.753 m), weight 53.8 kg, SpO2 100 %.  Constitutional: No distress . Vital signs reviewed. HEENT: EOMI, oral membranes moist. edentulous Neck: supple Cardiovascular: RRR without murmur. No JVD    Respiratory/Chest: CTA Bilaterally without wheezes or rales. Normal effort    GI/Abdomen: BS +, non-tender, non-distended Ext: no clubbing, cyanosis, or edema Psych: pleasant and cooperative Musc: No edema in extremities.  No tenderness in extremities. Neuro: Ox3- no clonus, no hoffman's no increased tone  Motor: RUE: Shoulder abduction 2/5, elbow flexion/extension 3/5, handgrip 2/5,   LUE: Shoulder abduction 2+ to 3-/5 proximal distal, elbow flexion/extension 3/5, and grip 3-/5 Left lower extremity: 4/5 proximal distal Right lower extremity: Hip flexion, knee extension 3 to 3+/5, ankle dorsiflexion 3/5  Assessment/Plan: 1. Functional deficits secondary to C5 ASIA D due to osteomyelitis which require 3+ hours per day of interdisciplinary therapy in a comprehensive inpatient rehab setting.  Physiatrist is providing close team supervision and 24 hour management of active medical  problems listed below.  Physiatrist and rehab team continue to assess barriers to discharge/monitor patient progress toward functional and medical goals  Care Tool:  Bathing    Body parts bathed by patient: Face   Body parts bathed by helper: Right arm, Left arm     Bathing assist Assist Level: Set up assist     Upper Body Dressing/Undressing Upper body dressing   What is the patient wearing?: Pull over shirt    Upper body assist Assist Level: Moderate Assistance - Patient 50 - 74%    Lower Body Dressing/Undressing Lower body dressing      What is the patient wearing?: Pants, Underwear/pull up     Lower body assist Assist for lower body dressing: Maximal Assistance - Patient 25 - 49%     Toileting Toileting    Toileting assist Assist for toileting: Total Assistance - Patient < 25%     Transfers Chair/bed transfer  Transfers assist     Chair/bed transfer assist level: Minimal Assistance - Patient > 75%     Locomotion Ambulation   Ambulation assist      Assist level: Minimal Assistance - Patient > 75% Assistive device: Walker-platform Max distance: 25'   Walk 10 feet activity   Assist     Assist level: Minimal Assistance - Patient > 75% Assistive device: Walker-platform   Walk 50 feet activity   Assist Walk 50 feet with 2 turns activity did not occur: Safety/medical concerns  Assist level: 2 helpers Assistive device: Walker-platform    Walk 150 feet activity   Assist Walk 150 feet activity did not occur: Safety/medical concerns  Walk 10 feet on uneven surface  activity   Assist Walk 10 feet on uneven surfaces activity did not occur: Safety/medical concerns         Wheelchair     Assist Will patient use wheelchair at discharge?: Yes Type of Wheelchair: Manual    Wheelchair assist level: Supervision/Verbal cueing Max wheelchair distance: 38'    Wheelchair 50 feet with 2 turns activity    Assist         Assist Level: Dependent - Patient 0%   Wheelchair 150 feet activity     Assist      Assist Level: Dependent - Patient 0%   Blood pressure 102/66, pulse 75, temperature 97.7 F (36.5 C), resp. rate 16, height 5\' 9"  (1.753 m), weight 53.8 kg, SpO2 100 %.  Medical Problem List and Plan: 1.  Quadriplegia secondary to C5 incomplete ASIA C initially, now D spinal cord injury.S/P C3-4 4-55-6 and C6-7 anterior cervical decompression and fusion 01/04/2020.  Cervical brace as directed  Continue CIR   -advised her to discuss dc date with her primary team this week 2.  Antithrombotics/pulmonary emboli per CT angiogram of chest 12/20/2019: Lower extremity Doppler negative.   -DVT/anticoagulation: Intravenous heparin transitioned to Eliquis 01/11/2020             -antiplatelet therapy: N/A 3. Pain Management: Oxycodone and Robaxin as needed  4/4 pain seems controlled 4. Mood: Provide emotional support             -antipsychotic agents: N/A 5. Neuropsych: This patient is capable of making decisions on her own behalf. 6. Skin/Wound Care: Routine skin checks 7. Fluids/Electrolytes/Nutrition: Routine in and outs 8.  Anemia.  EGD completed 12/27/2019 per GI services.  Recommend PPI x2 months.  H. pylori serology positive.  Complete course of amoxicillin and Biaxin  Hemoglobin 11.0 on 3/24   4/1- pt wants iron- will start Ferrous gluconate  4/2- tolerating iron so far- had bowel program last night for some reason will verify with nursing.   Continue to monitor 9. Multiple dental caries.  Follow-up dental services after multiple extractions 12/30/2019 10. Neurogenic bowel due to SCI.    Continue bowel program  3/29 - will change to QOD per pt's wishes/bowel habits  3/30- pt now able to control BMs so will stop bowel program  Had a bm 4/3 11 Streptococcal bacteremia.  Intravenous ceftriaxone completed. 12.  History of tobacco use.  Counseling 13. Spasticity- improved since surgery- for now,  doesn't need spasticity agents-  14. Neurogenic bladder/frequency  Pt was started on Flomax- working well- no caths required in last 24 hours- will monitor.   PVRs with low volumes  Urinary frequency improving, cont Enablex 7.5 on 3/27  3/30- voiding well- PVRs look great- will stop PVRs after makes sure not retaining with cessation of flomax- con't Enablex- for urgency- will stop Flomax and make sure she's not retaining.   4/2- not retaining- stopped PVRs 15.  Hyponatremia  Sodium 133 on 3/24  16.  Transaminitis  LFTs elevated on 3/24   3/29- LFTs increase has resolved 17. Orthostatic hypotension  4/1- will add TEDS, abd binder and Florinef 0.1 mg daily since was so low and still low this AM  4/2- advised pt needs to continue Florinef- Sx's due to low BP, NOT medicine- clammy/dizzy- will con't  4/4 BP's look good again today  LOS: 12 days A FACE TO FACE EVALUATION WAS PERFORMED  6/4 01/23/2020, 10:28 AM

## 2020-01-23 NOTE — Progress Notes (Signed)
Physical Therapy Session Note  Patient Details  Name: Anna Campos MRN: [id number] Date of Birth: 1965-11-18  Today's Date: 01/23/2020 PT Individual Time: 1415-1530 PT Individual Time Calculation (min): 75 min   Short Term Goals: Week 2:  PT Short Term Goal 1 (Week 2): Pt will complete transfers with min A consistently PT Short Term Goal 2 (Week 2): Pt will ambulate x 100 ft with assist x 1 consistently PT Short Term Goal 3 (Week 2): Pt will maintain dynamic standing balance with min A consistently  Skilled Therapeutic Interventions/Progress Updates:    Pt received seated in recliner in room, agreeable to PT session. No complaints of pain. Pt does have onset of L upper trap soreness at end of session, provided hot pack for pain relief. Sit to stand with min A throughout session. Ambulation 2 x 150 ft with use of PFRW at CGA to min A level. Pt exhibits decreased in LE ataxia during gait with improved body control overall noted. Pt exhibits improved endurance for gait training this date as well. Seated BP following gait 100/60 with use of abdominal binder, thigh-high TEDs, and ACE wrap to BLE. Static standing balance on Wii Fit Plus with use of PFRW and min A for balance performing tilt table and bubble game: focus on multidirectional weight shift following visual cues on screen. Pt exhibits good tolerance for standing activities this date with standing BP of 96/71 following standing activities. Standing mini-squats 2 x 10 reps with focus on BLE strengthening and coordination, mod A for standing balance providing UE support to patient. Pt left seated in recliner in room with needs in reach, husband present at end of session.  Therapy Documentation Precautions:  Precautions Precautions: Fall, Cervical Precaution Comments: reviewed cervical precautions; orthostatic Required Braces or Orthoses: Cervical Brace Cervical Brace: Soft collar(collar can be removed for showering per Dr.  Berline Chough) Restrictions Weight Bearing Restrictions: No Other Position/Activity Restrictions: collar at all times per chart    Therapy/Group: Individual Therapy   Peter Congo, PT, DPT  01/23/2020, 3:45 PM

## 2020-01-23 NOTE — Progress Notes (Signed)
Occupational Therapy Session Note  Patient Details  Name: Anna Campos MRN: [id number] Date of Birth: [birth year]  Today's Date: 01/23/2020 OT Individual Time: 0930-1000 and 1300-1335 OT Individual Time Calculation (min): 30 min and 35 min    Short Term Goals: Week 1:  OT Short Term Goal 1 (Week 1): Pt will self feed using universal cuff or foam grips and the RUE for 75% of meal with no more than min assist. OT Short Term Goal 1 - Progress (Week 1): Met OT Short Term Goal 2 (Week 1): Pt will complete UB bathing with min assist for 2 consecutive sessions with use of a washmit PRN. OT Short Term Goal 2 - Progress (Week 1): Progressing toward goal OT Short Term Goal 3 (Week 1): Pt will perform toilet transfer stand pivot with mod assist to the 3:1. OT Short Term Goal 3 - Progress (Week 1): Met OT Short Term Goal 4 (Week 1): Pt will complete UB dressing with min assist for pullover shirt only. OT Short Term Goal 4 - Progress (Week 1): Progressing toward goal OT Short Term Goal 5 (Week 1): Pt/family will return demonstrate safe completion of AAROM exercises for BUE following handout. OT Short Term Goal 5 - Progress (Week 1): Progressing toward goal  Skilled Therapeutic Interventions/Progress Updates:    Session 1: OT session focused on functional transfers and increasing independence with grooming tasks. Pt received sitting in recliner and requesting to complete oral care. BP 92/68. OT donned abdominal binder then pt ambulated short distance to w/c using PFRW with min A. BP 77/61 with pt asymptomatic. Completed oral care while seated in w/c with overall min A. OT placed foam grip on toothbrush and positioned elbow on sink to promote coordination and independence. At end of session, pt completed stand pivot transfer w/c>bed with min A and sit>supine with supervision. BP 105/71. Pt left with bed alarm on and all needs in reach.   Session 2: OT session focused on functional transfers and fine motor  skills/bilateral coordination. Pt received in recliner chair with BP 102/68. Abdominal binder, TED hose, and ace wrap donned. Pt ambulated to w/c with min A using PFRW. Immediately following BP dropped to 77/68 with pt asymptomatic. Transitioned to gym via w/c and practiced stacking 5 cones with L hand and R hand with fair/+ skill noted. Increased challenging by interlocking 9 PVC pieces together with increased time using compensatory movements and moderate dropping. Pt highly motivated by task. At end of session, pt returned to recliner chair and left with chair alarm and all needs in reach.    Therapy Documentation Precautions:  Precautions Precautions: Fall, Cervical Precaution Comments: reviewed cervical precautions; orthostatic Required Braces or Orthoses: Cervical Brace Cervical Brace: Soft collar(collar can be removed for showering per Dr. Dagoberto Ligas) Restrictions Weight Bearing Restrictions: No Other Position/Activity Restrictions: collar at all times per chart General:   Vital Signs:   Pain: Pain Assessment Pain Score: 0-No pain ADL: ADL Equipment Provided: Feeding equipment Eating: Maximal assistance Where Assessed-Eating: Chair Grooming: Maximal assistance Where Assessed-Grooming: Wheelchair Upper Body Bathing: Maximal assistance Where Assessed-Upper Body Bathing: Wheelchair Lower Body Bathing: Dependent Where Assessed-Lower Body Bathing: Sitting at sink, Standing at sink, Wheelchair Upper Body Dressing: Maximal assistance Where Assessed-Upper Body Dressing: Wheelchair, Sitting at sink Lower Body Dressing: Dependent Where Assessed-Lower Body Dressing: Wheelchair Toileting: Dependent Where Assessed-Toileting: Bedside Commode Toilet Transfer: Dependent Armed forces technical officer Method: Arts development officer: Art gallery manager    Praxis   Exercises:  Other Treatments:     Therapy/Group: Individual Therapy  Duayne Cal 01/23/2020,  12:24 PM

## 2020-01-23 NOTE — Progress Notes (Signed)
Occupational Therapy Session Note  Patient Details  Name: Anna Campos MRN: [id number] Date of Birth: [birth year]  Today's Date: 01/23/2020 OT Individual Time: 1100-1155 OT Individual Time Calculation (min): 55 min    Short Term Goals: Week 2:  OT Short Term Goal 1 (Week 2): Pt will complete UB bathing with min assist for 2 consecutive sessions with use of a washmit PRN. OT Short Term Goal 2 (Week 2): Pt will complete UB dressing with min assist for pullover shirt only. OT Short Term Goal 3 (Week 2): Pt will complete UB dressing with min assist for pullover shirt only. OT Short Term Goal 4 (Week 2): Pt/family will return demonstrate safe completion of AAROM exercises for BUE following handout. OT Short Term Goal 5 (Week 2): Pt/family will return demonstrate safe completion of AAROM exercises for BUE following handout.  Skilled Therapeutic Interventions/Progress Updates:    Pt sitting EOB with NT present.  Thigh high Teds and abd binder were not donned.  BP sitting with no Teds 106/71; with Teds and Abd binder-98/76. Standing with teds and abd binder-62/48 with no s/s. 2nd attempt at standing with BO 74/55. Ace wraps applied to BLE: BP 75/54 and 79/59 after 2 mins with no s/s.  Pt amb with PFRW into hallway and turned around and returned to recliner.  No s/s. Sit<>stand X 5 from recliner. Pt remained in recliner with seat alarm activated and all needs within reach.   Therapy Documentation Precautions:  Precautions Precautions: Fall, Cervical Precaution Comments: reviewed cervical precautions; orthostatic Required Braces or Orthoses: Cervical Brace Cervical Brace: Soft collar(collar can be removed for showering per Dr. Berline Chough) Restrictions Weight Bearing Restrictions: No Other Position/Activity Restrictions: collar at all times per chart   Pain: Pain Assessment Pain Score: 0-No pain   Therapy/Group: Individual Therapy  [name] 01/23/2020, 12:06 PM

## 2020-01-24 ENCOUNTER — Inpatient Hospital Stay (HOSPITAL_COMMUNITY): Payer: Commercial Managed Care - PPO

## 2020-01-24 ENCOUNTER — Inpatient Hospital Stay (HOSPITAL_COMMUNITY): Payer: Commercial Managed Care - PPO | Admitting: Physical Therapy

## 2020-01-24 NOTE — Progress Notes (Signed)
Physical Therapy Session Note  Patient Details  Name: Anna Campos MRN: [id number] Date of Birth: 1966/02/05  Today's Date: 01/24/2020 PT Individual Time: 0800-0855 PT Individual Time Calculation (min): 55 min   Short Term Goals: Week 2:  PT Short Term Goal 1 (Week 2): Pt will complete transfers with min A consistently PT Short Term Goal 2 (Week 2): Pt will ambulate x 100 ft with assist x 1 consistently PT Short Term Goal 3 (Week 2): Pt will maintain dynamic standing balance with min A consistently  Skilled Therapeutic Interventions/Progress Updates:    Pt received seated in recliner in room finishing breakfast, agreeable to PT session. No complaints of pain. Pt wearing abdominal binder and thigh-high TEDs while seated in chair. Seated BP 79/52. Assisted pt with donning BLE ACE wraps dependently. Seated BP 89/61. Sit to stand with min A throughout session. Trial gait with use of RW vs PFRW. Pt exhibits improved grip for RW use (L>R) but doesn't exhibit sufficient UB strength in order to propel RW and fatigues quickly with use. Ambulation 2 x 150 ft with PFRW and CGA to min A. Adjusted platform height for improved body mechanics with use. Seated BP following gait 104/70 and pt with no symptoms of orthostasis throughout session. Pt left seated in recliner in room with needs in reach at end of session.  Therapy Documentation Precautions:  Precautions Precautions: Fall, Cervical Precaution Comments: reviewed cervical precautions; orthostatic Required Braces or Orthoses: Cervical Brace Cervical Brace: Soft collar(collar can be removed for showering per Dr. Berline Chough) Restrictions Weight Bearing Restrictions: No Other Position/Activity Restrictions: collar at all times per chart    Therapy/Group: Individual Therapy   Peter Congo, PT, DPT  01/24/2020, 8:59 AM

## 2020-01-24 NOTE — Progress Notes (Signed)
[place] PHYSICAL MEDICINE & REHABILITATION PROGRESS NOTE   Subjective/Complaints:  Pt just walked 150-200 ft with platform walker with PT- doing great- cannot walk with RW yet- needs platform per PT.   BP still a little low 90s/50s- but asymptomatic- still using abd binder and TEDs.   BM nightly x last 2 nights. On own.    ROS:  Pt denies SOB, abd pain, CP, N/V/C/D, and vision changes   Objective:   No results found. No results for input(s): WBC, HGB, HCT, PLT in the last 72 hours. No results for input(s): NA, K, CL, CO2, GLUCOSE, BUN, CREATININE, CALCIUM in the last 72 hours.  Intake/Output Summary (Last 24 hours) at 01/24/2020 0851 Last data filed at 01/23/2020 1800 Gross per 24 hour  Intake 680 ml  Output --  Net 680 ml     Physical Exam: Vital Signs Blood pressure 106/73, pulse 79, temperature 98.7 F (37.1 C), resp. rate 18, height 5\' 9"  (1.753 m), weight 53.8 kg, SpO2 98 %.  Constitutional: Nsitting up in bedside chair, PT at side, NAD HEENT: EOMI, oral membranes moist.missing all teeth Neck: supple Cardiovascular: RRR; no JVD  Respiratory/Chest: CTA B/L- good air movement   GI/Abdomen:soft, NT, ND, (+)BS Ext: no clubbing, cyanosis, or edema Psych: pleasant and cooperative Musc: No edema in extremities.  No tenderness in extremities. Neuro: Ox3 Motor: RUE: Shoulder abduction 2+/5, elbow flexion/extension 3+/5, handgrip 2/5,  Finger abd 1/5 LUE: Shoulder abduction 2+/5 , elbow flexion/extension 3/5, and grip 3-/5; finger abd 1/5 Left lower extremity: 4/5 proximal distal Right lower extremity: Hip flexion, knee extension 3 to 3+/5, ankle dorsiflexion 3/5  Assessment/Plan: 1. Functional deficits secondary to C5 ASIA D due to osteomyelitis which require 3+ hours per day of interdisciplinary therapy in a comprehensive inpatient rehab setting.  Physiatrist is providing close team supervision and 24 hour management of active medical problems listed  below.  Physiatrist and rehab team continue to assess barriers to discharge/monitor patient progress toward functional and medical goals  Care Tool:  Bathing    Body parts bathed by patient: Face   Body parts bathed by helper: Right arm, Left arm     Bathing assist Assist Level: Set up assist     Upper Body Dressing/Undressing Upper body dressing   What is the patient wearing?: Pull over shirt    Upper body assist Assist Level: Moderate Assistance - Patient 50 - 74%    Lower Body Dressing/Undressing Lower body dressing      What is the patient wearing?: Pants, Underwear/pull up     Lower body assist Assist for lower body dressing: Maximal Assistance - Patient 25 - 49%     Toileting Toileting    Toileting assist Assist for toileting: Total Assistance - Patient < 25%     Transfers Chair/bed transfer  Transfers assist     Chair/bed transfer assist level: Minimal Assistance - Patient > 75%     Locomotion Ambulation   Ambulation assist      Assist level: Minimal Assistance - Patient > 75% Assistive device: Walker-platform Max distance: 150'   Walk 10 feet activity   Assist     Assist level: Minimal Assistance - Patient > 75% Assistive device: Walker-platform   Walk 50 feet activity   Assist Walk 50 feet with 2 turns activity did not occur: Safety/medical concerns  Assist level: Minimal Assistance - Patient > 75% Assistive device: Walker-platform    Walk 150 feet activity   Assist Walk 150 feet activity  did not occur: Safety/medical concerns  Assist level: Minimal Assistance - Patient > 75% Assistive device: Walker-platform    Walk 10 feet on uneven surface  activity   Assist Walk 10 feet on uneven surfaces activity did not occur: Safety/medical concerns         Wheelchair     Assist Will patient use wheelchair at discharge?: Yes Type of Wheelchair: Manual    Wheelchair assist level: Supervision/Verbal cueing Max  wheelchair distance: 37'    Wheelchair 50 feet with 2 turns activity    Assist        Assist Level: Dependent - Patient 0%   Wheelchair 150 feet activity     Assist      Assist Level: Dependent - Patient 0%   Blood pressure 106/73, pulse 79, temperature 98.7 F (37.1 C), resp. rate 18, height 5\' 9"  (1.753 m), weight 53.8 kg, SpO2 98 %.  Medical Problem List and Plan: 1.  Quadriplegia secondary to C5 incomplete ASIA C initially, now D spinal cord injury.S/P C3-4 4-55-6 and C6-7 anterior cervical decompression and fusion 01/04/2020.  Cervical brace as directed  Continue CIR   -advised her to discuss dc date with her primary team this week  4/5- pt asking to leave early- after d/w pt risks/benefits of going/staying, she decided wanted to wait til 4/17 to leave- strength slightly improving.  2.  Antithrombotics/pulmonary emboli per CT angiogram of chest 12/20/2019: Lower extremity Doppler negative.   -DVT/anticoagulation: Intravenous heparin transitioned to Eliquis 01/11/2020             -antiplatelet therapy: N/A 3. Pain Management: Oxycodone and Robaxin as needed  4/4 pain seems controlled 4. Mood: Provide emotional support             -antipsychotic agents: N/A 5. Neuropsych: This patient is capable of making decisions on her own behalf. 6. Skin/Wound Care: Routine skin checks 7. Fluids/Electrolytes/Nutrition: Routine in and outs 8.  Anemia.  EGD completed 12/27/2019 per GI services.  Recommend PPI x2 months.  H. pylori serology positive.  Complete course of amoxicillin and Biaxin  Hemoglobin 11.0 on 3/24   4/1- pt wants iron- will start Ferrous gluconate  4/2- tolerating iron so far- had bowel program last night for some reason will verify with nursing.   4/5- off bowel program- going regularly.   Continue to monitor 9. Multiple dental caries.  Follow-up dental services after multiple extractions 12/30/2019 10. Neurogenic bowel due to SCI.    Continue bowel program  3/29  - will change to QOD per pt's wishes/bowel habits  3/30- pt now able to control BMs so will stop bowel program  Had a bm 4/3  4/5- as above 11 Streptococcal bacteremia.  Intravenous ceftriaxone completed. 12.  History of tobacco use.  Counseling 13. Spasticity- improved since surgery- for now, doesn't need spasticity agents-  14. Neurogenic bladder/frequency  Pt was started on Flomax- working well- no caths required in last 24 hours- will monitor.   PVRs with low volumes  Urinary frequency improving, cont Enablex 7.5 on 3/27  3/30- voiding well- PVRs look great- will stop PVRs after makes sure not retaining with cessation of flomax- con't Enablex- for urgency- will stop Flomax and make sure she's not retaining.   4/2- not retaining- stopped PVRs 15.  Hyponatremia  Sodium 133 on 3/24  16.  Transaminitis  LFTs elevated on 3/24   3/29- LFTs increase has resolved 17. Orthostatic hypotension  4/1- will add TEDS, abd binder and Florinef 0.1  mg daily since was so low and still low this AM  4/2- advised pt needs to continue Florinef- Sx's due to low BP, NOT medicine- clammy/dizzy- will con't  4/4 BP's look good again today  4/5- Having Bps of 90/50s with PT with standing- asymptomatic- will always be low, but if doesn't need more meds, won't increase.   LOS: 13 days A FACE TO FACE EVALUATION WAS PERFORMED  Chara Marquard 01/24/2020, 8:51 AM

## 2020-01-24 NOTE — Progress Notes (Addendum)
Occupational Therapy Weekly Progress Note  Patient Details  Name: Anna Campos MRN: [id number] Date of Birth: [birth year]  Beginning of progress report period: January 18, 2020 End of progress report period: January 25, 2020     Patient has met 2 of 3 short term goals. Pt continues to present with decreased bil Bishop ( LUE better than RUE), decreased dynamic standing balance, decreased strength and coordination limiting ability to participate in ADLs. Overall, pt requires MOD - MAX A for dressing, MINA- set- up for seated grooming tasks, and Mod- MAX A for bathing. Pt requires [place] for sit<>stand with PFRW and functional mobility. Pt continues to be motivated towards reaching OT goals and is encouraged by a supportive family. Pt continues to be affected by orthostatic hypotension however pt continues to be mostly asymptomatic. Pt currently wearing ABD binder, bilateral ace wraps and thigh high TED hose to accommodate for hypotension. Will continue to progress pt towards OT goals within pts POC.   Patient continues to demonstrate the following deficits: muscle weakness and muscle paralysis, decreased cardiorespiratoy endurance, unbalanced muscle activation and decreased coordination and decreased standing balance, decreased postural control, decreased balance strategies and orthostasis  and therefore will continue to benefit from skilled OT intervention to enhance overall performance with BADL and Reduce care partner burden.  Patient progressing toward long term goals.Marland KitchenLTG adjusted  Continue plan of care.  OT Short Term Goals Week 3:  OT Short Term Goal 1 (Week 3): Pt will complete UB dressing with min assist for pullover shirt only. OT Short Term Goal 2 (Week 3): Pt will complete LB dressing with MOD A for pants only OT Short Term Goal 3 (Week 3): Pt will perform seated grooming task with MIN A for more than 2 consecutive sessions  Ihor Gully 01/24/2020, 12:55 PM

## 2020-01-24 NOTE — Progress Notes (Signed)
Occupational Therapy Session Note  Patient Details  Name: Anna Campos MRN: [id number] Date of Birth: 1966-09-15  Today's Date: 01/24/2020 OT Individual Time: 0215-0330 OT Individual Time Calculation (min): 75 min    Short Term Goals: Week 2:  OT Short Term Goal 1 (Week 2): Pt will complete UB bathing with min assist for 2 consecutive sessions with use of a washmit PRN. OT Short Term Goal 1 - Progress (Week 2): Met OT Short Term Goal 2 (Week 2): Pt will complete UB dressing with min assist for pullover shirt only. OT Short Term Goal 2 - Progress (Week 2): Progressing toward goal OT Short Term Goal 3 (Week 2): Pt will complete UB dressing with min assist for pullover shirt only. OT Short Term Goal 4 (Week 2): Pt/family will return demonstrate safe completion of AAROM exercises for BUE following handout. OT Short Term Goal 4 - Progress (Week 2): Met OT Short Term Goal 5 (Week 2): Pt/family will return demonstrate safe completion of AAROM exercises for BUE following handout.  Skilled Therapeutic Interventions/Progress Updates:  Pt received seated in recliner agreeable to OT intervention with ABD binder and TED hose donned ( see vitals below). Pt completed functional mobility from recliner >w/c with PFRW and MIN A. Pt requested to go outside during session; total A to be transported outside in w/c. Pt completed BUE therex via ball passes and catch and release trials to facilitate BUE strength for functional mobility. Pt completed 4 trials of 3 mins each with 1 min of rest. Trialed use of 2 lb dowel for BUE strength with pt reporting too much weight therefore terminated task. Pt request to return inside d/t bees as pt reports being allergic. Pt transported back inside with total A where pt completed seated medication mgmt task to facilitate Bil FMC. Pt able to open all caps on weekly pill box with increased time and effort and requried MIN A and use of dycem to open various medicine bottles. Pt  required increased time and compensatory method of sliding pills into hand from table and dropping in pill box to complete task, however pt very motivated and appreciative of education. Pt transported back to room in w/c with total A where pt completed ~ 8 ft functional mobility with PFRW and MIN A for balance. Pt left seated in recliner with legs elevated, RN present and all needs within reach.   Therapy Documentation Precautions:  Precautions Precautions: Fall, Cervical Precaution Comments: reviewed cervical precautions; orthostatic Required Braces or Orthoses: Cervical Brace Cervical Brace: Soft collar(collar can be removed for showering per Dr. Dagoberto Ligas) Restrictions Weight Bearing Restrictions: No Other Position/Activity Restrictions: collar at all times per chart General:   Vital Signs: Seated at start of session:  92/68 HR 92 Seated at end of session: 94/79 HR 93 Pain: Pt reports no pain during session.   Therapy/Group: Individual Therapy  Ihor Gully 01/24/2020, 3:50 PM

## 2020-01-24 NOTE — Progress Notes (Signed)
Occupational Therapy Session Note  Patient Details  Name: Anna Campos MRN: [id number] Date of Birth: July 12, 1966  Today's Date: 01/24/2020 OT Individual Time: 6948-5462 OT Individual Time Calculation (min): 60 min    Short Term Goals: Week 2:  OT Short Term Goal 1 (Week 2): Pt will complete UB bathing with min assist for 2 consecutive sessions with use of a washmit PRN. OT Short Term Goal 2 (Week 2): Pt will complete UB dressing with min assist for pullover shirt only. OT Short Term Goal 3 (Week 2): Pt will complete UB dressing with min assist for pullover shirt only. OT Short Term Goal 4 (Week 2): Pt/family will return demonstrate safe completion of AAROM exercises for BUE following handout. OT Short Term Goal 5 (Week 2): Pt/family will return demonstrate safe completion of AAROM exercises for BUE following handout.  Skilled Therapeutic Interventions/Progress Updates:  Pt received seated in recliner agreeable to OT intervention with ABD binder, thigh high TED hose and bil ace wraps donned ( see vitals below). Pt continues to be orthostatic with positional changes however asymptomatic. Pt requested to wash up at sink although pt would prefer to shower. Education provided on continued hypotension and safety concerns with getting into shower without additional ABD for shower( followed with PA about obtaining ABD binder for showers); pt verbalized understanding. Pt completed functional mobility from recliner to w/c at sink with MIN A and PFRW. Pt required MIN A for UB bathing to ring out washcloth and assist with washing underarms. MOD A for UB dressing to don shirt over cervical collar and MAX A for LB Dressing. Pt reports family to hire neighbor to come over and assist pt with ADLs at home. Pt completed oral care seated at sink with MIN A to apply toothpaste. Education provided on using "pump" bottles for toothpaste and shampoo/ body wash at home to compensate for decreased grip strength; pt  verbalized understanding. Pt completed functional mobility back to recliner with PFRW and MIN A. Pt completed seated BUE therex to increased FMC. Pt completed x5 reps each of digit lifts, forearm pronation/ supination, wrist flexion/ extension, and digit ABD/ ADD. Pt completed functional grasp and release activity where pt instructed to reach across midline to retrieve various objects and place on opposite side of table. Noted improved grasp and release on RUE. Pt left seated in recliner with all needs within reach.   Therapy Documentation Precautions:  Precautions Precautions: Fall, Cervical Precaution Comments: reviewed cervical precautions; orthostatic Required Braces or Orthoses: Cervical Brace Cervical Brace: Soft collar(collar can be removed for showering per Dr. Berline Chough) Restrictions Weight Bearing Restrictions: No Other Position/Activity Restrictions: collar at all times per chart General:   Vital Signs: Seated with ABD binder, thigh high TEDs and bil ace wraps: 97/64 HR 88 Standing with  ABD binder, thigh high TEDs and bil ace wraps: 86/62 HR 89   Pain: Pt reports no pain during session.   Therapy/Group: Individual Therapy  Angelina Pih 01/24/2020, 11:59 AM

## 2020-01-25 ENCOUNTER — Inpatient Hospital Stay (HOSPITAL_COMMUNITY): Payer: Commercial Managed Care - PPO | Admitting: Physical Therapy

## 2020-01-25 ENCOUNTER — Inpatient Hospital Stay (HOSPITAL_COMMUNITY): Payer: Commercial Managed Care - PPO

## 2020-01-25 LAB — CBC WITH DIFFERENTIAL/PLATELET
Abs Immature Granulocytes: 0.03 10*3/uL (ref 0.00–0.07)
Basophils Absolute: 0.1 10*3/uL (ref 0.0–0.1)
Basophils Relative: 1 %
Eosinophils Absolute: 0.2 10*3/uL (ref 0.0–0.5)
Eosinophils Relative: 2 %
HCT: 34.6 % — ABNORMAL LOW (ref 36.0–46.0)
Hemoglobin: 11 g/dL — ABNORMAL LOW (ref 12.0–15.0)
Immature Granulocytes: 0 %
Lymphocytes Relative: 25 %
Lymphs Abs: 2.1 10*3/uL (ref 0.7–4.0)
MCH: 30.4 pg (ref 26.0–34.0)
MCHC: 31.8 g/dL (ref 30.0–36.0)
MCV: 95.6 fL (ref 80.0–100.0)
Monocytes Absolute: 0.7 10*3/uL (ref 0.1–1.0)
Monocytes Relative: 8 %
Neutro Abs: 5.2 10*3/uL (ref 1.7–7.7)
Neutrophils Relative %: 64 %
Platelets: 398 10*3/uL (ref 150–400)
RBC: 3.62 MIL/uL — ABNORMAL LOW (ref 3.87–5.11)
RDW: 12.9 % (ref 11.5–15.5)
WBC: 8.2 10*3/uL (ref 4.0–10.5)
nRBC: 0 % (ref 0.0–0.2)

## 2020-01-25 LAB — BASIC METABOLIC PANEL
Anion gap: 10 (ref 5–15)
BUN: 20 mg/dL (ref 6–20)
CO2: 28 mmol/L (ref 22–32)
Calcium: 10.1 mg/dL (ref 8.9–10.3)
Chloride: 102 mmol/L (ref 98–111)
Creatinine, Ser: 0.73 mg/dL (ref 0.44–1.00)
GFR calc Af Amer: >60 mL/min (ref >60)
GFR calc non Af Amer: >60 mL/min (ref >60)
Glucose, Bld: 95 mg/dL (ref 70–99)
Potassium: 4.3 mmol/L (ref 3.5–5.1)
Sodium: 140 mmol/L (ref 135–145)

## 2020-01-25 MED ORDER — FLUDROCORTISONE ACETATE 0.1 MG PO TABS
0.2000 mg | ORAL_TABLET | Freq: Every day | ORAL | Status: DC
  Administered 2020-01-26: 0.2 mg via ORAL
  Filled 2020-01-25: qty 2

## 2020-01-25 NOTE — Progress Notes (Signed)
Nutrition Follow-up  DOCUMENTATION CODES:   Non-severe (moderate) malnutrition in context of social or environmental circumstances, Underweight  INTERVENTION:  -Continue Ensure Enlive po TID, each supplement provides 350 kcal and 20 grams of protein -Continue MVI with minerals daily   NUTRITION DIAGNOSIS:   Moderate Malnutrition related to social / environmental circumstances as evidenced by mild fat depletion, moderate fat depletion, mild muscle depletion, moderate muscle depletion.  Ongoing.  GOAL:   Patient will meet greater than or equal to 90% of their needs  Progressing.  MONITOR:   PO intake, Supplement acceptance, Labs, Weight trends, Skin, I & O's  REASON FOR ASSESSMENT:   Malnutrition Screening Tool    ASSESSMENT:   Anna Campos is a 54 year old right-handed female with unremarkable past medical history except tobacco abuse on no prescription medications. She works in Clinical biochemist for [employer].Presented 12/18/2019 with syncope, orthostasis and generalized fatigue as well as reported fall by her family and questionable loss of consciousness.  Neurosurgery Dr. Jordan Likes consulted and plan would be for surgical intervention with decompression and stabilization after antibiotic therapy completed.  Hospital course complicated by hemoglobin 6.8 from admission hemoglobin 10.7 patient did receive 2 units packed red blood cells 12/25/2019.  Patient occult heme positive.  Gastroenterology services Dr. Matthias Hughs consulted underwent EGD 12/27/2019 showing normal-appearing esophagus.  Mild erythematous mucosa in the entire gastric cavity.  2 superficial prepyloric ulcers 1 with clean base and the other with pigmentation.  1 Endo Clip was deployed to close the mucosal defect.  Normal-appearing cardia and fundus.  H. pylori serology positive placed on triple therapy clindamycin amoxicillin PPI 12/30/2019 patient was cleared to resume intravenous heparin and PPI was added twice daily for 2  months.  Her latest hemoglobin was 9.4 on 01/07/2020.  Patient also with multiple dental caries underwent multiple extractions 12/30/2019.  Patient was later cleared for surgical intervention and underwent C3-4, 4-5, 5-6, C6-7 anterior cervical decompression and fusion utilizing interbody cages 01/04/2020 per Dr. Jordan Likes.  Cervical brace as directed.  Maintained on a regular diet.  She currently remains on intravenous heparin for pulmonary emboli and transitioned to Eliquis 01/11/2020.  Therapy evaluations completed and patient was admitted for a comprehensive rehab program.  Pt admitted with quadriplegia secondary to C5 incomplete ASIA C initially, now Dspinal cord injury.S/P C3-4 4-55-6 and C6-7 anterior cervical decompression and fusion 01/04/2020.  Pt reports appetite is good and that she enjoys her supplement.   PO Intake: 75-100% x last 7 recorded meals (91% average intake)  Admit wt: 53.8 kg Current wt: 55 kg  Labs reviewed. Medications reviewed and include: Ensure Enlive TID, [place], Florinef, MVI, Senokot-S   Diet Order:   Diet Order            Diet regular Room service appropriate? Yes; Fluid consistency: Thin  Diet effective now              EDUCATION NEEDS:   Education needs have been addressed  Skin:  Skin Assessment: Skin Integrity Issues: Skin Integrity Issues:: Incisions Stage II: sacrum, buttocks Incisions: neck  Last BM:  01/23/20  Height:   Ht Readings from Last 1 Encounters:  01/11/20 5\' 9"  (1.753 m)    Weight:   Wt Readings from Last 1 Encounters:  01/25/20 55 kg    BMI:  Body mass index is 17.91 kg/m.  Estimated Nutritional Needs:   Kcal:  1700-1900  Protein:  90-105 grams  Fluid:  > 1.7 L   03/26/20, MS, RD, LDN RD  pager number and weekend/on-call pager number located in [place].

## 2020-01-25 NOTE — Progress Notes (Signed)
Occupational Therapy Session Note  Patient Details  Name: Anna Campos MRN: [id number] Date of Birth: 1966-09-28  Today's Date: 01/25/2020 OT Individual Time: 1130-1200 OT Individual Time Calculation (min): 30 min    Short Term Goals: Week 3:  OT Short Term Goal 1 (Week 3): Pt will complete UB dressing with min assist for pullover shirt only. OT Short Term Goal 2 (Week 3): Pt will complete LB dressing with MOD A for pants only OT Short Term Goal 3 (Week 3): Pt will perform seated grooming task with MIN A for more than 2 consecutive sessions  Skilled Therapeutic Interventions/Progress Updates:    Pt resting in bed upon arrival. RN had removed Ace Wraps at pt's request.  BP sitting EOB (binder and Ted Hose) 93/65. Upon standing, pt c/o ringing in ears and "everything getting dark." Unable to get BP in standing but BP immediately on sitting EOB-78/64. Discussed importance of Ace Wraps in addition to binder and [facility]. Pt verbalizes understanding but also commented that she thinks it's because she just woke up from nap. BLE stretching seated in figure 4 and long sitting when returned to bed. Pt performed stand pivot to recliner.  Pt remained in recliner with BLE elevated.  All needs within reach. Seat alarm activated.  Therapy Documentation Precautions:  Precautions Precautions: Fall, Cervical Precaution Comments: reviewed cervical precautions; orthostatic Required Braces or Orthoses: Cervical Brace Cervical Brace: Soft collar(collar can be removed for showering per Dr. Berline Chough) Restrictions Weight Bearing Restrictions: No Other Position/Activity Restrictions: collar at all times per chart Pain:  Pt denies pain this morning   Therapy/Group: Individual Therapy  [name] 01/25/2020, 12:06 PM

## 2020-01-25 NOTE — Progress Notes (Signed)
Physical Therapy Session Note  Patient Details  Name: Anna Campos MRN: [id number] Date of Birth: 1965-10-28  Today's Date: 01/25/2020 PT Individual Time: 0800-0830; 1415-1530 PT Individual Time Calculation (min): 30 min and 75 min  Short Term Goals: Week 2:  PT Short Term Goal 1 (Week 2): Pt will complete transfers with min A consistently PT Short Term Goal 2 (Week 2): Pt will ambulate x 100 ft with assist x 1 consistently PT Short Term Goal 3 (Week 2): Pt will maintain dynamic standing balance with min A consistently  Skilled Therapeutic Interventions/Progress Updates:    Session 1: Pt received seated in recliner in room having just finished breakfast, agreeable to PT session. No complaints of pain. Pt has already donned abdominal binder. Pt is dependent to don BLE TEDs and ACE wrap for BP management. Sit to stand with min A. Ambulation 2 x 10 ft with PFRW and min A in room to the sink. Pt is setup A to wash her face while seated in w/c at sink. Assisted pt back to recliner at end of session. Pt left seated in recliner with needs in reach, hot pack to lower neck region for pain management.  Session 2: Pt received seated in recliner in room, agreeable to PT session. No complaints of pain. Pt only with abdominal binder and TEDs on, no ACE wrap. Seated BP 91/60, standing BP 66/45. Assisted pt with donning BLE ACE wraps. Standing BP with ACE wraps 69/52 and pt with onset of symptoms of orthostasis. Returned to sitting and deferred standing activity this session. Stand pivot transfer recliner to w/c with min A. Took pt outdoors for improved mood and change of scenery. While outdoors pt engaged in seated balance and UE motor task performing ball toss progressing to volleyball toss with use of BUE. Seated biceps curls with use of 1# weight B, wrist supination/pronation with 1# on L and no weight on R, wrist ext with no weight B x 10-15 reps each for BUE strengthening. Assisted pt with changing out  stockinet on soft collar. Assisted pt back to bed at end of session. Stand pivot transfer w/c to bed with min A. Sit to supine Supervision. Pt left seated in bed with needs in reach, bed alarm in place at end of session.  Therapy Documentation Precautions:  Precautions Precautions: Fall, Cervical Precaution Comments: reviewed cervical precautions; orthostatic Required Braces or Orthoses: Cervical Brace Cervical Brace: Soft collar(collar can be removed for showering per Dr. Berline Chough) Restrictions Weight Bearing Restrictions: No Other Position/Activity Restrictions: collar at all times per chart    Therapy/Group: Individual Therapy   Peter Congo, PT, DPT  01/25/2020, 8:43 AM

## 2020-01-25 NOTE — Progress Notes (Signed)
[facility] PHYSICAL MEDICINE & REHABILITATION PROGRESS NOTE   Subjective/Complaints:  Pt reports pain is OK. Steristrips coming off.  Wearing abd binder.  Doesn't like ACE wraps.    ROS:   Pt denies SOB, abd pain, CP, N/V/C/D, and vision changes  Objective:   No results found. Recent Labs    01/25/20 0513  WBC 8.2  HGB 11.0*  HCT 34.6*  PLT 398   Recent Labs    01/25/20 0513  NA 140  K 4.3  CL 102  CO2 28  GLUCOSE 95  BUN 20  CREATININE 0.73  CALCIUM 10.1    Intake/Output Summary (Last 24 hours) at 01/25/2020 0856 Last data filed at 01/25/2020 0826 Gross per 24 hour  Intake 1080 ml  Output --  Net 1080 ml     Physical Exam: Vital Signs Blood pressure 95/61, pulse 74, temperature 98.1 F (36.7 C), temperature source Oral, resp. rate 19, height 5\' 9"  (1.753 m), weight 55 kg, SpO2 100 %.  Constitutional: sitting up in  In bedside chair, NAD HEENT: missing all teeth Neck: supple Cardiovascular: RR; no JVD Respiratory/Chest: CTA B/L- good air movement GI/Abdomen:soft, NT, ND, (+)BS Ext: no clubbing, cyanosis, or edema Psych: pleasant and cooperative Musc: No edema in extremities.  No tenderness in extremities. Neuro: Ox3 Motor: RUE: Shoulder abduction 2+/5, elbow flexion/extension 3+/5, handgrip 2/5,  Finger abd 1/5 LUE: Shoulder abduction 2+/5 , elbow flexion/extension 3/5, and grip 3-/5; finger abd 1/5 Left lower extremity: 4/5 proximal distal Right lower extremity: Hip flexion, knee extension 3 to 3+/5, ankle dorsiflexion 3/5  Assessment/Plan: 1. Functional deficits secondary to C5 ASIA D due to osteomyelitis which require 3+ hours per day of interdisciplinary therapy in a comprehensive inpatient rehab setting.  Physiatrist is providing close team supervision and 24 hour management of active medical problems listed below.  Physiatrist and rehab team continue to assess barriers to discharge/monitor patient progress toward functional and medical  goals  Care Tool:  Bathing    Body parts bathed by patient: Face   Body parts bathed by helper: Right arm, Left arm     Bathing assist Assist Level: Minimal Assistance - Patient > 75%     Upper Body Dressing/Undressing Upper body dressing   What is the patient wearing?: Pull over shirt    Upper body assist Assist Level: Moderate Assistance - Patient 50 - 74%    Lower Body Dressing/Undressing Lower body dressing      What is the patient wearing?: Pants, Underwear/pull up     Lower body assist Assist for lower body dressing: Maximal Assistance - Patient 25 - 49%     Toileting Toileting    Toileting assist Assist for toileting: Total Assistance - Patient < 25%     Transfers Chair/bed transfer  Transfers assist     Chair/bed transfer assist level: Minimal Assistance - Patient > 75%     Locomotion Ambulation   Ambulation assist      Assist level: Minimal Assistance - Patient > 75% Assistive device: Walker-platform Max distance: 150'   Walk 10 feet activity   Assist     Assist level: Minimal Assistance - Patient > 75% Assistive device: Walker-platform   Walk 50 feet activity   Assist Walk 50 feet with 2 turns activity did not occur: Safety/medical concerns  Assist level: Minimal Assistance - Patient > 75% Assistive device: Walker-platform    Walk 150 feet activity   Assist Walk 150 feet activity did not occur: Safety/medical concerns  Assist  level: Minimal Assistance - Patient > 75% Assistive device: Walker-platform    Walk 10 feet on uneven surface  activity   Assist Walk 10 feet on uneven surfaces activity did not occur: Safety/medical concerns         Wheelchair     Assist Will patient use wheelchair at discharge?: Yes Type of Wheelchair: Manual    Wheelchair assist level: Supervision/Verbal cueing Max wheelchair distance: 45'    Wheelchair 50 feet with 2 turns activity    Assist        Assist Level:  Dependent - Patient 0%   Wheelchair 150 feet activity     Assist      Assist Level: Dependent - Patient 0%   Blood pressure 95/61, pulse 74, temperature 98.1 F (36.7 C), temperature source Oral, resp. rate 19, height 5\' 9"  (1.753 m), weight 55 kg, SpO2 100 %.  Medical Problem List and Plan: 1.  Quadriplegia secondary to C5 incomplete ASIA C initially, now D spinal cord injury.S/P C3-4 4-55-6 and C6-7 anterior cervical decompression and fusion 01/04/2020.  Cervical brace as directed  Continue CIR   -advised her to discuss dc date with her primary team this week  4/5- pt asking to leave early- after d/w pt risks/benefits of going/staying, she decided wanted to wait til 4/17 to leave- strength slightly improving.  2.  Antithrombotics/pulmonary emboli per CT angiogram of chest 12/20/2019: Lower extremity Doppler negative.   -DVT/anticoagulation: Intravenous heparin transitioned to Eliquis 01/11/2020             -antiplatelet therapy: N/A 3. Pain Management: Oxycodone and Robaxin as needed  4/6- pain controlled- con't meds 4. Mood: Provide emotional support             -antipsychotic agents: N/A 5. Neuropsych: This patient is capable of making decisions on her own behalf. 6. Skin/Wound Care: Routine skin checks 7. Fluids/Electrolytes/Nutrition: Routine in and outs 8.  Anemia.  EGD completed 12/27/2019 per GI services.  Recommend PPI x2 months.  H. pylori serology positive.  Complete course of amoxicillin and Biaxin  Hemoglobin 11.0 on 3/24   4/1- pt wants iron- will start Ferrous gluconate  4/2- tolerating iron so far- had bowel program last night for some reason will verify with nursing.   4/5- off bowel program- going daily most days   Continue to monitor 9. Multiple dental caries.  Follow-up dental services after multiple extractions 12/30/2019 10. Neurogenic bowel due to SCI.    Continue bowel program  3/29 - will change to QOD per pt's wishes/bowel habits  3/30- pt now able to  control BMs so will stop bowel program  Had a bm 4/3  4/5- as above 11 Streptococcal bacteremia.  Intravenous ceftriaxone completed. 12.  History of tobacco use.  Counseling 13. Spasticity- improved since surgery- for now, doesn't need spasticity agents-  14. Neurogenic bladder/frequency  Pt was started on Flomax- working well- no caths required in last 24 hours- will monitor.   PVRs with low volumes  Urinary frequency improving, cont Enablex 7.5 on 3/27  3/30- voiding well- PVRs look great- will stop PVRs after makes sure not retaining with cessation of flomax- con't Enablex- for urgency- will stop Flomax and make sure she's not retaining.   4/2- not retaining- stopped PVRs 15.  Hyponatremia  Sodium 133 on 3/24  16.  Transaminitis  LFTs elevated on 3/24   3/29- LFTs increase has resolved 17. Orthostatic hypotension  4/1- will add TEDS, abd binder and Florinef 0.1 mg  daily since was so low and still low this AM  4/2- advised pt needs to continue Florinef- Sx's due to low BP, NOT medicine- clammy/dizzy- will con't  4/4 BP's look good again today  4/5- Having Bps of 90/50s with PT with standing- asymptomatic- will always be low, but if doesn't need more meds, won't increase.   4/6- they are trying to use abd binder, florinef and ACE wraps- encouraged pt if no Sx's, to go without ACE wraps since they are uncomfortable sometimes.   LOS: 14 days A FACE TO FACE EVALUATION WAS PERFORMED  Allysia Ingles 01/25/2020, 8:56 AM

## 2020-01-25 NOTE — Progress Notes (Addendum)
Occupational Therapy Session Note  Patient Details  Name: Anna Campos MRN: [id number] Date of Birth: 30-Apr-1966  Today's Date: 01/25/2020 OT Individual Time: 0900-1000 OT Individual Time Calculation (min): 60 min    Short Term Goals: Week 2:  OT Short Term Goal 1 (Week 2): Pt will complete UB bathing with min assist for 2 consecutive sessions with use of a washmit PRN. OT Short Term Goal 1 - Progress (Week 2): Met OT Short Term Goal 2 (Week 2): Pt will complete UB dressing with min assist for pullover shirt only. OT Short Term Goal 2 - Progress (Week 2): Progressing toward goal OT Short Term Goal 3 (Week 2): Pt will complete UB dressing with min assist for pullover shirt only. OT Short Term Goal 4 (Week 2): Pt/family will return demonstrate safe completion of AAROM exercises for BUE following handout. OT Short Term Goal 4 - Progress (Week 2): Met OT Short Term Goal 5 (Week 2): Pt/family will return demonstrate safe completion of AAROM exercises for BUE following handout.  Skilled Therapeutic Interventions/Progress Updates:  Pt received seated in recliner with ABD binder, bil ace wraps and thigh high ted hose donned agreeable to OT intervention. Pt requesting to shower however shower ABD binder had not arrived and pt continued to be orthostatic reporting mild symptoms this session ( see vitals below). Pt work on LB/UB dressing from EOB. Pt required MINA for UB dressing to pull head of shirt over collar and MOD A for LB dressing via figure four.Pt able to figure four LLE but requires assist to come into full position for RLE. Education provided on compensatory techniques throughout to complete UB/LB dressing from EOB. Pt completed x3 sit<>stand from EOB with CGA- MIN A. Pt noted to lean on bed to power into standing however education provided on carrying over technique at home as long as bed height is equivalent. Pt returned to supine with supervision. Pt left supine in bed with bed alarm activated  and all needs within reach.   Therapy Documentation Precautions:  Precautions Precautions: Fall, Cervical Precaution Comments: reviewed cervical precautions; orthostatic Required Braces or Orthoses: Cervical Brace Cervical Brace: Soft collar(collar can be removed for showering per Dr. Dagoberto Ligas) Restrictions Weight Bearing Restrictions: No Other Position/Activity Restrictions: collar at all times per chart General:   Vital Signs:  Sitting at start of session: 99/69 Standing: 89/59 Standing: 69/55  Pain: Pt reports no pain during session.   Therapy/Group: Individual Therapy  Ihor Gully 01/25/2020, 11:36 AM

## 2020-01-25 NOTE — Patient Care Conference (Signed)
Inpatient RehabilitationTeam Conference and Plan of Care Update Date: 01/25/2020   Time: 11:05 AM    Patient Name: Anna Campos      Medical Record Number: [id number]  Date of Birth: 11/29/1965 Sex: Female         Room/Bed: 4W24C/4W24C-01 Payor Info: Payor: [employer] park manager / Plan: [employer] PPO / Product Type: *No Product type* /    Admit Date/Time:  01/11/2020  4:34 PM  Primary Diagnosis:  Incomplete quadriplegia at C5-6 level [facility])  Patient Active Problem List   Diagnosis Date Noted  . Pressure injury of skin 01/15/2020  . Transaminitis   . Hyponatremia   . Anemia   . Neurogenic bowel 01/11/2020  . Neurogenic bladder 01/11/2020  . Malnutrition of moderate degree 01/01/2020  . Poor dentition   . Spinal stenosis of cervical region   . Incomplete quadriplegia at C5-6 level ([place])   . Hypotension   . Bacteremia   . Syncope 12/19/2019    Expected Discharge Date: Expected Discharge Date: 02/05/20  Team Members Present: Physician leading conference: Dr. Courtney Heys Care Coodinator Present: Erlene Quan, BSW;Genie Chayton Murata, RN, MSN Nurse Present: Ellison Carwin, LPN PT Present: Excell Seltzer, PT OT Present: Roanna Epley, [place], OT PPS Coordinator present : Ileana Ladd, PT     Current Status/Progress Goal Weekly Team Focus  Bowel/Bladder   Continent of b/b: LBM 01/23/20  remain continent of b/b with minimal assistance  Assist with toileting needs prn   Swallow/Nutrition/ Hydration             ADL's   bathing MOD- MAX A, dressing- MAX A, toilet transfers MIN A via functional mobility, MIN A sit<>stand, MIN A for grooming tasks related to [facility]  min A overall  Struble tasks related to ADL participation, functional transfer training and BADL retraining, IADL tasks and education   Mobility   Supervision bed mobility, min A sit to stand and SPT, gait 150-200 ft with PFRW and CGA to min A  min A overall, will upgrade to Supervision      Communication              Safety/Cognition/ Behavioral Observations            Pain   C/O neck pain 8/10; oxycodone prn  Pain score < or equal to 4/10  Asses pain level q shfit and prn   Skin   Incision to neck  Remain free of infection and skin breakdown  Assess skin q shift and prn    Rehab Goals Patient on target to meet rehab goals: Yes *See Care Plan and progress notes for long and short-term goals.     Barriers to Discharge  Current Status/Progress Possible Resolutions Date Resolved   Nursing                  PT                    OT                  SLP                SW                Discharge Planning/Teaching Needs:  D/c to home with 24/7 care from her husband. Husband intends to take FMLA.  Family education as recommended by therapy   Team Discussion: MD med stable, off bowel program, orthostatic, will adjust meds.  RN cont B/B,  BP low in therapy, more pain.  PT BP orthostatic, min A transfers, amb 200' PFW, S bed.  OT Bathing mod/max, Dressing mod A LB, D min A UB, orthostatic stood 89/59, down to 69/55, was symptomatic.   Revisions to Treatment Plan: N/A     Medical Summary Current Status: Continent B/B; steristrips on incision; skin fine; pain not quite as controlled Weekly Focus/Goal: PT- BPs better; min assist transfers; platform walker for gait- can't use RW; bed supervision  Barriers to Discharge: Decreased family/caregiver support;Home enviroment access/layout;Weight bearing restrictions;Wound care;Weight  Barriers to Discharge Comments: orthostatic issues Possible Resolutions to Barriers: mod-max assist bathing; dressing LB mod assist; UB min assist; orthostatic this AM- wanted ot shower- but couldn't due to low BP.   Continued Need for Acute Rehabilitation Level of Care: The patient requires daily medical management by a physician with specialized training in physical medicine and rehabilitation for the following reasons: Direction of a multidisciplinary physical  rehabilitation program to maximize functional independence : Yes Medical management of patient stability for increased activity during participation in an intensive rehabilitation regime.: Yes Analysis of laboratory values and/or radiology reports with any subsequent need for medication adjustment and/or medical intervention. : Yes   I attest that I was present, lead the team conference, and concur with the assessment and plan of the team.   Trish Mage 01/25/2020, 4:32 PM   Team conference was held via web/ teleconference due to COVID - 19

## 2020-01-25 NOTE — Plan of Care (Signed)
  Problem: RH Dressing Goal: LTG Patient will perform upper body dressing (OT) Description: LTG Patient will perform upper body dressing with assist, with/without cues (OT). Flowsheets (Taken 01/25/2020 1612) LTG: Pt will perform upper body dressing with assistance level of: (downgraded JLS) Minimal Assistance - Patient > 75% Note: Downgraded JLS Goal: LTG Patient will perform lower body dressing w/assist (OT) Description: LTG: Patient will perform lower body dressing with assist, with/without cues in positioning using equipment (OT) Flowsheets (Taken 01/25/2020 1612) LTG: Pt will perform lower body dressing with assistance level of: (downgraded JLs) Moderate Assistance - Patient 50 - 74% Note: Downgraded due to decr [facility] JLS   Problem: RH Simple Meal Prep Goal: LTG Patient will perform simple meal prep w/assist (OT) Description: LTG: Patient will perform simple meal prep with assistance, with/without cues (OT). Flowsheets (Taken 01/25/2020 1612) LTG: Pt will perform simple meal prep with assistance level of: (d/c goal at this time) -- Note: D/c goal at this time; will have A   Problem: RH Tub/Shower Transfers Goal: LTG Patient will perform tub/shower transfers w/assist (OT) Description: LTG: Patient will perform tub/shower transfers with assist, with/without cues using equipment (OT) Flowsheets (Taken 01/25/2020 1612) LTG: Pt will perform tub/shower stall transfers with assistance level of: (downgraded JLS) Moderate Assistance - Patient 50 - 74% Note: Downgraded JLS

## 2020-01-26 ENCOUNTER — Inpatient Hospital Stay (HOSPITAL_COMMUNITY): Payer: Commercial Managed Care - PPO | Admitting: Physical Therapy

## 2020-01-26 ENCOUNTER — Inpatient Hospital Stay (HOSPITAL_COMMUNITY): Payer: Commercial Managed Care - PPO | Admitting: *Deleted

## 2020-01-26 ENCOUNTER — Inpatient Hospital Stay (HOSPITAL_COMMUNITY): Payer: Commercial Managed Care - PPO

## 2020-01-26 MED ORDER — FLUDROCORTISONE ACETATE 0.1 MG PO TABS
0.2000 mg | ORAL_TABLET | Freq: Every day | ORAL | Status: DC
  Administered 2020-01-27 – 2020-02-05 (×10): 0.2 mg via ORAL
  Filled 2020-01-26 (×10): qty 2

## 2020-01-26 NOTE — Progress Notes (Signed)
Recreational Therapy Session Note  Patient Details  Name: Anna Campos MRN: [id number] Date of Birth: [birth year] Today's Date: 01/26/2020 Time:  11-150 Pain: no c/o Skilled Therapeutic Interventions/Progress Updates: Session focused on activity tolerance, community ambulation using PFRW,  discharge planning during co-treat with PT.  Also discussed questions to speak with MD about including alcohol consumption and sexual intimacy at discharge.  Pt ambulated on uneven surfaces with RW >200' with min assist.  Pt did required verbal cues for safety as she was fearful of wasp flying around her and began swaying and swatting.  Pt stated understanding of the above and appreciative of being outside.  Therapy/Group: Co-Treatment  Kristofer Schaffert,Enijah 01/26/2020, 3:25 PM

## 2020-01-26 NOTE — Progress Notes (Signed)
Occupational Therapy Session Note  Patient Details  Name: Florenda Watt MRN: [id number] Date of Birth: April 29, 1966  Today's Date: 01/26/2020 OT Individual Time: 0200-0300 OT Individual Time Calculation (min): 60 min    Short Term Goals: Week 3:  OT Short Term Goal 1 (Week 3): Pt will complete UB dressing with min assist for pullover shirt only. OT Short Term Goal 2 (Week 3): Pt will complete LB dressing with MOD A for pants only OT Short Term Goal 3 (Week 3): Pt will perform seated grooming task with MIN A for more than 2 consecutive sessions  Skilled Therapeutic Interventions/Progress Updates:  Pt received seated in recliner finishing up lunch agreeable to OT intervention. Pt completed household distance functional mobility from recliner >w/c with PFRW and MIN A. Pt transported to therapy gym in w/c with total A. Session focus on graded stair stepping therapeutic activity to facilitate BUE strength and coordination as precursor higher level functional mobility. Pt progressed from weight shifting laterally to tap numbers on floor with BLEs > to cone taps in standing > stepping onto 3 inch step with PFRW. Pt required MIN A for balance and verbal cues to navigate 3 inch step. Pt completed anterior weight shifting therapeutic activity as precursor to functional sit<>stands where pt instructed to lean forward to place cards on mirror. Pt required MIN A for half squat from w/c and verbal/ visual cues for feet placement and overall body mechanics. Pt transported back to room in similar fashion as previously indicated. Pt returned to supine with supervision. Pt left supine with all needs within reach and bed alarm activated.   Therapy Documentation Precautions:  Precautions Precautions: Fall, Cervical Precaution Comments: reviewed cervical precautions; orthostatic Required Braces or Orthoses: Cervical Brace Cervical Brace: Soft collar(collar can be removed for showering per Dr.  Berline Chough) Restrictions Weight Bearing Restrictions: No Other Position/Activity Restrictions: collar at all times per chart General:   Vital Signs: Therapy Vitals Temp: 98.3 F (36.8 C) Pulse Rate: 98 Resp: 17 BP: 105/76 Patient Position (if appropriate): Sitting Oxygen Therapy SpO2: 100 % O2 Device: Room Air Pain: Pt reports mild pain in L shoulder; provided increased rest breaks as pain mgmt strategy.   Therapy/Group: Individual Therapy  Angelina Pih 01/26/2020, 3:44 PM

## 2020-01-26 NOTE — Progress Notes (Signed)
[place] PHYSICAL MEDICINE & REHABILITATION PROGRESS NOTE   Subjective/Complaints:  Pt rPt reports and PT also reports doesn't get AM florinef until 8-8:30 am which is too late for first session- will switch to 6:30 am- Also explained even if BP 91/62 like was this AM, if pt asymptomatic, don't worry- SCI pt will have low BP lifelong, but only treat is has Sx's.   Went over with pt and PT in room.   Pt denies any issues except feels hot- room is on warm side.  ROS:   Pt denies SOB, abd pain, CP, N/V/C/D, and vision changes   Objective:   No results found. Recent Labs    01/25/20 0513  WBC 8.2  HGB 11.0*  HCT 34.6*  PLT 398   Recent Labs    01/25/20 0513  NA 140  K 4.3  CL 102  CO2 28  GLUCOSE 95  BUN 20  CREATININE 0.73  CALCIUM 10.1    Intake/Output Summary (Last 24 hours) at 01/26/2020 1646 Last data filed at 01/26/2020 0830 Gross per 24 hour  Intake 240 ml  Output --  Net 240 ml     Physical Exam: Vital Signs Blood pressure 105/76, pulse 98, temperature 98.3 F (36.8 C), resp. rate 17, height 5\' 9"  (1.753 m), weight 54.5 kg, SpO2 100 %.  Constitutional: sitting up in manual w/c, with PT at side, NAD HEENT: chronic missing teeth Neck: supple; wearing soft cervical collar Cardiovascular: RRR- no M/R/G Respiratory/Chest: CTA B/L- no accessory muscle use GI/Abdomen:soft, NT, ND (+)BS hypoactive Ext: no clubbing, cyanosis, or edema Psych: bright affect Musc: no edema B/L Neuro: Ox3 Motor: RUE: Shoulder abduction 2+/5, elbow flexion/extension 3+/5, handgrip 2/5,  Finger abd 1/5 LUE: Shoulder abduction 2+/5 , elbow flexion/extension 3/5, and grip 3-/5; finger abd 1/5 Left lower extremity: 4/5 proximal distal Right lower extremity: Hip flexion, knee extension 3 to 3+/5, ankle dorsiflexion 3/5  Assessment/Plan: 1. Functional deficits secondary to C5 ASIA D due to osteomyelitis which require 3+ hours per day of interdisciplinary therapy in a comprehensive  inpatient rehab setting.  Physiatrist is providing close team supervision and 24 hour management of active medical problems listed below.  Physiatrist and rehab team continue to assess barriers to discharge/monitor patient progress toward functional and medical goals  Care Tool:  Bathing    Body parts bathed by patient: Face   Body parts bathed by helper: Right arm, Left arm     Bathing assist Assist Level: Minimal Assistance - Patient > 75%     Upper Body Dressing/Undressing Upper body dressing   What is the patient wearing?: Pull over shirt    Upper body assist Assist Level: Minimal Assistance - Patient > 75%    Lower Body Dressing/Undressing Lower body dressing      What is the patient wearing?: Pants     Lower body assist Assist for lower body dressing: Moderate Assistance - Patient 50 - 74%     Toileting Toileting    Toileting assist Assist for toileting: Total Assistance - Patient < 25%     Transfers Chair/bed transfer  Transfers assist     Chair/bed transfer assist level: Minimal Assistance - Patient > 75%     Locomotion Ambulation   Ambulation assist      Assist level: Minimal Assistance - Patient > 75% Assistive device: Walker-platform Max distance: 150'   Walk 10 feet activity   Assist     Assist level: Minimal Assistance - Patient > 75% Assistive device:  Walker-platform   Walk 50 feet activity   Assist Walk 50 feet with 2 turns activity did not occur: Safety/medical concerns  Assist level: Minimal Assistance - Patient > 75% Assistive device: Walker-platform    Walk 150 feet activity   Assist Walk 150 feet activity did not occur: Safety/medical concerns  Assist level: Minimal Assistance - Patient > 75% Assistive device: Walker-platform    Walk 10 feet on uneven surface  activity   Assist Walk 10 feet on uneven surfaces activity did not occur: Safety/medical concerns   Assist level: Minimal Assistance - Patient >  75% Assistive device: Walker-platform   Wheelchair     Assist Will patient use wheelchair at discharge?: Yes Type of Wheelchair: Manual    Wheelchair assist level: Supervision/Verbal cueing Max wheelchair distance: 35'    Wheelchair 50 feet with 2 turns activity    Assist        Assist Level: Dependent - Patient 0%   Wheelchair 150 feet activity     Assist      Assist Level: Dependent - Patient 0%   Blood pressure 105/76, pulse 98, temperature 98.3 F (36.8 C), resp. rate 17, height 5\' 9"  (1.753 m), weight 54.5 kg, SpO2 100 %.  Medical Problem List and Plan: 1.  Quadriplegia secondary to C5 incomplete ASIA C initially, now D spinal cord injury.S/P C3-4 4-55-6 and C6-7 anterior cervical decompression and fusion 01/04/2020.  Cervical brace as directed  Continue CIR   -advised her to discuss dc date with her primary team this week  4/5- pt asking to leave early- after d/w pt risks/benefits of going/staying, she decided wanted to wait til 4/17 to leave- strength slightly improving.  2.  Antithrombotics/pulmonary emboli per CT angiogram of chest 12/20/2019: Lower extremity Doppler negative.   -DVT/anticoagulation: Intravenous heparin transitioned to Eliquis 01/11/2020             -antiplatelet therapy: N/A 3. Pain Management: Oxycodone and Robaxin as needed  4/6- pain controlled- con't meds  4/7- pt denies any pain currently- taking meds rarely.  4. Mood: Provide emotional support             -antipsychotic agents: N/A 5. Neuropsych: This patient is capable of making decisions on her own behalf. 6. Skin/Wound Care: Routine skin checks 7. Fluids/Electrolytes/Nutrition: Routine in and outs 8.  Anemia.  EGD completed 12/27/2019 per GI services.  Recommend PPI x2 months.  H. pylori serology positive.  Complete course of amoxicillin and Biaxin  Hemoglobin 11.0 on 3/24   4/1- pt wants iron- will start Ferrous gluconate  4/2- tolerating iron so far- had bowel program last  night for some reason will verify with nursing.   4/5- off bowel program- going daily most days   Continue to monitor 9. Multiple dental caries.  Follow-up dental services after multiple extractions 12/30/2019 10. Neurogenic bowel due to SCI.    Continue bowel program  3/29 - will change to QOD per pt's wishes/bowel habits  3/30- pt now able to control BMs so will stop bowel program  4/7- BMs regular- con't meds 11 Streptococcal bacteremia.  Intravenous ceftriaxone completed. 12.  History of tobacco use.  Counseling 13. Spasticity- improved since surgery- for now, doesn't need spasticity agents-  14. Neurogenic bladder/frequency  Pt was started on Flomax- working well- no caths required in last 24 hours- will monitor.   PVRs with low volumes  Urinary frequency improving, cont Enablex 7.5 on 3/27  3/30- voiding well- PVRs look great- will stop PVRs  after makes sure not retaining with cessation of flomax- con't Enablex- for urgency- will stop Flomax and make sure she's not retaining.   4/2- not retaining- stopped PVRs 15.  Hyponatremia  Sodium 133 on 3/24  16.  Transaminitis  LFTs elevated on 3/24   3/29- LFTs increase has resolved 17. Orthostatic hypotension  4/1- will add TEDS, abd binder and Florinef 0.1 mg daily since was so low and still low this AM  4/2- advised pt needs to continue Florinef- Sx's due to low BP, NOT medicine- clammy/dizzy- will con't  4/4 BP's look good again today  4/5- Having Bps of 90/50s with PT with standing- asymptomatic- will always be low, but if doesn't need more meds, won't increase.   4/6- they are trying to use abd binder, florinef and ACE wraps- encouraged pt if no Sx's, to go without ACE wraps since they are uncomfortable sometimes.   4/7- explained to team, if no Sx's, no worries over low BP- also moved florinef to 6:30am so gets before AM therapy.   LOS: 15 days A FACE TO FACE EVALUATION WAS PERFORMED  Lacey Dotson 01/26/2020, 4:46 PM

## 2020-01-26 NOTE — Progress Notes (Signed)
Physical Therapy Weekly Progress Note  Patient Details  Name: Anna Campos MRN: [id number] Date of Birth: 11/22/1965  Beginning of progress report period: January 20, 2020 End of progress report period: January 26, 2020  Today's Date: 01/26/2020 PT Individual Time: 9450-3888; 1100-1200 PT Individual Time Calculation (min): 75 min and 60 min  Patient has met 3 of 3 short term goals.  Pt is making good progress towards LTG. Pt is currently at Supervision level for bed mobility, min A overall for transfers, and min A for gait 200 ft (+) with use of PFRW. Pt does still require cues for safety awareness at times. Pt will need to be able to progress to performing stairs safely prior to d/c home and has a goal of progressing from [place] use to regular RW. Pt exhibits good motivation and participation in therapy sessions. Pt also has been limited at times over the past week in her ability to participate in standing activity due to orthostatic hypotension. Pt's BP is being controlled via medication and with abdominal binder, thigh-high TEDs, and BLE ACE wraps. Ongoing education with patient with regards to BP and how it relates to her SCI.  Patient continues to demonstrate the following deficits muscle weakness, abnormal tone, unbalanced muscle activation, ataxia and decreased coordination and decreased standing balance, decreased postural control and decreased balance strategies and therefore will continue to benefit from skilled PT intervention to increase functional independence with mobility.  Patient progressing toward long term goals..  Continue plan of care.  PT Short Term Goals Week 2:  PT Short Term Goal 1 (Week 2): Pt will complete transfers with min A consistently PT Short Term Goal 1 - Progress (Week 2): Met PT Short Term Goal 2 (Week 2): Pt will ambulate x 100 ft with assist x 1 consistently PT Short Term Goal 2 - Progress (Week 2): Met PT Short Term Goal 3 (Week 2): Pt will maintain dynamic  standing balance with min A consistently PT Short Term Goal 3 - Progress (Week 2): Met Week 3:  PT Short Term Goal 1 (Week 3): =LTG due to ELOS  Skilled Therapeutic Interventions/Progress Updates:    Session 1: Patient received seated in recliner in room, agreeable to PT session. No complaints of pain initially, does have onset of L upper trap pain with mobility. RN able to provide pain medication to patient. Seated BP 93/64 with use of abdominal binder and thigh-high TEDs. Sit to stand with min A. Standing BP 56/46 and pt reports feeling "hot" and feeling the room going dark. Returned to sitting and assisted pt with donning BLE ACE wraps. Pt remains asymptomatic with regards to orthostasis for remainder of session. Per MD ok to mobilize pt even if BP readings are low and to base treatment on pt symptoms. Standing alt L/R marches x 10 reps with min HHA. Ambulation forwards/backwards 3 x 10 ft with BUE HHA. Standing alt L/R 4" step-taps with PFRW and min A for balance x 10 reps B. Standing mini-squats 2 x 10 reps with min HHA, improved control noted this date. Standing L/R side steps 2 x 10 ft with min HHA of BUE. Ambulation x 150 ft and x 200 ft with PFRW and CGA to min A. Pt left seated in recliner in room with needs in reach, chair alarm in place, hot pack to posterior L shoulder region for pain management.  Session 2: Pt received seated in recliner in room, agreeable to PT session. No complaints of pain. Stand pivot transfer  recliner to w/c with min A and PFRW. Session focus on functional mobility in community level environment outdoors. Pt is able to ambulate 200 ft (+) with use of PFRW outdoors up/down inclines and across uneven ground. Pt requires min cueing for safe PFRW management. Discussed safety in the home with regards to obstacle avoidance, gait across various surfaces, etc. Also discussed questions to speak with MD about including alcohol consumption and sexual intimacy at discharge.  At one  point pt did required verbal cues for safety as she was fearful of wasp flying around her and began swaying and swatting. Pt exhibits decreased control of RLE with increase in ataxia and decreased LE clearance during gait with onset of fatigue. Pt left seated in recliner in room with needs in reach at end of session. Cotreatment session with RT.   Therapy Documentation Precautions:  Precautions Precautions: Fall, Cervical Precaution Comments: reviewed cervical precautions; orthostatic Required Braces or Orthoses: Cervical Brace Cervical Brace: Soft collar(collar can be removed for showering per Dr. Dagoberto Ligas) Restrictions Weight Bearing Restrictions: No Other Position/Activity Restrictions: collar at all times per chart   Therapy/Group: Individual Therapy   Excell Seltzer, PT, DPT  01/26/2020, 4:21 PM

## 2020-01-26 NOTE — Plan of Care (Signed)
  Problem: SCI BOWEL ELIMINATION Goal: RH STG MANAGE BOWEL WITH ASSISTANCE Description: STG Manage Bowel with mod I Assistance. Outcome: Progressing   Problem: SCI BLADDER ELIMINATION Goal: RH STG MANAGE BLADDER WITH ASSISTANCE Description: STG Manage Bladder With Mod I Assistance Outcome: Progressing

## 2020-01-27 ENCOUNTER — Inpatient Hospital Stay (HOSPITAL_COMMUNITY): Payer: Commercial Managed Care - PPO | Admitting: *Deleted

## 2020-01-27 ENCOUNTER — Inpatient Hospital Stay (HOSPITAL_COMMUNITY): Payer: Commercial Managed Care - PPO

## 2020-01-27 MED ORDER — SENNOSIDES-DOCUSATE SODIUM 8.6-50 MG PO TABS
2.0000 | ORAL_TABLET | Freq: Two times a day (BID) | ORAL | Status: DC
  Administered 2020-01-27 – 2020-02-04 (×17): 2 via ORAL
  Filled 2020-01-27 (×18): qty 2

## 2020-01-27 MED ORDER — TRAMADOL HCL 50 MG PO TABS
100.0000 mg | ORAL_TABLET | Freq: Three times a day (TID) | ORAL | Status: DC
  Administered 2020-01-27 – 2020-02-05 (×28): 100 mg via ORAL
  Filled 2020-01-27 (×28): qty 2

## 2020-01-27 NOTE — Progress Notes (Signed)
Pt educated on prn constipation medication. PRN sorbitol was adminsitered At 0400 pt stated she had pain in rectum and had to use the bathroom but was constipated. Pt requested suppository. Pt was given suppository and dig. stim.  and immediately had the urge to have a bowel movement. Pt had medium sized bowel movement . No other concerns to report

## 2020-01-27 NOTE — Progress Notes (Signed)
Social Work Patient ID: Anna Campos, female   DOB: 06-14-1966, 54 y.o.   MRN: [id number]   SW received phone call today from [employer] stating pt approved for 7 days with next review date due on 4/13 (p:[phone]). SW relayed to appropriate staff.    Cecile Sheerer, MSW, LCSWA Office: [phone] Cell: [phone] Fax: [phone]

## 2020-01-27 NOTE — Progress Notes (Signed)
Occupational Therapy Session Note  Patient Details  Name: Anna Campos MRN: [id number] Date of Birth: Jan 08, 1966  Today's Date: 01/27/2020 OT Individual Time: 1300-1325 OT Individual Time Calculation (min): 25 min    Short Term Goals: Week 3:  OT Short Term Goal 1 (Week 3): Pt will complete UB dressing with min assist for pullover shirt only. OT Short Term Goal 2 (Week 3): Pt will complete LB dressing with MOD A for pants only OT Short Term Goal 3 (Week 3): Pt will perform seated grooming task with MIN A for more than 2 consecutive sessions  Skilled Therapeutic Interventions/Progress Updates:    Pt resting in recliner upon arrival. OT intervention with focus on BUE NMR including soft tissue mobilizations for pain management and increase functional use of BUE, sit<>stand, standing balance, and stand pivot transfer without AD. Soft tissue mobilizations for upper traps to alleviate ongoing muscular discomfort and increase BUE AROM and function. Pt reports considerable relief from pain with associated increased AROM. Pt performed sit<>stand with min A and no AD X 4, standing balance without AD at min A, and stand pivot transfer to bed with min A without AD. Pt remained in bed with all needs within reach and bed alarm activated.   Therapy Documentation Precautions:  Precautions Precautions: Fall, Cervical Precaution Comments: reviewed cervical precautions; orthostatic Required Braces or Orthoses: Cervical Brace Cervical Brace: Soft collar(collar can be removed for showering per Dr. Berline Chough) Restrictions Weight Bearing Restrictions: No Other Position/Activity Restrictions: collar at all times per chart Pain:  Pt c/o muscular pain in upper traps; soft tissue manipulation, heat, and repositioned   Therapy/Group: Individual Therapy  [name] 01/27/2020, 2:27 PM

## 2020-01-27 NOTE — Progress Notes (Signed)
Physical Therapy Session Note  Patient Details  Name: Anna Campos MRN: [id number] Date of Birth: 07/21/1966  Today's Date: 01/27/2020 PT Individual Time: 1100-1200 PT Individual Time Calculation (min): 60 min   Short Term Goals: Week 3:  PT Short Term Goal 1 (Week 3): =LTG due to ELOS  Skilled Therapeutic Interventions/Progress Updates:   Pt in recliner and agreeable to therapy, no c/o pain. Donned thigh-high ACE wraps for BP management, pt denied dizziness throughout session. Ambulated w/ PFRW to therapy gym w/ CGA, >250' w/o rest break. Practiced negotiating steps w/ B rails, as per home set-up. Negotiated 4, step-over-step w/ mod assist and verbal/tactile cues for technique.  Pt needed mod assist to correct multiple LOBs. Discussed going step-to for safety and improved control. Brief seated rest break before re-attempting. Min assist 2nd bout to negotiate up/down 4 steps w/ B rails, step-to pattern w/ much improved safety and control. Pt verbalized understanding why step-to pattern is preferable and safer. Worked on standing balance and activity tolerance while performing activity that is salient to pt - dance Wii game. Stood for 2 songs and sat during 1 song 2/2 fatigue, each song 3-4 min long. CGA-min assist for dynamic standing balance while pt performed BUE movements and weight shifting at hips to dance w/o UE support. Pt performed arm movements only in seated. Pt very excited about activity and thankful she got to participate in it. Pt denied dizziness throughout and BP checked x3 times and WNL for pt (104/61, 102/76, 86/72). Returned to room total assist via w/c, ended session in recliner and all needs in reach.   Therapy Documentation Precautions:  Precautions Precautions: Fall, Cervical Precaution Comments: reviewed cervical precautions; orthostatic Required Braces or Orthoses: Cervical Brace Cervical Brace: Soft collar(collar can be removed for showering per Dr.  Berline Chough) Restrictions Weight Bearing Restrictions: No Other Position/Activity Restrictions: collar at all times per chart  Therapy/Group: Individual Therapy  Yair Dusza Melton Krebs 01/27/2020, 12:24 PM

## 2020-01-27 NOTE — Progress Notes (Signed)
Recreational Therapy Session Note  Patient Details  Name: Anna Campos MRN: [id number] Date of Birth: [birth year] Today's Date: 01/27/2020 Time:  11-12 Pain: no c/o Skilled Therapeutic Interventions/Progress Updates: Session focused on activity tolerance, dynamic standing balance during co-treat with PT.  Pt ambulated ~250' with PFRW with contact guard assist.  Pt practiced steps with PT with min verbal cues and Min assist.  Pt stood x2 for 3-4 minutes while playing the Wii Just Dance with min assist.  Pt sat in w/c due to fatigue for 3-4 minutes during on song.  Pt excited about dancing but fatigued at the end of the session.  Therapy/Group: Co-Treatment  Rexton Greulich,Talma 01/27/2020, 4:17 PM

## 2020-01-27 NOTE — Progress Notes (Signed)
[place] PHYSICAL MEDICINE & REHABILITATION PROGRESS NOTE   Subjective/Complaints:  Pt reports was EXTREMELY constipated last night-  Needs manual disimpaction- "never wants to feel like that again". Had been telling me she was going, then "stopped". Required enema as well.  Also c/o burning pain and aching pain in neck and hips and back.   Also asking how much alcohol can have when goes home- explained can have 1 drink- 12 oz beer, 4 oz wine or 1 oz shot ONCE off PAIN MEDS.   Also asking about sexuality and intimacy.    ROS:   Pt denies SOB, abd pain, CP, N/V/C/D, and vision changes    Objective:   No results found. Recent Labs    01/25/20 0513  WBC 8.2  HGB 11.0*  HCT 34.6*  PLT 398   Recent Labs    01/25/20 0513  NA 140  K 4.3  CL 102  CO2 28  GLUCOSE 95  BUN 20  CREATININE 0.73  CALCIUM 10.1    Intake/Output Summary (Last 24 hours) at 01/27/2020 0857 Last data filed at 01/26/2020 1837 Gross per 24 hour  Intake 500 ml  Output -  Net 500 ml     Physical Exam: Vital Signs Blood pressure 104/72, pulse 71, temperature 98.3 F (36.8 C), resp. rate 17, height 5\' 9"  (1.753 m), weight 54.5 kg, SpO2 100 %.  Constitutional: sitting up in bedside chair, appropriate, NAD HEENT: chronic missing teeth; conjugate gaze Neck: wearing soft collar- incision anterior- looks great Cardiovascular: RRR- no JVD Respiratory/Chest: CTA B/L- good air movement GI/Abdomen:soft, NT, ND, (+)BS- hypoactive Ext: no clubbing, cyanosis, or edema Psych: smiling affect Musc: no edema B/L Neuro: Ox3 Motor: RUE: Shoulder abduction 2+/5, elbow flexion/extension 3+/5, handgrip 2/5,  Finger abd 1/5 LUE: Shoulder abduction 2+/5 , elbow flexion/extension 3/5, and grip 3-/5; finger abd 1/5 Left lower extremity: 4/5 proximal distal Right lower extremity: Hip flexion, knee extension 3 to 3+/5, ankle dorsiflexion 3/5  Assessment/Plan: 1. Functional deficits secondary to C5 ASIA D due to  osteomyelitis which require 3+ hours per day of interdisciplinary therapy in a comprehensive inpatient rehab setting.  Physiatrist is providing close team supervision and 24 hour management of active medical problems listed below.  Physiatrist and rehab team continue to assess barriers to discharge/monitor patient progress toward functional and medical goals  Care Tool:  Bathing    Body parts bathed by patient: Face   Body parts bathed by helper: Right arm, Left arm     Bathing assist Assist Level: Minimal Assistance - Patient > 75%     Upper Body Dressing/Undressing Upper body dressing   What is the patient wearing?: Pull over shirt    Upper body assist Assist Level: Minimal Assistance - Patient > 75%    Lower Body Dressing/Undressing Lower body dressing      What is the patient wearing?: Pants     Lower body assist Assist for lower body dressing: Moderate Assistance - Patient 50 - 74%     Toileting Toileting    Toileting assist Assist for toileting: Total Assistance - Patient < 25%     Transfers Chair/bed transfer  Transfers assist     Chair/bed transfer assist level: Minimal Assistance - Patient > 75%     Locomotion Ambulation   Ambulation assist      Assist level: Minimal Assistance - Patient > 75% Assistive device: Walker-platform Max distance: 150'   Walk 10 feet activity   Assist     Assist  level: Minimal Assistance - Patient > 75% Assistive device: Walker-platform   Walk 50 feet activity   Assist Walk 50 feet with 2 turns activity did not occur: Safety/medical concerns  Assist level: Minimal Assistance - Patient > 75% Assistive device: Walker-platform    Walk 150 feet activity   Assist Walk 150 feet activity did not occur: Safety/medical concerns  Assist level: Minimal Assistance - Patient > 75% Assistive device: Walker-platform    Walk 10 feet on uneven surface  activity   Assist Walk 10 feet on uneven surfaces  activity did not occur: Safety/medical concerns   Assist level: Minimal Assistance - Patient > 75% Assistive device: Walker-platform   Wheelchair     Assist Will patient use wheelchair at discharge?: Yes Type of Wheelchair: Manual    Wheelchair assist level: Supervision/Verbal cueing Max wheelchair distance: 35'    Wheelchair 50 feet with 2 turns activity    Assist        Assist Level: Dependent - Patient 0%   Wheelchair 150 feet activity     Assist      Assist Level: Dependent - Patient 0%   Blood pressure 104/72, pulse 71, temperature 98.3 F (36.8 C), resp. rate 17, height 5\' 9"  (1.753 m), weight 54.5 kg, SpO2 100 %.  Medical Problem List and Plan: 1.  Quadriplegia secondary to C5 incomplete ASIA C initially, now D spinal cord injury.S/P C3-4 4-55-6 and C6-7 anterior cervical decompression and fusion 01/04/2020.  Cervical brace as directed  Continue CIR   -advised her to discuss dc date with her primary team this week  4/5- pt asking to leave early- after d/w pt risks/benefits of going/staying, she decided wanted to wait til 4/17 to leave- strength slightly improving.  2.  Antithrombotics/pulmonary emboli per CT angiogram of chest 12/20/2019: Lower extremity Doppler negative.   -DVT/anticoagulation: Intravenous heparin transitioned to Eliquis 01/11/2020             -antiplatelet therapy: N/A 3. Pain Management: Oxycodone and Robaxin as needed  4/8- pt reports pain not controlled anymore- used to be- but burning and aching- esp in neck and hips and back- will try to add tramadol 100 mg q8 hours for pain- also counseled can take 10 mg oxy IR-  4. Mood: Provide emotional support             -antipsychotic agents: N/A 5. Neuropsych: This patient is capable of making decisions on her own behalf. 6. Skin/Wound Care: Routine skin checks 7. Fluids/Electrolytes/Nutrition: Routine in and outs 8.  Anemia.  EGD completed 12/27/2019 per GI services.  Recommend PPI x2  months.  H. pylori serology positive.  Complete course of amoxicillin and Biaxin  Hemoglobin 11.0 on 3/24   4/1- pt wants iron- will start Ferrous gluconate  4/2- tolerating iron so far- had bowel program last night for some reason will verify with nursing.   4/5- off bowel program- going daily most days   Continue to monitor 9. Multiple dental caries.  Follow-up dental services after multiple extractions 12/30/2019 10. Neurogenic bowel due to SCI.    Continue bowel program  3/29 - will change to QOD per pt's wishes/bowel habits  3/30- pt now able to control BMs so will stop bowel program  4/8- will increase senokot-s to 2 tabs BID and d/c Iron since so constipated, and adding Tramadol 11 Streptococcal bacteremia.  Intravenous ceftriaxone completed. 12.  History of tobacco use.  Counseling 13. Spasticity- improved since surgery- for now, doesn't need spasticity agents-  14. Neurogenic bladder/frequency  Pt was started on Flomax- working well- no caths required in last 24 hours- will monitor.   PVRs with low volumes  Urinary frequency improving, cont Enablex 7.5 on 3/27  3/30- voiding well- PVRs look great- will stop PVRs after makes sure not retaining with cessation of flomax- con't Enablex- for urgency- will stop Flomax and make sure she's not retaining.   4/2- not retaining- stopped PVRs 15.  Hyponatremia  Sodium 133 on 3/24  16.  Transaminitis  LFTs elevated on 3/24   3/29- LFTs increase has resolved 17. Orthostatic hypotension  4/1- will add TEDS, abd binder and Florinef 0.1 mg daily since was so low and still low this AM  4/2- advised pt needs to continue Florinef- Sx's due to low BP, NOT medicine- clammy/dizzy- will con't  4/4 BP's look good again today  4/5- Having Bps of 90/50s with PT with standing- asymptomatic- will always be low, but if doesn't need more meds, won't increase.   4/6- they are trying to use abd binder, florinef and ACE wraps- encouraged pt if no Sx's, to go  without ACE wraps since they are uncomfortable sometimes.   4/7- explained to team, if no Sx's, no worries over low BP- also moved florinef to 6:30am so gets before AM therapy. 18. Sexuality/intimacy in SCI patient  4/8- discussed for 15 minutes about positions, sensation, orgasms and how to accomplish those topics.  19. Dispo  4/8- also advised no EtOH on pain meds- once off meds, can have ONE drink total in 24 hour period.  I spent a total of 35 minutes in total care of pt today discussing pain, sexuality and EtOH use.    LOS: 16 days A FACE TO FACE EVALUATION WAS PERFORMED  Wess Baney 01/27/2020, 8:57 AM

## 2020-01-27 NOTE — Progress Notes (Signed)
Occupational Therapy Session Note  Patient Details  Name: Anna Campos MRN: [id number] Date of Birth: Oct 31, 1965  Today's Date: 01/27/2020 OT Individual Time: 0539-7673 OT Individual Time Calculation (min): 60 min    Short Term Goals: Week 3:  OT Short Term Goal 1 (Week 3): Pt will complete UB dressing with min assist for pullover shirt only. OT Short Term Goal 2 (Week 3): Pt will complete LB dressing with MOD A for pants only OT Short Term Goal 3 (Week 3): Pt will perform seated grooming task with MIN A for more than 2 consecutive sessions  Skilled Therapeutic Interventions/Progress Updates:  Pt received seated in recliner agreeable to OT intervention. Session focus on BADL training via UB/LB bathing/ dressing. MIN A for stand pivot transfer to rolling semireclined shower chair. Utilized semi reclined  shower training as precaution for persistent hypotension.  Transported pt to shower with total A d/t continued OH. Pt completed UB/LB bathing with LH sponge and MIN A for cleanliness to wash lower legs. Pt with no orhtostatic s/s during shower with pt very appreciative of shower. Pt transported back out of shower in similar fashion as previously indicated, stand pivot transfer with MIN A back to recliner for dressing. MOD A for UB dressing and MAX A for LB Dressing. Pt left seated in recliner working on coloring with built up foam on colored pencils; however pt would benefit from crayons or different size built up foam to accommodate for narrow colored pencils. Chair alarm activated and all needs within reach.   Therapy Documentation Precautions:  Precautions Precautions: Fall, Cervical Precaution Comments: reviewed cervical precautions; orthostatic Required Braces or Orthoses: Cervical Brace Cervical Brace: Soft collar(collar can be removed for showering per Dr. Berline Chough) Restrictions Weight Bearing Restrictions: No Other Position/Activity Restrictions: collar at all times per  chart General:   Vital Signs:   Pain: Pt reports mild pain in L shoulder; applied heat pack and provided incrased rest breaks.   Therapy/Group: Individual Therapy  Angelina Pih 01/27/2020, 11:32 AM

## 2020-01-27 NOTE — Progress Notes (Signed)
Physical Therapy Session Note  Patient Details  Name: Anna Campos MRN: [id number] Date of Birth: [birth year]  Today's Date: 01/27/2020 PT Individual Time: 9476-5465 PT Individual Time Calculation (min): 48 min   Short Term Goals: Week 2:  PT Short Term Goal 1 (Week 2): Pt will complete transfers with min A consistently PT Short Term Goal 1 - Progress (Week 2): Met PT Short Term Goal 2 (Week 2): Pt will ambulate x 100 ft with assist x 1 consistently PT Short Term Goal 2 - Progress (Week 2): Met PT Short Term Goal 3 (Week 2): Pt will maintain dynamic standing balance with min A consistently PT Short Term Goal 3 - Progress (Week 2): Met Week 3:  PT Short Term Goal 1 (Week 3): =LTG due to ELOS  Skilled Therapeutic Interventions/Progress Updates:    Functional bed mobility to come to EOB with supervision using bed rails for support. Assist to don shoes seated EOB. Multiple attempts for sit > stands ultimately requiring min assist to facilitate anterior weightshift and power up. Used PFRW for transfers and gait this session with overall min assist to CGA for balance. Focused on functional gait and transfers outdoors over uneven surfaces and transfer to regular bench outside (mod assist for sit > stand due to lower surface) for community mobility training and d/c planning discussed during rest breaks in regards to goals. Pt performed gait over uneven surfaces with overall min assist due to mild LOBs, decreased strength in UE's to control PFRW over uneven sections, and inconsistent step length > 150' each trial. Gait in hallway with CGA overall with occasional min assist during turns with PFRW for LOB and decreased control of PFRW. Transferred to recliner at end of session with all needs in reach with CGA for navigating a tight space to simulate home environment.   Pt denies symptoms of orthostasis throughout session. ACE wraps, Tedhose and abdominal binder donned throughout.  Therapy  Documentation Precautions:  Precautions Precautions: Fall, Cervical Precaution Comments: reviewed cervical precautions; orthostatic Required Braces or Orthoses: Cervical Brace Cervical Brace: Soft collar(collar can be removed for showering per Dr. Dagoberto Ligas) Restrictions Weight Bearing Restrictions: No Other Position/Activity Restrictions: collar at all times per chart  Pain:  Denies pain.    Therapy/Group: Individual Therapy  Canary Brim Ivory Broad, PT, DPT, CBIS  01/27/2020, 3:17 PM

## 2020-01-28 ENCOUNTER — Inpatient Hospital Stay (HOSPITAL_COMMUNITY): Payer: Commercial Managed Care - PPO

## 2020-01-28 ENCOUNTER — Inpatient Hospital Stay (HOSPITAL_COMMUNITY): Payer: Commercial Managed Care - PPO | Admitting: Physical Therapy

## 2020-01-28 NOTE — Progress Notes (Signed)
Physical Therapy Session Note  Patient Details  Name: Anna Campos MRN: [id number] Date of Birth: [birth year]  Today's Date: 01/28/2020 PT Individual Time: 1120-1203 PT Individual Time Calculation (min): 43 min   Short Term Goals: Week 2:  PT Short Term Goal 1 (Week 2): Pt will complete transfers with min A consistently PT Short Term Goal 1 - Progress (Week 2): Met PT Short Term Goal 2 (Week 2): Pt will ambulate x 100 ft with assist x 1 consistently PT Short Term Goal 2 - Progress (Week 2): Met PT Short Term Goal 3 (Week 2): Pt will maintain dynamic standing balance with min A consistently PT Short Term Goal 3 - Progress (Week 2): Met  Skilled Therapeutic Interventions/Progress Updates: Pt presents sitting in recliner, ready fro therapy.  Pt required assist to remove slipper socks and don shoes.  Pt transferred sit to stand w/ min A and verbal cues for forward lean.  Pt performed SPT recliner to w/c w/ min A.  Pt amb multiple trials w/ platform walker and CGA  Pt amb 150-250' including turns to return to room.  Pt required verbal cues to decrease right step length to improve gait cycle.  Pt performed Nu-step at Level 4 x 11 min w.o c/o.  Pt requires occasional re-positioning of right LE on pedal.  Pt returned to recliner w/ seat alarm on and all needs in reach.     Therapy Documentation Precautions:  Precautions Precautions: Fall, Cervical Precaution Comments: reviewed cervical precautions; orthostatic Required Braces or Orthoses: Cervical Brace Cervical Brace: Soft collar(collar can be removed for showering per Dr. Dagoberto Ligas) Restrictions Weight Bearing Restrictions: No Other Position/Activity Restrictions: collar at all times per chart General:   Vital Signs:   Pain:  no c/o pain during rx.    Therapy/Group: Individual Therapy  Ladoris Gene 01/28/2020, 12:30 PM

## 2020-01-28 NOTE — Progress Notes (Signed)
Occupational Therapy Session Note  Patient Details  Name: Anna Campos MRN: [id number] Date of Birth: 1965-12-05  Today's Date: 01/28/2020 OT Individual Time: 0800-0900 OT Individual Time Calculation (min): 60 min    Short Term Goals: Week 3:  OT Short Term Goal 1 (Week 3): Pt will complete UB dressing with min assist for pullover shirt only. OT Short Term Goal 2 (Week 3): Pt will complete LB dressing with MOD A for pants only OT Short Term Goal 3 (Week 3): Pt will perform seated grooming task with MIN A for more than 2 consecutive sessions  Skilled Therapeutic Interventions/Progress Update  Pt received seated in recliner finishing up breakfast agreeable to OT intervention. Pt reports 2/10 pain in R shoulder requesting hot pack. Session focus on BADLs and functional mobility to increase functional independence. Pt requires MINA for stand pivot transfer from recliner<>w/c with no AD. Pt completed seated UB bathing with set- up assist to switch bath mit from R<>L hand. Pt excited about being able to wash both arms this session. Worked on doffing pants in standing as precursor for higher level toileting tasks. Pt able to complete task with MIN A for balance and increased time. Pt completed x3 sit<>stand during session with MIN A using PFRW. Pt completed UB dressing with MIN A needing assist only to get neck of shirt over soft collar. MAX A for LB dressing d/t time mgmt. Pt required set- up assist for oral care from sitting and MAX A to don TEDs. No OH s/s noted during session. Pt left seated in recliner with chair alarm activated, hot pack in place and all needs within reach.   Therapy Documentation Precautions:  Precautions Precautions: Fall, Cervical Precaution Comments: reviewed cervical precautions; orthostatic Required Braces or Orthoses: Cervical Brace Cervical Brace: Soft collar(collar can be removed for showering per Dr. Berline Chough) Restrictions Weight Bearing Restrictions: No Other  Position/Activity Restrictions: collar at all times per chart General:   Vital Signs:   Pain: Pt reports 2/10 pain in R shoulder ; applied  hot pack as pain mgmt strategy.  Therapy/Group: Individual Therapy  Angelina Pih 01/28/2020, 10:48 AM

## 2020-01-28 NOTE — Progress Notes (Signed)
[place] PHYSICAL MEDICINE & REHABILITATION PROGRESS NOTE   Subjective/Complaints:  Pt reports Anna Campos was really helpful for muscle tightness/pain of neck/shoulders- wants him a few more times.  Also, pain better controlled- got to sleep and stayed asleep due to improved pain-  LBM 2 days ago- used to going every 2-3 days, but encouraged her to go soon, don't want her constipated.      ROS:   Pt denies SOB, abd pain, CP, N/V/C/D, and vision changes   Objective:   No results found. No results for input(s): WBC, HGB, HCT, PLT in the last 72 hours. No results for input(s): NA, K, CL, CO2, GLUCOSE, BUN, CREATININE, CALCIUM in the last 72 hours.  Intake/Output Summary (Last 24 hours) at 01/28/2020 1707 Last data filed at 01/28/2020 1245 Gross per 24 hour  Intake 760 ml  Output --  Net 760 ml     Physical Exam: Vital Signs Blood pressure (!) 88/62, pulse 80, temperature 98.7 F (37.1 C), resp. rate 18, height 5\' 9"  (1.753 m), weight 54.2 kg, SpO2 97 %.  Constitutional: sitting up in bedside chair, OT in room, NAD; denies orthostasis HEENT: chronic- missing all teeth;  Neck: wearing soft collar Cardiovascular: RRR- no JVD Respiratory/Chest: CTA B/L- no accessory muscle use GI/Abdomen:soft, NT, ND, (+)BS hypoactive still Ext: no clubbing, cyanosis, or edema Psych: smiling- sweet Musc: no LE edema Neuro: Ox3 Motor: Muscles in upper traps, levators, and scalenes looser, but not loose yet RUE: Shoulder abduction 2+/5, elbow flexion/extension 3+/5, handgrip 2/5,  Finger abd 1/5 LUE: Shoulder abduction 2+/5 , elbow flexion/extension 3/5, and grip 3-/5; finger abd 1/5 Left lower extremity: 4/5 proximal distal Right lower extremity: Hip flexion, knee extension 3 to 3+/5, ankle dorsiflexion 3/5  Assessment/Plan: 1. Functional deficits secondary to C5 ASIA D due to osteomyelitis which require 3+ hours per day of interdisciplinary therapy in a comprehensive inpatient rehab  setting.  Physiatrist is providing close team supervision and 24 hour management of active medical problems listed below.  Physiatrist and rehab team continue to assess barriers to discharge/monitor patient progress toward functional and medical goals  Care Tool:  Bathing    Body parts bathed by patient: Right arm, Left arm, Chest, Face   Body parts bathed by helper: Right lower leg, Left lower leg     Bathing assist Assist Level: Set up assist     Upper Body Dressing/Undressing Upper body dressing   What is the patient wearing?: Pull over shirt    Upper body assist Assist Level: Minimal Assistance - Patient > 75%    Lower Body Dressing/Undressing Lower body dressing      What is the patient wearing?: Pants, Underwear/pull up     Lower body assist Assist for lower body dressing: Maximal Assistance - Patient 25 - 49%     Toileting Toileting    Toileting assist Assist for toileting: Total Assistance - Patient < 25%     Transfers Chair/bed transfer  Transfers assist     Chair/bed transfer assist level: Minimal Assistance - Patient > 75%     Locomotion Ambulation   Ambulation assist      Assist level: Contact Guard/Touching assist Assistive device: Walker-platform Max distance: 250'   Walk 10 feet activity   Assist     Assist level: Contact Guard/Touching assist Assistive device: Walker-platform   Walk 50 feet activity   Assist Walk 50 feet with 2 turns activity did not occur: Safety/medical concerns  Assist level: Contact Guard/Touching assist Assistive device:  Walker-platform    Walk 150 feet activity   Assist Walk 150 feet activity did not occur: Safety/medical concerns  Assist level: Contact Guard/Touching assist Assistive device: Walker-platform    Walk 10 feet on uneven surface  activity   Assist Walk 10 feet on uneven surfaces activity did not occur: Safety/medical concerns   Assist level: Minimal Assistance - Patient >  75% Assistive device: Walker-platform   Wheelchair     Assist Will patient use wheelchair at discharge?: Yes Type of Wheelchair: Manual    Wheelchair assist level: Supervision/Verbal cueing Max wheelchair distance: 5'    Wheelchair 50 feet with 2 turns activity    Assist        Assist Level: Dependent - Patient 0%   Wheelchair 150 feet activity     Assist      Assist Level: Dependent - Patient 0%   Blood pressure (!) 88/62, pulse 80, temperature 98.7 F (37.1 C), resp. rate 18, height 5\' 9"  (1.753 m), weight 54.2 kg, SpO2 97 %.  Medical Problem List and Plan: 1.  Quadriplegia secondary to C5 incomplete ASIA C initially, now D spinal cord injury.S/P C3-4 4-55-6 and C6-7 anterior cervical decompression and fusion 01/04/2020.  Cervical brace as directed  Continue CIR   -advised her to discuss dc date with her primary team this week  4/5- pt asking to leave early- after d/w pt risks/benefits of going/staying, she decided wanted to wait til 4/17 to leave- strength slightly improving.  2.  Antithrombotics/pulmonary emboli per CT angiogram of chest 12/20/2019: Lower extremity Doppler negative.   -DVT/anticoagulation: Intravenous heparin transitioned to Eliquis 01/11/2020             -antiplatelet therapy: N/A 3. Pain Management: Oxycodone and Robaxin as needed  4/8- pt reports pain not controlled anymore- used to be- but burning and aching- esp in neck and hips and back- will try to add tramadol 100 mg q8 hours for pain- also counseled can take 10 mg oxy IR-   4/9- pain MUCH better controlled- although would benefit from TrP injections, hates needles- wants TOM to do myofascial release- con't tramadol/oxy IR 4. Mood: Provide emotional support             -antipsychotic agents: N/A 5. Neuropsych: This patient is capable of making decisions on her own behalf. 6. Skin/Wound Care: Routine skin checks 7. Fluids/Electrolytes/Nutrition: Routine in and outs 8.  Anemia.  EGD  completed 12/27/2019 per GI services.  Recommend PPI x2 months.  H. pylori serology positive.  Complete course of amoxicillin and Biaxin  Hemoglobin 11.0 on 3/24   4/1- pt wants iron- will start Ferrous gluconate  4/2- tolerating iron so far- had bowel program last night for some reason will verify with nursing.   4/5- off bowel program- going daily most days   Continue to monitor 9. Multiple dental caries.  Follow-up dental services after multiple extractions 12/30/2019 10. Neurogenic bowel due to SCI.    Continue bowel program  3/29 - will change to QOD per pt's wishes/bowel habits  3/30- pt now able to control BMs so will stop bowel program  4/8- will increase senokot-s to 2 tabs BID and d/c Iron since so constipated, and adding Tramadol  4/9- LBM 2 nights ago- con't meds- hopeful she won't get backed up again. 11 Streptococcal bacteremia.  Intravenous ceftriaxone completed. 12.  History of tobacco use.  Counseling 13. Spasticity- improved since surgery- for now, doesn't need spasticity agents-  14. Neurogenic bladder/frequency  Pt  was started on Flomax- working well- no caths required in last 24 hours- will monitor.   PVRs with low volumes  Urinary frequency improving, cont Enablex 7.5 on 3/27  3/30- voiding well- PVRs look great- will stop PVRs after makes sure not retaining with cessation of flomax- con't Enablex- for urgency- will stop Flomax and make sure she's not retaining.   4/2- not retaining- stopped PVRs 15.  Hyponatremia  Sodium 133 on 3/24  16.  Transaminitis  LFTs elevated on 3/24   3/29- LFTs increase has resolved 17. Orthostatic hypotension  4/1- will add TEDS, abd binder and Florinef 0.1 mg daily since was so low and still low this AM  4/2- advised pt needs to continue Florinef- Sx's due to low BP, NOT medicine- clammy/dizzy- will con't  4/4 BP's look good again today  4/5- Having Bps of 90/50s with PT with standing- asymptomatic- will always be low, but if doesn't  need more meds, won't increase.   4/6- they are trying to use abd binder, florinef and ACE wraps- encouraged pt if no Sx's, to go without ACE wraps since they are uncomfortable sometimes.   4/7- explained to team, if no Sx's, no worries over low BP- also moved florinef to 6:30am so gets before AM therapy.  4/9- asymptomatic with low BP this AM- con't florinef 18. Sexuality/intimacy in SCI patient  4/8- discussed for 15 minutes about positions, sensation, orgasms and how to accomplish those topics.  19. Dispo  4/8- also advised no EtOH on pain meds- once off meds, can have ONE drink total in 24 hour period.    LOS: 17 days A FACE TO FACE EVALUATION WAS PERFORMED  Anna Campos 01/28/2020, 5:07 PM

## 2020-01-28 NOTE — Progress Notes (Signed)
Occupational Therapy Session Note  Patient Details  Name: Anna Campos MRN: [id number] Date of Birth: 1966-09-07  Today's Date: 01/28/2020 OT Individual Time: 1000-1030 OT Individual Time Calculation (min): 30 min    Short Term Goals: Week 3:  OT Short Term Goal 1 (Week 3): Pt will complete UB dressing with min assist for pullover shirt only. OT Short Term Goal 2 (Week 3): Pt will complete LB dressing with MOD A for pants only OT Short Term Goal 3 (Week 3): Pt will perform seated grooming task with MIN A for more than 2 consecutive sessions  Skilled Therapeutic Interventions/Progress Updates:  Pt received seated in recliner agreeable to OT intervention. Session focus on BUE [facility] for higher level ADL tasks. Pt completed seated clothespins therapeutic exercise needing increased time and effort to complete 10 clips. Noted gross grasp utilized versus pincer grasp. Noted pt utilized compensatory method of using lap to translate clothespin in hands. Pt requested to apply lotion bilaterally. Pt required set- up assist to open lotion and apply into hands. Pt left seated in recliner with all needs within reach and legs elevated.   Therapy Documentation Precautions:  Precautions Precautions: Fall, Cervical Precaution Comments: reviewed cervical precautions; orthostatic Required Braces or Orthoses: Cervical Brace Cervical Brace: Soft collar(collar can be removed for showering per Dr. Berline Chough) Restrictions Weight Bearing Restrictions: No Other Position/Activity Restrictions: collar at all times per chart General:   Vital Signs:   Pain: Pt reports mild pain in middle of back; applied hot pack as pain intervention strategy.   Therapy/Group: Individual Therapy  Angelina Pih 01/28/2020, 10:49 AM

## 2020-01-28 NOTE — Progress Notes (Signed)
Occupational Therapy Session Note  Patient Details  Name: Anna Campos MRN: [id number] Date of Birth: April 17, 1966  Today's Date: 01/28/2020 OT Individual Time: 0215-0330 OT Individual Time Calculation (min): 75 min    Short Term Goals: Week 3:  OT Short Term Goal 1 (Week 3): Pt will complete UB dressing with min assist for pullover shirt only. OT Short Term Goal 2 (Week 3): Pt will complete LB dressing with MOD A for pants only OT Short Term Goal 3 (Week 3): Pt will perform seated grooming task with MIN A for more than 2 consecutive sessions  Skilled Therapeutic Interventions/Progress Updates:  Pt received seated in recliner with TED hose and ABD binder donned agreeable to OT intervention.Pt with no s/s of [place] during session. Pt complete stand pivot transfer to w/c with MIN A and no AD. Pt transported outside with total A for time mgmt to work on community re-entry skills via functional with PFRW, BUE therex, and LB therex. Pt completed functional mobility greater than a household distance with PFRW and CGA- MIN A. Pt able to maneuver PFRW over uneven surfaces and advance walker over obstacles. Pt complete x2 sit<>stands with PFRW from various surface heights on outside benches. Pt complete 2 sets of x10 modified squats with CGA for balance as well as seated knee flexion/ extension to kick ball back and forth with OTA. Pt noted to have difficulty pulling RLE back after kicking ball needing increased time to reposition LE. Pt completed 2 sets x10 reps each of shoulder level chest press, elbow flexion/ extension and shoulder flexion ( ~ 80 degrees only) with boom whacker. Pt able to ambulate back inside and advance PFRW into elevator with MINA - CGA for balance.Pt reports increased pain in L shoulder once off elevator needing to sit down. Pt transported back to room in w/c with total A. Pt requested to return to bed; MIN A for stand pivot transfer with no AD. Pt completed bed mobility with supervision. Pt  left supine in bed with all needs within reach and bed alarm activated.   Therapy Documentation Precautions:  Precautions Precautions: Fall, Cervical Precaution Comments: reviewed cervical precautions; orthostatic Required Braces or Orthoses: Cervical Brace Cervical Brace: Soft collar(collar can be removed for showering per Dr. Berline Chough) Restrictions Weight Bearing Restrictions: No Other Position/Activity Restrictions: collar at all times per chart General:   Vital Signs: Therapy Vitals Temp: 98.7 F (37.1 C) Pulse Rate: 80 Resp: 18 BP: (!) 88/62 Patient Position (if appropriate): Sitting Oxygen Therapy SpO2: 97 % O2 Device: Room Air Pain: Pt reports pain in L shoulder; offered seated rest breaks and light ROM as pain mgmt strategy.   Therapy/Group: Individual Therapy  Angelina Pih 01/28/2020, 3:55 PM

## 2020-01-29 ENCOUNTER — Inpatient Hospital Stay (HOSPITAL_COMMUNITY): Payer: Commercial Managed Care - PPO | Admitting: Occupational Therapy

## 2020-01-29 NOTE — Progress Notes (Signed)
[place] PHYSICAL MEDICINE & REHABILITATION PROGRESS NOTE   Subjective/Complaints: Asks if she is due for next pain medication. Pain has been pretty well controlled. Having regular BM. Had one last night. Sleeping well at night.  BP 93/71, temp 99.1, vitals otherwise stable.   ROS:  Pt denies SOB, abd pain, CP, N/V/C/D, and vision changes  Objective:   No results found. No results for input(s): WBC, HGB, HCT, PLT in the last 72 hours. No results for input(s): NA, K, CL, CO2, GLUCOSE, BUN, CREATININE, CALCIUM in the last 72 hours.  Intake/Output Summary (Last 24 hours) at 01/29/2020 1754 Last data filed at 01/29/2020 1300 Gross per 24 hour  Intake 960 ml  Output --  Net 960 ml     Physical Exam: Vital Signs Blood pressure 93/71, pulse 89, temperature 99.1 F (37.3 C), resp. rate 16, height 5\' 9"  (1.753 m), weight 55.2 kg, SpO2 100 %.  Constitutional: sitting up in bedside chair; denies orthostasis with sit to stand HEENT: chronic- missing all teeth;  Neck: wearing soft collar Cardiovascular: RRR- no JVD Respiratory/Chest: CTA B/L- no accessory muscle use GI/Abdomen:soft, NT, ND, (+)BS hypoactive still Ext: no clubbing, cyanosis, or edema Psych: smiling- sweet Musc: no LE edema Neuro: Ox3 Motor: Muscles in upper traps, levators, and scalenes looser, but not loose yet RUE: Shoulder abduction 2+/5, elbow flexion/extension 3+/5, handgrip 2/5,  Finger abd 1/5 LUE: Shoulder abduction 2+/5 , elbow flexion/extension 3/5, and grip 3-/5; finger abd 1/5 Left lower extremity: 4/5 proximal distal Right lower extremity: Hip flexion, knee extension 3 to 3+/5, ankle dorsiflexion 3/5  Assessment/Plan: 1. Functional deficits secondary to C5 ASIA D due to osteomyelitis which require 3+ hours per day of interdisciplinary therapy in a comprehensive inpatient rehab setting.  Physiatrist is providing close team supervision and 24 hour management of active medical problems listed  below.  Physiatrist and rehab team continue to assess barriers to discharge/monitor patient progress toward functional and medical goals  Care Tool:  Bathing    Body parts bathed by patient: Right arm, Left arm, Chest, Face   Body parts bathed by helper: Right lower leg, Left lower leg     Bathing assist Assist Level: Set up assist     Upper Body Dressing/Undressing Upper body dressing   What is the patient wearing?: Pull over shirt    Upper body assist Assist Level: Minimal Assistance - Patient > 75%    Lower Body Dressing/Undressing Lower body dressing      What is the patient wearing?: Pants, Underwear/pull up     Lower body assist Assist for lower body dressing: Maximal Assistance - Patient 25 - 49%     Toileting Toileting    Toileting assist Assist for toileting: Total Assistance - Patient < 25%     Transfers Chair/bed transfer  Transfers assist     Chair/bed transfer assist level: Minimal Assistance - Patient > 75%     Locomotion Ambulation   Ambulation assist      Assist level: Contact Guard/Touching assist Assistive device: Walker-platform Max distance: 250'   Walk 10 feet activity   Assist     Assist level: Contact Guard/Touching assist Assistive device: Walker-platform   Walk 50 feet activity   Assist Walk 50 feet with 2 turns activity did not occur: Safety/medical concerns  Assist level: Contact Guard/Touching assist Assistive device: Walker-platform    Walk 150 feet activity   Assist Walk 150 feet activity did not occur: Safety/medical concerns  Assist level: Contact Guard/Touching assist  Assistive device: Walker-platform    Walk 10 feet on uneven surface  activity   Assist Walk 10 feet on uneven surfaces activity did not occur: Safety/medical concerns   Assist level: Minimal Assistance - Patient > 75% Assistive device: Walker-platform   Wheelchair     Assist Will patient use wheelchair at discharge?:  Yes Type of Wheelchair: Manual    Wheelchair assist level: Supervision/Verbal cueing Max wheelchair distance: 52'    Wheelchair 50 feet with 2 turns activity    Assist        Assist Level: Dependent - Patient 0%   Wheelchair 150 feet activity     Assist      Assist Level: Dependent - Patient 0%   Blood pressure 93/71, pulse 89, temperature 99.1 F (37.3 C), resp. rate 16, height 5\' 9"  (1.753 m), weight 55.2 kg, SpO2 100 %.  Medical Problem List and Plan: 1.  Quadriplegia secondary to C5 incomplete ASIA C initially, now D spinal cord injury.S/P C3-4 4-55-6 and C6-7 anterior cervical decompression and fusion 01/04/2020.  Cervical brace as directed  Continue CIR   -advised her to discuss dc date with her primary team this week  4/5- pt asking to leave early- after d/w pt risks/benefits of going/staying, she decided wanted to wait til 4/17 to leave- strength slightly improving.  2.  Antithrombotics/pulmonary emboli per CT angiogram of chest 12/20/2019: Lower extremity Doppler negative.   -DVT/anticoagulation: Intravenous heparin transitioned to Eliquis 01/11/2020             -antiplatelet therapy: N/A 3. Pain Management: Oxycodone and Robaxin as needed  4/8- pt reports pain not controlled anymore- used to be- but burning and aching- esp in neck and hips and back- will try to add tramadol 100 mg q8 hours for pain- also counseled can take 10 mg oxy IR-   4/9- pain MUCH better controlled- although would benefit from TrP injections, hates needles- wants TOM to do myofascial release- con't tramadol/oxy IR  4/10: pain continues to be well controlled.  4. Mood: Provide emotional support             -antipsychotic agents: N/A 5. Neuropsych: This patient is capable of making decisions on her own behalf. 6. Skin/Wound Care: Routine skin checks 7. Fluids/Electrolytes/Nutrition: Routine in and outs 8.  Anemia.  EGD completed 12/27/2019 per GI services.  Recommend PPI x2 months.  H. pylori  serology positive.  Complete course of amoxicillin and Biaxin  Hemoglobin 11.0 on 3/24   4/1- pt wants iron- will start Ferrous gluconate  4/2- tolerating iron so far- had bowel program last night for some reason will verify with nursing.   4/5- off bowel program- going daily most days   Continue to monitor 9. Multiple dental caries.  Follow-up dental services after multiple extractions 12/30/2019 10. Neurogenic bowel due to SCI.    Continue bowel program  3/29 - will change to QOD per pt's wishes/bowel habits  3/30- pt now able to control BMs so will stop bowel program  4/8- will increase senokot-s to 2 tabs BID and d/c Iron since so constipated, and adding Tramadol  4/9- LBM 2 nights ago- con't meds- hopeful she won't get backed up again.  4/10: BM last night as well 11 Streptococcal bacteremia.  Intravenous ceftriaxone completed. 12.  History of tobacco use.  Counseling 13. Spasticity- improved since surgery- for now, doesn't need spasticity agents-  14. Neurogenic bladder/frequency  Pt was started on Flomax- working well- no caths required in last  24 hours- will monitor.   PVRs with low volumes  Urinary frequency improving, cont Enablex 7.5 on 3/27  3/30- voiding well- PVRs look great- will stop PVRs after makes sure not retaining with cessation of flomax- con't Enablex- for urgency- will stop Flomax and make sure she's not retaining.   4/2- not retaining- stopped PVRs 15.  Hyponatremia  Sodium 133 on 3/24  16.  Transaminitis  LFTs elevated on 3/24   3/29- LFTs increase has resolved 17. Orthostatic hypotension  4/1- will add TEDS, abd binder and Florinef 0.1 mg daily since was so low and still low this AM  4/2- advised pt needs to continue Florinef- Sx's due to low BP, NOT medicine- clammy/dizzy- will con't  4/4 BP's look good again today  4/5- Having Bps of 90/50s with PT with standing- asymptomatic- will always be low, but if doesn't need more meds, won't increase.   4/6- they  are trying to use abd binder, florinef and ACE wraps- encouraged pt if no Sx's, to go without ACE wraps since they are uncomfortable sometimes.   4/7- explained to team, if no Sx's, no worries over low BP- also moved florinef to 6:30am so gets before AM therapy.  4/9- asymptomatic with low BP this AM- con't florinef  4/10: continues to be asymptomatic with low BP 18. Sexuality/intimacy in SCI patient  4/8- discussed for 15 minutes about positions, sensation, orgasms and how to accomplish those topics.  19. Dispo  4/8- also advised no EtOH on pain meds- once off meds, can have ONE drink total in 24 hour period.    LOS: 18 days A FACE TO FACE EVALUATION WAS PERFORMED  Drema Pry Mida Cory 01/29/2020, 5:54 PM

## 2020-01-29 NOTE — Progress Notes (Signed)
Occupational Therapy Session Note  Patient Details  Name: Anna Campos MRN: [id number] Date of Birth: 05-08-1966  Today's Date: 01/29/2020 OT Individual Time: 4174-0814 OT Individual Time Calculation (min): 77 min    Short Term Goals: Week 3:  OT Short Term Goal 1 (Week 3): Pt will complete UB dressing with min assist for pullover shirt only. OT Short Term Goal 2 (Week 3): Pt will complete LB dressing with MOD A for pants only OT Short Term Goal 3 (Week 3): Pt will perform seated grooming task with MIN A for more than 2 consecutive sessions  Skilled Therapeutic Interventions/Progress Updates: Patient's husband present for a small portion of the session.   She participated as follows:  - denied interest in kitchen skills and stated, "I will not be cooking, but if we have to and you need me to go to the kitchen, I will." - toilet transfer recliner chair to 3:1 in her bathroom via bilateral platform walker with close supervision.     -toileting = moderate asssistance and extra time to pull up Depends and pants separately,taking turns with each upper exgtremity while standing at platform walker.   Otherwise, with moderate assistance utilized right upper extremity to wipe (lacked coorfdination, strength and dexterity in fingers to thoroughly wipe but was able to reach buttrocks)      -wash buttocks after BM=max assist due to decreased hand strength and decreased dynamic balance for one handed acivity and fatigue in bilateral legs to maintain safe dynamic standing balance - wall pushups to increase core strength, propriceptive feedback and neur reducation in upper extremities= Min A standing about 2.5 feet from wall.   She maintained very good standing balance for this actitivity and stated she felt muscle activitation in both arms.  -other dynamic standing balance activities - due to decreased bilateral hand and digital strength, dexterity, coordination and wrist AROM, she participated in various  hand and wrist activities.  Patient requested her abdominal binder be tightened and was she was able to complete return to lying supine in bed transfers with CGA and she completed lateral rolls with extra time due to c/o fatigue but with modified independence.  She was left seated in her w/c behind her bed with call bell in place and husband sitting nearby.   He was reminded to call nursing to re engage safety alarms before leavng patient room  Continue OTR Plan of Care  Therapy Documentation Precautions:  Precautions Precautions: Fall, Cervical Precaution Comments: reviewed cervical precautions; orthostatic Required Braces or Orthoses: Cervical Brace Cervical Brace: Soft collar(collar can be removed for showering per Dr. Berline Chough) Restrictions Weight Bearing Restrictions: No Other Position/Activity Restrictions: collar at all times per chart  Pain:denied  Therapy/Group: Individual Therapy  Rozelle Logan 01/29/2020, 5:20 PM

## 2020-01-30 ENCOUNTER — Inpatient Hospital Stay (HOSPITAL_COMMUNITY): Payer: Commercial Managed Care - PPO | Admitting: Occupational Therapy

## 2020-01-30 NOTE — Progress Notes (Signed)
Occupational Therapy Session Note  Patient Details  Name: Anna Campos MRN: [id number] Date of Birth: [birth year]  Today's Date: 01/30/2020 OT Individual Time: 0702-0810 OT Individual Time Calculation (min): 68 min    Short Term Goals: Week 3:  OT Short Term Goal 1 (Week 3): Pt will complete UB dressing with min assist for pullover shirt only. OT Short Term Goal 2 (Week 3): Pt will complete LB dressing with MOD A for pants only OT Short Term Goal 3 (Week 3): Pt will perform seated grooming task with MIN A for more than 2 consecutive sessions  Skilled Therapeutic Interventions/Progress Updates:    Upon entering the room, pt seated in recliner chair with thigh high TED hose donned. Pt reports, " I haven't been wearing the wraps the last few days." Total A to don abdominal binder with BP of 89/78 and pt asymptomatic. Pt requesting to eat breakfast this morning with set up given from therapist to open containers and cut food as needed. Foam utensil grips places on fork and spoon and pt able to feed self with L UE and with minimal spillage with increased time. Pt does report pain in L shoulder after eating and holding self erect on edge of chair. One time use hot pack applied per pt request. Pt reports feeling better with heat and no skin concerns with heat applied. All needs within reach and chair alarm activated.   Therapy Documentation Precautions:  Precautions Precautions: Fall, Cervical Precaution Comments: reviewed cervical precautions; orthostatic Required Braces or Orthoses: Cervical Brace Cervical Brace: Soft collar(collar can be removed for showering per Dr. Berline Chough) Restrictions Weight Bearing Restrictions: No Other Position/Activity Restrictions: collar at all times per chart General:   Vital Signs: Therapy Vitals Temp: 98 F (36.7 C) Pulse Rate: 73 Resp: 16 BP: 101/65 Patient Position (if appropriate): Lying Oxygen Therapy SpO2: 100 % O2 Device: Room Air Pain:    ADL: ADL Equipment Provided: Feeding equipment Eating: Maximal assistance Where Assessed-Eating: Chair Grooming: Maximal assistance Where Assessed-Grooming: Wheelchair Upper Body Bathing: Maximal assistance Where Assessed-Upper Body Bathing: Wheelchair Lower Body Bathing: Dependent Where Assessed-Lower Body Bathing: Sitting at sink, Standing at sink, Wheelchair Upper Body Dressing: Maximal assistance Where Assessed-Upper Body Dressing: Wheelchair, Sitting at sink Lower Body Dressing: Dependent Where Assessed-Lower Body Dressing: Wheelchair Toileting: Dependent Where Assessed-Toileting: Bedside Commode Toilet Transfer: Dependent Toilet Transfer Method: Forensic scientist Equipment: Bedside commode   Therapy/Group: Individual Therapy  Alen Bleacher 01/30/2020, 8:44 AM

## 2020-01-30 NOTE — Plan of Care (Signed)
  Problem: Consults Goal: RH SPINAL CORD INJURY PATIENT EDUCATION Description:  See Patient Education module for education specifics.  Outcome: Progressing   Problem: SCI BOWEL ELIMINATION Goal: RH STG MANAGE BOWEL WITH ASSISTANCE Description: STG Manage Bowel with mod I Assistance. Outcome: Progressing   Problem: SCI BLADDER ELIMINATION Goal: RH STG MANAGE BLADDER WITH ASSISTANCE Description: STG Manage Bladder With Mod I Assistance Outcome: Progressing   Problem: RH SKIN INTEGRITY Goal: RH STG MAINTAIN SKIN INTEGRITY WITH ASSISTANCE Description: STG Maintain Skin Integrity With mod I Assistance. Outcome: Progressing   Problem: RH SAFETY Goal: RH STG ADHERE TO SAFETY PRECAUTIONS W/ASSISTANCE/DEVICE Description: STG Adhere to Safety Precautions With cues and reminders  Outcome: Progressing Goal: RH STG DECREASED RISK OF FALL WITH ASSISTANCE Description: STG Decreased Risk of Fall With mod I Assistance. Outcome: Progressing   Problem: RH PAIN MANAGEMENT Goal: RH STG PAIN MANAGED AT OR BELOW PT'S PAIN GOAL Description: Pain level less than 4 on scale of 0-10 Outcome: Progressing   Problem: RH KNOWLEDGE DEFICIT SCI Goal: RH STG INCREASE KNOWLEDGE OF SELF CARE AFTER SCI Description: Pt will be able to manage bowel program with min assist from family. Pt will be able to adhere to medication regimen with mod I assist from family.  Outcome: Progressing   

## 2020-01-30 NOTE — Progress Notes (Signed)
[facility] PHYSICAL MEDICINE & REHABILITATION PROGRESS NOTE   Subjective/Complaints: No complaints this morning.  Eating lunch. Friend is visiting. Excited to be going home next week.   ROS:  Pt denies SOB, abd pain, CP, N/V/C/D, and vision changes  Objective:   No results found. No results for input(s): WBC, HGB, HCT, PLT in the last 72 hours. No results for input(s): NA, K, CL, CO2, GLUCOSE, BUN, CREATININE, CALCIUM in the last 72 hours.  Intake/Output Summary (Last 24 hours) at 01/30/2020 1605 Last data filed at 01/30/2020 1330 Gross per 24 hour  Intake 800 ml  Output --  Net 800 ml     Physical Exam: Vital Signs Blood pressure 100/62, pulse 86, temperature 98.6 F (37 C), resp. rate 18, height 5\' 9"  (1.753 m), weight 54.4 kg, SpO2 100 %.  Constitutional: sitting up in bedside chair; denies orthostasis with sit to stand HEENT: chronic- missing all teeth;  Neck: wearing soft collar Cardiovascular: RRR- no JVD Respiratory/Chest: CTA B/L- no accessory muscle use GI/Abdomen:soft, NT, ND, (+)BS hypoactive still Ext: no clubbing, cyanosis, or edema Psych: smiling- sweet, very pleasant mood.  Musc: no LE edema Neuro: Ox3 Motor: Muscles in upper traps, levators, and scalenes looser, but not loose yet RUE: Shoulder abduction 2+/5, elbow flexion/extension 3+/5, handgrip 2/5,  Finger abd 1/5 LUE: Shoulder abduction 2+/5 , elbow flexion/extension 3/5, and grip 3-/5; finger abd 1/5 Left lower extremity: 4/5 proximal distal Right lower extremity: Hip flexion, knee extension 3 to 3+/5, ankle dorsiflexion 3/5  Assessment/Plan: 1. Functional deficits secondary to C5 ASIA D due to osteomyelitis which require 3+ hours per day of interdisciplinary therapy in a comprehensive inpatient rehab setting.  Physiatrist is providing close team supervision and 24 hour management of active medical problems listed below.  Physiatrist and rehab team continue to assess barriers to  discharge/monitor patient progress toward functional and medical goals  Care Tool:  Bathing    Body parts bathed by patient: Right arm, Left arm, Chest, Face   Body parts bathed by helper: Right lower leg, Left lower leg     Bathing assist Assist Level: Set up assist     Upper Body Dressing/Undressing Upper body dressing   What is the patient wearing?: Pull over shirt    Upper body assist Assist Level: Minimal Assistance - Patient > 75%    Lower Body Dressing/Undressing Lower body dressing      What is the patient wearing?: Pants, Underwear/pull up     Lower body assist Assist for lower body dressing: Maximal Assistance - Patient 25 - 49%     Toileting Toileting    Toileting assist Assist for toileting: Total Assistance - Patient < 25%     Transfers Chair/bed transfer  Transfers assist     Chair/bed transfer assist level: Minimal Assistance - Patient > 75%     Locomotion Ambulation   Ambulation assist      Assist level: Contact Guard/Touching assist Assistive device: Walker-platform Max distance: 250'   Walk 10 feet activity   Assist     Assist level: Contact Guard/Touching assist Assistive device: Walker-platform   Walk 50 feet activity   Assist Walk 50 feet with 2 turns activity did not occur: Safety/medical concerns  Assist level: Contact Guard/Touching assist Assistive device: Walker-platform    Walk 150 feet activity   Assist Walk 150 feet activity did not occur: Safety/medical concerns  Assist level: Contact Guard/Touching assist Assistive device: Walker-platform    Walk 10 feet on uneven surface  activity   Assist Walk 10 feet on uneven surfaces activity did not occur: Safety/medical concerns   Assist level: Minimal Assistance - Patient > 75% Assistive device: Walker-platform   Wheelchair     Assist Will patient use wheelchair at discharge?: Yes Type of Wheelchair: Manual    Wheelchair assist level:  Supervision/Verbal cueing Max wheelchair distance: 62'    Wheelchair 50 feet with 2 turns activity    Assist        Assist Level: Dependent - Patient 0%   Wheelchair 150 feet activity     Assist      Assist Level: Dependent - Patient 0%   Blood pressure 100/62, pulse 86, temperature 98.6 F (37 C), resp. rate 18, height 5\' 9"  (1.753 m), weight 54.4 kg, SpO2 100 %.  Medical Problem List and Plan: 1.  Quadriplegia secondary to C5 incomplete ASIA C initially, now D spinal cord injury.S/P C3-4 4-55-6 and C6-7 anterior cervical decompression and fusion 01/04/2020.  Cervical brace as directed  Continue CIR   Precautions: Fall, cervical  -advised her to discuss dc date with her primary team this week  4/5- pt asking to leave early- after d/w pt risks/benefits of going/staying, she decided wanted to wait til 4/17 to leave- strength slightly improving.  2.  Antithrombotics/pulmonary emboli per CT angiogram of chest 12/20/2019: Lower extremity Doppler negative.   -DVT/anticoagulation: Intravenous heparin transitioned to Eliquis 01/11/2020             -antiplatelet therapy: N/A 3. Pain Management: Oxycodone and Robaxin as needed  4/8- pt reports pain not controlled anymore- used to be- but burning and aching- esp in neck and hips and back- will try to add tramadol 100 mg q8 hours for pain- also counseled can take 10 mg oxy IR-   4/9- pain MUCH better controlled- although would benefit from TrP injections, hates needles- wants TOM to do myofascial release- con't tramadol/oxy IR  4/11: pain continues to be well controlled.  4. Mood: Provide emotional support             -antipsychotic agents: N/A 5. Neuropsych: This patient is capable of making decisions on her own behalf. 6. Skin/Wound Care: Routine skin checks 7. Fluids/Electrolytes/Nutrition: Routine in and outs 8.  Anemia.  EGD completed 12/27/2019 per GI services.  Recommend PPI x2 months.  H. pylori serology positive.  Complete  course of amoxicillin and Biaxin  Hemoglobin 11.0 on 3/24   4/1- pt wants iron- will start Ferrous gluconate  4/2- tolerating iron so far- had bowel program last night for some reason will verify with nursing.   4/5- off bowel program- going daily most days   Continue to monitor 9. Multiple dental caries.  Follow-up dental services after multiple extractions 12/30/2019 10. Neurogenic bowel due to SCI.    Continue bowel program  3/29 - will change to QOD per pt's wishes/bowel habits  3/30- pt now able to control BMs so will stop bowel program  4/8- will increase senokot-s to 2 tabs BID and d/c Iron since so constipated, and adding Tramadol  4/9- LBM 2 nights ago- con't meds- hopeful she won't get backed up again.  4/10: BM last night as well 11 Streptococcal bacteremia.  Intravenous ceftriaxone completed. 12.  History of tobacco use.  Counseling 13. Spasticity- improved since surgery- for now, doesn't need spasticity agents-  14. Neurogenic bladder/frequency  Pt was started on Flomax- working well- no caths required in last 24 hours- will monitor.   PVRs with low  volumes  Urinary frequency improving, cont Enablex 7.5 on 3/27  3/30- voiding well- PVRs look great- will stop PVRs after makes sure not retaining with cessation of flomax- con't Enablex- for urgency- will stop Flomax and make sure she's not retaining.   4/2- not retaining- stopped PVRs 15.  Hyponatremia  Sodium 133 on 3/24  16.  Transaminitis  LFTs elevated on 3/24   3/29- LFTs increase has resolved 17. Orthostatic hypotension  4/1- will add TEDS, abd binder and Florinef 0.1 mg daily since was so low and still low this AM  4/2- advised pt needs to continue Florinef- Sx's due to low BP, NOT medicine- clammy/dizzy- will con't  4/4 BP's look good again today  4/5- Having Bps of 90/50s with PT with standing- asymptomatic- will always be low, but if doesn't need more meds, won't increase.   4/6- they are trying to use abd binder,  florinef and ACE wraps- encouraged pt if no Sx's, to go without ACE wraps since they are uncomfortable sometimes.   4/7- explained to team, if no Sx's, no worries over low BP- also moved florinef to 6:30am so gets before AM therapy.  4/9- asymptomatic with low BP this AM- con't florinef  4/11: continues to be asymptomatic with low BP. Continue use of TEDs and abdominal binder.  18. Sexuality/intimacy in SCI patient  4/8- discussed for 15 minutes about positions, sensation, orgasms and how to accomplish those topics.  19. Dispo  4/8- also advised no EtOH on pain meds- once off meds, can have ONE drink total in 24 hour period.    LOS: 19 days A FACE TO FACE EVALUATION WAS PERFORMED  Martha Clan P Darrin Apodaca 01/30/2020, 4:05 PM

## 2020-01-31 ENCOUNTER — Inpatient Hospital Stay (HOSPITAL_COMMUNITY): Payer: Commercial Managed Care - PPO

## 2020-01-31 ENCOUNTER — Inpatient Hospital Stay (HOSPITAL_COMMUNITY): Payer: Commercial Managed Care - PPO | Admitting: Physical Therapy

## 2020-01-31 NOTE — Plan of Care (Signed)
  Problem: Consults Goal: RH SPINAL CORD INJURY PATIENT EDUCATION Description:  See Patient Education module for education specifics.  Outcome: Progressing   Problem: SCI BOWEL ELIMINATION Goal: RH STG MANAGE BOWEL WITH ASSISTANCE Description: STG Manage Bowel with mod I Assistance. Outcome: Progressing   Problem: SCI BLADDER ELIMINATION Goal: RH STG MANAGE BLADDER WITH ASSISTANCE Description: STG Manage Bladder With Mod I Assistance Outcome: Progressing   Problem: RH SKIN INTEGRITY Goal: RH STG MAINTAIN SKIN INTEGRITY WITH ASSISTANCE Description: STG Maintain Skin Integrity With mod I Assistance. Outcome: Progressing   Problem: RH SAFETY Goal: RH STG ADHERE TO SAFETY PRECAUTIONS W/ASSISTANCE/DEVICE Description: STG Adhere to Safety Precautions With cues and reminders  Outcome: Progressing Goal: RH STG DECREASED RISK OF FALL WITH ASSISTANCE Description: STG Decreased Risk of Fall With mod I Assistance. Outcome: Progressing   Problem: RH PAIN MANAGEMENT Goal: RH STG PAIN MANAGED AT OR BELOW PT'S PAIN GOAL Description: Pain level less than 4 on scale of 0-10 Outcome: Progressing   Problem: RH KNOWLEDGE DEFICIT SCI Goal: RH STG INCREASE KNOWLEDGE OF SELF CARE AFTER SCI Description: Pt will be able to manage bowel program with min assist from family. Pt will be able to adhere to medication regimen with mod I assist from family.  Outcome: Progressing

## 2020-01-31 NOTE — Progress Notes (Signed)
Occupational Therapy Session Note  Patient Details  Name: Anna Campos MRN: [id number] Date of Birth: October 26, 1965  Today's Date: 01/31/2020 OT Individual Time: 0800-0900 OT Individual Time Calculation (min): 60 min    Short Term Goals: Week 3:  OT Short Term Goal 1 (Week 3): Pt will complete UB dressing with min assist for pullover shirt only. OT Short Term Goal 2 (Week 3): Pt will complete LB dressing with MOD A for pants only OT Short Term Goal 3 (Week 3): Pt will perform seated grooming task with MIN A for more than 2 consecutive sessions  Skilled Therapeutic Interventions/Progress Updates:  Pt received seated in recliner finishing up breakfast agreeable to OT intervention with thigh high TED hose and ABD binder donned. Pt with no s/s of OH throughout session. Pt requesting to change clothes and complete grooming tasks at sink. Pt complete functional mobility from recliner>w/c at sink with PFRW with CGA. Pt utilizes compensatory method of placing toothbrush in hole of sink and squeezing paste onto brush d/t decreased FMC. Education provided on using pump bottles at home for toothpaste as compensatory method d/t decreased FMC. Pt requires set- up assist to wash face to squeeze excess water out of wash cloth. Pt complete UB dressing from w/c needing MIN A to don neck of shirt over c collar. Pt requires MOD A for LB dressing; CGA for sit<>stand from w/c to pull pants up to waistline. Pt complete functional mobility back to recliner in similar fashion as previously indicated. Visual demo of TTB transfer with pt verbalizing understanding, plan to practice transfer during next session. Pt left seated in recliner with all needs within reach.   Therapy Documentation Precautions:  Precautions Precautions: Fall, Cervical Precaution Comments: reviewed cervical precautions; orthostatic Required Braces or Orthoses: Cervical Brace Cervical Brace: Soft collar(collar can be removed for showering per Dr.  Berline Chough) Restrictions Weight Bearing Restrictions: No Other Position/Activity Restrictions: collar at all times per chart General:   Vital Signs:   Pain: Pt reports no pain during session.   Therapy/Group: Individual Therapy  Angelina Pih 01/31/2020, 10:05 AM

## 2020-01-31 NOTE — Progress Notes (Signed)
Occupational Therapy Session Note  Patient Details  Name: Anna Campos MRN: [id number] Date of Birth: [birth year]  Today's Date: 01/31/2020 OT Individual Time: 1300-1325 OT Individual Time Calculation (min): 25 min    Short Term Goals: Week 3:  OT Short Term Goal 1 (Week 3): Pt will complete UB dressing with min assist for pullover shirt only. OT Short Term Goal 2 (Week 3): Pt will complete LB dressing with MOD A for pants only OT Short Term Goal 3 (Week 3): Pt will perform seated grooming task with MIN A for more than 2 consecutive sessions  Skilled Therapeutic Interventions/Progress Updates:    OT intervention with focus on BUE NMR including soft tissue mobilizations to increase pt's BUE AROM. Pt seated in recliner upon arrival with soft collar donned. Pt noted with L upper traps trigger points which responded well to treatment. Trigger points noted along medial border of B scapulae. Responded to treatment.  Pt remained in recliner with heat applied to traps. Seat alarm activated and all needs within reach.   Therapy Documentation Precautions:  Precautions Precautions: Fall, Cervical Precaution Comments: reviewed cervical precautions; orthostatic Required Braces or Orthoses: Cervical Brace Cervical Brace: Soft collar(collar can be removed for showering per Dr. Berline Chough) Restrictions Weight Bearing Restrictions: No Other Position/Activity Restrictions: collar at all times per chart Pain: Pain Assessment Pain Scale: 0-10 Pain Score: 9  Pain Type: Acute pain Pain Location: Shoulder Pain Orientation: Left Pain Radiating Towards: back Pain Descriptors / Indicators: Shooting Pain Frequency: Intermittent Pain Onset: On-going Patients Stated Pain Goal: 2 Pain Intervention(s): heat applied, repositioned  Therapy/Group: Individual Therapy  [name] 01/31/2020, 3:01 PM

## 2020-01-31 NOTE — Progress Notes (Signed)
Physical Therapy Session Note  Patient Details  Name: Anna Campos MRN: [id number] Date of Birth: 02/09/1966  Today's Date: 01/31/2020 PT Individual Time: 1415-1500 PT Individual Time Calculation (min): 45 min   Short Term Goals: Week 3:  PT Short Term Goal 1 (Week 3): =LTG due to ELOS  Skilled Therapeutic Interventions/Progress Updates:    Pt received seated in recliner in room, agreeable to PT session. No complaints of pain. Attempt use of regular RW vs PFRW this session. Pt exhibits decreased ability to grasp regular RW handles as her hands slide forwards during gait. Trial use of B hand splints on RW, pt again has trouble with hands sliding forwards and declines use of straps on hand splints. Trial use of coban wrapped around RW handles for improved grip. Pt does exhibit improved grip with use of BUE on RW handles with use of coban. Pt is able to ambulate 2 x 100 ft with RW at min A level, increase in ataxia and decreased overall control of RW without use of platforms. Will continue to assess best RW setup for pt prior to d/c home later this week. Standing alt L/R 4" step-ups with use of RW for balance and min to mod A for safety when ascending and descending, 2 x 5 reps. Pt requests to return to bed at end of session. Sit to supine Supervision. Pt left seated in bed with needs in reach, bed alarm in place at end of session.  Therapy Documentation Precautions:  Precautions Precautions: Fall, Cervical Precaution Comments: reviewed cervical precautions; orthostatic Required Braces or Orthoses: Cervical Brace Cervical Brace: Soft collar(collar can be removed for showering per Dr. Berline Chough) Restrictions Weight Bearing Restrictions: No Other Position/Activity Restrictions: collar at all times per chart    Therapy/Group: Individual Therapy   Peter Congo, PT, DPT  01/31/2020, 3:13 PM

## 2020-01-31 NOTE — Progress Notes (Signed)
Occupational Therapy Session Note  Patient Details  Name: Anna Campos MRN: [id number] Date of Birth: July 27, 1966  Today's Date: 01/31/2020 OT Individual Time: 1030-1130 OT Individual Time Calculation (min): 60 min    Short Term Goals: Week 3:  OT Short Term Goal 1 (Week 3): Pt will complete UB dressing with min assist for pullover shirt only. OT Short Term Goal 2 (Week 3): Pt will complete LB dressing with MOD A for pants only OT Short Term Goal 3 (Week 3): Pt will perform seated grooming task with MIN A for more than 2 consecutive sessions  Skilled Therapeutic Interventions/Progress Updates:  Pt received asleep in recliner easily able to arouse and agreeable to OT intervention. Pt with thigh high TED hose and ABD binder donned with no c/o OH. Pt report needing to void able to complete stand pivot transfer to [facility] with MIN A and no AD. MIN A for hygiene needing assist to manage clothing with pt able to complete anterior pericare in standing with set- up assist. Pt complete functional mobility with PFRW from BSC>w/c with CGA. Pt transported to shower room with total A for time mgmt. Pt complete tub transfer with TTB with MIN A for balance and verbal cues foir sequencing of task. Education provided on where to obtain TTB as insurance likely will not cover. Pt transported to therapy gym with total A. Pt complete dynamic standing balance tasks via dynavision for 4 min total. Pt with greater difficulty using LLE to push buttons however improved as activity progressed Noted  good finger isolation on RUE. Pt required CGA for balance during task. Pt completed functional mobility obstacle course where pt able to complete 2 steps and navigate around cones with PFRW and CGA. Pt completed x20 half squats from w/c to facilitate BLE strength for functional mobility. Pt completed seated FMC task via "go fish" game. Pt able to translate cards in hand and utilized lap to flip cards as compensatory method. Pt  transported back to room in similar fashion as previously indicated. Pt complete household distance functional mobility back to recliner with PFRW and CGA. Pt left seated in recliner with all needs within reach.   Therapy Documentation Precautions:  Precautions Precautions: Fall, Cervical Precaution Comments: reviewed cervical precautions; orthostatic Required Braces or Orthoses: Cervical Brace Cervical Brace: Soft collar(collar can be removed for showering per Dr. Berline Chough) Restrictions Weight Bearing Restrictions: No Other Position/Activity Restrictions: collar at all times per chart General:   Vital Signs:   Pain: Pt reports no pain during session.   Therapy/Group: Individual Therapy  Angelina Pih 01/31/2020, 12:07 PM

## 2020-02-01 ENCOUNTER — Inpatient Hospital Stay (HOSPITAL_COMMUNITY): Payer: Commercial Managed Care - PPO | Admitting: Physical Therapy

## 2020-02-01 ENCOUNTER — Inpatient Hospital Stay (HOSPITAL_COMMUNITY): Payer: Commercial Managed Care - PPO

## 2020-02-01 LAB — BASIC METABOLIC PANEL
Anion gap: 8 (ref 5–15)
BUN: 16 mg/dL (ref 6–20)
CO2: 29 mmol/L (ref 22–32)
Calcium: 9.6 mg/dL (ref 8.9–10.3)
Chloride: 102 mmol/L (ref 98–111)
Creatinine, Ser: 0.74 mg/dL (ref 0.44–1.00)
GFR calc Af Amer: >60 mL/min (ref >60)
GFR calc non Af Amer: >60 mL/min (ref >60)
Glucose, Bld: 90 mg/dL (ref 70–99)
Potassium: 4.1 mmol/L (ref 3.5–5.1)
Sodium: 139 mmol/L (ref 135–145)

## 2020-02-01 LAB — CBC WITH DIFFERENTIAL/PLATELET
Abs Immature Granulocytes: 0.01 10*3/uL (ref 0.00–0.07)
Basophils Absolute: 0 10*3/uL (ref 0.0–0.1)
Basophils Relative: 1 %
Eosinophils Absolute: 0.3 10*3/uL (ref 0.0–0.5)
Eosinophils Relative: 5 %
HCT: 33 % — ABNORMAL LOW (ref 36.0–46.0)
Hemoglobin: 10.3 g/dL — ABNORMAL LOW (ref 12.0–15.0)
Immature Granulocytes: 0 %
Lymphocytes Relative: 33 %
Lymphs Abs: 2 10*3/uL (ref 0.7–4.0)
MCH: 29.8 pg (ref 26.0–34.0)
MCHC: 31.2 g/dL (ref 30.0–36.0)
MCV: 95.4 fL (ref 80.0–100.0)
Monocytes Absolute: 0.6 10*3/uL (ref 0.1–1.0)
Monocytes Relative: 10 %
Neutro Abs: 3.1 10*3/uL (ref 1.7–7.7)
Neutrophils Relative %: 51 %
Platelets: 310 10*3/uL (ref 150–400)
RBC: 3.46 MIL/uL — ABNORMAL LOW (ref 3.87–5.11)
RDW: 12.6 % (ref 11.5–15.5)
WBC: 5.9 10*3/uL (ref 4.0–10.5)
nRBC: 0 % (ref 0.0–0.2)

## 2020-02-01 NOTE — Progress Notes (Signed)
Physical Therapy Session Note  Patient Details  Name: Anna Campos MRN: [id number] Date of Birth: [birth year]  Today's Date: 02/01/2020 PT Individual Time: 1400-1445 PT Individual Time Calculation (min): 45 min   Short Term Goals: Week 3:  PT Short Term Goal 1 (Week 3): =LTG due to ELOS  Skilled Therapeutic Interventions/Progress Updates:    Pt received seated in recliner in room, agreeable to PT session. No complaints of pain. Sit to stand with CGA to min A throughout session. Stand pivot transfer recliner to w/c with RW and min A. Ascend/descend 4 x 6" stairs with 2 handrails and min A, step-to gait pattern. Ascend/descend stairs again with focus on RW management at bottom of stairs to simulate home environment. Ambulation 2 x 150 ft with RW and CGA to min A overall, improved control with use of RW from previous date, use of coban wrap for improved grip. Pt does fatigue quicker with use of RW vs PFRW. Nustep level 3 x 10 min with use of BLE only for global endurance training. Attempt to incorporate BUE but they fatigue quickly. Stand pivot transfer back to w/c then back to recliner with RW and CGA. Pt left seated in recliner in room with needs in reach, chair alarm in place at end of session.  Therapy Documentation Precautions:  Precautions Precautions: Fall, Cervical Precaution Comments: reviewed cervical precautions; orthostatic Required Braces or Orthoses: Cervical Brace Cervical Brace: Soft collar(collar can be removed for showering per Dr. Berline Chough) Restrictions Weight Bearing Restrictions: No Other Position/Activity Restrictions: collar at all times per chart    Therapy/Group: Individual Therapy   Peter Congo, PT, DPT  02/01/2020, 3:47 PM

## 2020-02-01 NOTE — Plan of Care (Signed)
  Problem: Consults Goal: RH SPINAL CORD INJURY PATIENT EDUCATION Description:  See Patient Education module for education specifics.  Outcome: Progressing   Problem: SCI BOWEL ELIMINATION Goal: RH STG MANAGE BOWEL WITH ASSISTANCE Description: STG Manage Bowel with mod I Assistance. Outcome: Progressing   Problem: SCI BLADDER ELIMINATION Goal: RH STG MANAGE BLADDER WITH ASSISTANCE Description: STG Manage Bladder With Mod I Assistance Outcome: Progressing   Problem: RH SKIN INTEGRITY Goal: RH STG MAINTAIN SKIN INTEGRITY WITH ASSISTANCE Description: STG Maintain Skin Integrity With mod I Assistance. Outcome: Progressing   Problem: RH SAFETY Goal: RH STG ADHERE TO SAFETY PRECAUTIONS W/ASSISTANCE/DEVICE Description: STG Adhere to Safety Precautions With cues and reminders  Outcome: Progressing Goal: RH STG DECREASED RISK OF FALL WITH ASSISTANCE Description: STG Decreased Risk of Fall With mod I Assistance. Outcome: Progressing   Problem: RH PAIN MANAGEMENT Goal: RH STG PAIN MANAGED AT OR BELOW PT'S PAIN GOAL Description: Pain level less than 4 on scale of 0-10 Outcome: Progressing   Problem: RH KNOWLEDGE DEFICIT SCI Goal: RH STG INCREASE KNOWLEDGE OF SELF CARE AFTER SCI Description: Pt will be able to manage bowel program with min assist from family. Pt will be able to adhere to medication regimen with mod I assist from family.  Outcome: Progressing   

## 2020-02-01 NOTE — Progress Notes (Signed)
Occupational Therapy Session Note  Patient Details  Name: Anna Campos MRN: [id number] Date of Birth: 1966-09-14  Today's Date: 02/01/2020 OT Individual Time: 1000-1100 OT Individual Time Calculation (min): 60 min    Short Term Goals: Week 4:  OT Short Term Goal 1 (Week 4): LTG=STG secondary to ELOS  Skilled Therapeutic Interventions/Progress Updates:  Pt asleep upon OTA arrival with ABD binder and thigh high TED hose donned with no s/s of OH. Pt easily able to arouse and agreeable to OT intervention. Pt requesting to void bladder needing MINA for functional mobility from recliner to bathroom with RW. Pt complete anterior hygiene with supervision. Pt complete functional mobility back to w/c with RW and MINA. Pt transported to therapy gym with total A for time mgmt. Pt complete standing dynamic balance activity via checkers with CGA for balance to increase BUE FMC and facilitate improved dynamic standing balance for higher level functional mobility. Pt able to tolerate standing ~ 12 mins with RW for balance. Pt complete sit<>stand therapeutic activity where pt instructed to sit<>stand with RW and retrieve clothespin from basketball net. Pt completed x12 sit<> stands with MIN- CGA. Education provided on starting to incorporate utilizing BUEs to push into standing. Pt required visual and tactile cues to facilitate accurate hand placement. Pt transported back to room from w/c with total A where pt completed household distance functional mobility back to recliner. Pt left seated in recliner with all needs within reach and chair alarm activated.   Therapy Documentation Precautions:  Precautions Precautions: Fall, Cervical Precaution Comments: reviewed cervical precautions; orthostatic Required Braces or Orthoses: Cervical Brace Cervical Brace: Soft collar(collar can be removed for showering per Dr. Berline Chough) Restrictions Weight Bearing Restrictions: No Other Position/Activity Restrictions: collar at  all times per chart General:   Vital Signs:   Pain: Pt reports no pain during session.   Therapy/Group: Individual Therapy  Angelina Pih 02/01/2020, 12:02 PM

## 2020-02-01 NOTE — Progress Notes (Signed)
Occupational Therapy Session Note  Patient Details  Name: Chrystian Ressler MRN: [id number] Date of Birth: [birth year]  Today's Date: 02/01/2020 OT Individual Time: 1130-1155 OT Individual Time Calculation (min): 25 min    Short Term Goals: Week 4:  OT Short Term Goal 1 (Week 4): LTG=STG secondary to ELOS  Skilled Therapeutic Interventions/Progress Updates:    Pt resting in recliner upon arrival.  OT intervention with focus on BUE AROM/strengthening and sit<>stand from recliner.  Sit<>stand with CGA/Min A from recliner. Standing balance with CGA using RW. Sit<>stand X 5. Soft tissue mobilizations on upper traps to facilitate increased BUE AROM and assist in pain mgmt. Pt noted with trigger points in upper traps L>R with softening of tissue noted after mobilizations. Pt remained in recliner with all needs within reach. Pt demonstrated ability to push call bell and remote to signal for assistance when needed.   Therapy Documentation Precautions:  Precautions Precautions: Fall, Cervical Precaution Comments: reviewed cervical precautions; orthostatic Required Braces or Orthoses: Cervical Brace Cervical Brace: Soft collar(collar can be removed for showering per Dr. Berline Chough) Restrictions Weight Bearing Restrictions: No Other Position/Activity Restrictions: collar at all times per chart  Pain:  Pt denies pain this morning   Therapy/Group: Individual Therapy  [name] 02/01/2020, 2:14 PM

## 2020-02-01 NOTE — Progress Notes (Signed)
Nutrition Follow-up  DOCUMENTATION CODES:   Non-severe (moderate) malnutrition in context of social or environmental circumstances, Underweight  INTERVENTION:  -Continue Ensure Enlive poTID, each supplement provides 350 kcal and 20 grams of protein -Continue MVI with minerals daily   NUTRITION DIAGNOSIS:   Moderate Malnutrition related to social / environmental circumstances as evidenced by mild fat depletion, moderate fat depletion, mild muscle depletion, moderate muscle depletion.  Ongoing.  GOAL:   Patient will meet greater than or equal to 90% of their needs  Progressing.  MONITOR:   PO intake, Supplement acceptance, Labs, Weight trends, Skin, I & O's  REASON FOR ASSESSMENT:   Malnutrition Screening Tool    ASSESSMENT:   Anna Campos is a 54 year old right-handed female with unremarkable past medical history except tobacco abuse on no prescription medications. She works in Clinical biochemist for [employer].Presented 12/18/2019 with syncope, orthostasis and generalized fatigue as well as reported fall by her family and questionable loss of consciousness.  Neurosurgery Dr. Jordan Likes consulted and plan would be for surgical intervention with decompression and stabilization after antibiotic therapy completed.  Hospital course complicated by hemoglobin 6.8 from admission hemoglobin 10.7 patient did receive 2 units packed red blood cells 12/25/2019.  Patient occult heme positive.  Gastroenterology services Dr. Matthias Hughs consulted underwent EGD 12/27/2019 showing normal-appearing esophagus.  Mild erythematous mucosa in the entire gastric cavity.  2 superficial prepyloric ulcers 1 with clean base and the other with pigmentation.  1 Endo Clip was deployed to close the mucosal defect.  Normal-appearing cardia and fundus.  H. pylori serology positive placed on triple therapy clindamycin amoxicillin PPI 12/30/2019 patient was cleared to resume intravenous heparin and PPI was added twice daily for 2  months.  Her latest hemoglobin was 9.4 on 01/07/2020.  Patient also with multiple dental caries underwent multiple extractions 12/30/2019.  Patient was later cleared for surgical intervention and underwent C3-4, 4-5, 5-6, C6-7 anterior cervical decompression and fusion utilizing interbody cages 01/04/2020 per Dr. Jordan Likes.  Cervical brace as directed.  Maintained on a regular diet.  She currently remains on intravenous heparin for pulmonary emboli and transitioned to Eliquis 01/11/2020.  Therapy evaluations completed and patient was admitted for a comprehensive rehab program.  Pt admitted with quadriplegia secondary to C5 incomplete ASIA C initially, now Dspinal cord injury.S/P C3-4 4-55-6 and C6-7 anterior cervical decompression and fusion 01/04/2020.  Noted anticipated discharge on 02/05/20.   Pt reports appetite is still good and that she is drinking her supplements well. Pt would like to continue receiving Ensure.   PO Intake: 75-100% x last 7 recorded meals (90% average intake)  Admit wt: 53.8 kg Current wt: 55.2 kg  Labs reviewed. Medications reviewed and include: Ensure Enlive TID, Florinef, MVI, Senokot-S  Diet Order:   Diet Order            Diet regular Room service appropriate? Yes; Fluid consistency: Thin  Diet effective now              EDUCATION NEEDS:   Education needs have been addressed  Skin:  Skin Assessment: Reviewed RN Assessment Skin Integrity Issues:: Incisions Stage II: sacrum, buttocks Incisions: neck  Last BM:  4/11  Height:   Ht Readings from Last 1 Encounters:  01/11/20 5\' 9"  (1.753 m)    Weight:   Wt Readings from Last 1 Encounters:  02/01/20 55.2 kg    BMI:  Body mass index is 17.97 kg/m.  Estimated Nutritional Needs:   Kcal:  1700-1900  Protein:  90-105 grams  Fluid:  > 1.7 L   Larkin Ina, MS, RD, LDN RD pager number and weekend/on-call pager number located in [place].

## 2020-02-01 NOTE — Progress Notes (Signed)
Occupational Therapy Session Note  Patient Details  Name: Lasheena Frieze MRN: [id number] Date of Birth: June 14, 1966  Today's Date: 02/01/2020 OT Individual Time: 0800-0900 OT Individual Time Calculation (min): 60 min    Short Term Goals: Week 4:  OT Short Term Goal 1 (Week 4): LTG=STG secondary to ELOS  Skilled Therapeutic Interventions/Progress Updates:  Pt recived seated in recliner agreeable to OT intervention with TED hose and ABD binder donned. Pt with no c/o OH during session. Pt requesting to shower this session. MIN A for stand pivot to reclining shower chair. Pt transported into shower from shower chair with total A for safety. Pt completed seated bathing with LH sponge with set- up assist. Pt completed UB dressing with MIN A and MAX A for LB dressing for time mgmt. MIN- CGA for sit<>stands with RW to pull pants up to waist line. Pt completed seated grooming tasks to apply lotion to BLE and BUE with set- up assist. Pt left seated in recliner with chair alarm activated and all needs within reach.   Therapy Documentation Precautions:  Precautions Precautions: Fall, Cervical Precaution Comments: reviewed cervical precautions; orthostatic Required Braces or Orthoses: Cervical Brace Cervical Brace: Soft collar(collar can be removed for showering per Dr. Berline Chough) Restrictions Weight Bearing Restrictions: No Other Position/Activity Restrictions: collar at all times per chart General:   Vital Signs:   Pain: Pt reports no pain during session.   Therapy/Group: Individual Therapy  Angelina Pih 02/01/2020, 11:09 AM

## 2020-02-01 NOTE — Progress Notes (Signed)
Social Work Patient ID: Anna Campos, female   DOB: Jan 05, 1966, 54 y.o.   MRN: [id number]   SW met with pt in room to inform pt on target for d/c for 4/17, and recommendations: platform RW;TTB  and HHPT/OT. SW provided pt with HHA list ([employer]). Pt states she intends to purchase TTB. SW to follow-up about HHA preference.   Loralee Pacas, MSW, [place] Office: [phone] Cell: [phone] Fax: [phone]

## 2020-02-01 NOTE — Plan of Care (Signed)
  Problem: Consults Goal: RH SPINAL CORD INJURY PATIENT EDUCATION Description:  See Patient Education module for education specifics.  Outcome: Progressing   Problem: SCI BOWEL ELIMINATION Goal: RH STG MANAGE BOWEL WITH ASSISTANCE Description: STG Manage Bowel with mod I Assistance. Outcome: Progressing   Problem: SCI BLADDER ELIMINATION Goal: RH STG MANAGE BLADDER WITH ASSISTANCE Description: STG Manage Bladder With Mod I Assistance Outcome: Progressing   Problem: RH SAFETY Goal: RH STG ADHERE TO SAFETY PRECAUTIONS W/ASSISTANCE/DEVICE Description: STG Adhere to Safety Precautions With cues and reminders  Outcome: Progressing

## 2020-02-01 NOTE — Progress Notes (Signed)
[facility] PHYSICAL MEDICINE & REHABILITATION PROGRESS NOTE   Subjective/Complaints:  Pt reports feeling fine- no orthostatic hypotension Sx's in past few days- even took shower today and 1x before and tolerated without Abd binder and TEDs-  Has SS commission meeting tomorrow at 11am. Family training this week.    ROS:   Pt denies SOB, abd pain, CP, N/V/C/D, and vision changes   Objective:   No results found. Recent Labs    02/01/20 0501  WBC 5.9  HGB 10.3*  HCT 33.0*  PLT 310   Recent Labs    02/01/20 0501  NA 139  K 4.1  CL 102  CO2 29  GLUCOSE 90  BUN 16  CREATININE 0.74  CALCIUM 9.6    Intake/Output Summary (Last 24 hours) at 02/01/2020 1041 Last data filed at 02/01/2020 0841 Gross per 24 hour  Intake 702 ml  Output --  Net 702 ml     Physical Exam: Vital Signs Blood pressure 108/65, pulse 71, temperature 98 F (36.7 C), temperature source Oral, resp. rate 16, height 5\' 9"  (1.753 m), weight 55.2 kg, SpO2 100 %.  Constitutional: sitting in shower, with OT- using long handled sponge to wash feet, NAD HEENT:missing teeth, smiling, conjugate gaze Neck: wearing soft collar Cardiovascular: no JVD Respiratory/Chest:no resp distress, no accessory muscle use GI/Abdomen:nondistended; NT Ext: no clubbing, cyanosis, or edema Psych: bright affect  Musc: no LE edema Neuro: Ox3 Motor: Muscles in upper traps, levators, and scalenes looser, but not loose yet RUE: Shoulder abduction 2+/5, elbow flexion/extension 3+/5, handgrip 2/5,  Finger abd 1/5 LUE: Shoulder abduction 2+/5 , elbow flexion/extension 3/5, and grip 3-/5; finger abd 1/5 Left lower extremity: 4/5 proximal distal Right lower extremity: Hip flexion, knee extension 3 to 3+/5, ankle dorsiflexion 3/5  Assessment/Plan: 1. Functional deficits secondary to C5 ASIA D due to osteomyelitis which require 3+ hours per day of interdisciplinary therapy in a comprehensive inpatient rehab setting.  Physiatrist is  providing close team supervision and 24 hour management of active medical problems listed below.  Physiatrist and rehab team continue to assess barriers to discharge/monitor patient progress toward functional and medical goals  Care Tool:  Bathing    Body parts bathed by patient: Face   Body parts bathed by helper: Right lower leg, Left lower leg     Bathing assist Assist Level: Set up assist     Upper Body Dressing/Undressing Upper body dressing   What is the patient wearing?: Pull over shirt    Upper body assist Assist Level: Minimal Assistance - Patient > 75%    Lower Body Dressing/Undressing Lower body dressing      What is the patient wearing?: Pants     Lower body assist Assist for lower body dressing: Moderate Assistance - Patient 50 - 74%     Toileting Toileting    Toileting assist Assist for toileting: Total Assistance - Patient < 25%     Transfers Chair/bed transfer  Transfers assist     Chair/bed transfer assist level: Contact Guard/Touching assist     Locomotion Ambulation   Ambulation assist      Assist level: Minimal Assistance - Patient > 75% Assistive device: Walker-rolling Max distance: 100'   Walk 10 feet activity   Assist     Assist level: Minimal Assistance - Patient > 75% Assistive device: Walker-rolling   Walk 50 feet activity   Assist Walk 50 feet with 2 turns activity did not occur: Safety/medical concerns  Assist level: Minimal Assistance -  Patient > 75% Assistive device: Walker-rolling    Walk 150 feet activity   Assist Walk 150 feet activity did not occur: Safety/medical concerns  Assist level: Contact Guard/Touching assist Assistive device: Walker-platform    Walk 10 feet on uneven surface  activity   Assist Walk 10 feet on uneven surfaces activity did not occur: Safety/medical concerns   Assist level: Minimal Assistance - Patient > 75% Assistive device: Walker-platform    Wheelchair     Assist Will patient use wheelchair at discharge?: Yes Type of Wheelchair: Manual    Wheelchair assist level: Supervision/Verbal cueing Max wheelchair distance: 13'    Wheelchair 50 feet with 2 turns activity    Assist        Assist Level: Dependent - Patient 0%   Wheelchair 150 feet activity     Assist      Assist Level: Dependent - Patient 0%   Blood pressure 108/65, pulse 71, temperature 98 F (36.7 C), temperature source Oral, resp. rate 16, height 5\' 9"  (1.753 m), weight 55.2 kg, SpO2 100 %.  Medical Problem List and Plan: 1.  Quadriplegia secondary to C5 incomplete ASIA C initially, now D spinal cord injury.S/P C3-4 4-55-6 and C6-7 anterior cervical decompression and fusion 01/04/2020.  Cervical brace as directed  Continue CIR   Precautions: Fall, cervical  -advised her to discuss dc date with her primary team this week  4/5- pt asking to leave early- after d/w pt risks/benefits of going/staying, she decided wanted to wait til 4/17 to leave- strength slightly improving.  2.  Antithrombotics/pulmonary emboli per CT angiogram of chest 12/20/2019: Lower extremity Doppler negative.   -DVT/anticoagulation: Intravenous heparin transitioned to Eliquis 01/11/2020             -antiplatelet therapy: N/A 3. Pain Management: Oxycodone and Robaxin as needed  4/8- pt reports pain not controlled anymore- used to be- but burning and aching- esp in neck and hips and back- will try to add tramadol 100 mg q8 hours for pain- also counseled can take 10 mg oxy IR-   4/9- pain MUCH better controlled- although would benefit from TrP injections, hates needles- wants TOM to do myofascial release- con't tramadol/oxy IR  4/13- pain well controlled with tramadol added  4. Mood: Provide emotional support             -antipsychotic agents: N/A 5. Neuropsych: This patient is capable of making decisions on her own behalf. 6. Skin/Wound Care: Routine skin checks 7.  Fluids/Electrolytes/Nutrition: Routine in and outs 8.  Anemia.  EGD completed 12/27/2019 per GI services.  Recommend PPI x2 months.  H. pylori serology positive.  Complete course of amoxicillin and Biaxin  Hemoglobin 11.0 on 3/24   4/1- pt wants iron- will start Ferrous gluconate  4/2- tolerating iron so far- had bowel program last night for some reason will verify with nursing.   4/5- off bowel program- going daily most days   Continue to monitor 9. Multiple dental caries.  Follow-up dental services after multiple extractions 12/30/2019 10. Neurogenic bowel due to SCI.    Continue bowel program  3/29 - will change to QOD per pt's wishes/bowel habits  3/30- pt now able to control BMs so will stop bowel program  4/8- will increase senokot-s to 2 tabs BID and d/c Iron since so constipated, and adding Tramadol  4/9- LBM 2 nights ago- con't meds- hopeful she won't get backed up again.  4/10: BM last night as well  4/13- BM q1-2 days-  no constipation per pt 11 Streptococcal bacteremia.  Intravenous ceftriaxone completed. 12.  History of tobacco use.  Counseling 13. Spasticity- improved since surgery- for now, doesn't need spasticity agents-  14. Neurogenic bladder/frequency  Pt was started on Flomax- working well- no caths required in last 24 hours- will monitor.   PVRs with low volumes  Urinary frequency improving, cont Enablex 7.5 on 3/27  3/30- voiding well- PVRs look great- will stop PVRs after makes sure not retaining with cessation of flomax- con't Enablex- for urgency- will stop Flomax and make sure she's not retaining.   4/2- not retaining- stopped PVRs 15.  Hyponatremia  Sodium 133 on 3/24  16.  Transaminitis  LFTs elevated on 3/24   3/29- LFTs increase has resolved 17. Orthostatic hypotension  4/1- will add TEDS, abd binder and Florinef 0.1 mg daily since was so low and still low this AM  4/2- advised pt needs to continue Florinef- Sx's due to low BP, NOT medicine- clammy/dizzy-  will con't  4/4 BP's look good again today  4/5- Having Bps of 90/50s with PT with standing- asymptomatic- will always be low, but if doesn't need more meds, won't increase.   4/6- they are trying to use abd binder, florinef and ACE wraps- encouraged pt if no Sx's, to go without ACE wraps since they are uncomfortable sometimes.   4/7- explained to team, if no Sx's, no worries over low BP- also moved florinef to 6:30am so gets before AM therapy.  4/9- asymptomatic with low BP this AM- con't florinef  4/11: continues to be asymptomatic with low BP. Continue use of TEDs and abdominal binder.    4/13- will con't Florinef til after d/c- has lower BP but asymptomatic- showering without abd binder and TEDs and tolerating. con't regimen 18. Sexuality/intimacy in SCI patient  4/8- discussed for 15 minutes about positions, sensation, orgasms and how to accomplish those topics.  19. Dispo  4/8- also advised no EtOH on pain meds- once off meds, can have ONE drink total in 24 hour period.  4/13- family training this week.     LOS: 21 days A FACE TO FACE EVALUATION WAS PERFORMED  Jaicion Laurie 02/01/2020, 10:41 AM

## 2020-02-01 NOTE — Patient Care Conference (Signed)
Inpatient RehabilitationTeam Conference and Plan of Care Update Date: 02/01/2020   Time: 11:05 AM    Patient Name: Anna Campos      Medical Record Number: [id number]  Date of Birth: Sep 30, 1966 Sex: Female         Room/Bed: 4W24C/4W24C-01 Payor Info: Payor: Theme park manager / Plan: UMR/[employer] PPO / Product Type: *No Product type* /    Admit Date/Time:  01/11/2020  4:34 PM  Primary Diagnosis:  Incomplete quadriplegia at C5-6 level [facility])  Patient Active Problem List   Diagnosis Date Noted  . Pressure injury of skin 01/15/2020  . Transaminitis   . Hyponatremia   . Anemia   . Neurogenic bowel 01/11/2020  . Neurogenic bladder 01/11/2020  . Malnutrition of moderate degree 01/01/2020  . Poor dentition   . Spinal stenosis of cervical region   . Incomplete quadriplegia at C5-6 level ([place])   . Hypotension   . Bacteremia   . Syncope 12/19/2019    Expected Discharge Date: Expected Discharge Date: 02/05/20  Team Members Present: Physician leading conference: Dr. Courtney Heys Care Coodinator Present: Karene Fry, RN, MSN;Christina Kai Levins, [place] Nurse Present: Ellison Carwin, LPN PT Present: Excell Seltzer, PT OT Present: Roanna Epley, COTA PPS Coordinator present : Gunnar Fusi, SLP     Current Status/Progress Goal Weekly Team Focus  Bowel/Bladder   cont of b/b [facility] 4/11  remain continent of b/b with minimal assistance  assess with tolieitng needs prn   Swallow/Nutrition/ Hydration             ADL's   set- up for UB/LB bathing in reclining shower seat using [place] spinge for LB bathing and bath mit for UB bathing, MIN A for UB dressing, MOD A for LB dressing, self feed with set- up using built up foam, MINA for [place] transfer with TTB and PFRW, MINA stand pivot transfer to [place] A  family education, [facility] taks related to ADL participation and functional HEP to carryover at home   Mobility   Supervision bed mobility, CGA to min A sit to stand, transfers CGA with  PFRW, gait up to 200 ft with PFRW and CGA vs min A with RW  min A overall  UE and LE NMR, gait, transfers, family education, d/c planning   Communication             Safety/Cognition/ Behavioral Observations            Pain   c/o of neck/generalized pain, oxycodone prn  pain score <3  assess pain level qshift and prn   Skin   incision to neck  remain free of new infection and skin breakdown  assess skin qshift and prn    Rehab Goals Patient on target to meet rehab goals: Yes *See Care Plan and progress notes for long and short-term goals.     Barriers to Discharge  Current Status/Progress Possible Resolutions Date Resolved   Nursing                  PT                    OT                  SLP                SW                Discharge Planning/Teaching Needs:  D/c to home with 24/7  care from her husband. Husband intends to take FMLA.  Family education as recommended by therapy   Team Discussion: MD orthostatic hypotension better, pain better with med adjustments, off bowel program.  RN pain controlled, BM 4/11, abd binder.  PT S bed, stand CGA/min a, amb 200' CGA/min A, min A stairs.  OT set up bathing, showered, min UB dressing, mod/max LB Dressing, using built up utensils self feed, toilet transfers min A, amb min/CGA.  Has husband and Dtr.   Revisions to Treatment Plan: N/A     Medical Summary Current Status: continent; no oxy for a few days Weekly Focus/Goal: PT supervision ; CGA-min asist to stand; switched to RW_ min assist- ; min assist for stairs; family training tomorrow  Barriers to Discharge: Decreased family/caregiver support;Home enviroment access/layout;Weight;Other (comments)  Barriers to Discharge Comments: orthostatic hypotension Possible Resolutions to Barriers: OT_ S/u for bathing; min assist UB; mod-max LB; feeds self mod I built up utensils; using RW more, not platform walker   Continued Need for Acute Rehabilitation Level of Care: The patient  requires daily medical management by a physician with specialized training in physical medicine and rehabilitation for the following reasons: Direction of a multidisciplinary physical rehabilitation program to maximize functional independence : Yes Medical management of patient stability for increased activity during participation in an intensive rehabilitation regime.: Yes Analysis of laboratory values and/or radiology reports with any subsequent need for medication adjustment and/or medical intervention. : Yes   I attest that I was present, lead the team conference, and concur with the assessment and plan of the team.   Trish Mage 02/01/2020, 4:34 PM   Team conference was held via web/ teleconference due to COVID - 19

## 2020-02-01 NOTE — Progress Notes (Signed)
Occupational Therapy Weekly Progress Note  Patient Details  Name: Anna Campos MRN: [id number] Date of Birth: [birth year]  Beginning of progress report period: January 25, 2020 End of progress report period: February 01, 2020  Today's Date: 02/01/2020      Patient has met 3 of 3 short term goals. Pt has made good progress this reporting period. Pt continues to present with decreased BUE [place], and impaired dynamic standing  balance limiting pts ability to engage in BADLs. Overall, pt requires MIN A for UB dressing, MOD A for LB dressing, MIN A- CGA for functional mobility with PFRW and set- up assist for seated grooming tasks. Pt BUE [place] continues to improve however pt requires increased time and effort to manipulate ADL items. Pt requires MIN- CGA for sit<>stands with PFRW from various surface heights and set- up assist for self feeding with built up utensils. Pt requires MIN A for stand pivot transfers to toilet and shower chair, set- up assist for UB bathing with bath mit and supervision for LB bathing with LH sponge. Pt completes toileting hygiene with set- up assist via lateral leans. Pt continues to be motivated towards reaching OT goals. Pts family to come in tomorrow ( April 14th) for family education. Will continue to progress pt towards OT goals per POC.   Patient continues to demonstrate the following deficits: muscle weakness, decreased cardiorespiratoy endurance and decreased sitting balance, decreased standing balance and decreased balance strategies and therefore will continue to benefit from skilled OT intervention to enhance overall performance with BADL, iADL and Reduce care partner burden.  Patient progressing toward long term goals..  Continue plan of care.  OT Short Term Goals Week 3:  OT Short Term Goal 1 (Week 3): Pt will complete UB dressing with min assist for pullover shirt only. OT Short Term Goal 1 - Progress (Week 3): Met OT Short Term Goal 2 (Week 3): Pt will complete LB  dressing with MOD A for pants only OT Short Term Goal 2 - Progress (Week 3): Met OT Short Term Goal 3 (Week 3): Pt will perform seated grooming task with MIN A for more than 2 consecutive sessions OT Short Term Goal 3 - Progress (Week 3): Met Week 4:  OT Short Term Goal 1 (Week 4): LTG=STG secondary to ELOS       Therapy Documentation Precautions:  Precautions Precautions: Fall, Cervical Precaution Comments: reviewed cervical precautions; orthostatic Required Braces or Orthoses: Cervical Brace Cervical Brace: Soft collar(collar can be removed for showering per Dr. Dagoberto Ligas) Restrictions Weight Bearing Restrictions: No Other Position/Activity Restrictions: collar at all times per chart General:   Vital Signs: Therapy Vitals Temp: 98 F (36.7 C) Temp Source: Oral Pulse Rate: 71 Resp: 16 BP: 108/65 Patient Position (if appropriate): Lying Oxygen Therapy SpO2: 100 % O2 Device: Room Air Pain:   ADL: ADL Equipment Provided: Feeding equipment Eating: Maximal assistance Where Assessed-Eating: Chair Grooming: Maximal assistance Where Assessed-Grooming: Wheelchair Upper Body Bathing: Maximal assistance Where Assessed-Upper Body Bathing: Wheelchair Lower Body Bathing: Dependent Where Assessed-Lower Body Bathing: Sitting at sink, Standing at sink, Wheelchair Upper Body Dressing: Maximal assistance Where Assessed-Upper Body Dressing: Wheelchair, Sitting at sink Lower Body Dressing: Dependent Where Assessed-Lower Body Dressing: Wheelchair Toileting: Dependent Where Assessed-Toileting: Bedside Commode Toilet Transfer: Dependent Armed forces technical officer Method: Arts development officer: Art gallery manager    Praxis   Exercises:   Other Treatments:     Therapy/Group: Individual Therapy  Ihor Gully 02/01/2020, 7:52 AM

## 2020-02-02 ENCOUNTER — Inpatient Hospital Stay (HOSPITAL_COMMUNITY): Payer: Commercial Managed Care - PPO

## 2020-02-02 ENCOUNTER — Encounter (HOSPITAL_COMMUNITY): Payer: Commercial Managed Care - PPO

## 2020-02-02 ENCOUNTER — Ambulatory Visit (HOSPITAL_COMMUNITY): Payer: Commercial Managed Care - PPO | Admitting: Physical Therapy

## 2020-02-02 NOTE — Progress Notes (Signed)
Occupational Therapy Session Note  Patient Details  Name: Anna Campos MRN: [id number] Date of Birth: 12-23-1965  Today's Date: 02/02/2020 OT Individual Time: 0800-0900 OT Individual Time Calculation (min): 60 min    Short Term Goals: Week 4:  OT Short Term Goal 1 (Week 4): LTG=STG secondary to ELOS  Skilled Therapeutic Interventions/Progress Updates:  Pt received seated in recliner without ABD binder or TED hose. Assisted pt with seated dressing needing total A to don TEDs and ABD binder. BP taken in sitting 100/64. MIN A for UB dressing and MAX A for LB dressing. Pt complete functional mobility with RW into bathroom for toileting. Supervision for toileting hygiene via lateral leans. Pt requires MOD A to pull pants back to up waist line in standing. Pt able to complete standing grooming tasks at sink with CGA- close supervision. Pt required verbal cues to recall maintaining cervical precautions during standing tasks. Set- up assist for oral care. Pt completed functional mobility back to recliner with CGA- close supervision with RW. Pt left seated in recliner with all needs within reach.   Therapy Documentation Precautions:  Precautions Precautions: Fall, Cervical Precaution Comments: reviewed cervical precautions; orthostatic Required Braces or Orthoses: Cervical Brace Cervical Brace: Soft collar(collar can be removed for showering per Dr. Berline Chough) Restrictions Weight Bearing Restrictions: No Other Position/Activity Restrictions: collar at all times per chart General:   Vital Signs:   Pain: Pt reports no pain during session.  Other Treatments:     Therapy/Group: Individual Therapy  Angelina Pih 02/02/2020, 9:02 AM

## 2020-02-02 NOTE — Progress Notes (Signed)
[facility] PHYSICAL MEDICINE & REHABILITATION PROGRESS NOTE   Subjective/Complaints:  Pt reports using RW to walk now- not platform walker- needs Supervision to CGA, but doing well- also needs verbal cues  Plans on getting COVID shot after d/c.   No orthostatic hypotension in last 4+ days.    ROS:   Pt denies SOB, abd pain, CP, N/V/C/D, and vision changes   Objective:   No results found. Recent Labs    02/01/20 0501  WBC 5.9  HGB 10.3*  HCT 33.0*  PLT 310   Recent Labs    02/01/20 0501  NA 139  K 4.1  CL 102  CO2 29  GLUCOSE 90  BUN 16  CREATININE 0.74  CALCIUM 9.6    Intake/Output Summary (Last 24 hours) at 02/02/2020 1941 Last data filed at 02/02/2020 1854 Gross per 24 hour  Intake 690 ml  Output --  Net 690 ml     Physical Exam: Vital Signs Blood pressure (!) 83/59, pulse 94, temperature 98.6 F (37 C), temperature source Oral, resp. rate 18, height 5\' 9"  (1.753 m), weight 55.8 kg, SpO2 100 %.  Constitutional: was cleaning self up on toilet after walking to bathroom with RW and OT, NAD HEENT:chronic- missing teeth- were exracted Neck: wearing soft collar Cardiovascular: RRR Respiratory/Chest: CTA B/L GI/Abdomen:soft, NT, ND, (+(BS Ext: no clubbing, cyanosis, or edema Psych: smiling Musc: no LE edema B/L Neuro: Ox3- appropriate Motor: Muscles in upper traps, levators, and scalenes looser, but not loose yet RUE: Shoulder abduction 2+/5, elbow flexion/extension 3+/5, handgrip 2/5,  Finger abd 1/5 LUE: Shoulder abduction 2+/5 , elbow flexion/extension 3/5, and grip 3-/5; finger abd 1/5 Left lower extremity: 4/5 proximal distal Right lower extremity: Hip flexion, knee extension 3 to 3+/5, ankle dorsiflexion 3/5  Assessment/Plan: 1. Functional deficits secondary to C5 ASIA D due to osteomyelitis which require 3+ hours per day of interdisciplinary therapy in a comprehensive inpatient rehab setting.  Physiatrist is providing close team supervision and  24 hour management of active medical problems listed below.  Physiatrist and rehab team continue to assess barriers to discharge/monitor patient progress toward functional and medical goals  Care Tool:  Bathing    Body parts bathed by patient: Right arm, Left arm, Chest, Abdomen, Front perineal area, Buttocks, Right upper leg, Left upper leg, Right lower leg, Left lower leg, Face   Body parts bathed by helper: Right lower leg, Left lower leg     Bathing assist Assist Level: Set up assist     Upper Body Dressing/Undressing Upper body dressing   What is the patient wearing?: Pull over shirt    Upper body assist Assist Level: Minimal Assistance - Patient > 75%    Lower Body Dressing/Undressing Lower body dressing      What is the patient wearing?: Pants, Underwear/pull up     Lower body assist Assist for lower body dressing: Maximal Assistance - Patient 25 - 49%     Toileting Toileting    Toileting assist Assist for toileting: Maximal Assistance - Patient 25 - 49%     Transfers Chair/bed transfer  Transfers assist     Chair/bed transfer assist level: Minimal Assistance - Patient > 75%     Locomotion Ambulation   Ambulation assist      Assist level: Contact Guard/Touching assist Assistive device: Walker-rolling Max distance: 150'   Walk 10 feet activity   Assist     Assist level: Contact Guard/Touching assist Assistive device: Walker-rolling   Walk 50 feet  activity   Assist Walk 50 feet with 2 turns activity did not occur: Safety/medical concerns  Assist level: Contact Guard/Touching assist Assistive device: Walker-rolling    Walk 150 feet activity   Assist Walk 150 feet activity did not occur: Safety/medical concerns  Assist level: Contact Guard/Touching assist Assistive device: Walker-rolling    Walk 10 feet on uneven surface  activity   Assist Walk 10 feet on uneven surfaces activity did not occur: Safety/medical  concerns   Assist level: Minimal Assistance - Patient > 75% Assistive device: Walker-platform   Wheelchair     Assist Will patient use wheelchair at discharge?: Yes Type of Wheelchair: Manual    Wheelchair assist level: Supervision/Verbal cueing Max wheelchair distance: 35'    Wheelchair 50 feet with 2 turns activity    Assist        Assist Level: Dependent - Patient 0%   Wheelchair 150 feet activity     Assist      Assist Level: Dependent - Patient 0%   Blood pressure (!) 83/59, pulse 94, temperature 98.6 F (37 C), temperature source Oral, resp. rate 18, height 5\' 9"  (1.753 m), weight 55.8 kg, SpO2 100 %.  Medical Problem List and Plan: 1.  Quadriplegia secondary to C5 incomplete ASIA C initially, now D spinal cord injury.S/P C3-4 4-55-6 and C6-7 anterior cervical decompression and fusion 01/04/2020.  Cervical brace as directed  Continue CIR   Precautions: Fall, cervical  -advised her to discuss dc date with her primary team this week  4/5- pt asking to leave early- after d/w pt risks/benefits of going/staying, she decided wanted to wait til 4/17 to leave- strength slightly improving.  2.  Antithrombotics/pulmonary emboli per CT angiogram of chest 12/20/2019: Lower extremity Doppler negative.   -DVT/anticoagulation: Intravenous heparin transitioned to Eliquis 01/11/2020             -antiplatelet therapy: N/A 3. Pain Management: Oxycodone and Robaxin as needed  4/8- pt reports pain not controlled anymore- used to be- but burning and aching- esp in neck and hips and back- will try to add tramadol 100 mg q8 hours for pain- also counseled can take 10 mg oxy IR-   4/9- pain MUCH better controlled- although would benefit from TrP injections, hates needles- wants TOM to do myofascial release- con't tramadol/oxy IR  4/13- pain well controlled with tramadol added  4. Mood: Provide emotional support             -antipsychotic agents: N/A 5. Neuropsych: This patient is  capable of making decisions on her own behalf. 6. Skin/Wound Care: Routine skin checks 7. Fluids/Electrolytes/Nutrition: Routine in and outs 8.  Anemia.  EGD completed 12/27/2019 per GI services.  Recommend PPI x2 months.  H. pylori serology positive.  Complete course of amoxicillin and Biaxin  Hemoglobin 11.0 on 3/24   4/1- pt wants iron- will start Ferrous gluconate  4/2- tolerating iron so far- had bowel program last night for some reason will verify with nursing.   4/5- off bowel program- going daily most days   Continue to monitor 9. Multiple dental caries.  Follow-up dental services after multiple extractions 12/30/2019 10. Neurogenic bowel due to SCI.    Continue bowel program  3/29 - will change to QOD per pt's wishes/bowel habits  3/30- pt now able to control BMs so will stop bowel program  4/8- will increase senokot-s to 2 tabs BID and d/c Iron since so constipated, and adding Tramadol  4/9- LBM 2 nights ago- con't  meds- hopeful she won't get backed up again.  4/10: BM last night as well  4/13- BM q1-2 days- no constipation per pt 11 Streptococcal bacteremia.  Intravenous ceftriaxone completed. 12.  History of tobacco use.  Counseling 13. Spasticity- improved since surgery- for now, doesn't need spasticity agents-  14. Neurogenic bladder/frequency  Pt was started on Flomax- working well- no caths required in last 24 hours- will monitor.   PVRs with low volumes  Urinary frequency improving, cont Enablex 7.5 on 3/27  3/30- voiding well- PVRs look great- will stop PVRs after makes sure not retaining with cessation of flomax- con't Enablex- for urgency- will stop Flomax and make sure she's not retaining.   4/2- not retaining- stopped PVRs 15.  Hyponatremia  Sodium 133 on 3/24  16.  Transaminitis  LFTs elevated on 3/24   3/29- LFTs increase has resolved 17. Orthostatic hypotension  4/1- will add TEDS, abd binder and Florinef 0.1 mg daily since was so low and still low this  AM  4/2- advised pt needs to continue Florinef- Sx's due to low BP, NOT medicine- clammy/dizzy- will con't  4/4 BP's look good again today  4/5- Having Bps of 90/50s with PT with standing- asymptomatic- will always be low, but if doesn't need more meds, won't increase.   4/6- they are trying to use abd binder, florinef and ACE wraps- encouraged pt if no Sx's, to go without ACE wraps since they are uncomfortable sometimes.   4/7- explained to team, if no Sx's, no worries over low BP- also moved florinef to 6:30am so gets before AM therapy.  4/9- asymptomatic with low BP this AM- con't florinef  4/11: continues to be asymptomatic with low BP. Continue use of TEDs and abdominal binder.    4/13- will con't Florinef til after d/c- has lower BP but asymptomatic- showering without abd binder and TEDs and tolerating. con't regimen  4/14- no orthostasis- will con't Florinef until d/c- her BP is still running 80-90s/50s, but not symptomatic- explained it's normal for SCI patients.  18. Sexuality/intimacy in SCI patient  4/8- discussed for 15 minutes about positions, sensation, orgasms and how to accomplish those topics.  19. Dispo  4/8- also advised no EtOH on pain meds- once off meds, can have ONE drink total in 24 hour period.  4/13- family training this week.   4/14- Social Security phone interview today    LOS: 22 days A FACE TO FACE EVALUATION WAS PERFORMED  Georgian Mcclory 02/02/2020, 7:41 PM

## 2020-02-02 NOTE — Plan of Care (Signed)
  Problem: Consults Goal: RH SPINAL CORD INJURY PATIENT EDUCATION Description:  See Patient Education module for education specifics.  Outcome: Progressing   Problem: SCI BOWEL ELIMINATION Goal: RH STG MANAGE BOWEL WITH ASSISTANCE Description: STG Manage Bowel with mod I Assistance. Outcome: Progressing   Problem: SCI BLADDER ELIMINATION Goal: RH STG MANAGE BLADDER WITH ASSISTANCE Description: STG Manage Bladder With Mod I Assistance Outcome: Progressing   Problem: RH SKIN INTEGRITY Goal: RH STG MAINTAIN SKIN INTEGRITY WITH ASSISTANCE Description: STG Maintain Skin Integrity With mod I Assistance. Outcome: Progressing   Problem: RH SAFETY Goal: RH STG ADHERE TO SAFETY PRECAUTIONS W/ASSISTANCE/DEVICE Description: STG Adhere to Safety Precautions With cues and reminders  Outcome: Progressing Goal: RH STG DECREASED RISK OF FALL WITH ASSISTANCE Description: STG Decreased Risk of Fall With mod I Assistance. Outcome: Progressing   Problem: RH PAIN MANAGEMENT Goal: RH STG PAIN MANAGED AT OR BELOW PT'S PAIN GOAL Description: Pain level less than 4 on scale of 0-10 Outcome: Progressing   Problem: RH KNOWLEDGE DEFICIT SCI Goal: RH STG INCREASE KNOWLEDGE OF SELF CARE AFTER SCI Description: Pt will be able to manage bowel program with min assist from family. Pt will be able to adhere to medication regimen with mod I assist from family.  Outcome: Progressing   

## 2020-02-02 NOTE — Progress Notes (Signed)
Social Work Patient ID: Anna Campos, female   DOB: 1966-10-05, 54 y.o.   MRN: [id number]   SW met with pt in room to follow-up about HHA preference. Pt intends to follow-up with her husband to discuss. SW also informed on DME items and where they will be ordered: [employer]. Pt wife states she received a call about this item today.   Anna Campos/[facility] (p: [phone]/NUU: [phone]) Anna Campos ([birth year]) approving through 4/17. Needs confirmation of d/c on 4/19. If pt needs more time, will need to be reviewed by medical director.   *SW ordered 3in1 BSC as requested by therapy.    Anna Campos, MSW, [place] Office: [phone] Cell: [phone] Fax: [phone]

## 2020-02-02 NOTE — Progress Notes (Signed)
Occupational Therapy Session Note  Patient Details  Name: Anna Campos MRN: [id number] Date of Birth: May 07, 1966  Today's Date: 02/02/2020 OT Individual Time: 0100-0215 OT Individual Time Calculation (min): 75 min    Short Term Goals: Week 4:  OT Short Term Goal 1 (Week 4): LTG=STG secondary to ELOS  Skilled Therapeutic Interventions/Progress Updates:  Pt received seated in recliner with ABD binder and TED hose donned finishing up lunch with family. Session focus on family education with pts husband and daughter. Education provided on pts current level of assist, compensatory methods to incorporate into ADLs at home, mgmt of cervical collar and shower collar, importance of donning TED hose and ABD binder related to pts hypotension, and AE need to use at home with family verbalizing understanding. Pt complete functional mobility from recliner to w/c with CGA needing MIN A for sit<>stand with RW. Pt transported to shower room with total A for time mgmt. Demo'ed tub transfer to TTB with pt needing light MIN A for safety, husband return demonstrate transfer with good carryover. Education provided on using suction grab bars for light balance assist at home and educated family on where to obtain grab bar as well as hand held showerhead. Pt complete household distance functional mobility with husband providing CGA and daughter following with w/c. Family verbalize understanding of assist needed during functional mobility. Pt able to navigate IADL tasks in kitchen with CGA and RW. Education provided on placing needed IADL items within reach to maintain cervical precautions with family verbalizing understanding. Pt additionally completed stair training with husband providing CGA. Husband able to verbalize understanding of technique and assist level needed. Pt transported back to room with total A where pt report needing to void bowels. MAX A for clothing mgmt and hygiene via sit<>stand. Pt left seated in recliner  with all needs within reach and family present.   Therapy Documentation Precautions:  Precautions Precautions: Fall, Cervical Precaution Comments: reviewed cervical precautions; orthostatic Required Braces or Orthoses: Cervical Brace Cervical Brace: Soft collar(collar can be removed for showering per Dr. Berline Chough) Restrictions Weight Bearing Restrictions: No Other Position/Activity Restrictions: collar at all times per chart General:   Vital Signs: Therapy Vitals Temp: 98.6 F (37 C) Temp Source: Oral Pulse Rate: 94 Resp: 18 BP: (!) 83/59 Patient Position (if appropriate): Sitting Oxygen Therapy SpO2: 100 % O2 Device: Room Air Pain: Pt reports no pain during session.   Therapy/Group: Individual Therapy  Angelina Pih 02/02/2020, 4:03 PM

## 2020-02-02 NOTE — Progress Notes (Signed)
Recreational Therapy Session Note  Patient Details  Name: Anna Campos MRN: [id number] Date of Birth: 1965-12-26 Today's Date: 02/02/2020  Pain: no c/o Skilled Therapeutic Interventions/Progress Updates: Session focused on discharge planning in regards to leisure and community pursuits.  Education/discussion included activity analysis with specific modifications (pet care, gardening, family games, going out to eat, cooking, etc).  Pt  able to list safety concerns and potential modifications that she could make with Mod I.  Discussed plans to simulate a community outing tomorrow  Including purpose of the outing and potential goals that would be addressed.  Pt agreeable to participate.  Therapy/Group: Individual Therapy  Bowden Boody,[place] 02/02/2020, 11:20 AM

## 2020-02-02 NOTE — Plan of Care (Signed)
  Problem: SCI BOWEL ELIMINATION Goal: RH STG MANAGE BOWEL WITH ASSISTANCE Description: STG Manage Bowel with mod I Assistance. Outcome: Progressing   Problem: SCI BLADDER ELIMINATION Goal: RH STG MANAGE BLADDER WITH ASSISTANCE Description: STG Manage Bladder With Mod I Assistance Outcome: Progressing   Problem: RH SKIN INTEGRITY Goal: RH STG MAINTAIN SKIN INTEGRITY WITH ASSISTANCE Description: STG Maintain Skin Integrity With mod I Assistance. Outcome: Progressing

## 2020-02-02 NOTE — Progress Notes (Signed)
Physical Therapy Session Note  Patient Details  Name: Anna Campos MRN: [id number] Date of Birth: 1966-07-20  Today's Date: 02/02/2020 PT Individual Time: 1430-1530 PT Individual Time Calculation (min): 60 min   Short Term Goals: Week 3:  PT Short Term Goal 1 (Week 3): =LTG due to ELOS  Skilled Therapeutic Interventions/Progress Updates:    Pt received seated in recliner in room, family present for hands-on education session. Pt is CGA for sit to stand transfers to RW throughout session. Recommending use of PFRW initially upon d/c home for energy conservation, increased balance, and increased independence with functional mobility. Pt is able to ambulate x 150 ft with RW at [facility] level. Demonstration of how to perform safe car transfer with use of RW. Pt and her family able to perform return demo with pt requiring CGA for safety at simulation height of pt's Jeep. Then discussed pt's bed at home. Per pt and family report her bed is a significant height up from the ground. Pt's husband reports they have a 6" stool at home they plan on using. Demonstration of how to safely back up to the bed and step up onto stool. Pt and her family able to perform return demo with pt requiring min A for safety. Pt is then able to perform supine to/from sit and rolling at Supervision level. Once pt supine in bed reviewed how to assist pt with ACE-wrapping BLE if necessary. Pt has been able to maintain BP and remain asymptomatic with use of abdominal binder and thigh-high TEDs at this point and would only require ACE wrap if BP dropped again and pt become symptomatic. Provided handout and demonstration for BLE ACE wrapping. Pt's husband and daughter able to perform return demo with min cueing for correct technique. Also provided handout for where to purchase bedrail as pt does rely on this for bed mobility. Pt left seated in bed with needs in reach, family present at end of session.  Therapy Documentation Precautions:   Precautions Precautions: Fall, Cervical Precaution Comments: reviewed cervical precautions; orthostatic Required Braces or Orthoses: Cervical Brace Cervical Brace: Soft collar(collar can be removed for showering per Dr. Berline Chough) Restrictions Weight Bearing Restrictions: No Other Position/Activity Restrictions: collar at all times per chart    Therapy/Group: Individual Therapy   Peter Congo, PT, DPT  02/02/2020, 4:04 PM

## 2020-02-03 ENCOUNTER — Inpatient Hospital Stay (HOSPITAL_COMMUNITY): Payer: Commercial Managed Care - PPO | Admitting: Physical Therapy

## 2020-02-03 ENCOUNTER — Inpatient Hospital Stay (HOSPITAL_COMMUNITY): Payer: Commercial Managed Care - PPO

## 2020-02-03 NOTE — Discharge Summary (Signed)
Physician Discharge Summary  Patient ID: Latesa Fratto MRN: [id number] DOB/AGE: 04-17-1966 54 y.o.  Admit date: 01/11/2020 Discharge date: 02/05/2020  Discharge Diagnoses:  Principal Problem:   Incomplete quadriplegia at C5-6 level [facility]) Active Problems:   Neurogenic bowel   Neurogenic bladder   Pressure injury of skin   Transaminitis   Hyponatremia   Anemia DVT prophylaxis Streptococcal bacteremia History of tobacco abuse  Discharged Condition: Stable  Significant Diagnostic Studies: No results found.  Labs:  Basic Metabolic Panel: Recent Labs  Lab 02/01/20 0501  NA 139  K 4.1  CL 102  CO2 29  GLUCOSE 90  BUN 16  CREATININE 0.74  CALCIUM 9.6    CBC: Recent Labs  Lab 02/01/20 0501  WBC 5.9  NEUTROABS 3.1  HGB 10.3*  HCT 33.0*  MCV 95.4  PLT 310    CBG: No results for input(s): GLUCAP in the last 168 hours.  Family history.  Mother with breast cancer.  Denies any hypertension hyperlipidemia diabetes mellitus colon cancer or esophageal cancer  Brief HPI:   Gabreille Dardis is a 54 y.o. right-handed female with unremarkable past medical history except tobacco abuse on no prescription medications.  Lives with spouse and 22 year old daughter independent prior to admission.  She works in Clinical biochemist for [employer].  1 level home 3 steps to entry.  Presented 12/18/2019 with syncope, orthostasis generalized fatigue as well as reported fall by her family and questionable loss of consciousness.  Admission chemistry showed blood culture Streptococcus alcohol negative lactic acid 1.8.  Cranial CT scan negative.  Echocardiogram with ejection fraction of 65% without emboli.  CTA of chest abdomen pelvis negative.  CT angiogram of the chest showed small nonocclusive pulmonary emboli bilaterally involving segmental and subsegmental size vessels.  Bilateral lower extremity Dopplers negative.  Patient was placed on heparin therapy.  MRI of the brain no evidence of acute  intracranial abnormality.  Infectious disease consulted for streptococcal bacteremia maintained on Rocephin x14 days initiated 12/20/2019.  TEE showed no vegetation.  CT of cervical spine showed prominent spinal cord signal abnormality at the C4-C6 levels.  Involvement of the entire cord cross-section at the lower C5 level.  Edema signal within the interspinous spaces at the C3-T2 levels.  C4-C5 posterior disc osteophyte complex contributes to severe spinal canal stenosis with spinal cord flattening.  Neurosurgery Dr. Jordan Likes consulted and plan would be for surgical intervention with decompression and stabilization after antibiotic therapy completed.  Hospital course complicated by hemoglobin 6.8 from admission hemoglobin 10.7 patient did receive 2 units packed red blood cells 12/25/2019.  Patient occult heme positive.  Gastroenterology services consulted underwent EGD 12/27/2019 showing normal-appearing esophagus.  Mild erythematous mucosa in the entire gastric cavity.  2 superficial prepyloric ulcers 1 with clean base and the other with pigmentation.  1 Endo Clip was deployed to close the mucosal defect.  Normal-appearing cardia and fundus.  H. pylori serology positive placed on triple therapy clindamycin amoxicillin PPI 12/30/2019 patient was cleared to resume intravenous heparin and PPI was added twice daily for 2 months.  Her latest hemoglobin 9.4 on 01/07/2020.  Patient was later cleared for surgical intervention underwent C3-4, 4 5, 5- 6, C6-7 anterior cervical decompression and fusion utilizing interbody cages 01/04/2020 per Dr. Jordan Likes.  Cervical brace as directed maintain on a regular diet.  She remained on intravenous heparin for pulmonary emboli transitioned to Eliquis 01/11/2020.  Therapy evaluations completed and patient was admitted for a comprehensive rehab program   Hospital Course: [name]  was admitted to rehab 01/11/2020 for inpatient therapies to consist of PT, ST and OT at least three hours five days a  week. Past admission physiatrist, therapy team and rehab RN have worked together to provide customized collaborative inpatient rehab.  Pertaining to patient's quadriplegia secondary to C5 incomplete [place] C initially now D spinal cord injury status post C3-4 4-55-6 and C6-7 anterior cervical decompression and fusion 01/04/2020.  Cervical brace as directed she would follow-up with neurosurgery.  She remained on Eliquis after being transition from heparin for pulmonary emboli per CT angiogram of chest 12/20/2019 no bleeding episodes and monitored.  Her hospital course was complicated by bouts of anemia EGD was completed per gastroenterology services recommended PPI x2 months H. pylori serology positive she completed course of amoxicillin and Biaxin and latest hemoglobin 10.3.  Pain management with Robaxin as indicated as well as oxycodone as needed and she had been on scheduled Ultram which would be tapered to off.  Bouts of orthostasis maintained on Florinef.  Hospital course streptococcal bacteremia she completed 14-day course of intravenous ceftriaxone she remained afebrile.  Neurogenic bowel and bladder she had been maintained on Flomax recently not requiring catheterization PVRs with low volumes and her Flomax is since been discontinued   Blood pressures were monitored on TID basis and soft and controlled  /She is continent of bowel and bladder.  Francis Dowse has made gains during rehab stay and is attending therapies  Francis Dowse will continue to receive follow up therapies   after discharge  Rehab course: During patient's stay in rehab weekly team conferences were held to monitor patient's progress, set goals and discuss barriers to discharge. At admission, patient required moderate assist side-lying to sitting minimal guard rolling minimal assist to mod assist supine to sit.  Max assist 20 feet 4 wheeled walker  Physical exam.  Blood pressure 128/70 pulse 70 respirations 18 oxygen saturation 96% room  air Constitutional alert and oriented well-developed HEENT Head.  Normocephalic and atraumatic Neck.  Cervical collar in place Cardiac regular rate rhythm without any extra sounds or murmur heard Abdomen.  Soft nontender positive bowel sounds without rebound Respiratory effort normal no respiratory distress without wheeze Musculoskeletal. Comments.  Right upper extremity biceps 5 out of 5 wrist extension 4 out of 5 triceps 3 out of 5 grip 2 out of 5 finger 0 out of 5 Left upper extremity biceps 5/5, wrist extension 4/5, triceps 2/5, grip 3/5, finger 1/5 Right lower extremity hip flexors 2/5 knee extension 4/5 dorsi flexion 2/5 plantar flexion 4-5 EHL 3/5  /She  has had improvement in activity tolerance, balance, postural control as well as ability to compensate for deficits. Francis Dowse has had improvement in functional use RUE/LUE  and RLE/LLE as well as improvement in awareness.  Patient is contact-guard for sit to stand transfers to rolling walker throughout sessions.  Patient ambulates 150 feet rolling walker contact-guard assist level.  Demonstration of how to perform safe car transfers with use of rolling walker.  Patient and her family able to perform return demonstration with patient requiring contact-guard assist for safety and stimulation height of patient G.  Patient and family able to perform return demonstration with patient requiring minimal assist for safety for stepstool.  Dressing needed total assist to don teds and abdominal binder.  Supervision for toileting hygiene requires mod assist to pull up pants.  Patient able to complete standing grooming task at sink with close contact guard assist.  Full family teaching completed and discharged home  Disposition: Discharge to home    Diet: Regular  Special Instructions: No driving smoking or alcohol  Medications at discharge 1.  Tylenol as needed 2.  Eliquis 5 mg p.o. twice daily 3.  Dulcolax suppository daily as needed 4.   Enablex 7.5 mg p.o. daily 5.  Florinef 0.2 mg p.o. daily 6.  Claritin 10 mg p.o. daily 7.  Robaxin 500 mg p.o. every 8 hours as needed muscle spasms 8.  Multivitamin daily 9.  Oxycodone 5 to 10 mg every 4 hours as needed pain 10.  Protonix 40 mg p.o. twice daily 11.  Senokot S2 tablets p.o. twice daily hold for loose stools 12.  Tramadol 100 mg every 8 hours x1 week and stop  Discharge Instructions    Ambulatory referral to Physical Medicine Rehab   Complete by: As directed    Moderate complexity follow-up 1 to 2 weeks SCI/ [place] C      Follow-up Information    Lovorn, Jinny Blossom, MD Follow up.   Specialty: Physical Medicine and Rehabilitation Why: Office to call for appointment Contact information: [address] [place] [address] [phone]        Earnie Larsson, MD Follow up.   Specialty: Neurosurgery Why: Call for appointment Contact information: [address] [place] [address] [phone]        Ronald Lobo, MD Follow up.   Specialty: Gastroenterology Why: Call for appointment as needed Contact information: [address] [place] [address] [phone]           Signed: Lavon Paganini Wentworth 02/04/2020, 5:25 AM

## 2020-02-03 NOTE — Progress Notes (Signed)
Physical Therapy Session Note  Patient Details  Name: Anna Campos MRN: [id number] Date of Birth: 1966/06/09  Today's Date: 02/03/2020 PT Individual Time: 0945-1100 PT Individual Time Calculation (min): 75 min   Short Term Goals: Week 3:  PT Short Term Goal 1 (Week 3): =LTG due to ELOS  Skilled Therapeutic Interventions/Progress Updates:    Pt received seated in recliner in room, agreeable to PT session. No complaints of pain at rest, does have onset of L trap pain during session, hot pack provided for pain relief. Pt is CGA for sit to stand transfer throughout session. Ambulation x 200 ft with RW at CGA level. Pt exhibits improved endurance for gait with RW this date. Provided handout for HEP of standing BLE strengthening therex: marches, HS curls, hip abd/ext/flex, and mini-squats. Pt requires RW or countertop for balance during standing therex and will need Supervision assist from family with standing activities. Pt then requests to work on [facility] activities. While seated at table in w/c pt able to build [place] tower with increased time with use of BUE. Pt then able to engage in [place] game with use of alt UE to remove playing pieces. Pt agreeable to stay seated in recliner at end of session. Stand pivot transfer w/c to recliner with RW and CGA. Pt left seated in recliner in room with needs in reach at end of session.  Therapy Documentation Precautions:  Precautions Precautions: Fall, Cervical Precaution Comments: reviewed cervical precautions; orthostatic Required Braces or Orthoses: Cervical Brace Cervical Brace: Soft collar(collar can be removed for showering per Dr. Berline Chough) Restrictions Weight Bearing Restrictions: No Other Position/Activity Restrictions: collar at all times per chart    Therapy/Group: Individual Therapy   Peter Congo, PT, DPT  02/03/2020, 11:43 AM

## 2020-02-03 NOTE — Progress Notes (Signed)
Occupational Therapy Session Note  Patient Details  Name: Anna Campos MRN: [id number] Date of Birth: [birth year]  Today's Date: 02/03/2020 OT Individual Time: 0800-0900 OT Individual Time Calculation (min): 60 min    Short Term Goals: Week 4:  OT Short Term Goal 1 (Week 4): LTG=STG secondary to ELOS  Skilled Therapeutic Interventions/Progress Updates:  Pt received seated in recliner with RN present giving AM meds without ABD binder and thigh high TED hose despite pt reporting she asked NT to don. Assisted pt to w/c via stand pivot to w/c with light MIN A. Pt completed seated UB bathing at sink with set- up assist. Dr. Berline Chough enter during session and agreeable to trial only ABD binder today. Applied ABD binder with total A with no s/s of [place] during session. Pt completed seated oral care with set- up assist to open toothpaste and position toothbrush inside hole of sink. Pt requires additional assist to open deodorant d/t time mgmt. Pt completed seated UB dressing with MIN A to get head of shirt over c collar. MOD A for LB Dressing and increased time via figure four. CGA for sit<>stand with RW ; MOD A to pull pants up to waist line with pt able to pull up in front while OTA assisted with back. Pt complete functional mobility back to recliner with MINA - CGA with RW. Pt left seated in recliner with all needs within reach.   Therapy Documentation Precautions:  Precautions Precautions: Fall, Cervical Precaution Comments: reviewed cervical precautions; orthostatic Required Braces or Orthoses: Cervical Brace Cervical Brace: Soft collar(collar can be removed for showering per Dr. Berline Chough) Restrictions Weight Bearing Restrictions: No Other Position/Activity Restrictions: collar at all times per chart General:   Vital Signs:   Pain: Pt reports no pain during session.   Therapy/Group: Individual Therapy  Angelina Pih 02/03/2020, 10:54 AM

## 2020-02-03 NOTE — Progress Notes (Signed)
Social Work Patient ID: Anna Campos, female   DOB: 1966-01-23, 53 y.o.   MRN: [id number]   SW met with pt in room to follow-up about HHA preference. Pt prefers [facility]. 3in1 BSC delivered. Waiting on platform RW to be delivered.   Referral for HHPT/OT accepted by [facility] [id number]).   Loralee Pacas, MSW, [place] Office: [phone] Cell: [phone] Fax: [phone]

## 2020-02-03 NOTE — Discharge Instructions (Signed)
Inpatient Rehab Discharge Instructions  Anna Campos Discharge date and time: No discharge date for patient encounter.   Activities/Precautions/ Functional Status: Activity: activity as tolerated Diet: regular diet Wound Care: keep wound clean and dry Functional status:  ___ No restrictions     ___ Walk up steps independently ___ 24/7 supervision/assistance   ___ Walk up steps with assistance ___ Intermittent supervision/assistance  ___ Bathe/dress independently ___ Walk with walker     _x__ Bathe/dress with assistance ___ Walk Independently    ___ Shower independently ___ Walk with assistance    ___ Shower with assistance ___ No alcohol     ___ Return to work/school ________  COMMUNITY REFERRALS UPON DISCHARGE:    Home Health:   [facility]  Phone: [phone]    Medical Equipment/Items Ordered: 3in1 bedside commode, and rolling walker with bilateral platform attachments                                                 Agency/Supplier: [facility] [id number]   Special Instructions:  No driving smoking or alcohol  My questions have been answered and I understand these instructions. I will adhere to these goals and the provided educational materials after my discharge from the hospital.  Patient/Caregiver Signature _______________________________ Date __________  Clinician Signature _______________________________________ Date __________  Please bring this form and your medication list with you to all your follow-up doctor's appointments.   Information on my medicine - ELIQUIS (apixaban)  This medication education was reviewed with me or my healthcare representative as part of my discharge preparation.  The pharmacist that spoke with me during my hospital stay was:  Onnie Boer, RPH-CPP  Why was Eliquis prescribed for you? Eliquis was prescribed to treat blood clots that may have been found in the veins of  your legs (deep vein thrombosis) or in your lungs (pulmonary embolism) and to reduce the risk of them occurring again.  What do You need to know about Eliquis ? The starting dose is 10 mg (two 5 mg tablets) taken TWICE daily for the FIRST SEVEN (7) DAYS, then on (enter date)  01/18/20  the dose is reduced to ONE 5 mg tablet taken TWICE daily.  Eliquis may be taken with or without food.   Try to take the dose about the same time in the morning and in the evening. If you have difficulty swallowing the tablet whole please discuss with your pharmacist how to take the medication safely.  Take Eliquis exactly as prescribed and DO NOT stop taking Eliquis without talking to the doctor who prescribed the medication.  Stopping may increase your risk of developing a new blood clot.  Refill your prescription before you run out.  After discharge, you should have regular check-up appointments with your healthcare provider that is prescribing your Eliquis.    What do you do if you miss a dose? If a dose of ELIQUIS is not taken at the scheduled time, take it as soon as possible on the same day and twice-daily administration should be resumed. The dose should not be doubled to make up for a missed dose.  Important Safety Information A possible side effect of Eliquis is bleeding. You should call your  healthcare provider right away if you experience any of the following: ? Bleeding from an injury or your nose that does not stop. ? Unusual colored urine (red or dark brown) or unusual colored stools (red or black). ? Unusual bruising for unknown reasons. ? A serious fall or if you hit your head (even if there is no bleeding).  Some medicines may interact with Eliquis and might increase your risk of bleeding or clotting while on Eliquis. To help avoid this, consult your healthcare provider or pharmacist prior to using any new prescription or non-prescription medications, including herbals, vitamins,  non-steroidal anti-inflammatory drugs (NSAIDs) and supplements.  This website has more information on Eliquis (apixaban): http://www.eliquis.com/eliquis/home

## 2020-02-03 NOTE — Progress Notes (Signed)
Physical Therapy Session Note  Patient Details  Name: Anna Campos MRN: [id number] Date of Birth: November 06, 1965  Today's Date: 02/03/2020 PT Individual Time: 7616-0737 PT Individual Time Calculation (min): 58 min   Short Term Goals: Week 3:  PT Short Term Goal 1 (Week 3): =LTG due to ELOS  Skilled Therapeutic Interventions/Progress Updates: Pt presents sitting in recliner, agreeable to therapy.  Pt required CGA and occasional verbal cues for initiation sit to stand.  Pt amb approx. 15' in room to w/c w/ RW.  Wheeled pt to gym for energy and time conservation.  Pt performed sit to stand and SPT w/c >Nu-step w/ CGA w/o LOB.  Pt performed Nu-step for bilateral LE strength and endurance at Level 5 for 10' w/o c/o.  Pt amb multiple trials w/ bilateral platform walker and CGA to SBA up to at least 200'  Pt requires verbal cues for visual scanning, increased BOS and especially proper right LE advancement.  Pt tends to try to step too far w/ right LE.  Pt amb over changing surface (carpet <> tile) in ADL room and CGA for sit <> stand from couch.  Increased education for initiation, especially for maintaining forward lean as tends to end up on heels.  Pt negotiated 4 8" steps w/ bilateral rails w/ verbal cueing for safety to increase independence.  Pt performed w/ CGA only.  Pt amb in hallways and back to room and wished to continue sitting in recliner.  LEs elevated and all needs in reach.     Therapy Documentation Precautions:  Precautions Precautions: Fall, Cervical Precaution Comments: reviewed cervical precautions; orthostatic Required Braces or Orthoses: Cervical Brace Cervical Brace: Soft collar(collar can be removed for showering per Dr. Berline Chough) Restrictions Weight Bearing Restrictions: No Other Position/Activity Restrictions: collar at all times per chart General:   Vital Signs: Therapy Vitals Temp: 98.1 F (36.7 C) Temp Source: Oral Pulse Rate: 90 Resp: 18 BP: (!) 89/64 Patient  Position (if appropriate): Sitting Oxygen Therapy SpO2: 100 % O2 Device: Room Air Pain:  0/10 Mobility:        Therapy/Group: Individual Therapy  [name] 02/03/2020, 3:06 PM

## 2020-02-03 NOTE — Plan of Care (Signed)
  Problem: Consults Goal: RH SPINAL CORD INJURY PATIENT EDUCATION Description:  See Patient Education module for education specifics.  Outcome: Progressing   Problem: SCI BOWEL ELIMINATION Goal: RH STG MANAGE BOWEL WITH ASSISTANCE Description: STG Manage Bowel with mod I Assistance. Outcome: Progressing   Problem: SCI BLADDER ELIMINATION Goal: RH STG MANAGE BLADDER WITH ASSISTANCE Description: STG Manage Bladder With Mod I Assistance Outcome: Progressing   Problem: RH SKIN INTEGRITY Goal: RH STG MAINTAIN SKIN INTEGRITY WITH ASSISTANCE Description: STG Maintain Skin Integrity With mod I Assistance. Outcome: Progressing   Problem: RH SAFETY Goal: RH STG ADHERE TO SAFETY PRECAUTIONS W/ASSISTANCE/DEVICE Description: STG Adhere to Safety Precautions With cues and reminders  Outcome: Progressing Goal: RH STG DECREASED RISK OF FALL WITH ASSISTANCE Description: STG Decreased Risk of Fall With mod I Assistance. Outcome: Progressing   Problem: RH PAIN MANAGEMENT Goal: RH STG PAIN MANAGED AT OR BELOW PT'S PAIN GOAL Description: Pain level less than 4 on scale of 0-10 Outcome: Progressing   Problem: RH KNOWLEDGE DEFICIT SCI Goal: RH STG INCREASE KNOWLEDGE OF SELF CARE AFTER SCI Description: Pt will be able to manage bowel program with min assist from family. Pt will be able to adhere to medication regimen with mod I assist from family.  Outcome: Progressing   

## 2020-02-03 NOTE — Progress Notes (Signed)
[facility] PHYSICAL MEDICINE & REHABILITATION PROGRESS NOTE   Subjective/Complaints:  Pt reports no orthostatic BP issues- x 5+ days- even in shower- Wants to try without TEDs if possible- I agree.  Bowels working daily again.     ROS:   Pt denies SOB, abd pain, CP, N/V/C/D, and vision changes   Objective:   No results found. Recent Labs    02/01/20 0501  WBC 5.9  HGB 10.3*  HCT 33.0*  PLT 310   Recent Labs    02/01/20 0501  NA 139  K 4.1  CL 102  CO2 29  GLUCOSE 90  BUN 16  CREATININE 0.74  CALCIUM 9.6    Intake/Output Summary (Last 24 hours) at 02/03/2020 1048 Last data filed at 02/03/2020 0730 Gross per 24 hour  Intake 930 ml  Output --  Net 930 ml     Physical Exam: Vital Signs Blood pressure 100/70, pulse 69, temperature 98.1 F (36.7 C), temperature source Oral, resp. rate 16, height 5\' 9"  (1.753 m), weight 54.8 kg, SpO2 100 %.  Constitutional: sitting up in manual w/c, getting self dressed with min assist to get shirt on; NAD HEENT:missing teeth- extracted Neck: soft collar in place; incision healed Cardiovascular: RRR- no JVD Respiratory/Chest: CTA B/L- no W/R/R- good air movement GI/Abdomen: soft, NT, ND; (+)BS Ext: no clubbing, cyanosis, or edema Psych: bright, cheerful Musc: no LE edema Neuro: Ox3 Motor: Muscles in upper traps, levators, and scalenes looser, but not loose yet RUE: Shoulder abduction 2+/5, elbow flexion/extension 3+/5, handgrip 2/5,  Finger abd 1/5 LUE: Shoulder abduction 2+/5 , elbow flexion/extension 3/5, and grip 3-/5; finger abd 1/5 Left lower extremity: 4/5 proximal distal Right lower extremity: Hip flexion, knee extension 3 to 3+/5, ankle dorsiflexion 3/5  Assessment/Plan: 1. Functional deficits secondary to C5 ASIA D due to osteomyelitis which require 3+ hours per day of interdisciplinary therapy in a comprehensive inpatient rehab setting.  Physiatrist is providing close team supervision and 24 hour management of  active medical problems listed below.  Physiatrist and rehab team continue to assess barriers to discharge/monitor patient progress toward functional and medical goals  Care Tool:  Bathing    Body parts bathed by patient: Right arm, Left arm, Chest, Abdomen, Front perineal area, Buttocks, Right upper leg, Left upper leg, Right lower leg, Left lower leg, Face   Body parts bathed by helper: Right lower leg, Left lower leg     Bathing assist Assist Level: Set up assist     Upper Body Dressing/Undressing Upper body dressing   What is the patient wearing?: Pull over shirt    Upper body assist Assist Level: Minimal Assistance - Patient > 75%    Lower Body Dressing/Undressing Lower body dressing      What is the patient wearing?: Pants, Underwear/pull up     Lower body assist Assist for lower body dressing: Maximal Assistance - Patient 25 - 49%     Toileting Toileting    Toileting assist Assist for toileting: Maximal Assistance - Patient 25 - 49%     Transfers Chair/bed transfer  Transfers assist     Chair/bed transfer assist level: Minimal Assistance - Patient > 75%     Locomotion Ambulation   Ambulation assist      Assist level: Contact Guard/Touching assist Assistive device: Walker-rolling Max distance: 150'   Walk 10 feet activity   Assist     Assist level: Contact Guard/Touching assist Assistive device: Walker-rolling   Walk 50 feet activity  Assist Walk 50 feet with 2 turns activity did not occur: Safety/medical concerns  Assist level: Contact Guard/Touching assist Assistive device: Walker-rolling    Walk 150 feet activity   Assist Walk 150 feet activity did not occur: Safety/medical concerns  Assist level: Contact Guard/Touching assist Assistive device: Walker-rolling    Walk 10 feet on uneven surface  activity   Assist Walk 10 feet on uneven surfaces activity did not occur: Safety/medical concerns   Assist level: Minimal  Assistance - Patient > 75% Assistive device: Walker-platform   Wheelchair     Assist Will patient use wheelchair at discharge?: Yes Type of Wheelchair: Manual    Wheelchair assist level: Supervision/Verbal cueing Max wheelchair distance: 35'    Wheelchair 50 feet with 2 turns activity    Assist        Assist Level: Dependent - Patient 0%   Wheelchair 150 feet activity     Assist      Assist Level: Dependent - Patient 0%   Blood pressure 100/70, pulse 69, temperature 98.1 F (36.7 C), temperature source Oral, resp. rate 16, height 5\' 9"  (1.753 m), weight 54.8 kg, SpO2 100 %.  Medical Problem List and Plan: 1.  Quadriplegia secondary to C5 incomplete ASIA C initially, now D spinal cord injury.S/P C3-4 4-55-6 and C6-7 anterior cervical decompression and fusion 01/04/2020.  Cervical brace as directed  Continue CIR   Precautions: Fall, cervical  -advised her to discuss dc date with her primary team this week  4/5- pt asking to leave early- after d/w pt risks/benefits of going/staying, she decided wanted to wait til 4/17 to leave- strength slightly improving.  2.  Antithrombotics/pulmonary emboli per CT angiogram of chest 12/20/2019: Lower extremity Doppler negative.   -DVT/anticoagulation: Intravenous heparin transitioned to Eliquis 01/11/2020             -antiplatelet therapy: N/A 3. Pain Management: Oxycodone and Robaxin as needed  4/8- pt reports pain not controlled anymore- used to be- but burning and aching- esp in neck and hips and back- will try to add tramadol 100 mg q8 hours for pain- also counseled can take 10 mg oxy IR-   4/9- pain MUCH better controlled- although would benefit from TrP injections, hates needles- wants TOM to do myofascial release- con't tramadol/oxy IR  4/13- pain well controlled with tramadol added  4. Mood: Provide emotional support             -antipsychotic agents: N/A 5. Neuropsych: This patient is capable of making decisions on her own  behalf. 6. Skin/Wound Care: Routine skin checks 7. Fluids/Electrolytes/Nutrition: Routine in and outs 8.  Anemia.  EGD completed 12/27/2019 per GI services.  Recommend PPI x2 months.  H. pylori serology positive.  Complete course of amoxicillin and Biaxin  Hemoglobin 11.0 on 3/24   4/1- pt wants iron- will start Ferrous gluconate  4/2- tolerating iron so far- had bowel program last night for some reason will verify with nursing.   4/5- off bowel program- going daily most days  4/15- doing better off Iron- less constipation   Continue to monitor 9. Multiple dental caries.  Follow-up dental services after multiple extractions 12/30/2019 10. Neurogenic bowel due to SCI.    Continue bowel program  3/29 - will change to QOD per pt's wishes/bowel habits  3/30- pt now able to control BMs so will stop bowel program  4/8- will increase senokot-s to 2 tabs BID and d/c Iron since so constipated, and adding Tramadol  4/9- LBM  2 nights ago- con't meds- hopeful she won't get backed up again.  4/10: BM last night as well  4/13- BM q1-2 days- no constipation per pt  4/15- daily BMs 11 Streptococcal bacteremia.  Intravenous ceftriaxone completed. 12.  History of tobacco use.  Counseling 13. Spasticity- improved since surgery- for now, doesn't need spasticity agents-  14. Neurogenic bladder/frequency  Pt was started on Flomax- working well- no caths required in last 24 hours- will monitor.   PVRs with low volumes  Urinary frequency improving, cont Enablex 7.5 on 3/27  3/30- voiding well- PVRs look great- will stop PVRs after makes sure not retaining with cessation of flomax- con't Enablex- for urgency- will stop Flomax and make sure she's not retaining.   4/2- not retaining- stopped PVRs 15.  Hyponatremia  Sodium 133 on 3/24  16.  Transaminitis  LFTs elevated on 3/24   3/29- LFTs increase has resolved 17. Orthostatic hypotension  4/1- will add TEDS, abd binder and Florinef 0.1 mg daily since was so low  and still low this AM  4/2- advised pt needs to continue Florinef- Sx's due to low BP, NOT medicine- clammy/dizzy- will con't  4/4 BP's look good again today  4/5- Having Bps of 90/50s with PT with standing- asymptomatic- will always be low, but if doesn't need more meds, won't increase.   4/6- they are trying to use abd binder, florinef and ACE wraps- encouraged pt if no Sx's, to go without ACE wraps since they are uncomfortable sometimes.   4/7- explained to team, if no Sx's, no worries over low BP- also moved florinef to 6:30am so gets before AM therapy.  4/9- asymptomatic with low BP this AM- con't florinef  4/11: continues to be asymptomatic with low BP. Continue use of TEDs and abdominal binder.    4/13- will con't Florinef til after d/c- has lower BP but asymptomatic- showering without abd binder and TEDs and tolerating. con't regimen  4/14- no orthostasis- will con't Florinef until d/c- her BP is still running 80-90s/50s, but not symptomatic- explained it's normal for SCI patients.  4/15- will stop TEDs today- see how BP goes- if so, can stop TEDs- con't abd binder  18. Sexuality/intimacy in SCI patient  4/8- discussed for 15 minutes about positions, sensation, orgasms and how to accomplish those topics.  19. Dispo  4/8- also advised no EtOH on pain meds- once off meds, can have ONE drink total in 24 hour period.  4/13- family training this week.   4/14- Social Security phone interview today  4/15- d/c Saturday- family training today    LOS: 23 days A FACE TO FACE EVALUATION WAS PERFORMED  Anna Campos 02/03/2020, 10:48 AM

## 2020-02-04 ENCOUNTER — Inpatient Hospital Stay (HOSPITAL_COMMUNITY): Payer: Commercial Managed Care - PPO | Admitting: Physical Therapy

## 2020-02-04 ENCOUNTER — Ambulatory Visit (HOSPITAL_COMMUNITY): Payer: Commercial Managed Care - PPO | Admitting: *Deleted

## 2020-02-04 ENCOUNTER — Inpatient Hospital Stay (HOSPITAL_COMMUNITY): Payer: Commercial Managed Care - PPO

## 2020-02-04 MED ORDER — DARIFENACIN HYDROBROMIDE ER 7.5 MG PO TB24: 7.5000 mg | ORAL_TABLET | Freq: Every day | ORAL | 0 refills | Status: DC

## 2020-02-04 MED ORDER — PANTOPRAZOLE SODIUM 40 MG PO TBEC: 40.0000 mg | DELAYED_RELEASE_TABLET | Freq: Two times a day (BID) | ORAL | 0 refills | Status: DC

## 2020-02-04 MED ORDER — ACETAMINOPHEN 325 MG PO TABS: 650.0000 mg | ORAL_TABLET | ORAL | Status: DC | PRN

## 2020-02-04 MED ORDER — SENNOSIDES-DOCUSATE SODIUM 8.6-50 MG PO TABS: 2.0000 | ORAL_TABLET | Freq: Two times a day (BID) | ORAL | Status: DC

## 2020-02-04 MED ORDER — OXYCODONE HCL 5 MG PO TABS: 5.0000 mg | ORAL_TABLET | ORAL | 0 refills | Status: DC | PRN

## 2020-02-04 MED ORDER — METHOCARBAMOL 500 MG PO TABS: 500.0000 mg | ORAL_TABLET | Freq: Three times a day (TID) | ORAL | 0 refills | Status: DC | PRN

## 2020-02-04 MED ORDER — TRAMADOL HCL 50 MG PO TABS: 100.0000 mg | ORAL_TABLET | Freq: Three times a day (TID) | ORAL | 0 refills | Status: DC

## 2020-02-04 MED ORDER — APIXABAN 5 MG PO TABS: 5.0000 mg | ORAL_TABLET | Freq: Two times a day (BID) | ORAL | 1 refills | Status: DC

## 2020-02-04 MED ORDER — BISACODYL 10 MG RE SUPP: 10.0000 mg | Freq: Every day | RECTAL | 0 refills | Status: DC | PRN

## 2020-02-04 MED ORDER — LORATADINE 10 MG PO TABS: 10.0000 mg | ORAL_TABLET | Freq: Every day | ORAL | 0 refills | Status: DC

## 2020-02-04 MED ORDER — FLUDROCORTISONE ACETATE 0.1 MG PO TABS: 0.2000 mg | ORAL_TABLET | Freq: Every day | ORAL | 0 refills | Status: DC

## 2020-02-04 NOTE — Progress Notes (Signed)
Occupational Therapy Session Note  Patient Details  Name: Anna Campos MRN: [id number] Date of Birth: 09-Sep-1966  Today's Date: 02/04/2020 OT Individual Time: 1100-1200 OT Individual Time Calculation (min): 60 min    Short Term Goals: Week 4:  OT Short Term Goal 1 (Week 4): LTG=STG secondary to ELOS  Skilled Therapeutic Interventions/Progress Updates:  Pt received seated in recliner agreeable to OT session. Session conducted with rec therapist with a focus on functional mobility related to community reintegration. Pt transported to [employer] from w/c with total A for time mgmt. Pt complete functional mobility with PFRW with CGA through atrium area. Pt practice sit<>stand from various surfaces heights and both compliant and noncompliant surfaces needing MINA - CGA for sit<>stand. Education provided on various safety implications related to community mobility with pt verbalizing understanding. Pt able to simulate toilet transfer in public restroom with PFRW. Pt complete functional mobility in restaurant setting and able to order food at register with CGA. Pt required 1 seated rest break while waiting to order. Education provided on utilizing RW bag for safety. Pt able to ambulate back to room and on off elevated with CGA- MINA with increased time. Pt required set- up assist for self feeding. Pt left seated in recliner with all needs within reach.   Therapy Documentation Precautions:  Precautions Precautions: Fall, Cervical Precaution Comments: reviewed cervical precautions; orthostatic Required Braces or Orthoses: Cervical Brace Cervical Brace: Soft collar Restrictions Weight Bearing Restrictions: No Other Position/Activity Restrictions: collar at all times per chart General:   Vital Signs:   Pain: Pt reports no pain during session.   Therapy/Group: Individual Therapy  Angelina Pih 02/04/2020, 3:12 PM

## 2020-02-04 NOTE — Progress Notes (Signed)
Recreational Therapy Discharge Summary Patient Details  Name: Mattelyn Imhoff MRN: [id number] Date of Birth: Sep 13, 1966 Today's Date: 02/04/2020  Long term goals set: 2  Long term goals met: 2  Comments on progress toward goals: Pt has made great progress during LOS and is excited about discharge home tomorrow.  Pt met min assist level for TR tasks and community reintegration standing/ambulatory level.  TR session focused on activity analysis identifying potential adaptations, activity tolerance, dynamic standing balance.  Goals met.   Reasons for discharge: discharge from hospital  Patient/family agrees with progress made and goals achieved: Yes  Cathe Bilger,Haidyn 02/04/2020, 12:45 PM

## 2020-02-04 NOTE — Progress Notes (Signed)
[facility] PHYSICAL MEDICINE & REHABILITATION PROGRESS NOTE   Subjective/Complaints:  Pt reports did OK yesterday without TEDs, but this AM, felt dizzy/lightheaded- wasn't wearing abd binder at time- but didn't tolerate shower after that without abd binder.  Ready for d/c tomorrow- bowels daily..  ROS:   Pt denies SOB, abd pain, CP, N/V/C/D, and vision changes   Objective:   No results found. No results for input(s): WBC, HGB, HCT, PLT in the last 72 hours. No results for input(s): NA, K, CL, CO2, GLUCOSE, BUN, CREATININE, CALCIUM in the last 72 hours.  Intake/Output Summary (Last 24 hours) at 02/04/2020 1632 Last data filed at 02/04/2020 1246 Gross per 24 hour  Intake 520 ml  Output -  Net 520 ml     Physical Exam: Vital Signs Blood pressure 101/69, pulse 78, temperature 98.8 F (37.1 C), temperature source Oral, resp. rate 18, height 5\' 9"  (1.753 m), weight 54.7 kg, SpO2 100 %.  Constitutional: getting out of shower with OT, appropriate, NAD HEENT:missing teeth- extracted Neck: incision healed Cardiovascular: RRR Respiratory/Chest: CTA B/L- no W/R/R- good air movement GI/Abdomen: Soft, NT, ND, (+)BS  Ext: no clubbing, cyanosis, or edema Psych: bright, appropriate Musc: no LE edema Neuro: Ox3 Motor: Muscles in upper traps, levators, and scalenes looser, but not loose yet RUE: Shoulder abduction 2+/5, elbow flexion/extension 3+/5, handgrip 2/5,  Finger abd 1/5 LUE: Shoulder abduction 2+/5 , elbow flexion/extension 3/5, and grip 3-/5; finger abd 1/5 Left lower extremity: 4/5 proximal distal Right lower extremity: Hip flexion, knee extension 3 to 3+/5, ankle dorsiflexion 3/5  Assessment/Plan: 1. Functional deficits secondary to C5 ASIA D due to osteomyelitis which require 3+ hours per day of interdisciplinary therapy in a comprehensive inpatient rehab setting.  Physiatrist is providing close team supervision and 24 hour management of active medical problems listed  below.  Physiatrist and rehab team continue to assess barriers to discharge/monitor patient progress toward functional and medical goals  Care Tool:  Bathing    Body parts bathed by patient: Right arm, Left arm, Chest, Abdomen, Buttocks, Front perineal area, Right upper leg, Left upper leg, Right lower leg, Left lower leg, Face   Body parts bathed by helper: Right lower leg, Left lower leg     Bathing assist Assist Level: Set up assist     Upper Body Dressing/Undressing Upper body dressing   What is the patient wearing?: Pull over shirt    Upper body assist Assist Level: Minimal Assistance - Patient > 75%    Lower Body Dressing/Undressing Lower body dressing      What is the patient wearing?: Pants     Lower body assist Assist for lower body dressing: Moderate Assistance - Patient 50 - 74%     Toileting Toileting    Toileting assist Assist for toileting: Moderate Assistance - Patient 50 - 74%     Transfers Chair/bed transfer  Transfers assist     Chair/bed transfer assist level: Contact Guard/Touching assist     Locomotion Ambulation   Ambulation assist      Assist level: Contact Guard/Touching assist Assistive device: Walker-platform Max distance: 200'   Walk 10 feet activity   Assist     Assist level: Contact Guard/Touching assist Assistive device: Walker-platform   Walk 50 feet activity   Assist Walk 50 feet with 2 turns activity did not occur: Safety/medical concerns  Assist level: Contact Guard/Touching assist Assistive device: Walker-platform    Walk 150 feet activity   Assist Walk 150 feet activity did  not occur: Safety/medical concerns  Assist level: Contact Guard/Touching assist Assistive device: Walker-platform    Walk 10 feet on uneven surface  activity   Assist Walk 10 feet on uneven surfaces activity did not occur: Safety/medical concerns   Assist level: Minimal Assistance - Patient > 75% Assistive device:  Walker-platform   Wheelchair     Assist Will patient use wheelchair at discharge?: No Type of Wheelchair: Manual    Wheelchair assist level: Supervision/Verbal cueing Max wheelchair distance: 31'    Wheelchair 50 feet with 2 turns activity    Assist        Assist Level: Dependent - Patient 0%   Wheelchair 150 feet activity     Assist      Assist Level: Dependent - Patient 0%   Blood pressure 101/69, pulse 78, temperature 98.8 F (37.1 C), temperature source Oral, resp. rate 18, height 5\' 9"  (1.753 m), weight 54.7 kg, SpO2 100 %.  Medical Problem List and Plan: 1.  Quadriplegia secondary to C5 incomplete ASIA C initially, now D spinal cord injury.S/P C3-4 4-55-6 and C6-7 anterior cervical decompression and fusion 01/04/2020.  Cervical brace as directed  Continue CIR   Precautions: Fall, cervical  -advised her to discuss dc date with her primary team this week  4/5- pt asking to leave early- after d/w pt risks/benefits of going/staying, she decided wanted to wait til 4/17 to leave- strength slightly improving.  2.  Antithrombotics/pulmonary emboli per CT angiogram of chest 12/20/2019: Lower extremity Doppler negative.   -DVT/anticoagulation: Intravenous heparin transitioned to Eliquis 01/11/2020             -antiplatelet therapy: N/A 3. Pain Management: Oxycodone and Robaxin as needed  4/8- pt reports pain not controlled anymore- used to be- but burning and aching- esp in neck and hips and back- will try to add tramadol 100 mg q8 hours for pain- also counseled can take 10 mg oxy IR-   4/9- pain MUCH better controlled- although would benefit from TrP injections, hates needles- wants TOM to do myofascial release- con't tramadol/oxy IR  4/13- pain well controlled with tramadol added  4. Mood: Provide emotional support             -antipsychotic agents: N/A 5. Neuropsych: This patient is capable of making decisions on her own behalf. 6. Skin/Wound Care: Routine skin  checks 7. Fluids/Electrolytes/Nutrition: Routine in and outs 8.  Anemia.  EGD completed 12/27/2019 per GI services.  Recommend PPI x2 months.  H. pylori serology positive.  Complete course of amoxicillin and Biaxin  Hemoglobin 11.0 on 3/24   4/1- pt wants iron- will start Ferrous gluconate  4/2- tolerating iron so far- had bowel program last night for some reason will verify with nursing.   4/5- off bowel program- going daily most days  4/15- doing better off Iron- less constipation   Continue to monitor 9. Multiple dental caries.  Follow-up dental services after multiple extractions 12/30/2019 10. Neurogenic bowel due to SCI.    Continue bowel program  3/29 - will change to QOD per pt's wishes/bowel habits  3/30- pt now able to control BMs so will stop bowel program  4/8- will increase senokot-s to 2 tabs BID and d/c Iron since so constipated, and adding Tramadol  4/9- LBM 2 nights ago- con't meds- hopeful she won't get backed up again.  4/10: BM last night as well  4/13- BM q1-2 days- no constipation per pt  4/15- daily BMs  4/16- daily BMs 11  Streptococcal bacteremia.  Intravenous ceftriaxone completed. 12.  History of tobacco use.  Counseling 13. Spasticity- improved since surgery- for now, doesn't need spasticity agents-  14. Neurogenic bladder/frequency  Pt was started on Flomax- working well- no caths required in last 24 hours- will monitor.   PVRs with low volumes  Urinary frequency improving, cont Enablex 7.5 on 3/27  3/30- voiding well- PVRs look great- will stop PVRs after makes sure not retaining with cessation of flomax- con't Enablex- for urgency- will stop Flomax and make sure she's not retaining.   4/2- not retaining- stopped PVRs 15.  Hyponatremia  Sodium 133 on 3/24  16.  Transaminitis  LFTs elevated on 3/24   3/29- LFTs increase has resolved 17. Orthostatic hypotension  4/1- will add TEDS, abd binder and Florinef 0.1 mg daily since was so low and still low this  AM  4/2- advised pt needs to continue Florinef- Sx's due to low BP, NOT medicine- clammy/dizzy- will con't  4/4 BP's look good again today  4/5- Having Bps of 90/50s with PT with standing- asymptomatic- will always be low, but if doesn't need more meds, won't increase.   4/6- they are trying to use abd binder, florinef and ACE wraps- encouraged pt if no Sx's, to go without ACE wraps since they are uncomfortable sometimes.   4/7- explained to team, if no Sx's, no worries over low BP- also moved florinef to 6:30am so gets before AM therapy.  4/9- asymptomatic with low BP this AM- con't florinef  4/11: continues to be asymptomatic with low BP. Continue use of TEDs and abdominal binder.    4/13- will con't Florinef til after d/c- has lower BP but asymptomatic- showering without abd binder and TEDs and tolerating. con't regimen  4/14- no orthostasis- will con't Florinef until d/c- her BP is still running 80-90s/50s, but not symptomatic- explained it's normal for SCI patients.  4/15- will stop TEDs today- see how BP goes- if so, can stop TEDs- con't abd binder   4/16- OK without TEDS- needs abd binder when up/except shower.  18. Sexuality/intimacy in SCI patient  4/8- discussed for 15 minutes about positions, sensation, orgasms and how to accomplish those topics.  19. Dispo  4/8- also advised no EtOH on pain meds- once off meds, can have ONE drink total in 24 hour period.  4/13- family training this week.   4/14- Social Security phone interview today  4/15- d/c Saturday- family training today  4/16- d/c tomorrow    LOS: 24 days A FACE TO FACE EVALUATION WAS PERFORMED  Joycie Aerts 02/04/2020, 4:32 PM

## 2020-02-04 NOTE — Plan of Care (Signed)
  Problem: Consults Goal: RH SPINAL CORD INJURY PATIENT EDUCATION Description:  See Patient Education module for education specifics.  Outcome: Progressing   Problem: SCI BOWEL ELIMINATION Goal: RH STG MANAGE BOWEL WITH ASSISTANCE Description: STG Manage Bowel with mod I Assistance. Outcome: Progressing   Problem: SCI BLADDER ELIMINATION Goal: RH STG MANAGE BLADDER WITH ASSISTANCE Description: STG Manage Bladder With Mod I Assistance Outcome: Progressing   Problem: RH SKIN INTEGRITY Goal: RH STG MAINTAIN SKIN INTEGRITY WITH ASSISTANCE Description: STG Maintain Skin Integrity With mod I Assistance. Outcome: Progressing   Problem: RH SAFETY Goal: RH STG ADHERE TO SAFETY PRECAUTIONS W/ASSISTANCE/DEVICE Description: STG Adhere to Safety Precautions With cues and reminders  Outcome: Progressing Goal: RH STG DECREASED RISK OF FALL WITH ASSISTANCE Description: STG Decreased Risk of Fall With mod I Assistance. Outcome: Progressing   Problem: RH PAIN MANAGEMENT Goal: RH STG PAIN MANAGED AT OR BELOW PT'S PAIN GOAL Description: Pain level less than 4 on scale of 0-10 Outcome: Progressing   Problem: RH KNOWLEDGE DEFICIT SCI Goal: RH STG INCREASE KNOWLEDGE OF SELF CARE AFTER SCI Description: Pt will be able to manage bowel program with min assist from family. Pt will be able to adhere to medication regimen with mod I assist from family.  Outcome: Progressing   

## 2020-02-04 NOTE — Progress Notes (Signed)
Physical Therapy Discharge Summary  Patient Details  Name: Anna Campos MRN: [id number] Date of Birth: 09-15-1966  Today's Date: 02/04/2020 PT Individual Time: 1300-1415 PT Individual Time Calculation (min): 75 min    Patient has met 5 of 5 long term goals due to improved activity tolerance, improved balance, improved postural control, increased strength, increased range of motion, ability to compensate for deficits and improved coordination.  Patient to discharge at an ambulatory level [place].   Patient's care partner is independent to provide the necessary physical assistance at discharge. Pt's husband and daughter have completed hands-on education and are safe to assist pt upon d/c home.  Reasons goals not met: Patient has met all rehab goals.  Recommendation:  Patient will benefit from ongoing skilled PT services in home health setting to continue to advance safe functional mobility, address ongoing impairments in endurance, strength, coordination, balance, safety, independence with functional mobility, and minimize fall risk.  Equipment: PFRW  Reasons for discharge: treatment goals met and discharge from hospital  Patient/family agrees with progress made and goals achieved: Yes   Skilled Intervention: Pt received seated in recliner in room, agreeable to PT session. No complaints of pain. Sit to stand with CGA throughout session. Pt received equipment she will d/c home with. Assisted pt with setting up her [place]. Ambulation 2 x 200 ft at Supervision to CGA level with use of PFRW. Ambulation through obstacle course weaving through cones and stepping over obstacles with min A for balance and min cueing for safety and safe PFRW management. Seated balance and core strengthening performing "zoom ball" with use of BUE, focus on maintaining upright trunk. Pt requests to return to bed at end of session, mod I for bed mobility. Pt left semi-reclined in bed with needs in reach at end of  session.  PT Discharge Precautions/Restrictions Precautions Precautions: Fall;Cervical Precaution Comments: reviewed cervical precautions; orthostatic Required Braces or Orthoses: Cervical Brace Cervical Brace: Soft collar;At all times Restrictions Weight Bearing Restrictions: No Vision/Perception  Perception Perception: Within Functional Limits Praxis Praxis: Intact  Cognition Overall Cognitive Status: Within Functional Limits for tasks assessed Arousal/Alertness: Awake/alert Orientation Level: Oriented X4 Attention: Sustained;Selective Sustained Attention: Appears intact Selective Attention: Appears intact Memory: Appears intact Awareness: Appears intact Problem Solving: Appears intact Safety/Judgment: Appears intact Sensation Sensation Light Touch: Appears Intact Proprioception: Appears Intact Coordination Gross Motor Movements are Fluid and Coordinated: No Fine Motor Movements are Fluid and Coordinated: No Coordination and Movement Description: Pt will continued decreased BUE [place] and lower extremity ataxia, significant improvement since eval; WFL Motor  Motor Motor: Tetraplegia Motor - Discharge Observations: incomplete quadraparesis  Mobility Bed Mobility Bed Mobility: Rolling Right;Rolling Left;Supine to Sit;Sit to Supine Rolling Right: Independent with assistive device Rolling Left: Independent with assistive device Supine to Sit: Independent with assistive device Sit to Supine: Independent with assistive device Transfers Transfers: Sit to Stand;Stand to Sit;Stand Pivot Transfers Sit to Stand: Contact Guard/Touching assist Stand to Sit: Contact Guard/Touching assist Stand Pivot Transfers: Contact Guard/Touching assist Stand Pivot Transfer Details: Verbal cues for safe use of DME/AE Transfer (Assistive device): Bilateral platform walker Locomotion  Gait Ambulation: Yes Gait Assistance: Contact Guard/Touching assist Gait Distance (Feet): 200  Feet Assistive device: Bilateral platform walker Gait Gait: Yes Gait Pattern: Impaired Gait Pattern: Ataxic(increased step length RLE) Gait velocity: decreased Stairs / Additional Locomotion Stairs: Yes Stairs Assistance: Contact Guard/Touching assist Stair Management Technique: Two rails;Step to pattern Number of Stairs: 4 Height of Stairs: 6 Wheelchair Mobility Wheelchair Mobility: No  Trunk/Postural Assessment  Cervical Assessment Cervical Assessment: Exceptions to WFL(soft collar at all times; cervical precautions) Thoracic Assessment Thoracic Assessment: Within Functional Limits Lumbar Assessment Lumbar Assessment: Within Functional Limits Postural Control Postural Control: Within Functional Limits  Balance Balance Balance Assessed: Yes Static Sitting Balance Static Sitting - Balance Support: No upper extremity supported;Feet supported Static Sitting - Level of Assistance: 5: Stand by assistance Dynamic Sitting Balance Dynamic Sitting - Balance Support: No upper extremity supported;Feet supported;During functional activity Dynamic Sitting - Level of Assistance: 5: Stand by assistance Static Standing Balance Static Standing - Balance Support: Bilateral upper extremity supported;During functional activity Static Standing - Level of Assistance: 5: Stand by assistance Dynamic Standing Balance Dynamic Standing - Balance Support: During functional activity;No upper extremity supported Dynamic Standing - Level of Assistance: 5: Stand by assistance Extremity Assessment   RLE Assessment RLE Assessment: Within Functional Limits General Strength Comments: see below RLE Strength Right Hip Flexion: 4/5 Right Knee Flexion: 5/5 Right Knee Extension: 5/5 Right Ankle Dorsiflexion: 5/5 LLE Assessment LLE Assessment: Within Functional Limits General Strength Comments: see below LLE Strength Left Hip Flexion: 4/5 Left Knee Flexion: 5/5 Left Knee Extension: 5/5 Left Ankle  Dorsiflexion: 5/5     Excell Seltzer, PT, DPT 02/04/2020, 3:49 PM

## 2020-02-04 NOTE — Progress Notes (Signed)
Recreational Therapy Session Note  Patient Details  Name: Anna Campos MRN: [id number] Date of Birth: Apr 02, 1966 Today's Date: 02/04/2020 Time:  11-12 Pain: no c/o Skilled Therapeutic Interventions/Progress Updates:   Pt participated in simulated community reintegration/outing to hospital based [employer] at overall close supervision ambulatory level using PFRW.  Goals focused on safe community mobility including transfers to various seat heights, identification & negotiation of obstacles, accessing public restroom, energy conservation techniques/education.    Therapy/Group: Co-Treatment Arkie Tagliaferro,Phillip 02/04/2020, 12:38 PM

## 2020-02-04 NOTE — Progress Notes (Signed)
Occupational Therapy Session Note  Patient Details  Name: Anna Campos MRN: [id number] Date of Birth: Oct 31, 1965  Today's Date: 02/04/2020 OT Individual Time: 0800-0900 OT Individual Time Calculation (min): 60 min    Short Term Goals: Week 4:  OT Short Term Goal 1 (Week 4): LTG=STG secondary to ELOS  Skilled Therapeutic Interventions/Progress Updates:  Pt received in recliner without ABD binder despite pt reporting asking NT to don; RN aware. Pt reports having OH episode with NT while ambulating back from bathroom d/t binder not being applied. Applied ABD binder from chair with total A and took BP 83/59 with no s/s. Additional BP taken 20 mins later 84/62. Pt complete stand pivot transfer to reclining shower chair with light MIN A. Transferred pt to shower with total A. Pt completed bathing with set- up assist using LH sponge. Pt with no s/s of OH during shower. Transported pt out of shower and completed stand pivot transfer back to recliner with MIN A. Pt complete UB dressing with MIN A and LB dressing with MODA. Pt left up in recliner with all needs within reach.   Therapy Documentation Precautions:  Precautions Precautions: Fall, Cervical Precaution Comments: reviewed cervical precautions; orthostatic Required Braces or Orthoses: Cervical Brace Cervical Brace: Soft collar Restrictions Weight Bearing Restrictions: No Other Position/Activity Restrictions: collar at all times per chart General:   Vital Signs:   Pain: Pt reports no pain during session.    Therapy/Group: Individual Therapy  Angelina Pih 02/04/2020, 10:55 AM

## 2020-02-04 NOTE — Progress Notes (Signed)
Occupational Therapy Discharge Summary  Patient Details  Name: Anna Campos MRN: [id number] Date of Birth: 13-Oct-1966       Patient has met 9 of 10 long term goals due to improved activity tolerance, improved balance, postural control, ability to compensate for deficits, functional use of  RIGHT upper, RIGHT lower, LEFT upper and LEFT lower extremity and improved coordination. Overall, pt requires MIN A- CGA for functional mobility with RW. Pt completed toilet transfer with MIN A - CGA via ambulation with RW. MOD A for clothing management and hygiene. Pt requires MIN A for UB dressing, and MOD A for LB dressing. Pt requires set- up assist for grooming tasks as well as self- feeding using built up utensils. Pt complete shower transfers to TTB with CGA- MIN A with RW. Pt continues to present with decreased BUE [place] needing increased time and AE PRN to complete tasks. Pt family present for family education and demo'ed safe transfer techniques.  Patient to discharge at [facility] Assist level.  Patient's care partner is independent to provide the necessary physical assistance at discharge.    Reasons goals not met: pt didn't meet toileting goal as pt needed A for CM  Recommendation:  Patient will benefit from ongoing skilled OT services in home health setting to continue to advance functional skills in the area of BADL and iADL.  Equipment: 3n1, TTB, HHOT  Reasons for discharge: treatment goals met and discharge from hospital  Patient/family agrees with progress made and goals achieved: Yes  OT Discharge Precautions/Restrictions  Precautions Precautions: Fall;Cervical Precaution Comments: reviewed cervical precautions; orthostatic Required Braces or Orthoses: Cervical Brace Cervical Brace: Soft collar Restrictions Weight Bearing Restrictions: No Other Position/Activity Restrictions: collar at all times per chart General   Vital Signs   Pain Pain Assessment Pain Scale: 0-10 Pain  Score: 0-No pain ADL ADL Equipment Provided: Feeding equipment, Long-handled sponge, Other (comment)(bathmit) Eating: Set up Where Assessed-Eating: Chair Grooming: Setup Where Assessed-Grooming: Sitting at sink Upper Body Bathing: Setup Where Assessed-Upper Body Bathing: Shower Lower Body Bathing: Setup Where Assessed-Lower Body Bathing: Shower Upper Body Dressing: Minimal assistance Where Assessed-Upper Body Dressing: Chair Lower Body Dressing: Moderate assistance Where Assessed-Lower Body Dressing: Chair Toileting: Moderate assistance Where Assessed-Toileting: Glass blower/designer: Therapist, music Method: Counselling psychologist: Bedside commode, Other (comment)(BSC over toilet to increase height) Tub/Shower Transfer: Metallurgist Method: Ambulating Tub/Shower Equipment: Radio broadcast assistant Vision Baseline Vision/History: No visual deficits;Wears glasses Wears Glasses: Reading only Patient Visual Report: No change from baseline Vision Assessment?: No apparent visual deficits Perception  Perception: Within Functional Limits Praxis Praxis: Intact Cognition Overall Cognitive Status: Within Functional Limits for tasks assessed Arousal/Alertness: Awake/alert Orientation Level: Oriented X4 Attention: Sustained;Selective Sustained Attention: Appears intact Selective Attention: Appears intact Memory: Appears intact Awareness: Appears intact Problem Solving: Appears intact Safety/Judgment: Appears intact Sensation Sensation Light Touch: Appears Intact(appears intact in BUE) Hot/Cold: Appears Intact Proprioception: Appears Intact Stereognosis: Appears Intact Coordination Gross Motor Movements are Fluid and Coordinated: No Fine Motor Movements are Fluid and Coordinated: No Coordination and Movement Description: pt with continued BUE [facility] however WFL Motor  Motor Motor: Tetraplegia Motor - Discharge Observations:  incomplete quadraparesis Mobility  Bed Mobility Bed Mobility: Rolling Right;Rolling Left;Supine to Sit;Sit to Supine Rolling Right: Independent with assistive device Rolling Left: Independent with assistive device Supine to Sit: Independent with assistive device Sit to Supine: Independent with assistive device Transfers Sit to Stand: Minimal Assistance - Patient > 75% Stand to Sit: Minimal Assistance -  Patient > 75%  Trunk/Postural Assessment  Cervical Assessment Cervical Assessment: Exceptions to WFL(soft collar at all times; spinal precautions) Thoracic Assessment Thoracic Assessment: Within Functional Limits Lumbar Assessment Lumbar Assessment: Within Functional Limits Postural Control Postural Control: Within Functional Limits  Balance Static Sitting Balance Static Sitting - Balance Support: No upper extremity supported;Feet supported Static Sitting - Level of Assistance: 5: Stand by assistance Dynamic Sitting Balance Dynamic Sitting - Balance Support: No upper extremity supported;Feet supported;During functional activity Dynamic Sitting - Level of Assistance: 5: Stand by assistance Static Standing Balance Static Standing - Balance Support: Bilateral upper extremity supported;During functional activity Static Standing - Level of Assistance: 5: Stand by assistance Dynamic Standing Balance Dynamic Standing - Balance Support: During functional activity;No upper extremity supported Dynamic Standing - Level of Assistance: 5: Stand by assistance Extremity/Trunk Assessment RUE Assessment RUE Assessment: Within Functional Limits Passive Range of Motion (PROM) Comments: WFLS for all joints Active Range of Motion (AROM) Comments: AAROM WFLS for all joints General Strength Comments: elbow flexion/ extension 4/5; shoulder flexion/ extension 3+5; wrist flexion/extension 4/5; pt with functional grasp on RUE however RUE<LUE LUE Assessment LUE Assessment: Within Functional Limits Passive  Range of Motion (PROM) Comments: PROM WFls for all joints Active Range of Motion (AROM) Comments: AAROM WFLS for all joints General Strength Comments: shoulder flexion/ extension 3+/5; elbow flexion/ extension 3/5; wrist flexion extension 4/5; greater functional grasp on LUE vs RUE   Anna Campos 02/04/2020, 10:02 AM

## 2020-02-05 NOTE — Progress Notes (Signed)
Pt DC home today. Husband present. Belongings already taken home previously. No c/o pain. DC summary complete 02/04/20 by PA. Pt/husband has no questions/concerns. Transport to [employer].   Ross Ludwig, RN

## 2020-02-05 NOTE — Plan of Care (Signed)
  Problem: Consults Goal: RH SPINAL CORD INJURY PATIENT EDUCATION Description:  See Patient Education module for education specifics.  Outcome: Completed/Met   Problem: SCI BOWEL ELIMINATION Goal: RH STG MANAGE BOWEL WITH ASSISTANCE Description: STG Manage Bowel with mod I Assistance. Outcome: Completed/Met   Problem: SCI BLADDER ELIMINATION Goal: RH STG MANAGE BLADDER WITH ASSISTANCE Description: STG Manage Bladder With Mod I Assistance Outcome: Completed/Met   Problem: RH SKIN INTEGRITY Goal: RH STG MAINTAIN SKIN INTEGRITY WITH ASSISTANCE Description: STG Maintain Skin Integrity With mod I Assistance. Outcome: Completed/Met   Problem: RH SAFETY Goal: RH STG ADHERE TO SAFETY PRECAUTIONS W/ASSISTANCE/DEVICE Description: STG Adhere to Safety Precautions With cues and reminders  Outcome: Completed/Met Goal: RH STG DECREASED RISK OF FALL WITH ASSISTANCE Description: STG Decreased Risk of Fall With mod I Assistance. Outcome: Completed/Met   Problem: RH PAIN MANAGEMENT Goal: RH STG PAIN MANAGED AT OR BELOW PT'S PAIN GOAL Description: Pain level less than 4 on scale of 0-10 Outcome: Completed/Met   Problem: RH KNOWLEDGE DEFICIT SCI Goal: RH STG INCREASE KNOWLEDGE OF SELF CARE AFTER SCI Description: Pt will be able to manage bowel program with min assist from family. Pt will be able to adhere to medication regimen with mod I assist from family.  Outcome: Completed/Met

## 2020-02-07 ENCOUNTER — Telehealth: Payer: Self-pay

## 2020-02-07 NOTE — Progress Notes (Signed)
Social Work Discharge Note   The overall goal for the admission was met for:   Discharge location: Yes. D/c to home with 24/7 care from husband.   Length of Stay: Yes. 25 days  Discharge activity level: Yes. Minimal Assistance  Home/community participation: Yes. Limited  Services provided included: MD, RD, PT, OT, SLP, RN, CM, TR, Pharmacy, Neuropsych and SW  Financial Services: Private Insurance: UHC/UMR  Follow-up services arranged: Home Health: [place] for PT/OT, DME: [employer] for RW with bilateral platform attachments and 3in1 BSC and Patient/Family request agency HH: [facility], DME: N/A  Comments (or additional information): contact pt [phone] or pt husband Lanny Hurst [phone]  Patient/Family verbalized understanding of follow-up arrangements: Yes  Individual responsible for coordination of the follow-up plan: Pt will have assistance from her husband to coordinate care needs.   Confirmed correct DME delivered: Rana Snare 02/07/2020    Rana Snare

## 2020-02-07 NOTE — Telephone Encounter (Signed)
Transitional Care call-I spoke with Anna Campos    1. Are you/is patient experiencing any problems since coming home? Are there any questions regarding any aspect of care?NO 2. Are there any questions regarding medications administration/dosing? Are meds being taken as prescribed? Patient should review meds with caller to confirm ALL MEDICATIONS ACCOUNTED FOR AND NO QUESTIONS. 3. Have there been any falls?NO 4. Has Home Health been to the house and/or have they contacted you? If not, have you tried to contact them? Can we help you contact them?YES 5. Are bowels and bladder emptying properly? Are there any unexpected incontinence issues? If applicable, is patient following bowel/bladder programs?NO PROBLEM 6. Any fevers, problems with breathing, unexpected pain?NO 7. Are there any skin problems or new areas of breakdown? NO 8. Has the patient/family member arranged specialty MD follow up (ie cardiology/neurology/renal/surgical/etc)?  Can we help arrange? All appointments have been made with neuro and gastroenterology, appt give to see Dr Berline Chough. 9. Does the patient need any other services or support that we can help arrange?  NO 10. Are caregivers following through as expected in assisting the patient?YES 11. Has the patient quit smoking, drinking alcohol, or using drugs as recommended? N//A  Appointment Wednesday 02/16/20 @ 11:00, arrive by 10:30 for appointment with Dr Berline Chough.  Address reviewed and instructed to watch for packet in mail from our office --please fill out and sign before visit. Requested they bring pain medication bottles to visit.  [id number] [employer]

## 2020-02-09 ENCOUNTER — Telehealth: Payer: Self-pay | Admitting: *Deleted

## 2020-02-09 NOTE — Telephone Encounter (Signed)
Brandon from [facility] called for POC for 1wk1 2wk3 1wk1.  Approval given.

## 2020-02-10 ENCOUNTER — Telehealth: Payer: Self-pay

## 2020-02-10 ENCOUNTER — Telehealth: Payer: Self-pay | Admitting: *Deleted

## 2020-02-10 ENCOUNTER — Ambulatory Visit: Payer: Commercial Managed Care - PPO | Attending: Internal Medicine

## 2020-02-10 DIAGNOSIS — Z23 Encounter for immunization: Secondary | ICD-10-CM

## 2020-02-10 NOTE — Telephone Encounter (Signed)
Have CPR order/agree with therapy- thank you

## 2020-02-10 NOTE — Progress Notes (Signed)
   Covid-19 Vaccination Clinic  Name:  Elasia Furnish    MRN: [id number] DOB: April 15, 1966  02/10/2020  Ms. Sane was observed post Covid-19 immunization for 15 minutes without incident. She was provided with Vaccine Information Sheet and instruction to access the V-Safe system.   Ms. Matzen was instructed to call 911 with any severe reactions post vaccine: Marland Kitchen Difficulty breathing  . Swelling of face and throat  . A fast heartbeat  . A bad rash all over body  . Dizziness and weakness   Immunizations Administered    Name Date Dose VIS Date Route   Pfizer COVID-19 Vaccine 02/10/2020  5:10 PM 0.3 mL 12/15/2018 Intramuscular   Manufacturer: [employer], [employer]   Lot: [id number]   NDC: [address]-6

## 2020-02-10 NOTE — Telephone Encounter (Signed)
Karie Chimera [facility] wants verbal for PT 2 Wk 2 - 1 wk 1  Phone [phone]

## 2020-02-10 NOTE — Telephone Encounter (Signed)
She CAN get COVID shot on Eliquis, which is what's she's taking.  She just got home, so don't know if she's spoken to PCP about her PE's/Eliquis yet, however it's safe, it's just a small chance of a small bruise at site of injection- thanks

## 2020-02-10 NOTE — Telephone Encounter (Signed)
Sharisse called and says she is getting the Covid-19 injection but has been told if you are taking a blood thinner you cannot get the shot. I do not know who is managing her Eliquis outside of her hospital discharge. Can you advise? (the shot is scheduled for tomorrow 02/11/20 @4pm )

## 2020-02-11 MED ORDER — TRAMADOL HCL 50 MG PO TABS: 100.0000 mg | ORAL_TABLET | Freq: Three times a day (TID) | ORAL | 1 refills | Status: DC

## 2020-02-11 NOTE — Telephone Encounter (Signed)
Mrs Gowans notified. She reports that she is needing a refill on her tramadol.  She is not wanting to take the oxycodone.

## 2020-02-11 NOTE — Telephone Encounter (Signed)
Notified. 

## 2020-02-11 NOTE — Telephone Encounter (Signed)
Refilled Tramadol 100 mg TID/3x/day for pain. Can wean that as well, over time, if possible. Suggest to pt.   Refilled #180 with 1 RF Will see her when has f/u.

## 2020-02-11 NOTE — Telephone Encounter (Signed)
Orders approved and given. 

## 2020-02-16 ENCOUNTER — Encounter: Payer: Self-pay | Admitting: Physical Medicine and Rehabilitation

## 2020-02-16 ENCOUNTER — Encounter
Payer: Commercial Managed Care - PPO | Attending: Physical Medicine and Rehabilitation | Admitting: Physical Medicine and Rehabilitation

## 2020-02-16 ENCOUNTER — Other Ambulatory Visit: Payer: Self-pay

## 2020-02-16 VITALS — BP 97/62 | HR 96 | Temp 97.7°F | Ht 69.0 in | Wt 125.0 lb

## 2020-02-16 DIAGNOSIS — M792 Neuralgia and neuritis, unspecified: Secondary | ICD-10-CM | POA: Diagnosis present

## 2020-02-16 DIAGNOSIS — R252 Cramp and spasm: Secondary | ICD-10-CM | POA: Diagnosis present

## 2020-02-16 DIAGNOSIS — R269 Unspecified abnormalities of gait and mobility: Secondary | ICD-10-CM | POA: Diagnosis not present

## 2020-02-16 DIAGNOSIS — G8254 Quadriplegia, C5-C7 incomplete: Secondary | ICD-10-CM | POA: Insufficient documentation

## 2020-02-16 MED ORDER — BACLOFEN 10 MG PO TABS: 5.0000 mg | ORAL_TABLET | Freq: Three times a day (TID) | ORAL | 5 refills | Status: DC

## 2020-02-16 MED ORDER — DULOXETINE HCL 30 MG PO CPEP: 30.0000 mg | ORAL_CAPSULE | Freq: Every day | ORAL | 5 refills | Status: DC

## 2020-02-16 NOTE — Patient Instructions (Signed)
Patient is a 54 yr old female with hx of C5 ASIA D due to osteomyelitis  Here for f/u.    1. Spasticity- gets worse for 1-2 years after injury.  Baclofen 5 mg 3x/day x 1 week then 10 mg 3x/day For spasms/tightness- spasticity- try for a couple of weeks- if still too tight, call me back!   2.  Duloxetine /Cymbalta 30 mg nightly x 1 week  Then 60 mg nightly- for nerve pain  1% of patients can have nausea with Duloxetine- call me if needs an anti-nausea medicine. Can also cause mild dry mouth/dry eyes and mild constipation. If can't sleep with it, take it in the morning.    3. Prevacid or Omeprazole is over the counter- 20 mg- can take daily while on so many meds.   4. Take you meds with food.   5. Don't think oxycodone will help nerve pain- and if tramadol more effective, than oxy, than having nerve pain.   6. Robaxin/Methocarbemol- is only for if "pulled a muscle in your back".  So only take as needed.   7. F/U in 1 months-

## 2020-02-16 NOTE — Progress Notes (Signed)
Subjective:    Patient ID: Anna Campos, female    DOB: 1965/11/01, 54 y.o.   MRN: [id number]  HPI  Patient is a 54 yr old female with hx of C5 ASIA D due to osteomyelitis  Here for f/u.   Feeling more tight- Robaxin not helping.  More tight in thighs, hands, and legs.   Gets shooting pain in neck-  Bought heating pad, that sometimes help  Went without tramadol for awhile. Back on tramadol-  2 tabs - used all of tramadol before using oxycodone.  Tramadol more effective than oxycodone.          Pain Inventory Average Pain 9 Pain Right Now 9 My pain is sharp and aching  In the last 24 hours, has pain interfered with the following? General activity 9 Relation with others 2 Enjoyment of life 2 What TIME of day is your pain at its worst? morning, night  Sleep (in general) Poor  Pain is worse with: some activites Pain improves with: heat/ice and medication Relief from Meds: 9  Mobility walk with assistance use a walker  Function disabled: date disabled .  Neuro/Psych bowel control problems trouble walking  Prior Studies transitional  Physicians involved in your care transitional   Family History  Problem Relation Age of Onset  . Breast cancer Mother    Social History   Socioeconomic History  . Marital status: Married    Spouse name: Not on file  . Number of children: Not on file  . Years of education: Not on file  . Highest education level: Not on file  Occupational History  . Not on file  Tobacco Use  . Smoking status: Former Research scientist (life sciences)  . Smokeless tobacco: Never Used  Substance and Sexual Activity  . Alcohol use: Yes    Comment: OCCASIONAL  . Drug use: Not Currently  . Sexual activity: Not on file  Other Topics Concern  . Not on file  Social History Narrative  . Not on file   Social Determinants of Health   Financial Resource Strain:   . Difficulty of Paying Living Expenses:   Food Insecurity:   . Worried About Charity fundraiser  in the Last Year:   . Arboriculturist in the Last Year:   Transportation Needs:   . Film/video editor (Medical):   Marland Kitchen Lack of Transportation (Non-Medical):   Physical Activity:   . Days of Exercise per Week:   . Minutes of Exercise per Session:   Stress:   . Feeling of Stress :   Social Connections:   . Frequency of Communication with Friends and Family:   . Frequency of Social Gatherings with Friends and Family:   . Attends Religious Services:   . Active Member of Clubs or Organizations:   . Attends Archivist Meetings:   Marland Kitchen Marital Status:    Past Surgical History:  Procedure Laterality Date  . ANTERIOR CERVICAL DECOMPRESSION/DISCECTOMY FUSION 4 LEVELS N/A 01/04/2020   Procedure: CERVICAL THREE- FOUR, CERVICAL FOUR-FIVE, CERVICAL FIVE- SIX, CERVCAL SIX- SEVEN ANTERIOR CERVICAL DECOMPRESSION/DISCECTOMY FUSION;  Surgeon: Earnie Larsson, MD;  Location: [place];  Service: Neurosurgery;  Laterality: N/A;  CERVICAL THREE- FOUR, CERVICAL FOUR-FIVE, CERVICAL FIVE- SIX, CERVCAL SIX- SEVEN ANTERIOR CERVICAL DECOMPRESSION/DISCECTOMY FUSION  . CESAREAN SECTION    . ESOPHAGOGASTRODUODENOSCOPY (EGD) WITH PROPOFOL N/A 12/27/2019   Procedure: ESOPHAGOGASTRODUODENOSCOPY (EGD) WITH PROPOFOL;  Surgeon: Ronnette Juniper, MD;  Location: [place];  Service: Gastroenterology;  Laterality: N/A;  PLEASE ALERT  ANESTHESIA TO THE FACT THAT THE PATIENT HAS SUSTAINED A NECK INJURY AND HAS A QUADRIPARESIS  . HEMOSTASIS CLIP PLACEMENT  12/27/2019   Procedure: HEMOSTASIS CLIP PLACEMENT;  Surgeon: Kerin Salen, MD;  Location: [facility] ENDOSCOPY;  Service: Gastroenterology;;  . MULTIPLE EXTRACTIONS WITH ALVEOLOPLASTY N/A 12/30/2019   Procedure: Extraction of tooth #'s 2-8, 11-14, and 20-28 with alveoloplasty and bilateral mandibular lingual tori reductions.;  Surgeon: Charlynne Pander, DDS;  Location: [facility] OR;  Service: Oral Surgery;  Laterality: N/A;   Past Medical History:  Diagnosis Date  . PE (pulmonary  thromboembolism) (HCC) 12/20/2019  . Syncope 12/20/2019   BP 97/62   Pulse 96   Temp 97.7 F (36.5 C)   Ht 5\' 9"  (1.753 m)   Wt 125 lb (56.7 kg)   SpO2 94%   BMI 18.46 kg/m   Opioid Risk Score:   Fall Risk Score:  `1  Depression screen PHQ 2/9  Depression screen PHQ 2/9 02/16/2020  Decreased Interest 0  Down, Depressed, Hopeless 0  PHQ - 2 Score 0  Altered sleeping 1  Tired, decreased energy 1  Change in appetite 0  Feeling bad or failure about yourself  2  Trouble concentrating 0  Moving slowly or fidgety/restless 0  Suicidal thoughts 0  PHQ-9 Score 4    Review of Systems  Constitutional: Negative.   HENT: Negative.   Eyes: Negative.   Respiratory: Negative.   Gastrointestinal: Negative.   Endocrine: Negative.   Genitourinary: Negative.   Musculoskeletal: Positive for gait problem.  Skin: Negative.   Allergic/Immunologic: Negative.   Hematological: Negative.   Psychiatric/Behavioral: Negative.   All other systems reviewed and are negative.      Objective:   Physical Exam  Awake, alert, appropriate, accompanied by sister, NAD (+) hoffman's on R- brisk No clonus in wrists but MAS of 1+ to 2 MAS of 3 in LEs- clonus 3-4 beats of R ankle as well Really tight in LEs- knees, hips and ankles.         Assessment & Plan:   Patient is a 54 yr old female with hx of C5 ASIA D due to osteomyelitis  Here for f/u.    1. Spasticity- gets worse for 1-2 years after injury.  Baclofen 5 mg 3x/day x 1 week then 10 mg 3x/day For spasms/tightness- spasticity- try for a couple of weeks- if still too tight, call me back!   2.  Duloxetine /Cymbalta 30 mg nightly x 1 week  Then 60 mg nightly- for nerve pain  1% of patients can have nausea with Duloxetine- call me if needs an anti-nausea medicine. Can also cause mild dry mouth/dry eyes and mild constipation. If can't sleep with it, take it in the morning.    3. Prevacid or Omeprazole is over the counter- 20 mg- can  take daily while on so many meds.   4. Take you meds with food.   5. Don't think oxycodone will help nerve pain- and if tramadol more effective, than oxy, than having nerve pain.   6. Robaxin/Methocarbemol- is only for if "pulled a muscle in your back".  So only take as needed.   7. F/U in 1 months-   I spent a total of 35 minutes on appointment- as per above.

## 2020-02-25 ENCOUNTER — Other Ambulatory Visit: Payer: Self-pay

## 2020-02-25 MED ORDER — FLUDROCORTISONE ACETATE 0.1 MG PO TABS: 0.2000 mg | ORAL_TABLET | Freq: Every day | ORAL | 1 refills | Status: DC

## 2020-02-25 MED ORDER — BACLOFEN 10 MG PO TABS: 15.0000 mg | ORAL_TABLET | Freq: Three times a day (TID) | ORAL | 5 refills | Status: DC

## 2020-02-25 NOTE — Telephone Encounter (Signed)
Yes, until I see her next- on Florinef.  Also, Increased baclofen to 15 mg 3x/day x 1 week then 20 mg TID

## 2020-02-25 NOTE — Telephone Encounter (Signed)
Patient called asking if she needs to continue Fludrocortisone, if so needs a refill. Also she states she is taking Baclofen one TID and its not helping.

## 2020-02-25 NOTE — Telephone Encounter (Signed)
Notified Mrs Rentz.

## 2020-02-28 ENCOUNTER — Telehealth: Payer: Self-pay

## 2020-02-28 NOTE — Telephone Encounter (Signed)
Tomma Lightning, PT/[facility] called requesting VO for HHPT 1wk1, missed a visit. Orders approved and given.

## 2020-03-01 DIAGNOSIS — S14153S Other incomplete lesion at C3 level of cervical spinal cord, sequela: Secondary | ICD-10-CM

## 2020-03-01 DIAGNOSIS — E440 Moderate protein-calorie malnutrition: Secondary | ICD-10-CM

## 2020-03-01 DIAGNOSIS — Z681 Body mass index (BMI) 19 or less, adult: Secondary | ICD-10-CM

## 2020-03-01 DIAGNOSIS — Z4789 Encounter for other orthopedic aftercare: Secondary | ICD-10-CM | POA: Diagnosis not present

## 2020-03-01 DIAGNOSIS — I2699 Other pulmonary embolism without acute cor pulmonale: Secondary | ICD-10-CM

## 2020-03-01 DIAGNOSIS — Z9181 History of falling: Secondary | ICD-10-CM

## 2020-03-01 DIAGNOSIS — G8254 Quadriplegia, C5-C7 incomplete: Secondary | ICD-10-CM | POA: Diagnosis not present

## 2020-03-01 DIAGNOSIS — F1210 Cannabis abuse, uncomplicated: Secondary | ICD-10-CM

## 2020-03-01 DIAGNOSIS — R55 Syncope and collapse: Secondary | ICD-10-CM

## 2020-03-01 DIAGNOSIS — N319 Neuromuscular dysfunction of bladder, unspecified: Secondary | ICD-10-CM

## 2020-03-01 DIAGNOSIS — K592 Neurogenic bowel, not elsewhere classified: Secondary | ICD-10-CM

## 2020-03-03 ENCOUNTER — Telehealth: Payer: Self-pay | Admitting: *Deleted

## 2020-03-03 DIAGNOSIS — G8254 Quadriplegia, C5-C7 incomplete: Secondary | ICD-10-CM

## 2020-03-03 NOTE — Telephone Encounter (Signed)
Lennette Bihari, PT, [facility] left a message stating patient is ready for O/P therapy.

## 2020-03-06 ENCOUNTER — Ambulatory Visit: Payer: Commercial Managed Care - PPO | Admitting: Physical Therapy

## 2020-03-06 ENCOUNTER — Telehealth: Payer: Self-pay

## 2020-03-06 ENCOUNTER — Ambulatory Visit: Payer: Commercial Managed Care - PPO

## 2020-03-06 NOTE — Telephone Encounter (Signed)
Patient called stating pharmacy is waiting on request for Tramadol. According to chart a prescription for Tramadol was sent to [facility] on 02/11/2020 with one refill. I called patient to let her know, left message.  She also wanted to speak to Dr. Berline Chough about disability.

## 2020-03-06 NOTE — Telephone Encounter (Signed)
How do I get a letter typed for pt- I have spent the last 15 minutes trying to use communications, and it won't let me type a new letter- I need it to say, basically due to her severe neurological injury resulting in an incomplete quadriplegia, we are unable to guess-timate when she would be able to return to work. And I can sign it

## 2020-03-07 ENCOUNTER — Telehealth: Payer: Self-pay | Admitting: *Deleted

## 2020-03-07 MED ORDER — DARIFENACIN HYDROBROMIDE ER 7.5 MG PO TB24: 7.5000 mg | ORAL_TABLET | Freq: Every day | ORAL | 11 refills | Status: DC

## 2020-03-07 NOTE — Telephone Encounter (Signed)
Will refill Enblex for pt. 11 RFs

## 2020-03-07 NOTE — Telephone Encounter (Signed)
Anna Campos called and is requesting a refill on her Darifenicin (Enablex). She is out of this medication,

## 2020-03-07 NOTE — Telephone Encounter (Signed)
Letter written, on your desk.

## 2020-03-07 NOTE — Telephone Encounter (Signed)
Thanks- I will sign it when there tomorrow- I really appreciate you helping me out :)  ML

## 2020-03-10 ENCOUNTER — Ambulatory Visit: Payer: Commercial Managed Care - PPO | Admitting: Physical Therapy

## 2020-03-21 ENCOUNTER — Other Ambulatory Visit: Payer: Self-pay | Admitting: Physical Medicine and Rehabilitation

## 2020-03-22 ENCOUNTER — Ambulatory Visit: Payer: Commercial Managed Care - PPO | Admitting: Physical Medicine and Rehabilitation

## 2020-04-24 ENCOUNTER — Other Ambulatory Visit: Payer: Self-pay | Admitting: Physical Medicine and Rehabilitation

## 2020-04-26 ENCOUNTER — Other Ambulatory Visit: Payer: Self-pay | Admitting: Physical Medicine and Rehabilitation

## 2020-05-19 ENCOUNTER — Telehealth: Payer: Self-pay | Admitting: *Deleted

## 2020-05-19 NOTE — Telephone Encounter (Signed)
Received a call from Anna Campos stating that the Eliquis is going to cost $500 and she cannot afford and was asking for alternative. When I reviewed her chart and meds, this was ordered in April at discharge from inpt rehab and she has not been seen in office since the May visit and she does not have a follow up appointment. I have called her back and advised her on name identified VM that she should contact her PCP to discuss as I am not sure why she is just now asking for the medication unless someone else has been prescribing.

## 2020-05-23 ENCOUNTER — Other Ambulatory Visit: Payer: Self-pay | Admitting: Physical Medicine and Rehabilitation

## 2020-05-23 NOTE — Telephone Encounter (Signed)
Patient called back about Eliquis and another medication that she didn't mention the name of. I called patient back no answer, left message.

## 2020-05-29 ENCOUNTER — Encounter: Payer: Self-pay | Attending: Physical Medicine and Rehabilitation | Admitting: Physical Medicine and Rehabilitation

## 2020-05-29 ENCOUNTER — Encounter: Payer: Self-pay | Admitting: Physical Medicine and Rehabilitation

## 2020-05-29 ENCOUNTER — Other Ambulatory Visit: Payer: Self-pay

## 2020-05-29 VITALS — BP 103/71 | HR 74 | Temp 98.7°F | Ht 69.0 in | Wt 122.0 lb

## 2020-05-29 DIAGNOSIS — M792 Neuralgia and neuritis, unspecified: Secondary | ICD-10-CM | POA: Insufficient documentation

## 2020-05-29 DIAGNOSIS — R269 Unspecified abnormalities of gait and mobility: Secondary | ICD-10-CM | POA: Insufficient documentation

## 2020-05-29 DIAGNOSIS — G8254 Quadriplegia, C5-C7 incomplete: Secondary | ICD-10-CM | POA: Insufficient documentation

## 2020-05-29 DIAGNOSIS — N319 Neuromuscular dysfunction of bladder, unspecified: Secondary | ICD-10-CM | POA: Insufficient documentation

## 2020-05-29 DIAGNOSIS — R252 Cramp and spasm: Secondary | ICD-10-CM | POA: Insufficient documentation

## 2020-05-29 DIAGNOSIS — K592 Neurogenic bowel, not elsewhere classified: Secondary | ICD-10-CM | POA: Insufficient documentation

## 2020-05-29 MED ORDER — TRAMADOL HCL 50 MG PO TABS: 100.0000 mg | ORAL_TABLET | Freq: Three times a day (TID) | ORAL | 5 refills | Status: DC

## 2020-05-29 MED ORDER — BACLOFEN 20 MG PO TABS: 20.0000 mg | ORAL_TABLET | Freq: Three times a day (TID) | ORAL | 11 refills | Status: DC

## 2020-05-29 NOTE — Patient Instructions (Addendum)
1. So finish out the current Rx for Apixiban/Eliquis- and then don't refill.  Went over research with pt- NO RESEARCH on PE's and SCI patients and how long to treat- general population it's 3 months.  So if gets abruptly Short of breath- go to ER immediately, unless you know the cause.     2.  Elevate legs to level of heart-  Can also use lower leg stockings-compression stockings.   3.Change baclofen to 20 mg tablet- can take 3x/day for spasms- gave 1 year of refills.    4.  Suggest using suppository daily vs nightly- the best time to do it is after a meal- do it daily at the same time of day- probably won't need dig stim at this time.     5. [facility]? See if we can get her in.   6. Florinef/ Fludocortisone - try to decrease to 0.1mg  daily- if develops symptoms, go back to 2 pills/day-  Symptoms are dizzy, lightheaded, feel like can't get out of bed, woozy, etc.   7. Duloxetine- take 30 mg daily x 4-7 days, then take 60 mg daily- take for 1 month- IF helps, we can continue for nerve pain- if not, can stop.   8. F/U in 3 months. Call me if you need me!  9. Note- Amy mentioned she's having memory issues still since was in hospital.

## 2020-05-29 NOTE — Telephone Encounter (Signed)
Patient is scheduled to come in 05/29/2020

## 2020-05-29 NOTE — Progress Notes (Signed)
Subjective:    Patient ID: Anna Campos, female    DOB: 10-15-1966, 54 y.o.   MRN: [id number]  HPI  Patient is a 54 yr old female with hx of C5 ASIA D due to osteomyelitis  Here for f/u.   Accompanied by Anna Campos- sister in law-   The cheapest they could get Eliquis was $500- couldn't afford to get anymore.   Took for 4 months total- had to borrow money to get the 4th month    Needs refill on tramadol 100 mg 3x/day- hasn't gotten lately- [facility] at [facility] time.    Having swelling in legs- not elevating legs a lot.     Didn't want to tell her that she fell this AM- L leg gave out- used rollator to get up to knees/legs.   Also fell a few times when got home from hospital-   Didn't hit head but hit R hip- is sore.   No control of bowels- when they come, they come- can hold a little bit to go to bathroom, but not long.  Has to run to bathroom when has urge.  Has benefiber- and Activa- not taking any bowel meds anymore. It works, and doesn't need PO meds anymore.     Bladder- no problems controlling bladder-   Spasms- doing pretty well right now- occ has spasms, but not regularly- Baclofen 20    Can have orgasms- on her own.    BP still runs on low side. Not dizzy/lightheaded usually- just this AM.  When wakes up, legs hurt, but not usually woozy.    Was on Duloxetine in hospital- has more pain than when in hospital-  Not taking it currently.   Pain Inventory Average Pain 9 Pain Right Now 9 My pain is sharp, dull, tingling and aching  In the last 24 hours, has pain interfered with the following? General activity 9 Relation with others 8 Enjoyment of life 8 What TIME of day is your pain at its worst? morning night Sleep (in general) Poor  Pain is worse with: bending and sitting Pain improves with: rest, heat/ice and medication Relief from Meds: 6  Mobility use a walker ability to climb steps?  yes do you drive?   no  Function disabled: date disabled . I need assistance with the following:  dressing, bathing, meal prep, household duties and shopping  Neuro/Psych weakness trouble walking  Prior Studies Any changes since last visit?  no CT/MRI  Physicians involved in your care Neurosurgeon Dr. Jordan Likes   Family History  Problem Relation Age of Onset  . Breast cancer Mother    Social History   Socioeconomic History  . Marital status: Married    Spouse name: Not on file  . Number of children: Not on file  . Years of education: Not on file  . Highest education level: Not on file  Occupational History  . Not on file  Tobacco Use  . Smoking status: Former Games developer  . Smokeless tobacco: Never Used  Vaping Use  . Vaping Use: Never used  Substance and Sexual Activity  . Alcohol use: Yes    Comment: OCCASIONAL  . Drug use: Not Currently  . Sexual activity: Not on file  Other Topics Concern  . Not on file  Social History Narrative  . Not on file   Social Determinants of Health   Financial Resource Strain:   . Difficulty of Paying Living Expenses:   Food Insecurity:   .  Worried About Programme researcher, broadcasting/film/video in the Last Year:   . Barista in the Last Year:   Transportation Needs:   . Freight forwarder (Medical):   Marland Kitchen Lack of Transportation (Non-Medical):   Physical Activity:   . Days of Exercise per Week:   . Minutes of Exercise per Session:   Stress:   . Feeling of Stress :   Social Connections:   . Frequency of Communication with Friends and Family:   . Frequency of Social Gatherings with Friends and Family:   . Attends Religious Services:   . Active Member of Clubs or Organizations:   . Attends Banker Meetings:   Marland Kitchen Marital Status:    Past Surgical History:  Procedure Laterality Date  . ANTERIOR CERVICAL DECOMPRESSION/DISCECTOMY FUSION 4 LEVELS N/A 01/04/2020   Procedure: CERVICAL THREE- FOUR, CERVICAL FOUR-FIVE, CERVICAL FIVE- SIX, CERVCAL SIX- SEVEN  ANTERIOR CERVICAL DECOMPRESSION/DISCECTOMY FUSION;  Surgeon: Julio Sicks, MD;  Location: [facility] OR;  Service: Neurosurgery;  Laterality: N/A;  CERVICAL THREE- FOUR, CERVICAL FOUR-FIVE, CERVICAL FIVE- SIX, CERVCAL SIX- SEVEN ANTERIOR CERVICAL DECOMPRESSION/DISCECTOMY FUSION  . CESAREAN SECTION    . ESOPHAGOGASTRODUODENOSCOPY (EGD) WITH PROPOFOL N/A 12/27/2019   Procedure: ESOPHAGOGASTRODUODENOSCOPY (EGD) WITH PROPOFOL;  Surgeon: Kerin Salen, MD;  Location: [facility] ENDOSCOPY;  Service: Gastroenterology;  Laterality: N/A;  PLEASE ALERT ANESTHESIA TO THE FACT THAT THE PATIENT HAS SUSTAINED A NECK INJURY AND HAS A QUADRIPARESIS  . HEMOSTASIS CLIP PLACEMENT  12/27/2019   Procedure: HEMOSTASIS CLIP PLACEMENT;  Surgeon: Kerin Salen, MD;  Location: [facility] ENDOSCOPY;  Service: Gastroenterology;;  . MULTIPLE EXTRACTIONS WITH ALVEOLOPLASTY N/A 12/30/2019   Procedure: Extraction of tooth #'s 2-8, 11-14, and 20-28 with alveoloplasty and bilateral mandibular lingual tori reductions.;  Surgeon: Charlynne Pander, DDS;  Location: [facility] OR;  Service: Oral Surgery;  Laterality: N/A;   Past Medical History:  Diagnosis Date  . PE (pulmonary thromboembolism) (HCC) 12/20/2019  . Syncope 12/20/2019   BP 103/71   Pulse 74   Temp 98.7 F (37.1 C)   Ht 5\' 9"  (1.753 m)   Wt 122 lb (55.3 kg)   SpO2 98%   BMI 18.02 kg/m   Opioid Risk Score:   Fall Risk Score:  `1  Depression screen PHQ 2/9  Depression screen PHQ 2/9 02/16/2020  Decreased Interest 0  Down, Depressed, Hopeless 0  PHQ - 2 Score 0  Altered sleeping 1  Tired, decreased energy 1  Change in appetite 0  Feeling bad or failure about yourself  2  Trouble concentrating 0  Moving slowly or fidgety/restless 0  Suicidal thoughts 0  PHQ-9 Score 4      Review of Systems  Musculoskeletal: Positive for gait problem.  Neurological: Positive for weakness.  All other systems reviewed and are negative.      Objective:   Physical Exam  Awake,alert, accompanied by  sister in law- Anna Campos, NAD Has rollator      Assessment & Plan:   Patient is a 54 yr old female with hx of C5 ASIA D due to osteomyelitis  Here for f/u.   1. So finish out the current Rx for Apixiban/Eliquis- and then don't refill.  Went over research with pt- NO RESEARCH on PE's and SCI patients and how long to treat- general population it's 3 months.  So if gets abruptly Short of breath- go to ER immediately, unless you know the cause.     2.  Elevate legs to level of heart-  Can  also use lower leg stockings-compression stockings.   3.Change baclofen to 20 mg tablet- can take 3x/day for spasms- gave 1 year of refills.    4.  Suggest using suppository daily vs nightly- the best time to do it is after a meal- do it daily at the same time of day- probably won't need dig stim at this time.     5. [facility]? See if we can get her in.   6. Florinef/ Fludocortisone - try to decrease to 0.1mg  daily- if develops symptoms, go back to 2 pills/day-  Symptoms are dizzy, lightheaded, feel like can't get out of bed, woozy, etc.   7. Duloxetine- take 30 mg daily x 4-7 days, then take 60 mg daily- take for 1 month- IF helps, we can continue for nerve pain- if not, can stop.   8. F/U in 3 months. Call me if you need me!  9. Note- Anna Campos mentioned she's having memory issues still since was in hospital.    I spent a total of 35 minutes on visit- as detailed above.

## 2020-06-21 ENCOUNTER — Other Ambulatory Visit: Payer: Self-pay | Admitting: Physical Medicine and Rehabilitation

## 2020-07-07 ENCOUNTER — Telehealth: Payer: Self-pay | Admitting: *Deleted

## 2020-07-07 NOTE — Telephone Encounter (Signed)
Anna Campos called requesting a call back from Dr Berline Chough. I called her and it was a billing question and medication question. She was asking about the oxycodone but that was last rx at discharge from hospital in April and Dr Berline Chough has prescribed Tramadol in its place and she has refills on that medication.  I have transferred her billing question to April Marshall to assist.

## 2020-07-22 ENCOUNTER — Other Ambulatory Visit: Payer: Self-pay | Admitting: Physical Medicine and Rehabilitation

## 2020-07-27 ENCOUNTER — Telehealth: Payer: Self-pay | Admitting: *Deleted

## 2020-07-28 NOTE — Telephone Encounter (Signed)
Loratadine- wanted increased dose Claritin- generic-  Suggest she get it [facility] or [facility].   Has a test- for Disability as of 10/30- and will send results to me- I am OK with this.

## 2020-08-15 ENCOUNTER — Other Ambulatory Visit: Payer: Self-pay | Admitting: Physical Medicine and Rehabilitation

## 2020-08-16 ENCOUNTER — Other Ambulatory Visit: Payer: Self-pay

## 2020-08-16 MED ORDER — TRAMADOL HCL 50 MG PO TABS: 100.0000 mg | ORAL_TABLET | Freq: Three times a day (TID) | ORAL | 5 refills | Status: DC

## 2020-08-16 MED ORDER — DULOXETINE HCL 60 MG PO CPEP: 60.0000 mg | ORAL_CAPSULE | Freq: Every day | ORAL | 11 refills | Status: DC

## 2020-08-16 MED ORDER — FLUDROCORTISONE ACETATE 0.1 MG PO TABS: 0.2000 mg | ORAL_TABLET | Freq: Every day | ORAL | 11 refills | Status: DC

## 2020-08-16 NOTE — Telephone Encounter (Signed)
Patient called requesting refills on all 6 of medication with additional refills. Baclofen, Duloxetine, Darifenacin, Fludrocortisone, Loratadine and Tramadol.

## 2020-08-16 NOTE — Telephone Encounter (Signed)
Refilling only the ones that are due to be refilled- Enblex and Baclofen had 11 refills on them, so still have at least 5 RFs.  Loratadine is an over the counter medicine, and cannot write for it.  Refilled the other meds. Please call pt and let her know.

## 2020-08-16 NOTE — Telephone Encounter (Signed)
I spoke with her

## 2020-08-18 ENCOUNTER — Encounter: Payer: Self-pay | Admitting: Internal Medicine

## 2020-08-18 ENCOUNTER — Other Ambulatory Visit: Payer: Self-pay

## 2020-08-18 ENCOUNTER — Ambulatory Visit: Payer: Self-pay | Attending: Internal Medicine | Admitting: Internal Medicine

## 2020-08-18 VITALS — BP 118/78 | HR 83 | Resp 16 | Ht 69.0 in | Wt 118.6 lb

## 2020-08-18 DIAGNOSIS — G903 Multi-system degeneration of the autonomic nervous system: Secondary | ICD-10-CM

## 2020-08-18 DIAGNOSIS — N319 Neuromuscular dysfunction of bladder, unspecified: Secondary | ICD-10-CM

## 2020-08-18 DIAGNOSIS — D649 Anemia, unspecified: Secondary | ICD-10-CM

## 2020-08-18 DIAGNOSIS — G8254 Quadriplegia, C5-C7 incomplete: Secondary | ICD-10-CM

## 2020-08-18 DIAGNOSIS — Z23 Encounter for immunization: Secondary | ICD-10-CM

## 2020-08-18 DIAGNOSIS — M792 Neuralgia and neuritis, unspecified: Secondary | ICD-10-CM

## 2020-08-18 DIAGNOSIS — Z7689 Persons encountering health services in other specified circumstances: Secondary | ICD-10-CM

## 2020-08-18 DIAGNOSIS — K279 Peptic ulcer, site unspecified, unspecified as acute or chronic, without hemorrhage or perforation: Secondary | ICD-10-CM

## 2020-08-18 DIAGNOSIS — K592 Neurogenic bowel, not elsewhere classified: Secondary | ICD-10-CM

## 2020-08-18 NOTE — Progress Notes (Signed)
Pt states her pain is coming from her neck to back   Pt states the Tramadol does help sometimes

## 2020-08-18 NOTE — Patient Instructions (Addendum)
Please sign a release when you go to check out for me to get records from [employer].  Influenza Virus Vaccine injection (Fluarix) What is this medicine? INFLUENZA VIRUS VACCINE (in floo EN zuh VAHY ruhs vak SEEN) helps to reduce the risk of getting influenza also known as the flu. This medicine may be used for other purposes; ask your health care provider or pharmacist if you have questions. COMMON BRAND NAME(S): Fluarix, Fluzone What should I tell my health care provider before I take this medicine? They need to know if you have any of these conditions:  bleeding disorder like hemophilia  fever or infection  Guillain-Barre syndrome or other neurological problems  immune system problems  infection with the human immunodeficiency virus (HIV) or AIDS  low blood platelet counts  multiple sclerosis  an unusual or allergic reaction to influenza virus vaccine, eggs, chicken proteins, latex, gentamicin, other medicines, foods, dyes or preservatives  pregnant or trying to get pregnant  breast-feeding How should I use this medicine? This vaccine is for injection into a muscle. It is given by a health care professional. A copy of Vaccine Information Statements will be given before each vaccination. Read this sheet carefully each time. The sheet may change frequently. Talk to your pediatrician regarding the use of this medicine in children. Special care may be needed. Overdosage: If you think you have taken too much of this medicine contact a poison control center or emergency room at once. NOTE: This medicine is only for you. Do not share this medicine with others. What if I miss a dose? This does not apply. What may interact with this medicine?  chemotherapy or radiation therapy  medicines that lower your immune system like etanercept, anakinra, infliximab, and adalimumab  medicines that treat or prevent blood clots like warfarin  phenytoin  steroid medicines like  prednisone or cortisone  theophylline  vaccines This list may not describe all possible interactions. Give your health care provider a list of all the medicines, herbs, non-prescription drugs, or dietary supplements you use. Also tell them if you smoke, drink alcohol, or use illegal drugs. Some items may interact with your medicine. What should I watch for while using this medicine? Report any side effects that do not go away within 3 days to your doctor or health care professional. Call your health care provider if any unusual symptoms occur within 6 weeks of receiving this vaccine. You may still catch the flu, but the illness is not usually as bad. You cannot get the flu from the vaccine. The vaccine will not protect against colds or other illnesses that may cause fever. The vaccine is needed every year. What side effects may I notice from receiving this medicine? Side effects that you should report to your doctor or health care professional as soon as possible:  allergic reactions like skin rash, itching or hives, swelling of the face, lips, or tongue Side effects that usually do not require medical attention (report to your doctor or health care professional if they continue or are bothersome):  fever  headache  muscle aches and pains  pain, tenderness, redness, or swelling at site where injected  weak or tired This list may not describe all possible side effects. Call your doctor for medical advice about side effects. You may report side effects to FDA at 1-800-FDA-1088. Where should I keep my medicine? This vaccine is only given in a clinic, pharmacy, doctor's office, or other health care setting and will not be stored  at home. NOTE: This sheet is a summary. It may not cover all possible information. If you have questions about this medicine, talk to your doctor, pharmacist, or health care provider.  2020 Elsevier/Gold Standard (2008-05-04 09:30:40)

## 2020-08-18 NOTE — Progress Notes (Signed)
Patient ID: Anna Campos, female    DOB: November 07, 1965  MRN: [id number]  CC: New Patient (Initial Visit)   Subjective: Anna Campos is a 54 y.o. female who presents for new patient visit new patient visit.  Her husband Mellody Dance is with her. Her concerns today include:  Patient with history of incomplete quadriplegia at C5-6 level, neurogenic bowel, neurogenic bladder, nerve pain, anemia, hypotension, gastric ulcers, PE in the setting of acute illness.  Previous PCP was Dr. Elie Goody at [facility].  She saw him sometime last year.  She has decided to change physician due to loss of insurance. Patient had prolonged hospitalization in the early part of this year from February to April after she fell at home due to hypotension hitting her head.  She sustained injury to the cervical spine.  She also developed strep bacteremia while in the hospital.  She underwent decompression surgery of the cervical spine and then spent several weeks in inpatient rehab.  She is now seen by Dr. Berline Chough from PMR.  Incomplete quadriplegia:  She has been left with right-sided weakness.  Patient ambulates with a Rollator walker.  She feels off balance at times but has not had any falls.  She endorses chronic pain in her back neck and right arm.  She also reports stiffness for which she is on baclofen.  She is on Cymbalta and tramadol for pain through PMR.  She endorses having a shower chair and grab bar in the shower.  She has a disability determination tomorrow.  She lives with her husband.  She denies any major issues with anxiety or depression.  She has good support from her husband and also from her church.  She is thankful that she is able to walk.  History of PE: This occurred during her hospitalization.  She completed 3 months of Eliquis already.  The Eliquis has now been discontinued.  She denies any shortness of breath.  She is on Florinef for low blood pressure.  Dr. Lindell Noe try to wean her  off of it but she was waking up in the mornings with dizziness and low blood pressure so it was restarted.  HM: She reports having had a colonoscopy at the age of 89 through her previous PCP at [facility].  She will sign a release for [place] to get her records from [facility].  She has completed the COVID-19 vaccine series.   Past medical, social, family history reviewed  Patient Active Problem List   Diagnosis Date Noted  . Spasticity 02/16/2020  . Nerve pain 02/16/2020  . Impaired gait 02/16/2020  . Pressure injury of skin 01/15/2020  . Transaminitis   . Hyponatremia   . Anemia   . Neurogenic bowel 01/11/2020  . Neurogenic bladder 01/11/2020  . Malnutrition of moderate degree 01/01/2020  . Poor dentition   . Spinal stenosis of cervical region   . Incomplete quadriplegia at C5-6 level (HCC)   . Hypotension   . Bacteremia   . Syncope 12/19/2019     Current Outpatient Medications on File Prior to Visit  Medication Sig Dispense Refill  . acetaminophen (TYLENOL) 325 MG tablet Take 2 tablets (650 mg total) by mouth every 4 (four) hours as needed for mild pain (temp > 101.5).    . baclofen (LIORESAL) 20 MG tablet Take 1 tablet (20 mg total) by mouth 3 (three) times daily. 90 each 11  . darifenacin (ENABLEX) 7.5 MG 24 hr tablet Take 1 tablet (7.5  mg total) by mouth daily. 30 tablet 11  . DULoxetine (CYMBALTA) 60 MG capsule Take 1 capsule (60 mg total) by mouth daily. 30 capsule 11  . fludrocortisone (FLORINEF) 0.1 MG tablet Take 2 tablets (0.2 mg total) by mouth daily. 60 tablet 11  . loratadine (CLARITIN) 10 MG tablet TAKE 1 TABLET BY MOUTH EVERY DAY 30 tablet 0  . Multiple Vitamin (MULTIVITAMIN WITH MINERALS) TABS tablet Take 1 tablet by mouth daily.    . traMADol (ULTRAM) 50 MG tablet Take 2 tablets (100 mg total) by mouth every 8 (eight) hours. 180 tablet 5  . bisacodyl (DULCOLAX) 10 MG suppository Place 1 suppository (10 mg total) rectally daily as needed for  moderate constipation. (Patient not taking: Reported on 08/18/2020) 12 suppository 0  . methocarbamol (ROBAXIN) 500 MG tablet Take 1 tablet (500 mg total) by mouth every 8 (eight) hours as needed for muscle spasms. 60 tablet 0  . pantoprazole (PROTONIX) 40 MG tablet Take 1 tablet (40 mg total) by mouth 2 (two) times daily before a meal for 2 days. 60 tablet 0  . senna-docusate (SENOKOT-S) 8.6-50 MG tablet Take 2 tablets by mouth 2 (two) times daily. (Patient not taking: Reported on 08/18/2020)     No current facility-administered medications on file prior to visit.    Allergies  Allergen Reactions  . Ampicillin Diarrhea  . Chlorhexidine     Social History   Socioeconomic History  . Marital status: Married    Spouse name: Not on file  . Number of children: Not on file  . Years of education: Not on file  . Highest education level: Not on file  Occupational History  . Not on file  Tobacco Use  . Smoking status: Former Games developermoker  . Smokeless tobacco: Never Used  Vaping Use  . Vaping Use: Never used  Substance and Sexual Activity  . Alcohol use: Yes    Comment: OCCASIONAL  . Drug use: Not Currently  . Sexual activity: Not on file  Other Topics Concern  . Not on file  Social History Narrative  . Not on file   Social Determinants of Health   Financial Resource Strain:   . Difficulty of Paying Living Expenses: Not on file  Food Insecurity:   . Worried About Programme researcher, broadcasting/film/videounning Out of Food in the Last Year: Not on file  . Ran Out of Food in the Last Year: Not on file  Transportation Needs:   . Lack of Transportation (Medical): Not on file  . Lack of Transportation (Non-Medical): Not on file  Physical Activity:   . Days of Exercise per Week: Not on file  . Minutes of Exercise per Session: Not on file  Stress:   . Feeling of Stress : Not on file  Social Connections:   . Frequency of Communication with Friends and Family: Not on file  . Frequency of Social Gatherings with Friends and  Family: Not on file  . Attends Religious Services: Not on file  . Active Member of Clubs or Organizations: Not on file  . Attends BankerClub or Organization Meetings: Not on file  . Marital Status: Not on file  Intimate Partner Violence:   . Fear of Current or Ex-Partner: Not on file  . Emotionally Abused: Not on file  . Physically Abused: Not on file  . Sexually Abused: Not on file    Family History  Problem Relation Age of Onset  . Breast cancer Mother     Past Surgical History:  Procedure  Laterality Date  . ANTERIOR CERVICAL DECOMPRESSION/DISCECTOMY FUSION 4 LEVELS N/A 01/04/2020   Procedure: CERVICAL THREE- FOUR, CERVICAL FOUR-FIVE, CERVICAL FIVE- SIX, CERVCAL SIX- SEVEN ANTERIOR CERVICAL DECOMPRESSION/DISCECTOMY FUSION;  Surgeon: Julio Sicks, MD;  Location: [facility] OR;  Service: Neurosurgery;  Laterality: N/A;  CERVICAL THREE- FOUR, CERVICAL FOUR-FIVE, CERVICAL FIVE- SIX, CERVCAL SIX- SEVEN ANTERIOR CERVICAL DECOMPRESSION/DISCECTOMY FUSION  . CESAREAN SECTION    . ESOPHAGOGASTRODUODENOSCOPY (EGD) WITH PROPOFOL N/A 12/27/2019   Procedure: ESOPHAGOGASTRODUODENOSCOPY (EGD) WITH PROPOFOL;  Surgeon: Kerin Salen, MD;  Location: [facility] ENDOSCOPY;  Service: Gastroenterology;  Laterality: N/A;  PLEASE ALERT ANESTHESIA TO THE FACT THAT THE PATIENT HAS SUSTAINED A NECK INJURY AND HAS A QUADRIPARESIS  . HEMOSTASIS CLIP PLACEMENT  12/27/2019   Procedure: HEMOSTASIS CLIP PLACEMENT;  Surgeon: Kerin Salen, MD;  Location: [facility] ENDOSCOPY;  Service: Gastroenterology;;  . MULTIPLE EXTRACTIONS WITH ALVEOLOPLASTY N/A 12/30/2019   Procedure: Extraction of tooth #'s 2-8, 11-14, and 20-28 with alveoloplasty and bilateral mandibular lingual tori reductions.;  Surgeon: Charlynne Pander, DDS;  Location: [facility] OR;  Service: Oral Surgery;  Laterality: N/A;    ROS: Review of Systems Negative except as stated above  PHYSICAL EXAM: BP 118/78   Pulse 83   Resp 16   Ht 5\' 9"  (1.753 m)   Wt 118 lb 9.6 oz (53.8 kg)   SpO2 100%    BMI 17.51 kg/m   Wt Readings from Last 3 Encounters:  08/18/20 118 lb 9.6 oz (53.8 kg)  05/29/20 122 lb (55.3 kg)  02/16/20 125 lb (56.7 kg)    Physical Exam  General appearance - alert, well appearing, middle-aged African-American female and in no distress Mental status - normal mood, behavior, speech, dress, motor activity, and thought processes Eyes - pupils equal and reactive, extraocular eye movements intact Nose - normal and patent, no erythema, discharge or polyps Mouth - mucous membranes moist, pharynx normal without lesions Neck - supple, no significant adenopathy Chest - clear to auscultation, no wheezes, rales or rhonchi, symmetric air entry Heart - normal rate, regular rhythm, normal S1, S2, no murmurs, rubs, clicks or gallops Neurological -she has some wasting of muscles in the right hand and arm.  She has mild contractures of the third through the fifth fingers on the right hand.  Grip is diminished on the right compared to the left.  Power proximally and distally in the upper extremities 4+/5 on the left, 4/5 on the right.  Power in the lower extremities good. Extremities -trace edema in the feet.   CMP Latest Ref Rng & Units 02/01/2020 01/25/2020 01/19/2020  Glucose 70 - 99 mg/dL 90 95 89  BUN 6 - 20 mg/dL 16 20 19   Creatinine 0.44 - 1.00 mg/dL 01/21/2020 0.07  Sodium 135 - 145 mmol/L 139 140 136  Potassium 3.5 - 5.1 mmol/L 4.1 4.3 4.6  Chloride 98 - 111 mmol/L 102 102 100  CO2 22 - 32 mmol/L 29 28 27   Calcium 8.9 - 10.3 mg/dL 9.6 6.22 9.9  Total Protein 6.5 - 8.1 g/dL - - -  Total Bilirubin 0.3 - 1.2 mg/dL - - -  Alkaline Phos 38 - 126 U/L - - -  AST 15 - 41 U/L - - -  ALT 0 - 44 U/L - - -   Lipid Panel  No results found for: CHOL, TRIG, HDL, CHOLHDL, VLDL, LDLCALC, LDLDIRECT  CBC    Component Value Date/Time   WBC 5.9 02/01/2020 0501   RBC 3.46 (L) 02/01/2020 0501   HGB  10.3 (L) 02/01/2020 0501   HCT 33.0 (L) 02/01/2020 0501   PLT 310 02/01/2020 0501   MCV  95.4 02/01/2020 0501   MCH 29.8 02/01/2020 0501   MCHC 31.2 02/01/2020 0501   RDW 12.6 02/01/2020 0501   LYMPHSABS 2.0 02/01/2020 0501   MONOABS 0.6 02/01/2020 0501   EOSABS 0.3 02/01/2020 0501   BASOSABS 0.0 02/01/2020 0501    ASSESSMENT AND PLAN: 1. Establishing care with new doctor, encounter for Pt to sign release for [place] to get records from previous PCP  2. Neurogenic orthostatic hypotension (HCC) On Florinef - Comprehensive metabolic panel  3. PUD (peptic ulcer disease) On Protonix  4. Neurogenic bowel  5. Neurogenic bladder On Enablex  6. Incomplete quadriplegia at C5-6 level (HCC) Followed by PMR.  Has made good progress from 11/2019  7. Normocytic anemia  - CBC  8. Neuropathic pain On Cymbalta and Tramadol through PMR  9. Need for immunization against influenza - Flu Vaccine QUAD 36+ mos IM    Patient was given the opportunity to ask questions.  Patient verbalized understanding of the plan and was able to repeat key elements of the plan.   Orders Placed This Encounter  Procedures  . Flu Vaccine QUAD 36+ mos IM  . CBC  . Comprehensive metabolic panel     Requested Prescriptions    No prescriptions requested or ordered in this encounter    Return in about 3 months (around 11/18/2020) for sign release for me to get records from previous PCP at [facility].  Jonah Blue, MD, FACP

## 2020-08-19 LAB — COMPREHENSIVE METABOLIC PANEL
ALT: 13 IU/L (ref 0–32)
AST: 24 IU/L (ref 0–40)
Albumin/Globulin Ratio: 1.9 (ref 1.2–2.2)
Albumin: 4.4 g/dL (ref 3.8–4.9)
Alkaline Phosphatase: 114 IU/L (ref 44–121)
BUN/Creatinine Ratio: 15 (ref 9–23)
BUN: 12 mg/dL (ref 6–24)
Bilirubin Total: 0.4 mg/dL (ref 0.0–1.2)
CO2: 26 mmol/L (ref 20–29)
Calcium: 9.6 mg/dL (ref 8.7–10.2)
Chloride: 104 mmol/L (ref 96–106)
Creatinine, Ser: 0.78 mg/dL (ref 0.57–1.00)
GFR calc Af Amer: 101 mL/min/{1.73_m2} (ref >59)
GFR calc non Af Amer: 88 mL/min/{1.73_m2} (ref >59)
Globulin, Total: 2.3 g/dL (ref 1.5–4.5)
Glucose: 77 mg/dL (ref 65–99)
Potassium: 4.1 mmol/L (ref 3.5–5.2)
Sodium: 142 mmol/L (ref 134–144)
Total Protein: 6.7 g/dL (ref 6.0–8.5)

## 2020-08-19 LAB — CBC
Hematocrit: 36.7 % (ref 34.0–46.6)
Hemoglobin: 11.8 g/dL (ref 11.1–15.9)
MCH: 29.4 pg (ref 26.6–33.0)
MCHC: 32.2 g/dL (ref 31.5–35.7)
MCV: 91 fL (ref 79–97)
Platelets: 291 10*3/uL (ref 150–450)
RBC: 4.02 x10E6/uL (ref 3.77–5.28)
RDW: 11.7 % (ref 11.7–15.4)
WBC: 6.2 10*3/uL (ref 3.4–10.8)

## 2020-08-21 ENCOUNTER — Telehealth: Payer: Self-pay

## 2020-08-21 NOTE — Telephone Encounter (Signed)
Contacted pt to go over lab results pt is aware and doesn't have any questions or concerns 

## 2020-08-28 ENCOUNTER — Other Ambulatory Visit: Payer: Self-pay | Admitting: Internal Medicine

## 2020-08-28 MED ORDER — LORATADINE 10 MG PO TABS: 10.0000 mg | ORAL_TABLET | Freq: Every day | ORAL | 2 refills | Status: DC

## 2020-08-28 MED ORDER — DARIFENACIN HYDROBROMIDE ER 7.5 MG PO TB24: 7.5000 mg | ORAL_TABLET | Freq: Every day | ORAL | 2 refills | Status: DC

## 2020-08-28 NOTE — Telephone Encounter (Signed)
Requested medication (s) are due for refill today: Yes  Requested medication (s) are on the active medication list: Yes  Last refill:  3 months ago  Future visit scheduled: Yes  Notes to clinic:  Unable to refill per protocol, last refilled by another provider, patient switching pharmacy     Requested Prescriptions  Pending Prescriptions Disp Refills   darifenacin (ENABLEX) 7.5 MG 24 hr tablet 30 tablet 11    Sig: Take 1 tablet (7.5 mg total) by mouth daily.      Urology:  Bladder Agents Passed - 08/28/2020 10:50 AM      Passed - Valid encounter within last 12 months    Recent Outpatient Visits           1 week ago Establishing care with new doctor, encounter for   [facility] Marcine Matar, MD       Future Appointments             In 2 months Marcine Matar, MD [facility]              loratadine (CLARITIN) 10 MG tablet 30 tablet 0    Sig: Take 1 tablet (10 mg total) by mouth daily.      Ear, Nose, and Throat:  Antihistamines Passed - 08/28/2020 10:50 AM      Passed - Valid encounter within last 12 months    Recent Outpatient Visits           1 week ago Establishing care with new doctor, encounter for   [facility] Marcine Matar, MD       Future Appointments             In 2 months Laural Benes Binnie Rail, MD [facility]

## 2020-08-28 NOTE — Telephone Encounter (Signed)
Copied from CRM [id number]. Topic: Quick Communication - Rx Refill/Question >> Aug 28, 2020 10:39 AM Jaquita Rector A wrote: Medication: darifenacin (ENABLEX) 7.5 MG 24 hr tablet, loratadine (CLARITIN) 10 MG tablet   Has the patient contacted their pharmacy? Yes.   (Agent: If no, request that the patient contact the pharmacy for the refill.) (Agent: If yes, when and what did the pharmacy advise?)  Preferred Pharmacy (with phone number or street name): [facility] - [place], [place] - [address]  Phone:  [phone] Fax:  [phone]     Agent: Please be advised that RX refills may take up to 3 business days. We ask that you follow-up with your pharmacy.

## 2020-08-29 MED FILL — DARIFENACIN ER 7.5 MG TAB: 7.5 | 30 days supply | Qty: 30 | Fill #0

## 2020-08-29 MED FILL — LORATADINE 10 MG TABLET: 10 | 30 days supply | Qty: 30 | Fill #0

## 2020-09-01 ENCOUNTER — Encounter: Payer: Self-pay | Attending: Physical Medicine and Rehabilitation | Admitting: Physical Medicine and Rehabilitation

## 2020-09-01 ENCOUNTER — Other Ambulatory Visit: Payer: Self-pay

## 2020-09-01 ENCOUNTER — Other Ambulatory Visit: Payer: Self-pay | Admitting: Physical Medicine and Rehabilitation

## 2020-09-01 ENCOUNTER — Encounter: Payer: Self-pay | Admitting: Physical Medicine and Rehabilitation

## 2020-09-01 VITALS — BP 108/72 | HR 85 | Temp 98.3°F | Ht 69.0 in | Wt 113.8 lb

## 2020-09-01 DIAGNOSIS — G894 Chronic pain syndrome: Secondary | ICD-10-CM | POA: Insufficient documentation

## 2020-09-01 DIAGNOSIS — Z79899 Other long term (current) drug therapy: Secondary | ICD-10-CM | POA: Insufficient documentation

## 2020-09-01 DIAGNOSIS — Z5181 Encounter for therapeutic drug level monitoring: Secondary | ICD-10-CM | POA: Insufficient documentation

## 2020-09-01 DIAGNOSIS — G8254 Quadriplegia, C5-C7 incomplete: Secondary | ICD-10-CM | POA: Insufficient documentation

## 2020-09-01 DIAGNOSIS — K592 Neurogenic bowel, not elsewhere classified: Secondary | ICD-10-CM | POA: Insufficient documentation

## 2020-09-01 DIAGNOSIS — R252 Cramp and spasm: Secondary | ICD-10-CM | POA: Insufficient documentation

## 2020-09-01 DIAGNOSIS — M792 Neuralgia and neuritis, unspecified: Secondary | ICD-10-CM | POA: Insufficient documentation

## 2020-09-01 DIAGNOSIS — R269 Unspecified abnormalities of gait and mobility: Secondary | ICD-10-CM | POA: Insufficient documentation

## 2020-09-01 MED ORDER — PAROXETINE HCL 10 MG PO TABS: 10.0000 mg | ORAL_TABLET | Freq: Every day | ORAL | 5 refills | Status: DC

## 2020-09-01 MED FILL — PAROXETINE HCL 10 MG TABS: 10 | 30 days supply | Qty: 30 | Fill #0

## 2020-09-01 NOTE — Progress Notes (Signed)
Subjective:    Patient ID: Anna Campos, female    DOB: 1966-08-29, 54 y.o.   MRN: [id number]  HPI   Patient is a 54 yr old female with hx ofC5 ASIA D due to osteomyelitis Here for f/u.   Getting more stressed lately- gets excited and overwhelmed easily.  Anxious- not depressed.    Weakness is still about the same in hands and arms.  Tries really hard and working with 2 lb weights, but still about the same.   Ex: can't open medicine bottles, water bottles, or do everything for self.   Called [facility]- and got Enablex.  Needs because has has urinary frequency due to SCI.   Went Saturday for determination of disability-  Got food stamps- then turned them off.   Still waiting for medicaid- still pending.  Said she didn't send in all paperwork- when did.   Pain is now not burning/tingling-  Is now aching- deep ache- can wake up in the middle of night.   Takes tramadol 2 tabs in AM and 2 tabs at night occ takes 2 tabs 3x/day-    Going regularly- bowels-wise- uses [place] and benefiber. Took laxative 1 time- had a "mess".   BP 108/75- today- no dizziness- not lightheaded-   Spasms- mainly in the mornings- hands twist up- and at night- and sometimes whole body will jump.  Thinks dose is "fine'.  Sleeps in recliner - because bed makes her flat and hurts too much.   Feels like cage in low back. Explained likely spasticity.     Pain Inventory Average Pain 10 Pain Right Now 8 My pain is sharp, stabbing and aching  In the last 24 hours, has pain interfered with the following? General activity 7 Relation with others 1 Enjoyment of life 4 What TIME of day is your pain at its worst? morning , daytime, evening and night Sleep (in general) Fair  Pain is worse with: walking, sitting, standing and some activites Pain improves with: medication Relief from Meds: 5  Family History  Problem Relation Age of Onset  . Breast cancer Mother    Social History    Socioeconomic History  . Marital status: Married    Spouse name: Not on file  . Number of children: Not on file  . Years of education: Not on file  . Highest education level: Not on file  Occupational History  . Not on file  Tobacco Use  . Smoking status: Former Games developer  . Smokeless tobacco: Never Used  Vaping Use  . Vaping Use: Never used  Substance and Sexual Activity  . Alcohol use: Yes    Comment: OCCASIONAL  . Drug use: Not Currently  . Sexual activity: Not on file  Other Topics Concern  . Not on file  Social History Narrative  . Not on file   Social Determinants of Health   Financial Resource Strain:   . Difficulty of Paying Living Expenses: Not on file  Food Insecurity:   . Worried About Programme researcher, broadcasting/film/video in the Last Year: Not on file  . Ran Out of Food in the Last Year: Not on file  Transportation Needs:   . Lack of Transportation (Medical): Not on file  . Lack of Transportation (Non-Medical): Not on file  Physical Activity:   . Days of Exercise per Week: Not on file  . Minutes of Exercise per Session: Not on file  Stress:   . Feeling of Stress : Not on file  Social Connections:   . Frequency of Communication with Friends and Family: Not on file  . Frequency of Social Gatherings with Friends and Family: Not on file  . Attends Religious Services: Not on file  . Active Member of Clubs or Organizations: Not on file  . Attends Banker Meetings: Not on file  . Marital Status: Not on file   Past Surgical History:  Procedure Laterality Date  . ANTERIOR CERVICAL DECOMPRESSION/DISCECTOMY FUSION 4 LEVELS N/A 01/04/2020   Procedure: CERVICAL THREE- FOUR, CERVICAL FOUR-FIVE, CERVICAL FIVE- SIX, CERVCAL SIX- SEVEN ANTERIOR CERVICAL DECOMPRESSION/DISCECTOMY FUSION;  Surgeon: Julio Sicks, MD;  Location: [facility] OR;  Service: Neurosurgery;  Laterality: N/A;  CERVICAL THREE- FOUR, CERVICAL FOUR-FIVE, CERVICAL FIVE- SIX, CERVCAL SIX- SEVEN ANTERIOR CERVICAL  DECOMPRESSION/DISCECTOMY FUSION  . CESAREAN SECTION    . ESOPHAGOGASTRODUODENOSCOPY (EGD) WITH PROPOFOL N/A 12/27/2019   Procedure: ESOPHAGOGASTRODUODENOSCOPY (EGD) WITH PROPOFOL;  Surgeon: Kerin Salen, MD;  Location: [facility] ENDOSCOPY;  Service: Gastroenterology;  Laterality: N/A;  PLEASE ALERT ANESTHESIA TO THE FACT THAT THE PATIENT HAS SUSTAINED A NECK INJURY AND HAS A QUADRIPARESIS  . HEMOSTASIS CLIP PLACEMENT  12/27/2019   Procedure: HEMOSTASIS CLIP PLACEMENT;  Surgeon: Kerin Salen, MD;  Location: [facility] ENDOSCOPY;  Service: Gastroenterology;;  . MULTIPLE EXTRACTIONS WITH ALVEOLOPLASTY N/A 12/30/2019   Procedure: Extraction of tooth #'s 2-8, 11-14, and 20-28 with alveoloplasty and bilateral mandibular lingual tori reductions.;  Surgeon: Charlynne Pander, DDS;  Location: [facility] OR;  Service: Oral Surgery;  Laterality: N/A;   Past Surgical History:  Procedure Laterality Date  . ANTERIOR CERVICAL DECOMPRESSION/DISCECTOMY FUSION 4 LEVELS N/A 01/04/2020   Procedure: CERVICAL THREE- FOUR, CERVICAL FOUR-FIVE, CERVICAL FIVE- SIX, CERVCAL SIX- SEVEN ANTERIOR CERVICAL DECOMPRESSION/DISCECTOMY FUSION;  Surgeon: Julio Sicks, MD;  Location: [facility] OR;  Service: Neurosurgery;  Laterality: N/A;  CERVICAL THREE- FOUR, CERVICAL FOUR-FIVE, CERVICAL FIVE- SIX, CERVCAL SIX- SEVEN ANTERIOR CERVICAL DECOMPRESSION/DISCECTOMY FUSION  . CESAREAN SECTION    . ESOPHAGOGASTRODUODENOSCOPY (EGD) WITH PROPOFOL N/A 12/27/2019   Procedure: ESOPHAGOGASTRODUODENOSCOPY (EGD) WITH PROPOFOL;  Surgeon: Kerin Salen, MD;  Location: [facility] ENDOSCOPY;  Service: Gastroenterology;  Laterality: N/A;  PLEASE ALERT ANESTHESIA TO THE FACT THAT THE PATIENT HAS SUSTAINED A NECK INJURY AND HAS A QUADRIPARESIS  . HEMOSTASIS CLIP PLACEMENT  12/27/2019   Procedure: HEMOSTASIS CLIP PLACEMENT;  Surgeon: Kerin Salen, MD;  Location: [facility] ENDOSCOPY;  Service: Gastroenterology;;  . MULTIPLE EXTRACTIONS WITH ALVEOLOPLASTY N/A 12/30/2019   Procedure: Extraction of tooth #'s 2-8, 11-14,  and 20-28 with alveoloplasty and bilateral mandibular lingual tori reductions.;  Surgeon: Charlynne Pander, DDS;  [facility]: [facility];  Service: Oral Surgery;  Laterality: N/A;   Past Medical History:  Diagnosis Date  . PE (pulmonary thromboembolism) (HCC) 12/20/2019  . Syncope 12/20/2019   BP 108/72   Pulse 85   Temp 98.3 F (36.8 C)   Ht 5\' 9"  (1.753 m)   Wt 113 lb 12.8 oz (51.6 kg)   SpO2 98%   BMI 16.81 kg/m   Opioid Risk Score:   Fall Risk Score:  `1  Depression screen PHQ 2/9  Depression screen [facility] 2/9 09/01/2020 08/18/2020 02/16/2020  Decreased Interest 1 2 0  Down, Depressed, Hopeless 1 0 0  PHQ - 2 Score 2 2 0  Altered sleeping - 1 1  Tired, decreased energy - 1 1  Change in appetite - 2 0  Feeling bad or failure about yourself  - 1 2  Trouble concentrating - 0 0  Moving slowly or fidgety/restless - 3 0  Suicidal thoughts - 0 0  PHQ-9 Score - 10 4    Review of Systems  Constitutional: Negative.   HENT: Negative.   Eyes: Negative.   Respiratory: Negative.   Cardiovascular: Negative.   Gastrointestinal: Negative.   Endocrine: Negative.   Musculoskeletal: Positive for back pain, gait problem and neck pain.       Right side arm and leg  Skin: Negative.   Allergic/Immunologic: Negative.   Neurological: Positive for weakness.  Hematological: Negative.   Psychiatric/Behavioral: Positive for dysphoric mood. The patient is nervous/anxious.   All other systems reviewed and are negative.      Objective:   Physical Exam Awake, alert, appropriate, calm currently, but underlying anxious, accompanied by family, NAD  Has rollator  MAS of 3 in LE's almost 4 in LE's.  Mainly in knees- hips much looser B/L- MAS of 2 in hips B/L LUE MAS of 1 RUE- MAS of 2 in R elbow and 2-3 in R shoulder R hand is contracted- but better- less in 1-3 digits- mainly in 4/5th digits.   MS: UEs- biceps 4+/5 B/L- triceps L 5-/5, and R 4+/5, grip 4+/5 B/L; finger abd 4-/5 on L; R  2+/5  LE's- HF 5-/5 B/L- KE 4+/5 B/L; DF and PF 5-/5 B/L        Assessment & Plan:    Patient is a 54 yr old female with hx ofC5 ASIA D due to osteomyelitis Here for f/u.    1. Feeling overwhelmed-  Let's try Paxil/Paroxetine- 10 mg daily- can increase in future if needed. Can increase  In 1 month.  I know she's on Duloxetine- 60 mg- will not go higher than 20 mg on Paxil, just because don't want to cause serotonin syndrome.   2.  Tried to reduce Florinef's dose to 0.1 mg daily- but tried for 1 week, and got dizzy/lightheaded and almost "fall out". - so will continue 2 tabs/day.    3. Con't tramadol- working for pain.  Last refilled 08/16/20-   4. Con't Baclofen 20 mg 3x/day for spasms- if needs an increase in dose, can call me.   5. If legs get tighter, we need to try Dantrolene- to loosen them up- call me- don't wait for appointment-   6.  The more she curled up, the more she will STAY curled up- since laying in recliner, not bed.   7. Talk to PCP about  Menopause :)  8.  Will see her back in 3 months- double appointment  9. CBD oil <3% is legal in [place].   I spent a total of 35 minutes on appointment- as detailed above.

## 2020-09-01 NOTE — Patient Instructions (Signed)
Patient is a 54 yr old female with hx ofC5 ASIA D due to osteomyelitis Here for f/u.    1. Feeling overwhelmed-  Let's try Paxil/Paroxetine- 10 mg daily- can increase in future if needed. Can increase  In 1 month.  I know she's on Duloxetine- 60 mg- will not go higher than 20 mg on Paxil, just because don't want to cause serotonin syndrome.   2.  Tried to reduce Florinef's dose to 0.1 mg daily- but tried for 1 week, and got dizzy/lightheaded and almost "fall out". - so will continue 2 tabs/day.    3. Con't tramadol- working for pain.  Last refilled 08/16/20-   4. Con't Baclofen 20 mg 3x/day for spasms- if needs an increase in dose, can call me.   5. If legs get tighter, we need to try Dantrolene- to loosen them up- call me- don't wait for appointment-   6.  The more she curled up, the more she will STAY curled up- since laying in recliner, not bed.   7. Talk to PCP about  Menopause :)  8.  Will see her back in 3 months- double appointment

## 2020-09-04 LAB — DRUG TOX MONITOR 1 W/CONF, ORAL FLD

## 2020-09-04 LAB — DRUG TOX ALC METAB W/CON, ORAL FLD: Alcohol Metabolite: NEGATIVE ng/mL (ref <25)

## 2020-09-07 ENCOUNTER — Telehealth: Payer: Self-pay | Admitting: *Deleted

## 2020-09-07 NOTE — Telephone Encounter (Signed)
Oral swab drug screen was consistent for prescribed medications.  ?

## 2020-09-11 ENCOUNTER — Ambulatory Visit: Payer: Self-pay | Attending: Internal Medicine

## 2020-09-11 ENCOUNTER — Other Ambulatory Visit: Payer: Self-pay

## 2020-09-12 ENCOUNTER — Telehealth: Payer: Self-pay | Admitting: Internal Medicine

## 2020-09-12 ENCOUNTER — Other Ambulatory Visit: Payer: Self-pay | Admitting: Internal Medicine

## 2020-09-12 MED ORDER — SOLIFENACIN SUCCINATE 5 MG PO TABS: 5.0000 mg | ORAL_TABLET | Freq: Every day | ORAL | 2 refills | Status: DC

## 2020-09-12 NOTE — Telephone Encounter (Signed)
-----   Message from Drucilla Chalet, RPH-CPP sent at 08/31/2020  8:09 AM EST ----- Checked with Joni Reining. Vesicare we can order no problem. ----- Message ----- From: Marcine Matar, MD Sent: 08/30/2020   7:36 PM EST To: Drucilla Chalet, RPH-CPP  How about Vesicare? ----- Message ----- From: Drucilla Chalet, RPH-CPP Sent: 08/30/2020   4:29 PM EST To: Marcine Matar, MD  Dr. Laural Benes,   Would you consider a therapeutic alternative to darifenacin ER for this patient? I was contacted by [facility] in our pharmacy and she states that it's too expensive.   Thank you for considering,   Franky Macho

## 2020-09-13 MED FILL — SOLIFENACIN SUCCINATE 5 MG: 5 | 30 days supply | Qty: 30 | Fill #0

## 2020-09-13 NOTE — Telephone Encounter (Signed)
Contacted pt to go over Dr. Laural Benes message pt states she would like medication to be changed. Pt is aware that rx has been sent to the pharmacy

## 2020-09-18 ENCOUNTER — Other Ambulatory Visit: Payer: Self-pay | Admitting: *Deleted

## 2020-09-18 ENCOUNTER — Other Ambulatory Visit: Payer: Self-pay | Admitting: Physical Medicine and Rehabilitation

## 2020-09-18 MED ORDER — TRAMADOL HCL 50 MG PO TABS: 100.0000 mg | ORAL_TABLET | Freq: Three times a day (TID) | ORAL | 5 refills | Status: DC

## 2020-09-18 MED FILL — traMADol HCL 50 MG TABS: 50 | 30 days supply | Qty: 180 | Fill #0

## 2020-10-10 MED FILL — PAROXETINE HCL 10 MG TABS: 10 | 30 days supply | Qty: 30 | Fill #1

## 2020-10-11 ENCOUNTER — Encounter: Payer: Self-pay | Admitting: Internal Medicine

## 2020-10-11 DIAGNOSIS — K259 Gastric ulcer, unspecified as acute or chronic, without hemorrhage or perforation: Secondary | ICD-10-CM | POA: Insufficient documentation

## 2020-10-11 DIAGNOSIS — K253 Acute gastric ulcer without hemorrhage or perforation: Secondary | ICD-10-CM

## 2020-10-11 DIAGNOSIS — E782 Mixed hyperlipidemia: Secondary | ICD-10-CM | POA: Insufficient documentation

## 2020-10-11 NOTE — Progress Notes (Signed)
Patient's records received from [facility] physician and [facility] Dr. Farris Has.  Records reviewed.  In addition to diagnoses already on the chart, patient has diagnosis of genital herpes and mixed hyperlipidemia. Pap smear that was done 620 12/2018 was normal with negative HPV. Colonoscopy done 05/11/2018 was normal.  Repeat recommended in 10 years.  It was done by Dr. Marca Ancona. EGD done on 12/27/2019: This revealed normal upper third of esophagus, middle third of esophagus and lower third of the esophagus and GE junction.  Erythematous mucosa in the gastric body, antrum and prepyloric region of the stomach and pylorus.  Nonbleeding gastric ulcers with pigmented material.  Clip was placed.  Erythematous to adenopathy. Tdap was listed as being done in 2016 Pneumovax completed 03/07/2014

## 2020-11-17 ENCOUNTER — Other Ambulatory Visit: Payer: Self-pay | Admitting: Internal Medicine

## 2020-11-17 ENCOUNTER — Other Ambulatory Visit: Payer: Self-pay

## 2020-11-17 ENCOUNTER — Ambulatory Visit: Payer: Medicaid Other | Attending: Internal Medicine | Admitting: Internal Medicine

## 2020-11-17 DIAGNOSIS — N319 Neuromuscular dysfunction of bladder, unspecified: Secondary | ICD-10-CM

## 2020-11-17 DIAGNOSIS — G903 Multi-system degeneration of the autonomic nervous system: Secondary | ICD-10-CM

## 2020-11-17 MED ORDER — SOLIFENACIN SUCCINATE 5 MG PO TABS: 5.0000 mg | ORAL_TABLET | Freq: Every day | ORAL | 5 refills | Status: DC

## 2020-11-17 MED FILL — SOLIFENACIN SUCCINATE 5 MG: 5 | 30 days supply | Qty: 30 | Fill #0

## 2020-11-17 NOTE — Progress Notes (Signed)
Virtual Visit via Telephone Note  I connected with Anna Campos on 11/17/20 at 1:38 p.m by telephone and verified that I am speaking with the correct person using two identifiers.  Location: Patient: home Provider: office My pt, my CMA Julius Bowels and myself participated in this encounter. I discussed the limitations, risks, security and privacy concerns of performing an evaluation and management service by telephone and the availability of in person appointments. I also discussed with the patient that there may be a patient responsible charge related to this service. The patient expressed understanding and agreed to proceed.   History of Present Illness: Patient with history of incomplete quadriplegia at C5-6 level, neurogenic bowel, neurogenic bladder, nerve pain, anemia, hypotension, gastric ulcers, PE in the setting of acute illness, HL, genital herpes.  Last visit 07/2020.  Purpose of today's visit is chronic disease management.  Since last visit, I received her records from [facility] physicians.  Please refer to my documentation in the chart on 10/11/2020.  Reports she had a mild cold several wks ago that has resolved.  Request RF on Vesicare  Currently on Paxil and Duloxetine for depress mood by her PMR specialist.  Pt states the Cymbalta was for nerve pain but she does not feel she has nerve pain.  She stopped taking it 4 mths ago.  She feels the Paxil helps very much with depressed mood.  Reports BP staying ok on the Florinef but still runs low with SBP in the 80s and DBP in 60s  I informed her that I di received her records from previous PCP from [facility] Encounter Medications as of 11/17/2020  Medication Sig  . acetaminophen (TYLENOL) 325 MG tablet Take 2 tablets (650 mg total) by mouth every 4 (four) hours as needed for mild pain (temp > 101.5).  . baclofen (LIORESAL) 20 MG tablet Take 1 tablet (20 mg total) by mouth 3 (three) times daily.  . DULoxetine (CYMBALTA) 60 MG  capsule Take 1 capsule (60 mg total) by mouth daily.  . fludrocortisone (FLORINEF) 0.1 MG tablet Take 2 tablets (0.2 mg total) by mouth daily.  Marland Kitchen loratadine (CLARITIN) 10 MG tablet Take 1 tablet (10 mg total) by mouth daily.  . methocarbamol (ROBAXIN) 500 MG tablet Take 1 tablet (500 mg total) by mouth every 8 (eight) hours as needed for muscle spasms. (Patient not taking: Reported on 09/01/2020)  . Multiple Vitamin (MULTIVITAMIN WITH MINERALS) TABS tablet Take 1 tablet by mouth daily.  Marland Kitchen PARoxetine (PAXIL) 10 MG tablet Take 1 tablet (10 mg total) by mouth daily. For mood/anxiety  . solifenacin (VESICARE) 5 MG tablet Take 1 tablet (5 mg total) by mouth daily.  . traMADol (ULTRAM) 50 MG tablet Take 2 tablets (100 mg total) by mouth every 8 (eight) hours.   No facility-administered encounter medications on file as of 11/17/2020.      Observations/Objective:  Depression screen [facility] 2/9 09/01/2020 08/18/2020 02/16/2020  Decreased Interest 1 2 0  Down, Depressed, Hopeless 1 0 0  PHQ - 2 Score 2 2 0  Altered sleeping - 1 1  Tired, decreased energy - 1 1  Change in appetite - 2 0  Feeling bad or failure about yourself  - 1 2  Trouble concentrating - 0 0  Moving slowly or fidgety/restless - 3 0  Suicidal thoughts - 0 0  PHQ-9 Score - 10 4    Results for orders placed or performed in visit on 09/01/20  Drug Tox Monitor 1 w/Conf, Oral Fld  Result Value Ref Range   Amphetamines NEGATIVE <10 ng/mL   Barbiturates NEGATIVE <10 ng/mL   Benzodiazepines NEGATIVE <0.50 ng/mL   Buprenorphine NEGATIVE <0.10 ng/mL   Cocaine NEGATIVE <5.0 ng/mL   Fentanyl NEGATIVE <0.10 ng/mL   Heroin Metabolite NEGATIVE <1.0 ng/mL   MARIJUANA NEGATIVE <2.5 ng/mL   MDMA NEGATIVE <10 ng/mL   Meprobamate NEGATIVE <2.5 ng/mL   Methadone NEGATIVE <5.0 ng/mL   Nicotine Metabolite NEGATIVE <5.0 ng/mL   Opiates NEGATIVE <2.5 ng/mL   Phencyclidine NEGATIVE <10 ng/mL   Tapentadol NEGATIVE <5.0 ng/mL   Tramadol POSITIVE  (A) <5.0 ng/mL   Tramadol >500.0 (H) <5.0 ng/mL   Zolpidem NEGATIVE <5.0 ng/mL  Drug Tox Alc Metab w/Con, Oral Fld  Result Value Ref Range   Alcohol Metabolite NEGATIVE <25 ng/mL   Lab Results  Component Value Date   WBC 6.2 08/18/2020   HGB 11.8 08/18/2020   HCT 36.7 08/18/2020   MCV 91 08/18/2020   PLT 291 08/18/2020     Chemistry      Component Value Date/Time   NA 142 08/18/2020 1520   K 4.1 08/18/2020 1520   CL 104 08/18/2020 1520   CO2 26 08/18/2020 1520   BUN 12 08/18/2020 1520   CREATININE 0.78 08/18/2020 1520      Component Value Date/Time   CALCIUM 9.6 08/18/2020 1520   ALKPHOS 114 08/18/2020 1520   AST 24 08/18/2020 1520   ALT 13 08/18/2020 1520   BILITOT 0.4 08/18/2020 1520       Assessment and Plan: 1. Neurogenic bladder - solifenacin (VESICARE) 5 MG tablet; Take 1 tablet (5 mg total) by mouth daily.  Dispense: 30 tablet; Refill: 5  2. Neurogenic orthostatic hypotension (HCC) Continue Florinef   Follow Up Instructions: 4 mths   I discussed the assessment and treatment plan with the patient. The patient was provided an opportunity to ask questions and all were answered. The patient agreed with the plan and demonstrated an understanding of the instructions.   The patient was advised to call back or seek an in-person evaluation if the symptoms worsen or if the condition fails to improve as anticipated.  I provided 17 minutes of non-face-to-face time during this encounter.   Jonah Blue, MD

## 2020-12-01 ENCOUNTER — Encounter: Payer: Self-pay | Attending: Physical Medicine and Rehabilitation | Admitting: Physical Medicine and Rehabilitation

## 2020-12-01 ENCOUNTER — Encounter: Payer: Self-pay | Admitting: Physical Medicine and Rehabilitation

## 2020-12-01 ENCOUNTER — Other Ambulatory Visit: Payer: Self-pay

## 2020-12-01 ENCOUNTER — Other Ambulatory Visit: Payer: Self-pay | Admitting: Physical Medicine and Rehabilitation

## 2020-12-01 VITALS — BP 99/67 | HR 86 | Temp 98.4°F | Ht 69.0 in | Wt 113.4 lb

## 2020-12-01 DIAGNOSIS — G894 Chronic pain syndrome: Secondary | ICD-10-CM | POA: Insufficient documentation

## 2020-12-01 DIAGNOSIS — R252 Cramp and spasm: Secondary | ICD-10-CM | POA: Insufficient documentation

## 2020-12-01 DIAGNOSIS — N319 Neuromuscular dysfunction of bladder, unspecified: Secondary | ICD-10-CM | POA: Insufficient documentation

## 2020-12-01 DIAGNOSIS — R269 Unspecified abnormalities of gait and mobility: Secondary | ICD-10-CM | POA: Insufficient documentation

## 2020-12-01 DIAGNOSIS — G8254 Quadriplegia, C5-C7 incomplete: Secondary | ICD-10-CM | POA: Insufficient documentation

## 2020-12-01 DIAGNOSIS — M545 Low back pain, unspecified: Secondary | ICD-10-CM | POA: Insufficient documentation

## 2020-12-01 MED ORDER — TRAMADOL HCL 50 MG PO TABS: 100.0000 mg | ORAL_TABLET | Freq: Three times a day (TID) | ORAL | 5 refills | Status: DC

## 2020-12-01 MED ORDER — DANTROLENE SODIUM 50 MG PO CAPS: 50.0000 mg | ORAL_CAPSULE | Freq: Four times a day (QID) | ORAL | 5 refills | Status: DC

## 2020-12-01 MED ORDER — PAROXETINE HCL 20 MG PO TABS: 20.0000 mg | ORAL_TABLET | Freq: Every day | ORAL | 5 refills | Status: DC

## 2020-12-01 MED FILL — ?PAROXETINE HCL 20 MG TABLE: 20 | 30 days supply | Qty: 30 | Fill #0

## 2020-12-01 MED FILL — traMADol HCL 50 MG TABS: 50 | 30 days supply | Qty: 180 | Fill #0

## 2020-12-01 MED FILL — DANTROLENE SODIUM 50 MG CAP: 50 | 26 days supply | Qty: 120 | Fill #0

## 2020-12-01 NOTE — Patient Instructions (Addendum)
  Patient is a 55 yr old female with incomplete Quadriplegia-C5 ASIA D due to osteomyelitis with neurogenic bowel and bladder and spasticity as well as chronic nerve pain. Also has some depression due to her hx as well as orthostatic hypotension. .   1. Takes on temperature of room/area surrounding- take care not to get too cold or too warm.   2.Tramadol for chronic back pain- 100 mg 3x/day - will increase frequency to 3x/day- if you take with tylenol- it will last longer.  Gets meds from [facility]. Already has prescription for 100 mg q8 hours- which is 3x/day. So, takes 3x/day with 1 tylenol.    3. Need to start Dantrolene- spasticity is getting worse and if I don't add, not exchanging, she will end up contracted in a little ball..  Dantrolene as below  Side effects- loose stools, sedation, and possible mild weakness  Start 50 mg nightly x 1 week Then 50 mg 2x/day x 1 week Then 50 mg 3x/day x 1 week (cna take 3rd dose with 1 of other doses) Then 100 mg 2x/day - until dose changed  Needs CMP/LFTs/labs checked in 6 weeks, , 3 months and every 6 months while on medication    4. On Vesicare for bladder- neurogenic bladder.     5. Needs a letter- stating disability.   6. Increase Paroxetine/paxil to 20 mg daily- for mood.    7. F/U in 6 weeks-  For CMP at that visit- Double visit-

## 2020-12-01 NOTE — Progress Notes (Signed)
Subjective:    Patient ID: Anna Campos, female    DOB: October 25, 1965, 55 y.o.   MRN: [id number]  HPI  Patient is a 55 yr old female with incomplete Quadriplegia-C5 ASIA D due to osteomyelitis with neurogenic bowel and bladder and spasticity as well as chronic nerve pain. Also has some depression due to her hx as well as orthostatic hypotension. Marland Kitchen   Here for f/u.  Is now only on 6 pills. Very excited about it. Felt was on too many pills.   Still on: Baclofen 20 mg 3x/day Paxil 10 mg daily Florinef 0.1 mg daily now- no dizziness Tramadol- 100 BID- works very well for her, per pt.  Vesicare 5 mg daily- PCP changed it- .   Stopped: Duloxetine    Takes on the temperature of the room-   Rating pain 9/10- when cold- or depending on what does around house.    Has learned to stop and only do things when people are home.   Fell 2 weeks ago- used old bedroom shoes and stepped into bathtub and phone rang- was slippery and slipped on floor-  Butt was real sore and back sore for 1 week.   Been a little depressed lately- just sitting at home- used to going when wants to- now cannot be independent; always needs help.  Likes Paxil better than Duloxetine-   Has no more nerve pain, so wants to come off Duloxetine completely. Has already stopped it.   Still having difficulties with spasticity- is the same per pt- not worse-just stiff with prolonged sitting, etc. Still worse at night-/early in AM.  Worse due to rain/cold weather.  Occ has actual spasms of fingers or legs.   Sleeping in a chair, recliner, not bed.   Back is the cause of pain 9/10- tramadol helps- "helps but doesn't last long enough".  Wears off by lunch time-   Just got Disability as of next month!   Pain Inventory Average Pain 9 Pain Right Now 9 My pain is aching  LOCATION OF PAIN  Neck and back  BOWEL Number of stools per week: 3 Oral laxative use Yes  Type of laxativeTEA Enema or suppository use No   History of colostomy No  Incontinent No   BLADDER Normal In and out cath, frequency  Able to self cath No  Bladder incontinence No  Frequent urination No  Leakage with coughing No  Difficulty starting stream No  Incomplete bladder emptying No    Mobility walk with assistance use a walker ability to climb steps?  yes do you drive?  yes  Function not employed: date last employed . I need assistance with the following:  bathing, meal prep, household duties and shopping  Neuro/Psych weakness spasms confusion depression anxiety  Prior Studies Any changes since last visit?  no  Physicians involved in your care Any changes since last visit?  no   Family History  Problem Relation Age of Onset  . Breast cancer Mother    Social History   Socioeconomic History  . Marital status: Married    Spouse name: Not on file  . Number of children: Not on file  . Years of education: Not on file  . Highest education level: Not on file  Occupational History  . Not on file  Tobacco Use  . Smoking status: Former Games developer  . Smokeless tobacco: Never Used  Vaping Use  . Vaping Use: Never used  Substance and Sexual Activity  . Alcohol use: Yes  Comment: OCCASIONAL  . Drug use: Not Currently  . Sexual activity: Not on file  Other Topics Concern  . Not on file  Social History Narrative  . Not on file   Social Determinants of Health   Financial Resource Strain: Not on file  Food Insecurity: Not on file  Transportation Needs: Not on file  Physical Activity: Not on file  Stress: Not on file  Social Connections: Not on file   Past Surgical History:  Procedure Laterality Date  . ANTERIOR CERVICAL DECOMPRESSION/DISCECTOMY FUSION 4 LEVELS N/A 01/04/2020   Procedure: CERVICAL THREE- FOUR, CERVICAL FOUR-FIVE, CERVICAL FIVE- SIX, CERVCAL SIX- SEVEN ANTERIOR CERVICAL DECOMPRESSION/DISCECTOMY FUSION;  Surgeon: Julio Sicks, MD;  Location: [facility] OR;  Service: Neurosurgery;  Laterality:  N/A;  CERVICAL THREE- FOUR, CERVICAL FOUR-FIVE, CERVICAL FIVE- SIX, CERVCAL SIX- SEVEN ANTERIOR CERVICAL DECOMPRESSION/DISCECTOMY FUSION  . CESAREAN SECTION    . ESOPHAGOGASTRODUODENOSCOPY (EGD) WITH PROPOFOL N/A 12/27/2019   Procedure: ESOPHAGOGASTRODUODENOSCOPY (EGD) WITH PROPOFOL;  Surgeon: Kerin Salen, MD;  Location: [facility] ENDOSCOPY;  Service: Gastroenterology;  Laterality: N/A;  PLEASE ALERT ANESTHESIA TO THE FACT THAT THE PATIENT HAS SUSTAINED A NECK INJURY AND HAS A QUADRIPARESIS  . HEMOSTASIS CLIP PLACEMENT  12/27/2019   Procedure: HEMOSTASIS CLIP PLACEMENT;  Surgeon: Kerin Salen, MD;  Location: [facility] ENDOSCOPY;  Service: Gastroenterology;;  . MULTIPLE EXTRACTIONS WITH ALVEOLOPLASTY N/A 12/30/2019   Procedure: Extraction of tooth #'s 2-8, 11-14, and 20-28 with alveoloplasty and bilateral mandibular lingual tori reductions.;  Surgeon: Charlynne Pander, DDS;  Location: [facility] OR;  Service: Oral Surgery;  Laterality: N/A;   Past Medical History:  Diagnosis Date  . Genital herpes   . PE (pulmonary thromboembolism) (HCC) 12/20/2019  . Syncope 12/20/2019   BP 99/67   Pulse 86   Temp 98.4 F (36.9 C)   Ht 5\' 9"  (1.753 m)   Wt 113 lb 6.4 oz (51.4 kg)   SpO2 98%   BMI 16.75 kg/m   Opioid Risk Score:   Fall Risk Score:  `1  Depression screen PHQ 2/9  Depression screen [facility] 2/9 12/01/2020 09/01/2020 08/18/2020 02/16/2020  Decreased Interest 1 1 2  0  Down, Depressed, Hopeless 1 1 0 0  PHQ - 2 Score 2 2 2  0  Altered sleeping - - 1 1  Tired, decreased energy - - 1 1  Change in appetite - - 2 0  Feeling bad or failure about yourself  - - 1 2  Trouble concentrating - - 0 0  Moving slowly or fidgety/restless - - 3 0  Suicidal thoughts - - 0 0  PHQ-9 Score - - 10 4   Review of Systems  Constitutional: Negative.   HENT: Negative.   Eyes: Negative.   Respiratory: Negative.   Cardiovascular: Negative.   Gastrointestinal: Negative.   Endocrine: Negative.   Genitourinary: Negative.    Musculoskeletal: Positive for gait problem.       Neck and back pain  Skin: Negative.   Allergic/Immunologic: Negative.   Neurological: Positive for weakness.  Hematological: Negative.   Psychiatric/Behavioral: Positive for confusion and dysphoric mood. The patient is nervous/anxious.   All other systems reviewed and are negative.      Objective:   Physical Exam  Awake, alert, appropriate, using rollator, NAD Neuro: MAS of 3 in R hand, wrist, elbow and shoulder; slightly less 2-3 in LUE RLE MAS of 4- and MAS of 4 in RLE- which is a lot more than last visit- and MAS of 3-4 in LLE- at ankle, knee  and hip.      Assessment & Plan:    Patient is a 55 yr old female with incomplete Quadriplegia-C5 ASIA D due to osteomyelitis with neurogenic bowel and bladder and spasticity as well as chronic nerve pain. Also has some depression due to her hx as well as orthostatic hypotension. .   1. Takes on temperature of room/area surrounding- take care not to get too cold or too warm.   2.Tramadol for chronic back pain- 100 mg 3x/day - will increase frequency to 3x/day- if you take with tylenol- it will last longer.  Gets meds from [facility] and [facility]. Already has prescription for 100 mg q8 hours- which is 3x/day. So, takes 3x/day with 1 tylenol.    3. Need to start Dantrolene- spasticity is getting worse and if I don't add, not exchanging, she will end up contracted in a little ball..  Dantrolene as below  Side effects- loose stools, sedation, and possible mild weakness  Start 50 mg nightly x 1 week Then 50 mg 2x/day x 1 week Then 50 mg 3x/day x 1 week (cna take 3rd dose with 1 of other doses) Then 100 mg 2x/day - until dose changed  Needs CMP/LFTs/labs checked in 6 weeks, , 3 months and every 6 months while on medication    4. On Vesicare for bladder- neurogenic bladder.     5. Needs a letter- will write a letter for pt- saying unable to work.   6. Increase  Paroxetine/paxil to 20 mg daily- for mood.    7. F/U in 6 weeks-  For CMP at that visit- Double visit-  I spent a total of 35 minutes on visit- as detailed above.

## 2020-12-30 ENCOUNTER — Encounter (HOSPITAL_COMMUNITY): Payer: Self-pay | Admitting: Emergency Medicine

## 2020-12-30 ENCOUNTER — Emergency Department (HOSPITAL_COMMUNITY)
Admission: EM | Admit: 2020-12-30 | Discharge: 2020-12-30 | Disposition: A | Discharge status: Home / Self Care | Payer: Medicaid Other | Attending: Emergency Medicine | Admitting: Emergency Medicine

## 2020-12-30 ENCOUNTER — Other Ambulatory Visit: Payer: Self-pay

## 2020-12-30 DIAGNOSIS — A084 Viral intestinal infection, unspecified: Secondary | ICD-10-CM | POA: Insufficient documentation

## 2020-12-30 DIAGNOSIS — Z87891 Personal history of nicotine dependence: Secondary | ICD-10-CM | POA: Insufficient documentation

## 2020-12-30 DIAGNOSIS — Z20822 Contact with and (suspected) exposure to covid-19: Secondary | ICD-10-CM | POA: Insufficient documentation

## 2020-12-30 LAB — RESP PANEL BY RT-PCR (FLU A&B, COVID) ARPGX2
Influenza A by PCR: NEGATIVE
Influenza B by PCR: NEGATIVE
SARS Coronavirus 2 by RT PCR: NEGATIVE

## 2020-12-30 LAB — CBC WITH DIFFERENTIAL/PLATELET
Abs Immature Granulocytes: 0.02 10*3/uL (ref 0.00–0.07)
Basophils Absolute: 0 10*3/uL (ref 0.0–0.1)
Basophils Relative: 1 %
Eosinophils Absolute: 0.1 10*3/uL (ref 0.0–0.5)
Eosinophils Relative: 2 %
HCT: 42.3 % (ref 36.0–46.0)
Hemoglobin: 13.9 g/dL (ref 12.0–15.0)
Immature Granulocytes: 0 %
Lymphocytes Relative: 34 %
Lymphs Abs: 2 10*3/uL (ref 0.7–4.0)
MCH: 29.5 pg (ref 26.0–34.0)
MCHC: 32.9 g/dL (ref 30.0–36.0)
MCV: 89.8 fL (ref 80.0–100.0)
Monocytes Absolute: 0.4 10*3/uL (ref 0.1–1.0)
Monocytes Relative: 7 %
Neutro Abs: 3.3 10*3/uL (ref 1.7–7.7)
Neutrophils Relative %: 56 %
Platelets: 276 10*3/uL (ref 150–400)
RBC: 4.71 MIL/uL (ref 3.87–5.11)
RDW: 12 % (ref 11.5–15.5)
WBC: 5.8 10*3/uL (ref 4.0–10.5)
nRBC: 0 % (ref 0.0–0.2)

## 2020-12-30 LAB — COMPREHENSIVE METABOLIC PANEL
ALT: 11 U/L (ref 0–44)
AST: 20 U/L (ref 15–41)
Albumin: 4.6 g/dL (ref 3.5–5.0)
Alkaline Phosphatase: 96 U/L (ref 38–126)
Anion gap: 12 (ref 5–15)
BUN: 25 mg/dL — ABNORMAL HIGH (ref 6–20)
CO2: 26 mmol/L (ref 22–32)
Calcium: 10.3 mg/dL (ref 8.9–10.3)
Chloride: 103 mmol/L (ref 98–111)
Creatinine, Ser: 0.79 mg/dL (ref 0.44–1.00)
GFR, Estimated: >60 mL/min (ref >60)
Glucose, Bld: 96 mg/dL (ref 70–99)
Potassium: 4.3 mmol/L (ref 3.5–5.1)
Sodium: 141 mmol/L (ref 135–145)
Total Bilirubin: 0.8 mg/dL (ref 0.3–1.2)
Total Protein: 8 g/dL (ref 6.5–8.1)

## 2020-12-30 LAB — LIPASE, BLOOD: Lipase: 41 U/L (ref 11–51)

## 2020-12-30 MED ORDER — METOCLOPRAMIDE HCL 10 MG PO TABS: 10.0000 mg | ORAL_TABLET | Freq: Four times a day (QID) | ORAL | 0 refills | Status: DC

## 2020-12-30 MED ORDER — DICYCLOMINE HCL 20 MG PO TABS: 20.0000 mg | ORAL_TABLET | Freq: Two times a day (BID) | ORAL | 0 refills | Status: DC

## 2020-12-30 MED ORDER — SODIUM CHLORIDE 0.9 % IV BOLUS
1000.0000 mL | Freq: Once | INTRAVENOUS | Status: AC
  Administered 2020-12-30: 1000 mL via INTRAVENOUS

## 2020-12-30 NOTE — ED Triage Notes (Signed)
Patient reports weakness with tinnitus this week and abdominal pain with N/V today. Denies diarrhea.

## 2020-12-30 NOTE — ED Provider Notes (Signed)
[facility]-EMERGENCY DEPT Provider Note   CSN: [id number] Arrival date & time: 12/30/20  1810     History Chief Complaint  Patient presents with  . Emesis    Anna Campos is a 55 y.o. female.  HPI 55 year old female with a history of PE, genital herpes incomplete quadriplegia at C5-6 presents to the ER with complaints of 3 to 4 days of nausea, vomiting, and watery diarrhea.  She reports that she thinks that this is a "stomach bug", as her husband and daughter had similar symptoms last week.  She denies any fevers or chills.  She reports that her husband came to the ER, received him IV fluids and felt a lot better.  She denies any current nausea at this time.  Denies any dysuria or hematuria.  No flank pain.  She states that when she gets nauseous and feels like she is about to throw up, she gets ringing in her ears.  She reports that she comes to the ER because she "does not like feeling like this".  She has no other complaints at this time, no chest pain, shortness of breath.     Past Medical History:  Diagnosis Date  . Genital herpes   . PE (pulmonary thromboembolism) (HCC) 12/20/2019  . Syncope 12/20/2019    Patient Active Problem List   Diagnosis Date Noted  . Chronic pain syndrome 12/01/2020  . Mixed hyperlipidemia 10/11/2020  . Gastric ulcer 10/11/2020  . Spasticity 02/16/2020  . Nerve pain 02/16/2020  . Impaired gait 02/16/2020  . Pressure injury of skin 01/15/2020  . Transaminitis   . Hyponatremia   . Anemia   . Neurogenic bowel 01/11/2020  . Neurogenic bladder 01/11/2020  . Malnutrition of moderate degree 01/01/2020  . Poor dentition   . Spinal stenosis of cervical region   . Incomplete quadriplegia at C5-6 level (HCC)   . Hypotension   . Bacteremia   . Syncope 12/19/2019    Past Surgical History:  Procedure Laterality Date  . ANTERIOR CERVICAL DECOMPRESSION/DISCECTOMY FUSION 4 LEVELS N/A 01/04/2020   Procedure: CERVICAL THREE- FOUR,  CERVICAL FOUR-FIVE, CERVICAL FIVE- SIX, CERVCAL SIX- SEVEN ANTERIOR CERVICAL DECOMPRESSION/DISCECTOMY FUSION;  Surgeon: Julio Sicks, MD;  Location: [facility] OR;  Service: Neurosurgery;  Laterality: N/A;  CERVICAL THREE- FOUR, CERVICAL FOUR-FIVE, CERVICAL FIVE- SIX, CERVCAL SIX- SEVEN ANTERIOR CERVICAL DECOMPRESSION/DISCECTOMY FUSION  . CESAREAN SECTION    . ESOPHAGOGASTRODUODENOSCOPY (EGD) WITH PROPOFOL N/A 12/27/2019   Procedure: ESOPHAGOGASTRODUODENOSCOPY (EGD) WITH PROPOFOL;  Surgeon: Kerin Salen, MD;  Location: [facility] ENDOSCOPY;  Service: Gastroenterology;  Laterality: N/A;  PLEASE ALERT ANESTHESIA TO THE FACT THAT THE PATIENT HAS SUSTAINED A NECK INJURY AND HAS A QUADRIPARESIS  . HEMOSTASIS CLIP PLACEMENT  12/27/2019   Procedure: HEMOSTASIS CLIP PLACEMENT;  Surgeon: Kerin Salen, MD;  Location: [facility] ENDOSCOPY;  Service: Gastroenterology;;  . MULTIPLE EXTRACTIONS WITH ALVEOLOPLASTY N/A 12/30/2019   Procedure: Extraction of tooth #'s 2-8, 11-14, and 20-28 with alveoloplasty and bilateral mandibular lingual tori reductions.;  Surgeon: Charlynne Pander, DDS;  Location: [facility] OR;  Service: Oral Surgery;  Laterality: N/A;     OB History   No obstetric history on file.     Family History  Problem Relation Age of Onset  . Breast cancer Mother     Social History   Tobacco Use  . Smoking status: Former Games developer  . Smokeless tobacco: Never Used  Vaping Use  . Vaping Use: Never used  Substance Use Topics  . Alcohol use: Yes  Comment: OCCASIONAL  . Drug use: Not Currently    Home Medications Prior to Admission medications   Medication Sig Start Date End Date Taking? Authorizing Provider  acetaminophen (TYLENOL) 325 MG tablet Take 2 tablets (650 mg total) by mouth every 4 (four) hours as needed for mild pain (temp > 101.5). 02/04/20   Angiulli, Mcarthur Rossetti, PA-C  baclofen (LIORESAL) 20 MG tablet Take 1 tablet (20 mg total) by mouth 3 (three) times daily. 05/29/20   Lovorn, Aundra Millet, MD  dantrolene (DANTRIUM) 50 MG  capsule Take 1 capsule (50 mg total) by mouth 4 (four) times daily. Start with 50 mg QHS x 1 week, then BID x 1 week, then TID x 1 week, then 100 mg BID- for spasticity in ADDITION to baclofen. 12/01/20   Lovorn, Aundra Millet, MD  dicyclomine (BENTYL) 20 MG tablet Take 1 tablet (20 mg total) by mouth 2 (two) times daily. 12/30/20  Yes Mare Ferrari, PA-C  DULoxetine (CYMBALTA) 60 MG capsule Take 1 capsule (60 mg total) by mouth daily. 08/16/20   Lovorn, Aundra Millet, MD  fludrocortisone (FLORINEF) 0.1 MG tablet Take 2 tablets (0.2 mg total) by mouth daily. 08/16/20   Lovorn, Aundra Millet, MD  loratadine (CLARITIN) 10 MG tablet Take 1 tablet (10 mg total) by mouth daily. 08/28/20   Marcine Matar, MD  methocarbamol (ROBAXIN) 500 MG tablet Take 1 tablet (500 mg total) by mouth every 8 (eight) hours as needed for muscle spasms. 02/04/20   Angiulli, Mcarthur Rossetti, PA-C  metoCLOPramide (REGLAN) 10 MG tablet Take 1 tablet (10 mg total) by mouth every 6 (six) hours. 12/30/20  Yes Mare Ferrari, PA-C  Multiple Vitamin (MULTIVITAMIN WITH MINERALS) TABS tablet Take 1 tablet by mouth daily.    [provider]  PARoxetine (PAXIL) 20 MG tablet Take 1 tablet (20 mg total) by mouth daily. 12/01/20   Lovorn, Aundra Millet, MD  solifenacin (VESICARE) 5 MG tablet Take 1 tablet (5 mg total) by mouth daily. 11/17/20   Marcine Matar, MD  traMADol (ULTRAM) 50 MG tablet Take 2 tablets (100 mg total) by mouth every 8 (eight) hours. 12/01/20   Lovorn, Aundra Millet, MD    Allergies    Ampicillin and Chlorhexidine  Review of Systems   Review of Systems  Constitutional: Negative for chills and fever.  HENT: Negative for ear pain and sore throat.   Eyes: Negative for pain and visual disturbance.  Respiratory: Negative for cough and shortness of breath.   Cardiovascular: Negative for chest pain and palpitations.  Gastrointestinal: Positive for diarrhea, nausea and vomiting. Negative for abdominal pain.  Genitourinary: Negative for dysuria and  hematuria.  Musculoskeletal: Negative for arthralgias and back pain.  Skin: Negative for color change and rash.  Neurological: Negative for seizures and syncope.  All other systems reviewed and are negative.   Physical Exam Updated Vital Signs BP 110/72 (BP Location: Right Arm)   Pulse 86   Temp 98.3 F (36.8 C) (Oral)   Resp 18   SpO2 100%   Physical Exam Vitals and nursing note reviewed.  Constitutional:      General: She is not in acute distress.    Appearance: She is well-developed. She is not ill-appearing, toxic-appearing or diaphoretic.  HENT:     Head: Normocephalic and atraumatic.  Eyes:     Conjunctiva/sclera: Conjunctivae normal.  Cardiovascular:     Rate and Rhythm: Normal rate and regular rhythm.     Heart sounds: No murmur heard.   Pulmonary:  Effort: Pulmonary effort is normal. No respiratory distress.     Breath sounds: Normal breath sounds.  Abdominal:     Palpations: Abdomen is soft.     Tenderness: There is no abdominal tenderness. There is no right CVA tenderness or left CVA tenderness.  Musculoskeletal:     Cervical back: Neck supple.  Skin:    General: Skin is warm and dry.     Capillary Refill: Capillary refill takes less than 2 seconds.  Neurological:     General: No focal deficit present.     Mental Status: She is alert and oriented to person, place, and time.  Psychiatric:        Mood and Affect: Mood normal.        Behavior: Behavior normal.     ED Results / Procedures / Treatments   Labs (all labs ordered are listed, but only abnormal results are displayed) Labs Reviewed  COMPREHENSIVE METABOLIC PANEL - Abnormal; Notable for the following components:      Result Value   BUN 25 (*)    All other components within normal limits  RESP PANEL BY RT-PCR (FLU A&B, COVID) ARPGX2  CBC WITH DIFFERENTIAL/PLATELET  LIPASE, BLOOD  URINALYSIS, ROUTINE W REFLEX MICROSCOPIC    EKG None  Radiology No results  found.  Procedures Procedures   Medications Ordered in ED Medications  sodium chloride 0.9 % bolus 1,000 mL (0 mLs Intravenous Stopped 12/30/20 2119)    ED Course  I have reviewed the triage vital signs and the nursing notes.  Pertinent labs & imaging results that were available during my care of the patient were reviewed by me and considered in my medical decision making (see chart for details).    MDM Rules/Calculators/A&P                          55 year old female presents to the ER with nausea, vomiting, and diarrhea.  She reports that her husband and daughter had similar symptoms last week.  She believes this is viral gastroenteritis.  She has no abdominal tenderness on exam, no flank tenderness.  Low suspicion for emergent/surgical abdominal pathology.  Vitals are overall reassuring.  Will check lab work, IV fluids, plan for discharge with Zofran and Bentyl.  I personally reviewed her lab work which was overall very reassuring.  CBC and CMP largely unremarkable, BUN of 25, likely secondary to mild dehydration.  Lipase is normal.  Patient was unable to provide urine sample, but denies any urinary symptoms.  Low suspicion for UTI at this time.  Covid test is negative.  Patient received 1 L of fluids, notes she is feeling better.  Will discharge with Zofran, Bentyl.  Return precautions discussed.  She voiced understanding and is agreeable.  At this stage in the ED course, the patient is medically screened and stable for discharge. Final Clinical Impression(s) / ED Diagnoses Final diagnoses:  Viral gastroenteritis    Rx / DC Orders ED Discharge Orders         Ordered    metoCLOPramide (REGLAN) 10 MG tablet  Every 6 hours        12/30/20 2117    dicyclomine (BENTYL) 20 MG tablet  2 times daily        12/30/20 2117           Leone Brand 12/30/20 2121    Koleen Distance, MD 12/30/20 2159

## 2020-12-30 NOTE — Discharge Instructions (Signed)
Your lab work today was overall reassuring.  This did not show any abnormalities.  Covid test was negative.  As discussed, this is probably a viral stomach bug.  Please use the nausea medicine as needed, drink plenty fluids.  You may start introducing bland foods into your diet as tolerated.  You may use Bentyl for stomach pain if needed.  Return to the ER for any new or worsening symptoms.

## 2021-01-01 ENCOUNTER — Telehealth: Payer: Self-pay | Admitting: Internal Medicine

## 2021-01-01 ENCOUNTER — Telehealth: Payer: Self-pay | Admitting: *Deleted

## 2021-01-01 NOTE — Telephone Encounter (Signed)
Transition Care Management Follow-up Telephone Call  Date of discharge and from where: 12/30/2020 - [facility]  How have you been since you were released from the hospital? "Feeling a little better"  Any questions or concerns? No  Items Reviewed:  Did the pt receive and understand the discharge instructions provided? Yes   Medications obtained and verified? Yes   Other? No   Any new allergies since your discharge? No   Dietary orders reviewed? Yes  Do you have support at home? Yes   Home Care and Equipment/Supplies: Were home health services ordered? not applicable If so, what is the name of the agency? N/A  Has the agency set up a time to come to the patient's home? not applicable Were any new equipment or medical supplies ordered?  No What is the name of the medical supply agency? N/A Were you able to get the supplies/equipment? not applicable Do you have any questions related to the use of the equipment or supplies? No  Functional Questionnaire: (I = Independent and D = Dependent) ADLs: I  Bathing/Dressing- I  Meal Prep- I  Eating- I  Maintaining continence- I  Transferring/Ambulation- I  Managing Meds- I  Follow up appointments reviewed:   PCP Hospital f/u appt confirmed? Yes  Scheduled to see Dr. Laural Benes on 02/12/2021 @ 0830.  Specialist Hospital f/u appt confirmed? No  S  Are transportation arrangements needed? No   If their condition worsens, is the pt aware to call PCP or go to the Emergency Dept.? Yes  Was the patient provided with contact information for the PCP's office or ED? Yes  Was to pt encouraged to call back with questions or concerns? Yes

## 2021-01-01 NOTE — Telephone Encounter (Signed)
Pt is calling to speak to Mikle Bosworth- has pt orange card application been processed and approved. Patient states that she has outstanding balance. And is needing finical assistance to receive her medications. Please advise CB- [phone]

## 2021-01-02 NOTE — Telephone Encounter (Signed)
I return Ot call, LVM inform that she need to stop by the pharmacy clinic and ask to speak with Mrs. Tresa Endo she need a signature from you to get her insulin, please follow up

## 2021-01-12 ENCOUNTER — Ambulatory Visit: Payer: Medicaid Other | Admitting: Physical Medicine and Rehabilitation

## 2021-01-17 ENCOUNTER — Telehealth: Payer: Self-pay

## 2021-01-17 NOTE — Telephone Encounter (Signed)
Needs letter that Dr. Berline Chough typed up for her about her condition. She misplaced the last one. Asked to be mailed.

## 2021-02-12 ENCOUNTER — Ambulatory Visit: Payer: Medicaid Other | Admitting: Internal Medicine

## 2021-03-23 ENCOUNTER — Other Ambulatory Visit: Payer: Self-pay

## 2021-03-23 MED FILL — Paroxetine HCl Tab 20 MG: ORAL | 30 days supply | Qty: 30 | Fill #0 | Status: AC

## 2021-05-11 ENCOUNTER — Encounter: Payer: Self-pay | Admitting: Physical Medicine and Rehabilitation

## 2021-06-26 ENCOUNTER — Other Ambulatory Visit: Payer: Self-pay | Admitting: Internal Medicine

## 2021-06-26 NOTE — Telephone Encounter (Signed)
Medication: Rx #: [id number] PARoxetine (PAXIL) 20 MG tablet [[id number]] Apt 02/12/21   Has the patient contacted their pharmacy? YES  (Agent: If no, request that the patient contact the pharmacy for the refill.) (Agent: If yes, when and what did the pharmacy advise?)  Preferred Pharmacy (with phone number or street name): [facility] [address] [place] [address] Phone: [phone] Fax: [phone] Hours: M-F 8:30a-5:30p    Agent: Please be advised that RX refills may take up to 3 business days. We ask that you follow-up with your pharmacy.

## 2021-06-27 ENCOUNTER — Other Ambulatory Visit: Payer: Self-pay

## 2021-06-27 MED ORDER — PAROXETINE HCL 20 MG PO TABS
ORAL_TABLET | Freq: Every day | ORAL | 2 refills | Status: DC
  Filled 2021-06-27: qty 30, 30d supply, fill #0

## 2021-06-27 NOTE — Telephone Encounter (Signed)
Requested Prescriptions  Pending Prescriptions Disp Refills  . PARoxetine (PAXIL) 20 MG tablet 30 tablet 2    Sig: TAKE 1 TABLET (20 MG TOTAL) BY MOUTH DAILY.     Psychiatry:  Antidepressants - SSRI Failed - 06/26/2021  8:48 PM      Failed - Valid encounter within last 6 months    Recent Outpatient Visits          7 months ago Neurogenic bladder   [facility] Marcine Matar, MD   10 months ago Establishing care with new doctor, encounter for   [facility] Marcine Matar, MD      Future Appointments            In 1 month Laural Benes Binnie Rail, MD [facility]

## 2021-07-25 ENCOUNTER — Other Ambulatory Visit: Payer: Self-pay

## 2021-07-25 ENCOUNTER — Other Ambulatory Visit: Payer: Self-pay | Admitting: Physical Medicine and Rehabilitation

## 2021-07-25 MED ORDER — BACLOFEN 20 MG PO TABS
20.0000 mg | ORAL_TABLET | Freq: Three times a day (TID) | ORAL | 1 refills | Status: DC
  Filled 2021-07-25: qty 90, 30d supply, fill #0

## 2021-08-01 ENCOUNTER — Other Ambulatory Visit: Payer: Self-pay

## 2021-08-13 ENCOUNTER — Ambulatory Visit: Payer: Medicaid Other | Admitting: Physical Medicine and Rehabilitation

## 2021-08-23 ENCOUNTER — Encounter: Payer: Self-pay | Admitting: Internal Medicine

## 2021-08-23 ENCOUNTER — Other Ambulatory Visit: Payer: Self-pay

## 2021-08-23 ENCOUNTER — Ambulatory Visit: Payer: Medicaid Other | Attending: Internal Medicine | Admitting: Internal Medicine

## 2021-08-23 VITALS — BP 110/76 | HR 76 | Resp 16 | Ht 68.5 in | Wt 118.2 lb

## 2021-08-23 DIAGNOSIS — Z1231 Encounter for screening mammogram for malignant neoplasm of breast: Secondary | ICD-10-CM

## 2021-08-23 DIAGNOSIS — R159 Full incontinence of feces: Secondary | ICD-10-CM

## 2021-08-23 DIAGNOSIS — R152 Fecal urgency: Secondary | ICD-10-CM

## 2021-08-23 DIAGNOSIS — Z0000 Encounter for general adult medical examination without abnormal findings: Secondary | ICD-10-CM

## 2021-08-23 DIAGNOSIS — R636 Underweight: Secondary | ICD-10-CM

## 2021-08-23 DIAGNOSIS — Z8659 Personal history of other mental and behavioral disorders: Secondary | ICD-10-CM

## 2021-08-23 DIAGNOSIS — G903 Multi-system degeneration of the autonomic nervous system: Secondary | ICD-10-CM | POA: Insufficient documentation

## 2021-08-23 DIAGNOSIS — N319 Neuromuscular dysfunction of bladder, unspecified: Secondary | ICD-10-CM

## 2021-08-23 DIAGNOSIS — H5200 Hypermetropia, unspecified eye: Secondary | ICD-10-CM

## 2021-08-23 DIAGNOSIS — Z0001 Encounter for general adult medical examination with abnormal findings: Secondary | ICD-10-CM

## 2021-08-23 DIAGNOSIS — H6123 Impacted cerumen, bilateral: Secondary | ICD-10-CM

## 2021-08-23 DIAGNOSIS — Z23 Encounter for immunization: Secondary | ICD-10-CM

## 2021-08-23 MED ORDER — PAROXETINE HCL 10 MG PO TABS
ORAL_TABLET | ORAL | 0 refills | Status: DC
Start: 2021-08-23 — End: 2022-08-22
  Filled 2021-08-23: qty 30, 30d supply, fill #0

## 2021-08-23 NOTE — Progress Notes (Signed)
Patient ID: Anna Campos, female    DOB: 1966/02/20  MRN: [id number]  CC: Annual Exam   Subjective: Anna Campos is a 55 y.o. female who presents for annual exam Her concerns today include:  Patient with history of incomplete quadriplegia at C5-6 level, neurogenic bowel, neurogenic bladder, nerve pain, anemia, hypotension, gastric ulcers, PE in the setting of acute illness, HL, genital herpes.   HM:  Due for MMG, COVID booster, flu and Pneumonia vaccines.   Due for shingles vaccine.  Depression:  pt reports she feels a lot better.She was depressed about her health situation but "I take it in stride day by day."  She took Paxil regularly.  Stopped it 3 days ago.  Denies any feeling of dizziness or shaking having not taken it in 3 days.  History of incomplete quadriplegia: She has been off Duloxetine for a while.  She was on it in past for never pain.  Pt reports she does not have as much pain as she use to.  Now just has a little stiffness in her fingers and when she walks sometimes.  No longer on Robaxin or Dantrolene.  On Baclofen instead. She stopped the Florinef 3 mths ago.  BP has been holding good without it.  No dizziness.  Larey Seat once last yr.  No falls this yr.  Ambulates with 4 prong cane On Vesicare 5 mg for her bladder.  She finds this helpful. Having some incontinence of bowels.  Reports when she has urge to have BM she has to go right away because she is unable to hold it.  No diarrhea Has appt with PMR Dr. Berline Chough later this mth  Reports appetite not as good as she would like but forces herself to eat BF, lunch and dinner.  Just started cooking again.  Gained 5 lbs since 11/2020.    Patient Active Problem List   Diagnosis Date Noted   Neurogenic orthostatic hypotension (HCC) 08/23/2021   Chronic pain syndrome 12/01/2020   Mixed hyperlipidemia 10/11/2020   Gastric ulcer 10/11/2020   Spasticity 02/16/2020   Nerve pain 02/16/2020   Impaired gait 02/16/2020   Pressure  injury of skin 01/15/2020   Transaminitis    Hyponatremia    Anemia    Neurogenic bowel 01/11/2020   Neurogenic bladder 01/11/2020   Malnutrition of moderate degree 01/01/2020   Poor dentition    Spinal stenosis of cervical region    Incomplete quadriplegia at C5-6 level [facility])    Hypotension    Bacteremia    Syncope 12/19/2019     Current Outpatient Medications on File Prior to Visit  Medication Sig Dispense Refill   acetaminophen (TYLENOL) 325 MG tablet Take 2 tablets (650 mg total) by mouth every 4 (four) hours as needed for mild pain (temp > 101.5).     baclofen (LIORESAL) 20 MG tablet Take 1 tablet (20 mg total) by mouth 3 (three) times daily. 90 each 1   loratadine (CLARITIN) 10 MG tablet TAKE 1 TABLET (10 MG TOTAL) BY MOUTH DAILY. 30 tablet 2   metoCLOPramide (REGLAN) 10 MG tablet Take 1 tablet (10 mg total) by mouth every 6 (six) hours. 15 tablet 0   Multiple Vitamin (MULTIVITAMIN WITH MINERALS) TABS tablet Take 1 tablet by mouth daily.     solifenacin (VESICARE) 5 MG tablet TAKE 1 TABLET (5 MG TOTAL) BY MOUTH DAILY. 30 tablet 5   No current facility-administered medications on file prior to visit.    Allergies  Allergen Reactions  Ampicillin Diarrhea   Chlorhexidine     Social History   Socioeconomic History   Marital status: Married    Spouse name: Not on file   Number of children: Not on file   Years of education: Not on file   Highest education level: Not on file  Occupational History   Not on file  Tobacco Use   Smoking status: Former   Smokeless tobacco: Never  Vaping Use   Vaping Use: Never used  Substance and Sexual Activity   Alcohol use: Yes    Comment: OCCASIONAL   Drug use: Not Currently   Sexual activity: Not on file  Other Topics Concern   Not on file  Social History Narrative   Not on file   Social Determinants of Health   Financial Resource Strain: Not on file  Food Insecurity: Not on file  Transportation Needs: Not on file   Physical Activity: Not on file  Stress: Not on file  Social Connections: Not on file  Intimate Partner Violence: Not on file    Family History  Problem Relation Age of Onset   Breast cancer Mother     Past Surgical History:  Procedure Laterality Date   ANTERIOR CERVICAL DECOMPRESSION/DISCECTOMY FUSION 4 LEVELS N/A 01/04/2020   Procedure: CERVICAL THREE- FOUR, CERVICAL FOUR-FIVE, CERVICAL FIVE- SIX, CERVCAL SIX- SEVEN ANTERIOR CERVICAL DECOMPRESSION/DISCECTOMY FUSION;  Surgeon: Julio Sicks, MD;  Location: [facility] OR;  Service: Neurosurgery;  Laterality: N/A;  CERVICAL THREE- FOUR, CERVICAL FOUR-FIVE, CERVICAL FIVE- SIX, CERVCAL SIX- SEVEN ANTERIOR CERVICAL DECOMPRESSION/DISCECTOMY FUSION   CESAREAN SECTION     ESOPHAGOGASTRODUODENOSCOPY (EGD) WITH PROPOFOL N/A 12/27/2019   Procedure: ESOPHAGOGASTRODUODENOSCOPY (EGD) WITH PROPOFOL;  Surgeon: Kerin Salen, MD;  Location: [facility] ENDOSCOPY;  Service: Gastroenterology;  Laterality: N/A;  PLEASE ALERT ANESTHESIA TO THE FACT THAT THE PATIENT HAS SUSTAINED A NECK INJURY AND HAS A QUADRIPARESIS   HEMOSTASIS CLIP PLACEMENT  12/27/2019   Procedure: HEMOSTASIS CLIP PLACEMENT;  Surgeon: Kerin Salen, MD;  Location: [facility] ENDOSCOPY;  Service: Gastroenterology;;   MULTIPLE EXTRACTIONS WITH ALVEOLOPLASTY N/A 12/30/2019   Procedure: Extraction of tooth #'s 2-8, 11-14, and 20-28 with alveoloplasty and bilateral mandibular lingual tori reductions.;  Surgeon: Charlynne Pander, DDS;  Location: [facility] OR;  Service: Oral Surgery;  Laterality: N/A;    ROS: Review of Systems  Constitutional:        No dizziness.  HENT:  Negative for hearing loss and sore throat.   Eyes:  Positive for visual disturbance (has to wear OTC readers.  LAst eye exam has been yrs).  Respiratory:  Negative for cough and shortness of breath.   Cardiovascular:  Negative for chest pain and palpitations.  Gastrointestinal:  Negative for abdominal pain.       No problems swallowing  Genitourinary:        No  longer has menses.  Does get hot flashes.  Negative except as stated above  PHYSICAL EXAM: BP 110/76   Pulse 76   Resp 16   Ht 5' 8.5" (1.74 m)   Wt 118 lb 3.2 oz (53.6 kg)   SpO2 97%   BMI 17.71 kg/m   Wt Readings from Last 3 Encounters:  08/23/21 118 lb 3.2 oz (53.6 kg)  12/01/20 113 lb 6.4 oz (51.4 kg)  09/01/20 113 lb 12.8 oz (51.6 kg)    Physical Exam  General appearance -patient is alert middle-aged African-American female.  She has noted muscle wasting in the upper body especially around the neck and shoulder girdle.  She answers questions appropriately Mental status - normal mood, behavior, speech, dress, motor activity, and thought processes Eyes - pupils equal and reactive, extraocular eye movements intact Ears -both ear canals are small.  She has small to moderate amount of hard wax buildup left greater than right Nose - normal and patent, no erythema, discharge or polyps Mouth -oral mucosa is moist.  Throat is clear.  She has dentures above but is edentulous below. Neck - supple, no significant adenopathy Lymphatics - no palpable lymphadenopathy, no hepatosplenomegaly Chest - clear to auscultation, no wheezes, rales or rhonchi, symmetric air entry Heart - normal rate, regular rhythm, normal S1, S2, no murmurs, rubs, clicks or gallops Abdomen - soft, nontender, nondistended, no masses or organomegaly.  She has a palpable aorta in the epigastric region midline. Neurological -cranial nerves grossly intact.  Grip 5/5 bilaterally.  Power in the upper extremities 5/5 proximally and distally.  Power left left lower extremity 5/5 proximally and distally.  Power in the right lower extremity 4/5 proximally and distally.  Gross sensation intact.  Gait is stiff with minimal bending noted at the right knee and she drags the right leg. Musculoskeletal -wasting of muscles around the shoulder girdle and in the forearms right greater than left.  She has contractures of the fingers DIP  joints right hand. Extremities - peripheral pulses normal, no pedal edema, no clubbing or cyanosis  Depression screen [facility] 2/9 08/23/2021 12/01/2020 09/01/2020  Decreased Interest 3 1 1   Down, Depressed, Hopeless 0 1 1  PHQ - 2 Score 3 2 2   Altered sleeping 1 - -  Tired, decreased energy 3 - -  Change in appetite 0 - -  Feeling bad or failure about yourself  0 - -  Trouble concentrating 1 - -  Moving slowly or fidgety/restless 0 - -  Suicidal thoughts 0 - -  PHQ-9 Score 8 - -    CMP Latest Ref Rng & Units 12/30/2020 08/18/2020 02/01/2020  Glucose 70 - 99 mg/dL 96 77 90  BUN 6 - 20 mg/dL 25(H) 12 16  Creatinine 0.44 - 1.00 mg/dL 0.79 0.78 0.74  Sodium 135 - 145 mmol/L 141 142 139  Potassium 3.5 - 5.1 mmol/L 4.3 4.1 4.1  Chloride 98 - 111 mmol/L 103 104 102  CO2 22 - 32 mmol/L 26 26 29   Calcium 8.9 - 10.3 mg/dL 10.3 9.6 9.6  Total Protein 6.5 - 8.1 g/dL 8.0 6.7 -  Total Bilirubin 0.3 - 1.2 mg/dL 0.8 0.4 -  Alkaline Phos 38 - 126 U/L 96 114 -  AST 15 - 41 U/L 20 24 -  ALT 0 - 44 U/L 11 13 -   Lipid Panel  No results found for: CHOL, TRIG, HDL, CHOLHDL, VLDL, LDLCALC, LDLDIRECT  CBC    Component Value Date/Time   WBC 5.8 12/30/2020 2015   RBC 4.71 12/30/2020 2015   HGB 13.9 12/30/2020 2015   HGB 11.8 08/18/2020 1520   HCT 42.3 12/30/2020 2015   HCT 36.7 08/18/2020 1520   PLT 276 12/30/2020 2015   PLT 291 08/18/2020 1520   MCV 89.8 12/30/2020 2015   MCV 91 08/18/2020 1520   MCH 29.5 12/30/2020 2015   MCHC 32.9 12/30/2020 2015   RDW 12.0 12/30/2020 2015   RDW 11.7 08/18/2020 1520   LYMPHSABS 2.0 12/30/2020 2015   MONOABS 0.4 12/30/2020 2015   EOSABS 0.1 12/30/2020 2015   BASOSABS 0.0 12/30/2020 2015    ASSESSMENT AND PLAN: 1. Annual physical exam -Patient  will be given mammogram scholarship. Encouraged her to get an eye exam at least once every 2 years.  Given the name of places where she can get an eye exam at a reasonable cost.   2. Impacted cerumen of both  ears Recommended purchasing and using some wax softener over-the-counter.  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  3. Mildly underweight adult She has gained some weight since last visit but remains underweight based on her BMI.  Encouraged her to drink Ensure or boost to help supplement her meals.  She may do better eating smaller more frequent meals rather than trying to eat 3 large meals a day.  4. Incontinence of feces with fecal urgency 5. Neurogenic bladder Neither of these issues are new.  These have been occurring since her fall.  She is on Vesicare for the bladder.  She wears pads for the incontinence.  6. History of depression Advised patient that it is best to taper off Paxil rather than stop it abruptly to prevent symptoms from abrupt discontinuation and rebound depression.  She is agreeable to this.  I have sent a prescription to the pharmacy outlining a tapering course. - PARoxetine (PAXIL) 10 MG tablet; Take 1 tab by mouth daily for  2 weeks, then take 1/2 tab daily for 2 weeks, then 1 tab every other day until done  Dispense: 30 tablet; Refill: 0  7. Farsightedness, unspecified laterality See #1 above  8. Encounter for screening mammogram for malignant neoplasm of breast - MM Digital Screening; Future  9. Need for influenza vaccination Given today.  10. Need for vaccination against Streptococcus pneumoniae Given today.  Patient was given the opportunity to ask questions.  Patient verbalized understanding of the plan and was able to repeat key elements of the plan.   Orders Placed This Encounter  Procedures   MM Digital Screening   Flu Vaccine QUAD 58mo+IM (Fluarix, Fluzone & Alfiuria Quad PF)     Requested Prescriptions   Signed Prescriptions Disp Refills   PARoxetine (PAXIL) 10 MG tablet 30 tablet 0    Sig: Take 1 tab by mouth daily for  2 weeks, then take 1/2 tab daily for 2 weeks, then 1 tab every other day until done     Return in about 6 months (around 02/20/2022).  Karle Plumber, MD, FACP

## 2021-08-23 NOTE — Patient Instructions (Signed)
You should get an eye exam done at least once every 2 years.  You should purchase some boost or Ensure and drink 1 can as needed to help supplement your meals.  Try to eat smaller more frequent meals instead of trying to eat 3 large meals a day.  We need to taper the paroxetine rather than you stopping it abruptly.  I have sent the prescription to the pharmacy for the tapering regimen.

## 2021-09-12 ENCOUNTER — Other Ambulatory Visit: Payer: Self-pay

## 2021-09-12 ENCOUNTER — Encounter: Payer: Self-pay | Admitting: Physical Medicine and Rehabilitation

## 2021-09-12 ENCOUNTER — Encounter: Payer: Self-pay | Attending: Physical Medicine and Rehabilitation | Admitting: Physical Medicine and Rehabilitation

## 2021-09-12 VITALS — BP 106/71 | HR 87 | Ht 68.5 in | Wt 118.6 lb

## 2021-09-12 DIAGNOSIS — G8254 Quadriplegia, C5-C7 incomplete: Secondary | ICD-10-CM | POA: Insufficient documentation

## 2021-09-12 DIAGNOSIS — M792 Neuralgia and neuritis, unspecified: Secondary | ICD-10-CM | POA: Insufficient documentation

## 2021-09-12 DIAGNOSIS — R252 Cramp and spasm: Secondary | ICD-10-CM | POA: Insufficient documentation

## 2021-09-12 DIAGNOSIS — M25641 Stiffness of right hand, not elsewhere classified: Secondary | ICD-10-CM | POA: Insufficient documentation

## 2021-09-12 MED ORDER — DANTROLENE SODIUM 25 MG PO CAPS
25.0000 mg | ORAL_CAPSULE | Freq: Every day | ORAL | 5 refills | Status: DC
  Filled 2021-09-12: qty 120, 38d supply, fill #0
  Filled 2022-06-03: qty 60, 30d supply, fill #0

## 2021-09-12 MED ORDER — BACLOFEN 20 MG PO TABS
20.0000 mg | ORAL_TABLET | Freq: Four times a day (QID) | ORAL | 5 refills | Status: DC
  Filled 2021-09-12: qty 90, 23d supply, fill #0

## 2021-09-12 MED ORDER — BACLOFEN 20 MG PO TABS
20.0000 mg | ORAL_TABLET | Freq: Four times a day (QID) | ORAL | 5 refills | Status: DC
  Filled 2021-09-12 – 2022-02-04 (×2): qty 120, 30d supply, fill #0
  Filled 2022-06-03: qty 120, 30d supply, fill #1

## 2021-09-12 NOTE — Patient Instructions (Signed)
Patient is a 55 yr old female with incomplete Quadriplegia- C5 ASIA D due to osteomyelitis  with neurogenic bowel and bladder and spasticity as well as chronic nerve pain. Also has some depression due to her hx as well as orthostatic hypotension.  February 2021. Constipation- only goes 2x/week at best.    Here for f/u on SCI.   Restart Dantrolene at a lower dose- 25 mg nightly x 1 week, then if need be, 50 mg nightly x 1 week, and if still tight/stiff, increase to 50 mg 2x/day- for spasticity. #120 5 refills.   2. Change Baclofen  20 mg in AM, 20 mg- at lunch and 40 mg at night- so 4 pills/day but 3x/day total- for spasticity- 120 pills 5 refills.    3. Refer to Dr Wynn Banker or Dr Carlis Abbott for Botox of R hand- since losing ROM of hand-RUE_ max 300 units.   4. If restart Dantrolene- will need labs draw in 4-6 weeks.  I will order, and you go to [facility] in ~ 1 month   5. We discussed at length I don't think will cause liver issues, then we stop it- I don't think it will on the dose she will be taking.    6. F/U in 3 months- double appt- SCI

## 2021-09-12 NOTE — Progress Notes (Signed)
Subjective:    Patient ID: Anna Campos, female    DOB: 25-Dec-1965, 54 y.o.   MRN: [id number]  HPI  Patient is a 55 yr old female with incomplete Quadriplegia- C5 ASIA D due to osteomyelitis  with neurogenic bowel and bladder and spasticity as well as chronic nerve pain. Also has some depression due to her hx as well as orthostatic hypotension. Marland Kitchen   Here for f/u on SCI.    Tried Dantrolene- made her so relaxed- had bathroom accidents- but made her too loose.   No other side effects from Dantrolene. .   Bowel- going 1-2x/week- is going.  Bladder- still on Vesicare- still needs- occ bladder accidents- when goes to bed at night- leaks more than anything- wears depends/pad to help.   Pain- stiffness really bad- real stiff, but not like I just cut her leg off. Has been taking Baclofen 20 mg TID-    Not dizzy in the morning when wakes up anymore- so stopped the Florinef- since didn't need anymore.    Stopped Tramadol- just using Tylenol for pain- so doesn't need Tramadol anymore.   Getting Paxil from PCP- but things doing OK, so back down to 10 mg daily   No other issues.  Daughter in college now.      Pain Inventory Average Pain 10 Pain Right Now 10 My pain is stiffness  In the last 24 hours, has pain interfered with the following? General activity 5 Relation with others 5 Enjoyment of life 8 What TIME of day is your pain at its worst? daytime and evening Sleep (in general) Fair  Pain is worse with: walking Pain improves with: heat/ice and medication Relief from Meds: 8  Family History  Problem Relation Age of Onset   Breast cancer Mother    Social History   Socioeconomic History   Marital status: Married    Spouse name: Not on file   Number of children: Not on file   Years of education: Not on file   Highest education level: Not on file  Occupational History   Not on file  Tobacco Use   Smoking status: Former   Smokeless tobacco: Never  Vaping Use    Vaping Use: Never used  Substance and Sexual Activity   Alcohol use: Yes    Comment: OCCASIONAL   Drug use: Not Currently   Sexual activity: Not on file  Other Topics Concern   Not on file  Social History Narrative   Not on file   Social Determinants of Health   Financial Resource Strain: Not on file  Food Insecurity: Not on file  Transportation Needs: Not on file  Physical Activity: Not on file  Stress: Not on file  Social Connections: Not on file   Past Surgical History:  Procedure Laterality Date   ANTERIOR CERVICAL DECOMPRESSION/DISCECTOMY FUSION 4 LEVELS N/A 01/04/2020   Procedure: CERVICAL THREE- FOUR, CERVICAL FOUR-FIVE, CERVICAL FIVE- SIX, CERVCAL SIX- SEVEN ANTERIOR CERVICAL DECOMPRESSION/DISCECTOMY FUSION;  Surgeon: Julio Sicks, MD;  Location: [facility] OR;  Service: Neurosurgery;  Laterality: N/A;  CERVICAL THREE- FOUR, CERVICAL FOUR-FIVE, CERVICAL FIVE- SIX, CERVCAL SIX- SEVEN ANTERIOR CERVICAL DECOMPRESSION/DISCECTOMY FUSION   CESAREAN SECTION     ESOPHAGOGASTRODUODENOSCOPY (EGD) WITH PROPOFOL N/A 12/27/2019   Procedure: ESOPHAGOGASTRODUODENOSCOPY (EGD) WITH PROPOFOL;  Surgeon: Kerin Salen, MD;  Location: [facility] ENDOSCOPY;  Service: Gastroenterology;  Laterality: N/A;  PLEASE ALERT ANESTHESIA TO THE FACT THAT THE PATIENT HAS SUSTAINED A NECK INJURY AND HAS A QUADRIPARESIS   HEMOSTASIS CLIP  PLACEMENT  12/27/2019   Procedure: HEMOSTASIS CLIP PLACEMENT;  Surgeon: Kerin Salen, MD;  Location: [facility] ENDOSCOPY;  Service: Gastroenterology;;   MULTIPLE EXTRACTIONS WITH ALVEOLOPLASTY N/A 12/30/2019   Procedure: Extraction of tooth #'s 2-8, 11-14, and 20-28 with alveoloplasty and bilateral mandibular lingual tori reductions.;  Surgeon: Charlynne Pander, DDS;  Location: [facility] OR;  Service: Oral Surgery;  Laterality: N/A;   Past Surgical History:  Procedure Laterality Date   ANTERIOR CERVICAL DECOMPRESSION/DISCECTOMY FUSION 4 LEVELS N/A 01/04/2020   Procedure: CERVICAL THREE- FOUR, CERVICAL FOUR-FIVE,  CERVICAL FIVE- SIX, CERVCAL SIX- SEVEN ANTERIOR CERVICAL DECOMPRESSION/DISCECTOMY FUSION;  Surgeon: Julio Sicks, MD;  Location: [facility] OR;  Service: Neurosurgery;  Laterality: N/A;  CERVICAL THREE- FOUR, CERVICAL FOUR-FIVE, CERVICAL FIVE- SIX, CERVCAL SIX- SEVEN ANTERIOR CERVICAL DECOMPRESSION/DISCECTOMY FUSION   CESAREAN SECTION     ESOPHAGOGASTRODUODENOSCOPY (EGD) WITH PROPOFOL N/A 12/27/2019   Procedure: ESOPHAGOGASTRODUODENOSCOPY (EGD) WITH PROPOFOL;  Surgeon: Kerin Salen, MD;  Location: [facility] ENDOSCOPY;  Service: Gastroenterology;  Laterality: N/A;  PLEASE ALERT ANESTHESIA TO THE FACT THAT THE PATIENT HAS SUSTAINED A NECK INJURY AND HAS A QUADRIPARESIS   HEMOSTASIS CLIP PLACEMENT  12/27/2019   Procedure: HEMOSTASIS CLIP PLACEMENT;  Surgeon: Kerin Salen, MD;  Location: [facility] ENDOSCOPY;  Service: Gastroenterology;;   MULTIPLE EXTRACTIONS WITH ALVEOLOPLASTY N/A 12/30/2019   Procedure: Extraction of tooth #'s 2-8, 11-14, and 20-28 with alveoloplasty and bilateral mandibular lingual tori reductions.;  Surgeon: Charlynne Pander, DDS;  Location: [facility] OR;  Service: Oral Surgery;  Laterality: N/A;   Past Medical History:  Diagnosis Date   Genital herpes    PE (pulmonary thromboembolism) (HCC) 12/20/2019   Syncope 12/20/2019   BP 106/71   Pulse 87   Ht 5' 8.5" (1.74 m)   Wt 118 lb 9.6 oz (53.8 kg)   SpO2 98%   BMI 17.77 kg/m   Opioid Risk Score:   Fall Risk Score:  `1  Depression screen PHQ 2/9  Depression screen [facility] 2/9 09/12/2021 08/23/2021 12/01/2020 09/01/2020 08/18/2020 02/16/2020  Decreased Interest 0 3 1 1 2  0  Down, Depressed, Hopeless 0 0 1 1 0 0  PHQ - 2 Score 0 3 2 2 2  0  Altered sleeping - 1 - - 1 1  Tired, decreased energy - 3 - - 1 1  Change in appetite - 0 - - 2 0  Feeling bad or failure about yourself  - 0 - - 1 2  Trouble concentrating - 1 - - 0 0  Moving slowly or fidgety/restless - 0 - - 3 0  Suicidal thoughts - 0 - - 0 0  PHQ-9 Score - 8 - - 10 4     Review of Systems   Constitutional: Negative.   HENT: Negative.    Eyes: Negative.   Respiratory: Negative.    Cardiovascular: Negative.   Gastrointestinal: Negative.   Endocrine: Negative.   Genitourinary: Negative.   Musculoskeletal: Negative.   Skin: Negative.   Allergic/Immunologic: Negative.   Neurological:  Positive for weakness and numbness.  Hematological: Negative.   Psychiatric/Behavioral: Negative.        Objective:   Physical Exam  Awake, alert, appropriate, walking with quad cane; accompanied by daughter, NAD  MS: curling of fingers on R hand- worst in 5th digit- more at PIPs and DIPs, than MCPs.  Atrophy in R hand- esp 1st dorsal interossei RUE- biceps 5/5; WE 4+/5; triceps 4+/5; grip 4-/5 and FA 2-/5 LUE- biceps 5/5; WE 4+/5; Triceps 4+/5; grip 4/5; and FA 2-/5 RLE-  HF 5/5; KE 5-/5; DF 4+/5 and PF 5-/5 LLE- HF 5-/5; KE 5-/5 DF 4+/5 and PF 5-/5  Neuro: Hoffman's on RUE, not LUE Clonus on RLE 4-5 beats MAS of 2 in RUE/RLE; trace to MAS of 1 in LUE/LLE       Assessment & Plan:   Patient is a 55 yr old female with incomplete Quadriplegia- C5 ASIA D due to osteomyelitis  with neurogenic bowel and bladder and spasticity as well as chronic nerve pain. Also has some depression due to her hx as well as orthostatic hypotension.  February 2021. Constipation- only goes 2x/week at best.    Here for f/u on SCI.   Restart Dantrolene at a lower dose- 25 mg nightly x 1 week, then if need be, 50 mg nightly x 1 week, and if still tight/stiff, increase to 50 mg 2x/day- for spasticity. #120 5 refills.   2. Change Baclofen  20 mg in AM, 20 mg- at lunch and 40 mg at night- so 4 pills/day but 3x/day total- for spasticity- 120 pills 5 refills.    3. Refer to Dr Wynn Banker or Dr Carlis Abbott for Botox of R hand- since losing ROM of hand-RUE_ max 300 units. Because of the curling of R hand mainly at [facility] and DIPS.   4. If restart Dantrolene- will need labs draw in 4-6 weeks.  I will order, and you go to  [facility] in ~ 1 month   5. We discussed at length I don't think will cause liver issues, then we stop it- I don't think it will on the dose she will be taking.    6. F/U in 3 months- double appt- SCI  I spent a total of 30 minutes on visit- discussing spasticity and Botox.

## 2021-09-17 ENCOUNTER — Other Ambulatory Visit: Payer: Self-pay

## 2021-09-18 ENCOUNTER — Telehealth: Payer: Self-pay | Admitting: Internal Medicine

## 2021-09-18 ENCOUNTER — Other Ambulatory Visit: Payer: Self-pay

## 2021-09-18 NOTE — Telephone Encounter (Signed)
Copied from CRM [id number]. Topic: General - Inquiry >> Sep 12, 2021  1:50 PM Wyonia Hough E wrote: Reason for CRM: Pt never heard from people about breast exam/ please advise pt of location and phone number

## 2021-09-19 NOTE — Telephone Encounter (Signed)
Returned pt call and lvm providing phone number for the b[facility]p program [phone] and if she has any questions or concerns to give [place] a call

## 2021-09-21 ENCOUNTER — Other Ambulatory Visit: Payer: Self-pay | Admitting: Obstetrics and Gynecology

## 2021-09-21 DIAGNOSIS — Z1231 Encounter for screening mammogram for malignant neoplasm of breast: Secondary | ICD-10-CM

## 2021-09-28 ENCOUNTER — Ambulatory Visit: Payer: Medicaid Other | Admitting: Physical Medicine and Rehabilitation

## 2021-10-23 ENCOUNTER — Other Ambulatory Visit: Payer: Self-pay

## 2021-10-23 ENCOUNTER — Ambulatory Visit
Admission: RE | Admit: 2021-10-23 | Discharge: 2021-10-23 | Disposition: A | Discharge status: Home / Self Care | Payer: No Typology Code available for payment source | Source: Ambulatory Visit | Attending: Obstetrics and Gynecology | Admitting: Obstetrics and Gynecology

## 2021-10-23 ENCOUNTER — Ambulatory Visit: Payer: Self-pay | Admitting: *Deleted

## 2021-10-23 VITALS — BP 98/68 | Wt 117.9 lb

## 2021-10-23 DIAGNOSIS — Z1231 Encounter for screening mammogram for malignant neoplasm of breast: Secondary | ICD-10-CM

## 2021-10-23 DIAGNOSIS — Z1239 Encounter for other screening for malignant neoplasm of breast: Secondary | ICD-10-CM

## 2021-10-23 NOTE — Progress Notes (Signed)
[name] is a 56 y.o. female who presents to [facility] clinic today with no complaints.    Pap Smear: Pap smear not completed today. Last Pap smear was 04/09/2019 at [facility] and was normal with negative HPV. Per patient has no history of an abnormal Pap smear. Last Pap smear result is available in Epic.   Physical exam: Breasts Breasts symmetrical. No skin abnormalities bilateral breasts. No nipple retraction bilateral breasts. No nipple discharge bilateral breasts. No lymphadenopathy. No lumps palpated bilateral breasts. No complaints of pain or tenderness on exam.     MM DIGITAL SCREENING BILATERAL  Result Date: 05/10/2019 CLINICAL DATA:  Screening. EXAM: DIGITAL SCREENING BILATERAL MAMMOGRAM WITH CAD COMPARISON:  Previous exam(s). ACR Breast Density Category c: The breast tissue is heterogeneously dense, which may obscure small masses. FINDINGS: In the right breast, a possible asymmetry warrants further evaluation. This possible asymmetry seen within the upper RIGHT breast, at middle depth, MLO view only. In the left breast, no findings suspicious for malignancy. Images were processed with CAD. IMPRESSION: Further evaluation is suggested for possible asymmetry in the right breast. RECOMMENDATION: Diagnostic mammogram and possibly ultrasound of the right breast. (Code:[id number]) The patient will be contacted regarding the findings, and additional imaging will be scheduled. BI-RADS CATEGORY  0: Incomplete. Need additional imaging evaluation and/or prior mammograms for comparison. Electronically Signed   By: Bary Richard M.D.   On: 05/10/2019 12:58   MM DIGITAL SCREENING BILATERAL  Result Date: 04/30/2018 CLINICAL DATA:  Screening. EXAM: DIGITAL SCREENING BILATERAL MAMMOGRAM WITH CAD COMPARISON:  Previous exam(s). ACR Breast Density Category c: The breast tissue is heterogeneously dense, which may obscure small masses. FINDINGS: There are no findings suspicious for  malignancy. Images were processed with CAD. IMPRESSION: No mammographic evidence of malignancy. A result letter of this screening mammogram will be mailed directly to the patient. RECOMMENDATION: Screening mammogram in one year. (Code:[id number]) BI-RADS CATEGORY  1: Negative. Electronically Signed   By: Amie Portland M.D.   On: 04/30/2018 11:37   MM DIGITAL SCREENING BILATERAL  Result Date: 03/13/2017 CLINICAL DATA:  Screening. EXAM: DIGITAL SCREENING BILATERAL MAMMOGRAM WITH CAD COMPARISON:  Previous exam(s). ACR Breast Density Category c: The breast tissue is heterogeneously dense, which may obscure small masses. FINDINGS: There are no findings suspicious for malignancy. Images were processed with CAD. IMPRESSION: No mammographic evidence of malignancy. A result letter of this screening mammogram will be mailed directly to the patient. RECOMMENDATION: Screening mammogram in one year. (Code:[id number]) BI-RADS CATEGORY  1: Negative. Electronically Signed   By: Bary Richard M.D.   On: 03/13/2017 10:30   MM DIAG BREAST TOMO UNI RIGHT  Result Date: 05/13/2019 CLINICAL DATA:  Possible asymmetry in the upper right breast on a recent 2D screening mammogram. EXAM: DIGITAL DIAGNOSTIC UNILATERAL RIGHT MAMMOGRAM WITH CAD AND TOMO COMPARISON:  Previous exam(s). ACR Breast Density Category c: The breast tissue is heterogeneously dense, which may obscure small masses. FINDINGS: 3D tomographic and 2D generated craniocaudal and oblique views of the right breast demonstrate normal appearing breast tissue at the location of recently suspected asymmetry. There are no findings in the breast suspicious for malignancy. Mammographic images were processed with CAD. IMPRESSION: No evidence of malignancy. The recently suspected right breast asymmetry was overlapping of normal breast tissue. RECOMMENDATION: Bilateral screening mammogram in 1 year. I have discussed the findings and recommendations with the patient. Results were also  provided in writing at the conclusion of the visit. If applicable, a reminder  letter will be sent to the patient regarding the next appointment. BI-RADS CATEGORY  1: Negative. Electronically Signed   By: Beckie Salts M.D.   On: 05/13/2019 14:45     Pelvic/Bimanual Pap is not indicated today per BCCCP guidelines.   Smoking History: Patient is a former that quit in 2021.   Patient Navigation: Patient education provided. Access to services provided for patient through [facility] program.   Colorectal Cancer Screening: Per patient has had colonoscopy completed on 05/11/2018 that was benign.  No complaints today.    Breast and Cervical Cancer Risk Assessment: Patient has family history of her mother having breast cancer. Patient has no known genetic mutations or history of radiation treatment to the chest before age 11. Patient does not have history of cervical dysplasia, immunocompromised, or DES exposure in-utero.  Risk Assessment     Risk Scores       10/23/2021   Last edited by: Narda Rutherford, LPN   5-year risk: 2.1 %   Lifetime risk: 12.5 %            A: BCCCP exam without pap smear No complaints.  P: Referred patient to the [facility] for a screening mammogram on mobile unit. Appointment scheduled Tuesday, October 23, 2021 at 1430.  Priscille Heidelberg, RN 10/23/2021 1:46 PM

## 2021-10-23 NOTE — Patient Instructions (Signed)
Explained breast self awareness with Anna Campos. Patient did not need a Pap smear today due to last Pap smear and HPV typing was 04/09/2019. Let her know BCCCP will cover Pap smears and HPV typing every 5 years unless has a history of abnormal Pap smears. Referred patient to the [facility] for a screening mammogram on mobile unit. Appointment scheduled Tuesday, October 23, 2021 at 1430. Patient escorted to the mobile unit following BCCCP appointment for her screening mammogram. Let patient know the Breast Center will follow up with her within the next couple weeks with results of her mammogram by letter or phone. Anna Campos verbalized understanding.  Aliha Diedrich, Kathaleen Maser, RN 1:46 PM

## 2021-11-15 ENCOUNTER — Ambulatory Visit: Payer: Medicaid Other | Admitting: Physical Medicine & Rehabilitation

## 2021-11-20 ENCOUNTER — Telehealth: Payer: Self-pay | Admitting: Internal Medicine

## 2021-11-20 NOTE — Telephone Encounter (Signed)
Anna Campos doesn't do disability paperwork. It will be better that pt see her pcp

## 2021-11-20 NOTE — Telephone Encounter (Signed)
Copied from CRM [id number]. Topic: General - Other >> Nov 20, 2021  2:21 PM Wyonia Hough E wrote: Pt Reason for CRM: Pt has to complete a form for disability to let them know she is still disabled/ pt has a disability appt on 2.22.23 with Marylene Land but needs the paperwork back before then/ pt is a bit confused about what she needs exactly / please call pt to advise   Pt had a CPE done in November and she needs a copy of her OV notes to pick up asap/ please advise

## 2021-12-12 ENCOUNTER — Other Ambulatory Visit: Payer: Self-pay

## 2021-12-12 ENCOUNTER — Ambulatory Visit: Payer: Self-pay | Admitting: Physician Assistant

## 2021-12-14 ENCOUNTER — Ambulatory Visit: Payer: No Typology Code available for payment source | Admitting: Internal Medicine

## 2021-12-17 ENCOUNTER — Encounter: Payer: Self-pay | Attending: Physical Medicine and Rehabilitation | Admitting: Physical Medicine and Rehabilitation

## 2021-12-17 ENCOUNTER — Encounter: Payer: Self-pay | Admitting: Physical Medicine and Rehabilitation

## 2021-12-17 ENCOUNTER — Other Ambulatory Visit: Payer: Self-pay

## 2021-12-17 VITALS — BP 103/71 | HR 87 | Ht 68.5 in | Wt 118.0 lb

## 2021-12-17 DIAGNOSIS — R252 Cramp and spasm: Secondary | ICD-10-CM | POA: Insufficient documentation

## 2021-12-17 DIAGNOSIS — G894 Chronic pain syndrome: Secondary | ICD-10-CM | POA: Insufficient documentation

## 2021-12-17 DIAGNOSIS — G903 Multi-system degeneration of the autonomic nervous system: Secondary | ICD-10-CM | POA: Insufficient documentation

## 2021-12-17 DIAGNOSIS — G8254 Quadriplegia, C5-C7 incomplete: Secondary | ICD-10-CM | POA: Insufficient documentation

## 2021-12-17 DIAGNOSIS — M21371 Foot drop, right foot: Secondary | ICD-10-CM | POA: Insufficient documentation

## 2021-12-17 DIAGNOSIS — R269 Unspecified abnormalities of gait and mobility: Secondary | ICD-10-CM | POA: Insufficient documentation

## 2021-12-17 NOTE — Progress Notes (Signed)
Subjective:    Patient ID: Anna Campos, female    DOB: Feb 27, 1966, 56 y.o.   MRN: [id number]  HPI Patient is a 56 yr old female with incomplete Quadriplegia- C5 ASIA D due to osteomyelitis  with neurogenic bowel and bladder and spasticity as well as chronic nerve pain. Also has some depression due to her hx as well as orthostatic hypotension.  February 2021. Also has constipation.   Here for f/u on SCI.    Wasn't able to get Botox due to lack of $2000. And doesn't have way to pay for Botox.   Disability is reviewing to see if still needs disability and if can go back to work.   Bladder- Still doesn't have control of bowels.  Still doing suppository but only when gets constipated.  Still having accidents-  Husband has to put in suppository.  And dig stim.  Having accidents when "eats too much"- but  Doesn't take milk products much.  3x/week minimum- sometimes 7 days/week.   Bladder-  can manage- having some accidents with pads.  When drinks a lot.  Is emptying when pees.    Spasticity- mainly her hands- and sometimes legs will jump-  Cannot take Dantrolene- never took it-  scared her to take it due to liver issues-  Baclofen - 20 mg 3x/day. Not taking 4th dose.      Pain Inventory Average Pain 7 Pain Right Now 7 My pain is aching and throbbing  LOCATION OF PAIN  left arm  BOWEL Number of stools per week: 3 Oral laxative use No  Type of laxative . Enema or suppository use Yes  History of colostomy No  Incontinent No   BLADDER Normal In and out cath, frequency na Able to self cath  na Bladder incontinence No  Frequent urination No  Leakage with coughing No  Difficulty starting stream No  Incomplete bladder emptying No    Mobility ability to climb steps?  yes do you drive?  yes  Function disabled: date disabled . I need assistance with the following:  bathing, meal prep, household duties, and shopping  Neuro/Psych weakness trouble  walking spasms anxiety  Prior Studies Any changes since last visit?  no  Physicians involved in your care Any changes since last visit?  no   Family History  Problem Relation Age of Onset   Breast cancer Mother    Social History   Socioeconomic History   Marital status: Married    Spouse name: Not on file   Number of children: 1   Years of education: Not on file   Highest education level: Some college, no degree  Occupational History   Not on file  Tobacco Use   Smoking status: Former   Smokeless tobacco: Never  Building services engineer Use: Never used  Substance and Sexual Activity   Alcohol use: Yes    Comment: OCCASIONAL   Drug use: Not Currently   Sexual activity: Yes    Birth control/protection: Post-menopausal  Other Topics Concern   Not on file  Social History Narrative   Not on file   Social Determinants of Health   Financial Resource Strain: Not on file  Food Insecurity: No Food Insecurity   Worried About Running Out of Food in the Last Year: Never true   Ran Out of Food in the Last Year: Never true  Transportation Needs: No Transportation Needs   Lack of Transportation (Medical): No   Lack of Transportation (Non-Medical): No  Physical Activity: Not on file  Stress: Not on file  Social Connections: Not on file   Past Surgical History:  Procedure Laterality Date   ANTERIOR CERVICAL DECOMPRESSION/DISCECTOMY FUSION 4 LEVELS N/A 01/04/2020   Procedure: CERVICAL THREE- FOUR, CERVICAL FOUR-FIVE, CERVICAL FIVE- SIX, CERVCAL SIX- SEVEN ANTERIOR CERVICAL DECOMPRESSION/DISCECTOMY FUSION;  Surgeon: Julio Sicks, MD;  Location: [facility] OR;  Service: Neurosurgery;  Laterality: N/A;  CERVICAL THREE- FOUR, CERVICAL FOUR-FIVE, CERVICAL FIVE- SIX, CERVCAL SIX- SEVEN ANTERIOR CERVICAL DECOMPRESSION/DISCECTOMY FUSION   CESAREAN SECTION     ESOPHAGOGASTRODUODENOSCOPY (EGD) WITH PROPOFOL N/A 12/27/2019   Procedure: ESOPHAGOGASTRODUODENOSCOPY (EGD) WITH PROPOFOL;  Surgeon: Kerin Salen, MD;  Location: [facility] ENDOSCOPY;  Service: Gastroenterology;  Laterality: N/A;  PLEASE ALERT ANESTHESIA TO THE FACT THAT THE PATIENT HAS SUSTAINED A NECK INJURY AND HAS A QUADRIPARESIS   HEMOSTASIS CLIP PLACEMENT  12/27/2019   Procedure: HEMOSTASIS CLIP PLACEMENT;  Surgeon: Kerin Salen, MD;  Location: [facility] ENDOSCOPY;  Service: Gastroenterology;;   MULTIPLE EXTRACTIONS WITH ALVEOLOPLASTY N/A 12/30/2019   Procedure: Extraction of tooth #'s 2-8, 11-14, and 20-28 with alveoloplasty and bilateral mandibular lingual tori reductions.;  Surgeon: Charlynne Pander, DDS;  Location: [facility] OR;  Service: Oral Surgery;  Laterality: N/A;   Past Medical History:  Diagnosis Date   Genital herpes    PE (pulmonary thromboembolism) (HCC) 12/20/2019   Syncope 12/20/2019   BP 103/71    Pulse 87    Ht 5' 8.5" (1.74 m)    Wt 118 lb (53.5 kg)    SpO2 98%    BMI 17.68 kg/m   Opioid Risk Score:   Fall Risk Score:  `1  Depression screen PHQ 2/9  Depression screen [facility] 2/9 09/12/2021 08/23/2021 12/01/2020 09/01/2020 08/18/2020 02/16/2020  Decreased Interest 0 3 1 1 2  0  Down, Depressed, Hopeless 0 0 1 1 0 0  PHQ - 2 Score 0 3 2 2 2  0  Altered sleeping - 1 - - 1 1  Tired, decreased energy - 3 - - 1 1  Change in appetite - 0 - - 2 0  Feeling bad or failure about yourself  - 0 - - 1 2  Trouble concentrating - 1 - - 0 0  Moving slowly or fidgety/restless - 0 - - 3 0  Suicidal thoughts - 0 - - 0 0  PHQ-9 Score - 8 - - 10 4     Review of Systems  Musculoskeletal:  Positive for gait problem.       Left arm pain  All other systems reviewed and are negative.     Objective:   Physical Exam  Awake, alert, appropriate, sitting on table; uses quad cane to get around- very limited- needs husband to walk more than 30 ft; NAD Hand function severely impaired  cannot open/close purse due to spasticity and clawing of hands.  Worse- difficult to open R hand in 2nd-5th digits on R and can open on L, but clawing as soon as does  ROM  MS: RUE- biceps 5/5; WE 4+/5; triceps 3-/5; grip 4-/5; FA 1/5 LUE- biceps 5/5; WE 5-/5; triceps 4-/5; grip 4-/5 FA 2-/5 RLE- HF 4/5; KE 4+/5; DF 2+/5; and PF 4-/5 (Not wearing AFO on R foot) LLE- HF 5-/5; KE 4+/5; DF and PF 4+/5  Neuro: L elbow MAS of 3-4 R hand MAS of 3 or more Hoffman's B/L  (+) MAS of 2 in LE's B/L      Assessment & Plan:  Patient is a 56 yr old  female with incomplete Quadriplegia- C5 ASIA D due to osteomyelitis  with neurogenic bowel and bladder and spasticity as well as chronic nerve pain. Also has some depression due to her hx as well as orthostatic hypotension- off Midodrine and Florinef- but BP still low! .  February 2021. Also has constipation and bowel accidents. Cannot work due to severe spasticity and incomplete quadriplegia.   Here for f/u on SCI.    Neurogenic bowel- Train gut to go the same time every day to every other day- has to be after a meal.  Do dig stim/ and suppository.   2. Spasticity- Baclofen take 20 mg in Am and afternoon and 40 mg QHS.    3. Orthostatic hypotension- BP is 103/71- too low for Zanaflex/Tizanidine- so Dantrolene is really our only other choice.    4. Spasticity- Try Dantrolene- usually- tolerated better than Baclofen- 25 mg QHS/nightly x 1 week; if you tolerate, then 50 mg nightly x 1 week- - if you need to increase more, can increase to 50 mg 2x/day- max dose I'll give you.  Esp to help with fingers spasticity.   5. If you actually take for 1 month, call me and I'll write for labs- CMP/LFTs.   6. R foot drop -Foot up brace online- for R foot- can get on [employer], etc. Will help you not drag R foot as much- esp when tired.   7. F/U 3 months- double appt- SCI- wait on Botox due to cost.   I spent a total of 34   minutes on total care today- >50% coordination of care- due to d/w pt about disability; spasticity, and bowel program- spent 15 minutes  on bowel program.

## 2021-12-17 NOTE — Patient Instructions (Signed)
Patient is a 56 yr old female with incomplete Quadriplegia- C5 ASIA D due to osteomyelitis  with neurogenic bowel and bladder and spasticity as well as chronic nerve pain. Also has some depression due to her hx as well as orthostatic hypotension- off Midodrine and Florinef- but BP still low! .  February 2021. Also has constipation and bowel accidents. Cannot work due to severe spasticity and incomplete quadriplegia.   Here for f/u on SCI.    Neurogenic bowel- Train gut to go the same time every day to every other day- has to be after a meal.  Do dig stim/ and suppository.   2. Spasticity- Baclofen take 20 mg in Am and afternoon and 40 mg QHS.    3. Orthostatic hypotension- BP is 103/71- too low for Zanaflex/Tizanidine- so Dantrolene is really our only other choice.    4. Spasticity- Try Dantrolene- usually- tolerated better than Baclofen- 25 mg QHS/nightly x 1 week; if you tolerate, then 50 mg nightly x 1 week- - if you need to increase more, can increase to 50 mg 2x/day- max dose I'll give you.  Esp to help with fingers spasticity.   5. If you actually take for 1 month, call me and I'll write for labs- CMP/LFTs.   6. R foot drop -Foot up brace online- for R foot- can get on [employer], etc. Will help you not drag R foot as much- esp when tired.   7. F/U 3 months- double appt- SCI- wait on Botox due to cost.

## 2022-02-04 ENCOUNTER — Other Ambulatory Visit: Payer: Self-pay

## 2022-02-21 ENCOUNTER — Ambulatory Visit: Payer: Medicaid Other | Admitting: Internal Medicine

## 2022-03-15 IMAGING — MG MM DIGITAL SCREENING BILAT W/ TOMO AND CAD
6 of 12 series · 6 of 36 positions shown · non-contrast
Comparison: Previous exam(s).

CLINICAL DATA: Screening.

EXAM:
DIGITAL SCREENING BILATERAL MAMMOGRAM WITH TOMOSYNTHESIS AND CAD
TECHNIQUE: Bilateral screening digital craniocaudal and mediolateral oblique
mammograms were obtained. Bilateral screening digital breast
tomosynthesis was performed. The images were evaluated with
computer-aided detection.

[L CC synth-2D (1 of 2)]
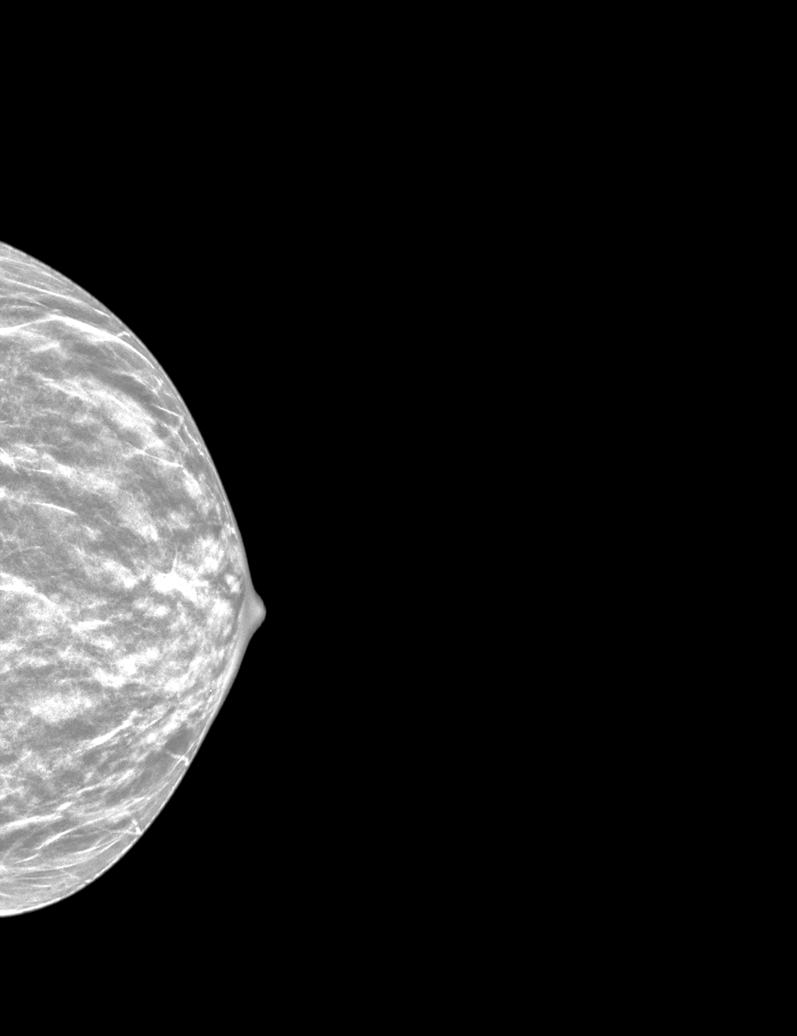

[R CC synth-2D (1 of 2)]
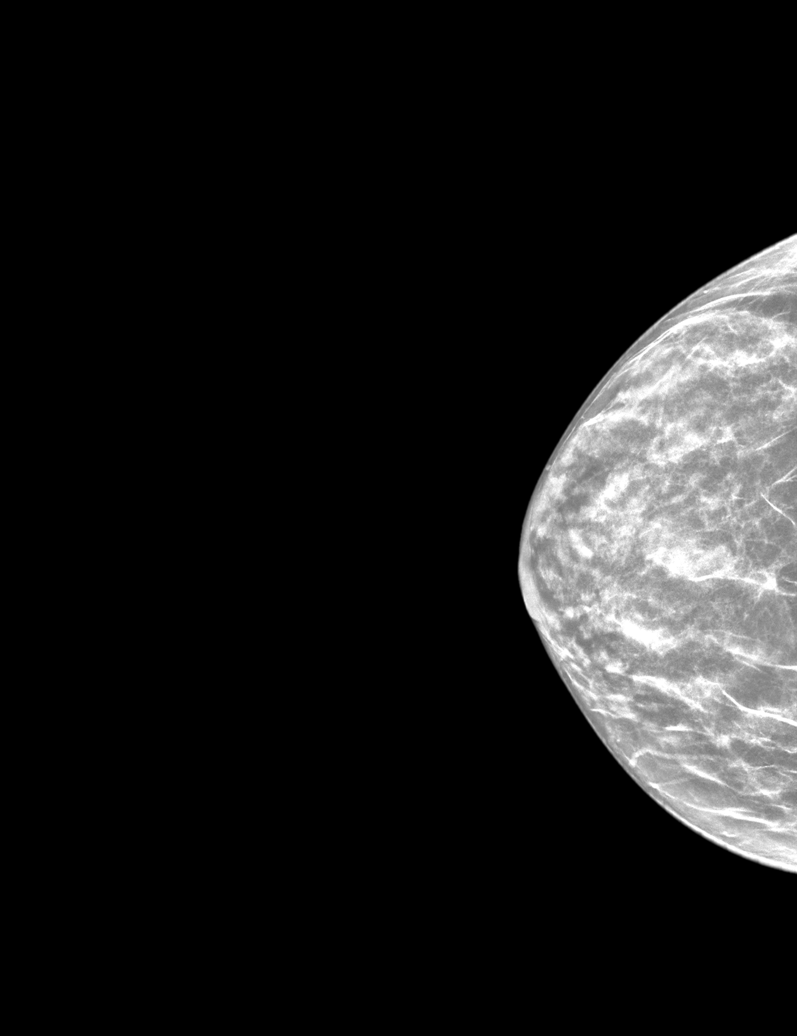

[R MLO synth-2D]
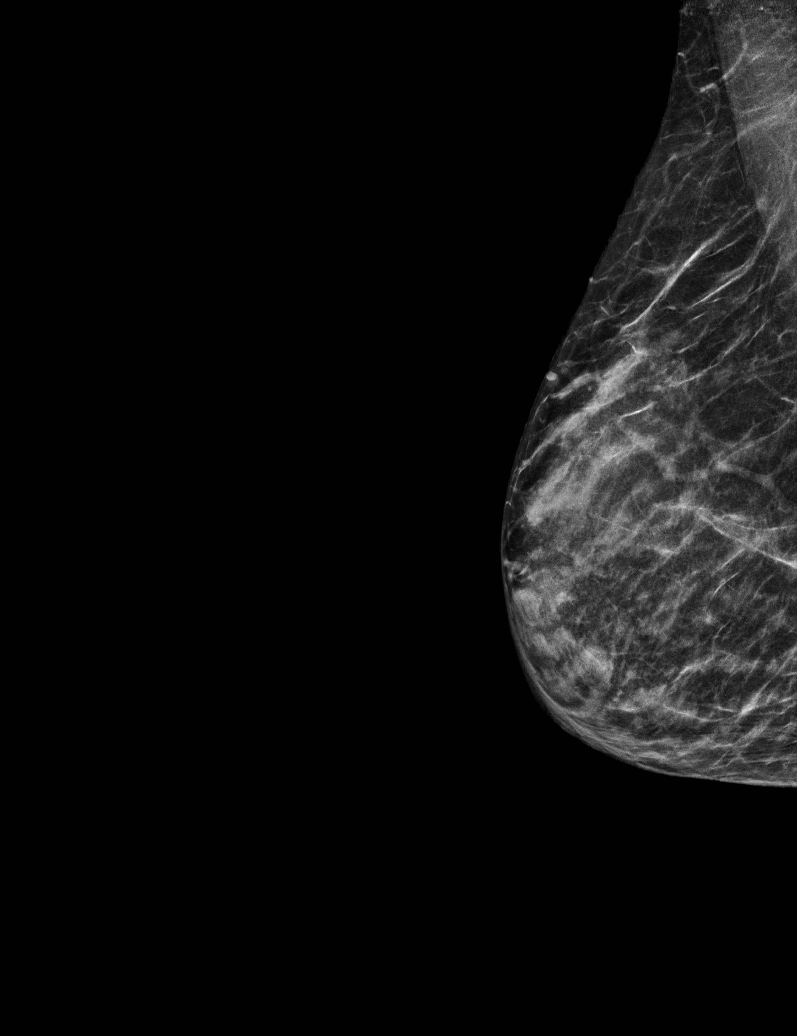

[R CC synth-2D (2 of 2)]
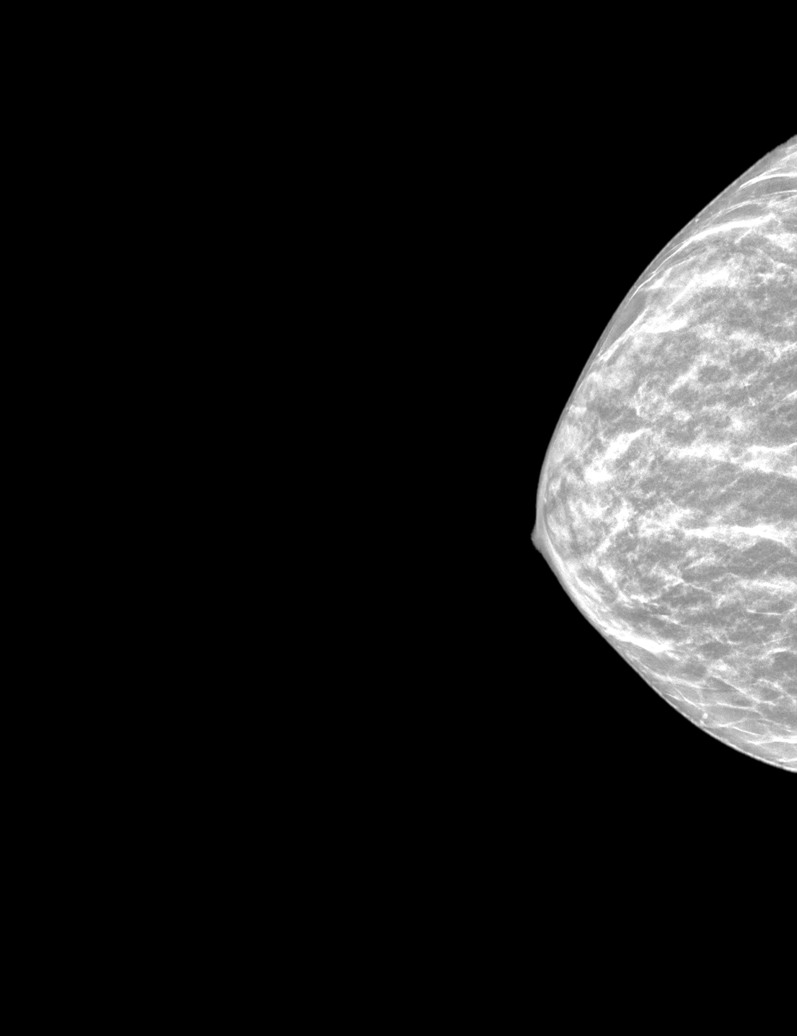

[L CC synth-2D (2 of 2)]
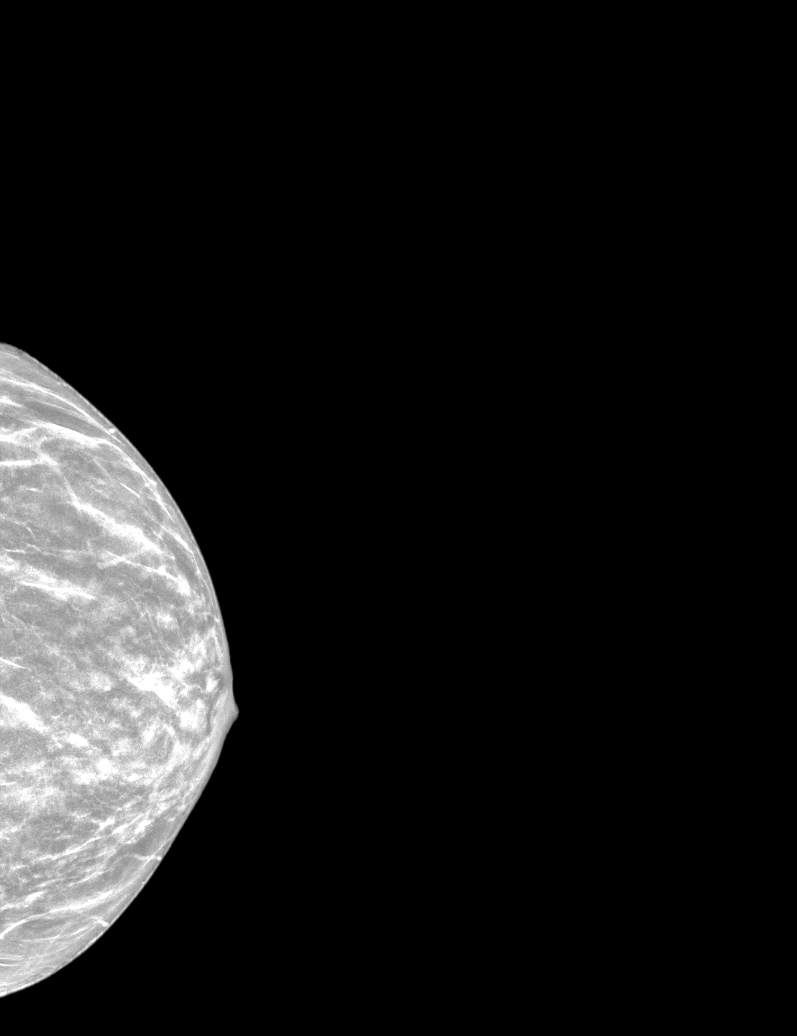

[L MLO synth-2D]
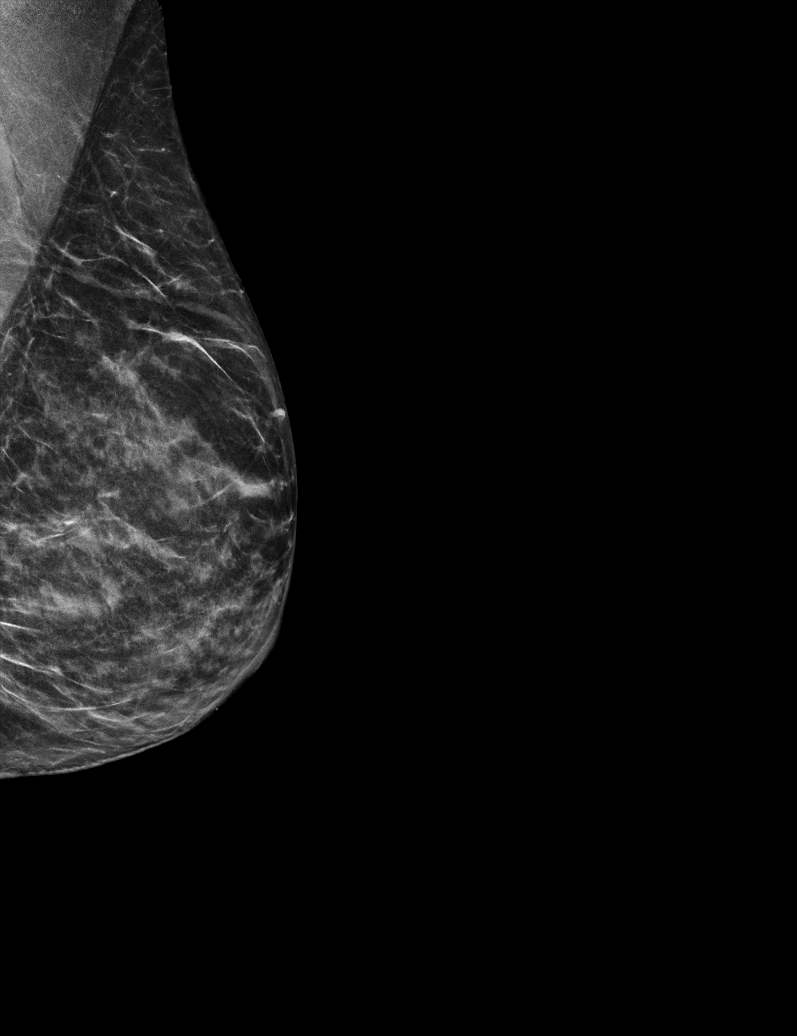

[6 of 36 positions shown; findings below may reference images not displayed]

ACR Breast Density Category c: The breast tissue is heterogeneously
dense, which may obscure small masses.
FINDINGS: There are no findings suspicious for malignancy.
IMPRESSION: No mammographic evidence of malignancy. A result letter of this
screening mammogram will be mailed directly to the patient.

RECOMMENDATION:
Screening mammogram in one year. (Code:[id number])

BI-RADS CATEGORY  1: Negative.

## 2022-04-01 ENCOUNTER — Encounter: Payer: Self-pay | Admitting: Physical Medicine and Rehabilitation

## 2022-04-01 ENCOUNTER — Encounter
Payer: No Typology Code available for payment source | Attending: Physical Medicine and Rehabilitation | Admitting: Physical Medicine and Rehabilitation

## 2022-04-01 VITALS — BP 100/68 | HR 78 | Ht 68.5 in | Wt 118.4 lb

## 2022-04-01 DIAGNOSIS — G903 Multi-system degeneration of the autonomic nervous system: Secondary | ICD-10-CM | POA: Insufficient documentation

## 2022-04-01 DIAGNOSIS — R252 Cramp and spasm: Secondary | ICD-10-CM | POA: Insufficient documentation

## 2022-04-01 DIAGNOSIS — G8254 Quadriplegia, C5-C7 incomplete: Secondary | ICD-10-CM | POA: Insufficient documentation

## 2022-04-01 DIAGNOSIS — M21371 Foot drop, right foot: Secondary | ICD-10-CM | POA: Insufficient documentation

## 2022-04-01 NOTE — Patient Instructions (Addendum)
Patient is a 55 yr old female with incomplete Quadriplegia- C5 ASIA D due to osteomyelitis  with neurogenic bowel and bladder and spasticity as well as chronic nerve pain. Also has some depression due to her hx as well as orthostatic hypotension- off Midodrine and Florinef- but BP still low! .  February 2021. Also has constipation and bowel accidents. Cannot work due to severe spasticity and incomplete quadriplegia.    Orthostatic hypotension- Cannot tolerate Zanaflex due to already low BP 100/68 today again. Dantrolene is our only choice  2. Since hasn't had ability to get spasticity better controlled, would best be treated with Dantrolene- although it requires prior auth, it's the only medicine that will not lower BP, and treat spasticity and already on Baclofen and cannot increase due to side effects.  So will do dantrolene 50 mg nightly x 1 week, then 50 mg BID x 1 week, then 50 mg TID x 1 week, then 100 mg BID -for spasticity.   3. Would need labs in 1 month after starting dantrolene. Will need to send in a new Rx once your insurance goes into effect.   4. Con't Baclofen  5. Suggest Botox once gets Medicare of RUE- but really want to try Dantrolene first to see if actually needs Botox of RUE.    6. SCI support group- July 27th-  6-7 pm at [facility] related to [employer].     7. When legs swell, compression socks- some of them are much cuter than compression stockings but needs something when legs swell. .    8. F/U in 3 months- double appointment.  SCI  9. Continues with neurogenic bladder- compensating well- doesn't want meds for now due to side effects

## 2022-04-01 NOTE — Progress Notes (Signed)
Subjective:    Patient ID: Anna Campos, female    DOB: 04-07-1966, 56 y.o.   MRN: [id number]  HPI Patient is a 56 yr old female with incomplete Quadriplegia- C5 ASIA D due to osteomyelitis  with neurogenic bowel and bladder and spasticity as well as chronic nerve pain. Also has some depression due to her hx as well as orthostatic hypotension- off Midodrine and Florinef- but BP still low! .  February 2021. Also has constipation and bowel accidents. Cannot work due to severe spasticity and incomplete quadriplegia.    Has busy this weekend, so hasn't been eating as well.  Was 110-113 and now up to 118 lbs.   Wasn't able to get Dantrolene due to insurance issues.  Taken through a review to get Medicare.  But next month, might be able to when medicare kicks in.    Got foot up brace for R side- uses when does too much.   Hasn't been taking tramadol "as supposed to" since a lot going on- About to be grandmother.    Urgency with bowel and bladder- cannot stop it-   Pain Inventory Average Pain 5 Pain Right Now 5 My pain is aching  LOCATION OF PAIN  neck, shoulder  BOWEL Number of stools per week: 3 Oral laxative use No  Type of laxative suppostories Enema or suppository use No  History of colostomy No  Incontinent No   BLADDER Pads In and out cath, frequency . Able to self cath  . Bladder incontinence No  Frequent urination No  Leakage with coughing No  Difficulty starting stream No  Incomplete bladder emptying No    Mobility walk with assistance use a cane use a walker ability to climb steps?  yes  Function disabled: date disabled .  Neuro/Psych bladder control problems bowel control problems  Prior Studies Any changes since last visit?  no  Physicians involved in your care Any changes since last visit?  no   Family History  Problem Relation Age of Onset   Breast cancer Mother    Social History   Socioeconomic History   Marital status: Married     Spouse name: Not on file   Number of children: 1   Years of education: Not on file   Highest education level: Some college, no degree  Occupational History   Not on file  Tobacco Use   Smoking status: Former   Smokeless tobacco: Never  Building services engineer Use: Never used  Substance and Sexual Activity   Alcohol use: Yes    Comment: OCCASIONAL   Drug use: Not Currently   Sexual activity: Yes    Birth control/protection: Post-menopausal  Other Topics Concern   Not on file  Social History Narrative   Not on file   Social Determinants of Health   Financial Resource Strain: Not on file  Food Insecurity: No Food Insecurity (10/23/2021)   Hunger Vital Sign    Worried About Running Out of Food in the Last Year: Never true    Ran Out of Food in the Last Year: Never true  Transportation Needs: No Transportation Needs (10/23/2021)   PRAPARE - Administrator, Civil Service (Medical): No    Lack of Transportation (Non-Medical): No  Physical Activity: Not on file  Stress: Not on file  Social Connections: Not on file   Past Surgical History:  Procedure Laterality Date   ANTERIOR CERVICAL DECOMPRESSION/DISCECTOMY FUSION 4 LEVELS N/A 01/04/2020   Procedure: CERVICAL  THREE- FOUR, CERVICAL FOUR-FIVE, CERVICAL FIVE- SIX, CERVCAL SIX- SEVEN ANTERIOR CERVICAL DECOMPRESSION/DISCECTOMY FUSION;  Surgeon: Julio Sicks, MD;  Location: [facility] OR;  Service: Neurosurgery;  Laterality: N/A;  CERVICAL THREE- FOUR, CERVICAL FOUR-FIVE, CERVICAL FIVE- SIX, CERVCAL SIX- SEVEN ANTERIOR CERVICAL DECOMPRESSION/DISCECTOMY FUSION   CESAREAN SECTION     ESOPHAGOGASTRODUODENOSCOPY (EGD) WITH PROPOFOL N/A 12/27/2019   Procedure: ESOPHAGOGASTRODUODENOSCOPY (EGD) WITH PROPOFOL;  Surgeon: Kerin Salen, MD;  Location: [facility] ENDOSCOPY;  Service: Gastroenterology;  Laterality: N/A;  PLEASE ALERT ANESTHESIA TO THE FACT THAT THE PATIENT HAS SUSTAINED A NECK INJURY AND HAS A QUADRIPARESIS   HEMOSTASIS CLIP PLACEMENT  12/27/2019    Procedure: HEMOSTASIS CLIP PLACEMENT;  Surgeon: Kerin Salen, MD;  Location: [facility] ENDOSCOPY;  Service: Gastroenterology;;   MULTIPLE EXTRACTIONS WITH ALVEOLOPLASTY N/A 12/30/2019   Procedure: Extraction of tooth #'s 2-8, 11-14, and 20-28 with alveoloplasty and bilateral mandibular lingual tori reductions.;  Surgeon: Charlynne Pander, DDS;  Location: [facility] OR;  Service: Oral Surgery;  Laterality: N/A;   Past Medical History:  Diagnosis Date   Genital herpes    PE (pulmonary thromboembolism) (HCC) 12/20/2019   Syncope 12/20/2019   BP 100/68   Pulse 78   Ht 5' 8.5" (1.74 m)   Wt 118 lb 6.4 oz (53.7 kg)   SpO2 99%   BMI 17.74 kg/m   Opioid Risk Score:   Fall Risk Score:  `1  Depression screen [facility] 2/9     09/12/2021    9:26 AM 08/23/2021    1:35 PM 12/01/2020    1:36 PM 09/01/2020    2:35 PM 08/18/2020    3:22 PM 02/16/2020   11:18 AM  Depression screen PHQ 2/9  Decreased Interest 0 3 1 1 2  0  Down, Depressed, Hopeless 0 0 1 1 0 0  PHQ - 2 Score 0 3 2 2 2  0  Altered sleeping  1   1 1   Tired, decreased energy  3   1 1   Change in appetite  0   2 0  Feeling bad or failure about yourself   0   1 2  Trouble concentrating  1   0 0  Moving slowly or fidgety/restless  0   3 0  Suicidal thoughts  0   0 0  PHQ-9 Score  8   10 4       Review of Systems  Musculoskeletal:  Positive for neck pain.       Shoulder pain  All other systems reviewed and are negative.     Objective:   Physical Exam  Awake, alert, appropriate, still very thin;  BMI 17; NAD MS:  RUE- biceps 4+/5; Triceps 4/5; WE 4+/5; grip 4/5; FA 2/5- clawing of 3rd/4th/5th digits- less more proximally- at PIPs and DIPs- extension at [facility] LUE- Biceps 4+/5; triceps 4/5; WE 4+/5; grip 4-/5; FA 2-/5  RLE- HF 4+/5; KE 4+/5; DF 3+/5; PF 4+/5 LLE- HF 4+/5; KE 4+/5; DF 4/5; PF 5-/5  Neuro: Clawing of fingers R>L MAS MAS 1+ to 2 in RUE- MAS of 1 in LUE RLE_ MAS of 3 MAS of 2-3 on LLE  Extremities: trace LE edema to  ankles B/L  Says it usually swells more when walking more- not wearing R foot up brace    Assessment & Plan:   Patient is a 56 yr old female with incomplete Quadriplegia- C5 ASIA D due to osteomyelitis  with neurogenic bowel and bladder and spasticity as well as chronic nerve pain. Also has some depression  due to her hx as well as orthostatic hypotension- off Midodrine and Florinef- but BP still low! .  February 2021. Also has constipation and bowel accidents. Cannot work due to severe spasticity and incomplete quadriplegia.    Orthostatic hypotension- Cannot tolerate Zanaflex due to already low BP 100/68 today again. Dantrolene is our only choice  2. Since hasn't had ability to get spasticity better controlled, would best be treated with Dantrolene- although it requires prior auth, it's the only medicine that will not lower BP, and treat spasticity and already on Baclofen and cannot increase due to side effects.  So will do dantrolene 50 mg nightly x 1 week, then 50 mg BID x 1 week, then 50 mg TID x 1 week, then 100 mg BID -for spasticity.   3. Would need labs in 1 month after starting dantrolene.   4. Con't Baclofen  5. Suggest Botox once gets Medicare of RUE- but really want to try Dantrolene first to see if actually needs Botox of RUE.    6. SCI support group- July 27th[facility]  6[facility]     7. When legs swell, compression socks- some of them are much cuter than compression stockings but needs something when legs swell. .    8. F/U in 3 months- double appointment.  SCI   9. Continues with neurogenic bladder esp urgency- compensating well- doesn't want meds for it.    I spent a total of  32  minutes on total care today- >50% coordination of care- due to discussion about support group- LE edema; and spasticity- will start Dantrolene when insurance approved.

## 2022-04-30 ENCOUNTER — Encounter: Payer: Self-pay | Admitting: Physical Medicine and Rehabilitation

## 2022-06-03 ENCOUNTER — Other Ambulatory Visit: Payer: Self-pay

## 2022-06-13 ENCOUNTER — Ambulatory Visit: Payer: Medicare (Managed Care) | Attending: Internal Medicine | Admitting: Internal Medicine

## 2022-06-13 ENCOUNTER — Encounter: Payer: Self-pay | Admitting: Internal Medicine

## 2022-06-13 VITALS — BP 104/74 | HR 83 | Ht 68.0 in | Wt 105.0 lb

## 2022-06-13 DIAGNOSIS — R634 Abnormal weight loss: Secondary | ICD-10-CM | POA: Diagnosis not present

## 2022-06-13 DIAGNOSIS — R152 Fecal urgency: Secondary | ICD-10-CM | POA: Diagnosis not present

## 2022-06-13 DIAGNOSIS — F320 Major depressive disorder, single episode, mild: Secondary | ICD-10-CM

## 2022-06-13 DIAGNOSIS — L659 Nonscarring hair loss, unspecified: Secondary | ICD-10-CM | POA: Diagnosis not present

## 2022-06-13 DIAGNOSIS — R636 Underweight: Secondary | ICD-10-CM | POA: Diagnosis not present

## 2022-06-13 DIAGNOSIS — R159 Full incontinence of feces: Secondary | ICD-10-CM | POA: Diagnosis not present

## 2022-06-13 DIAGNOSIS — Z862 Personal history of diseases of the blood and blood-forming organs and certain disorders involving the immune mechanism: Secondary | ICD-10-CM

## 2022-06-13 NOTE — Progress Notes (Signed)
Patient ID: Anna Campos, female    DOB: 30-Mar-1966  MRN: [id number]  CC: chronic ds management  Subjective: Anna Campos is a 56 y.o. female who presents for chronic ds management.  Husband is with her Her concerns today include:  Patient with history of incomplete quadriplegia at C5-6 level, neurogenic bowel, neurogenic bladder, nerve pain, anemia, hypotension (was on Florinef), gastric ulcers, PE in the setting of acute illness, HL, genital herpes.    Incomplete quadriplegia: She is followed by Dr. Birdie Riddle with PMR.   No recent falls "but energy levels comes and goes."  Admits to mild depression. Frustrated at times because of some of her physical limitations Off Paxil since 08/2021. Does not like the way it makes her feel Plans to go to support group once a mth for patients dealing with similar limitations.  The support group is offered through PMR   Has neurogenic bowel and bladder.  No longer on Vesicare.  Reports she has not had too many issues lately with incontinence of urine.  However she has intermittent constipation.  Took MiraLAX for the first time 2 to 3 days ago and it caused diarrhea with incontinence.  She has lost 13 pounds since June of this year.  Attributes this to decreased appetite.  Feels part of the decreased appetite is due to depression.  Has 1-3 meals a day.  Drinks 1 can of Ensure a day to supplement meals.  Denies any palpitations or feeling hot all the time.  Endorses hair breakage/hair loss.  Hands and feet feel cold all the time but this is not new.   She is up to date with age-appropriate cancer screenings including colonoscopy, Pap smear and mammogram Denies any problems swallowing.  No stomach pains or blood in her stools No fevers or night sweats. No hematuria. Patient Active Problem List   Diagnosis Date Noted   Right foot drop 12/17/2021   Decreased range of motion of finger of right hand 09/12/2021   Neurogenic orthostatic hypotension (HCC)  08/23/2021   Chronic pain syndrome 12/01/2020   Mixed hyperlipidemia 10/11/2020   Gastric ulcer 10/11/2020   Spasticity 02/16/2020   Nerve pain 02/16/2020   Abnormality of gait 02/16/2020   Pressure injury of skin 01/15/2020   Transaminitis    Hyponatremia    Anemia    Neurogenic bowel 01/11/2020   Neurogenic bladder 01/11/2020   Malnutrition of moderate degree 01/01/2020   Poor dentition    Spinal stenosis of cervical region    Incomplete quadriplegia at C5-6 level (HCC)    Hypotension    Bacteremia    Syncope 12/19/2019     Current Outpatient Medications on File Prior to Visit  Medication Sig Dispense Refill   acetaminophen (TYLENOL) 325 MG tablet Take 2 tablets (650 mg total) by mouth every 4 (four) hours as needed for mild pain (temp > 101.5).     baclofen (LIORESAL) 20 MG tablet Take 1 tablet (20 mg total) by mouth 4 (four) times daily. Spasticity with dantrolene 120 each 5   BACLOFEN PO      dantrolene (DANTRIUM) 25 MG capsule Take 1 capsule (25 mg total) by mouth at bedtime. X 1 week, then IF need be, increase to 50 mg nightly x 1 week, then max 50 mg 2x/day- only increase if need be- for spasticity 120 capsule 5   metoCLOPramide (REGLAN) 10 MG tablet Take 1 tablet (10 mg total) by mouth every 6 (six) hours. 15 tablet 0   Multiple  Vitamin (MULTIVITAMIN WITH MINERALS) TABS tablet Take 1 tablet by mouth daily.     PARoxetine (PAXIL) 10 MG tablet Take 1 tab by mouth daily for  2 weeks, then take 1/2 tab daily for 2 weeks, then 1 tab every other day until done (Patient taking differently: Take 1 tab by mouth daily for  2 weeks, then take 1/2 tab daily for 2 weeks, then 1 tab every other day until done) 30 tablet 0   traMADol (ULTRAM) 50 MG tablet Take 50 mg by mouth every 8 (eight) hours as needed.     loratadine (CLARITIN) 10 MG tablet TAKE 1 TABLET (10 MG TOTAL) BY MOUTH DAILY. 30 tablet 2   No current facility-administered medications on file prior to visit.    Allergies   Allergen Reactions   Ampicillin Diarrhea   Chlorhexidine     Social History   Socioeconomic History   Marital status: Married    Spouse name: Not on file   Number of children: 1   Years of education: Not on file   Highest education level: Some college, no degree  Occupational History   Not on file  Tobacco Use   Smoking status: Former   Smokeless tobacco: Never  Vaping Use   Vaping Use: Never used  Substance and Sexual Activity   Alcohol use: Yes    Comment: OCCASIONAL   Drug use: Not Currently   Sexual activity: Yes    Birth control/protection: Post-menopausal  Other Topics Concern   Not on file  Social History Narrative   Not on file   Social Determinants of Health   Financial Resource Strain: Not on file  Food Insecurity: No Food Insecurity (10/23/2021)   Hunger Vital Sign    Worried About Running Out of Food in the Last Year: Never true    Ran Out of Food in the Last Year: Never true  Transportation Needs: No Transportation Needs (10/23/2021)   PRAPARE - Administrator, Civil Service (Medical): No    Lack of Transportation (Non-Medical): No  Physical Activity: Not on file  Stress: Not on file  Social Connections: Not on file  Intimate Partner Violence: Not on file    Family History  Problem Relation Age of Onset   Breast cancer Mother     Past Surgical History:  Procedure Laterality Date   ANTERIOR CERVICAL DECOMPRESSION/DISCECTOMY FUSION 4 LEVELS N/A 01/04/2020   Procedure: CERVICAL THREE- FOUR, CERVICAL FOUR-FIVE, CERVICAL FIVE- SIX, CERVCAL SIX- SEVEN ANTERIOR CERVICAL DECOMPRESSION/DISCECTOMY FUSION;  Surgeon: Julio Sicks, MD;  Location: [facility] OR;  Service: Neurosurgery;  Laterality: N/A;  CERVICAL THREE- FOUR, CERVICAL FOUR-FIVE, CERVICAL FIVE- SIX, CERVCAL SIX- SEVEN ANTERIOR CERVICAL DECOMPRESSION/DISCECTOMY FUSION   CESAREAN SECTION     ESOPHAGOGASTRODUODENOSCOPY (EGD) WITH PROPOFOL N/A 12/27/2019   Procedure: ESOPHAGOGASTRODUODENOSCOPY (EGD)  WITH PROPOFOL;  Surgeon: Kerin Salen, MD;  Location[facility] ENDOSCOPY;  Service: Gastroenterology;  Laterality: N/A;  PLEASE ALERT ANESTHESIA TO THE FACT THAT THE PATIENT HAS SUSTAINED A NECK INJURY AND HAS A QUADRIPARESIS   HEMOSTASIS CLIP PLACEMENT  12/27/2019   Procedure: HEMOSTASIS CLIP PLACEMENT;  Surgeon: Kerin Salen, MD;  Location: [facility] ENDOSCOPY;  Service: Gastroenterology;;   MULTIPLE EXTRACTIONS WITH ALVEOLOPLASTY N/A 12/30/2019   Procedure: Extraction of tooth #'s 2-8, 11-14, and 20-28 with alveoloplasty and bilateral mandibular lingual tori reductions.;  Surgeon: Charlynne Pander, DDS;  Location: [facility] OR;  Service: Oral Surgery;  Laterality: N/A;    ROS: Review of Systems Negative except as stated above  PHYSICAL EXAM: BP 104/74   Pulse 83   Ht 5\' 8"  (1.727 m)   Wt 105 lb (47.6 kg)   SpO2 100%   BMI 15.97 kg/m   Wt Readings from Last 3 Encounters:  06/13/22 105 lb (47.6 kg)  04/01/22 118 lb 6.4 oz (53.7 kg)  12/17/21 118 lb (53.5 kg)    Physical Exam  General appearance -middle-age African-American female with noted weight loss and temporal wasting. Mental status - normal mood, behavior, speech, dress,  and thought processes Eyes -pale conjunctiva Mouth -oral mucosa is moist. Neck -no cervical lymphadenopathy.  No thyromegaly or thyroid nodules appreciated. Chest - clear to auscultation, no wheezes, rales or rhonchi, symmetric air entry Heart - normal rate, regular rhythm, normal S1, S2, no murmurs, rubs, clicks or gallops Abdomen-normal bowel sounds, nondistended, soft and nontender.  No organomegaly Extremities -no lower extremity edema. Skin: She has natural hair.  Some breakage noted around the edges especially front.     Latest Ref Rng & Units 06/14/2022    2:45 PM 12/30/2020    8:15 PM 08/18/2020    3:20 PM  CMP  Glucose 70 - 99 mg/dL 98  96  77   BUN 6 - 24 mg/dL 17  25  12    Creatinine 0.57 - 1.00 mg/dL 08/20/2020   8.27   Sodium 134 - 144 mmol/L 141  141  142    Potassium 3.5 - 5.2 mmol/L 3.9  4.3  4.1   Chloride 96 - 106 mmol/L 101  103  104   CO2 20 - 29 mmol/L 19  26  26    Calcium 8.7 - 10.2 mg/dL 9.9  0.78  9.6   Total Protein 6.0 - 8.5 g/dL 7.1  8.0  6.7   Total Bilirubin 0.0 - 1.2 mg/dL 0.6  0.8  0.4   Alkaline Phos 44 - 121 IU/L 130  96  114   AST 0 - 40 IU/L 22  20  24    ALT 0 - 32 IU/L 8  11  13      CBC    Component Value Date/Time   WBC 5.5 06/14/2022 1445   WBC 5.8 12/30/2020 2015   RBC 4.18 06/14/2022 1445   RBC 4.71 12/30/2020 2015   HGB 12.2 06/14/2022 1445   HCT 38.0 06/14/2022 1445   PLT 269 06/14/2022 1445   MCV 91 06/14/2022 1445   MCH 29.2 06/14/2022 1445   MCH 29.5 12/30/2020 2015   MCHC 32.1 06/14/2022 1445   MCHC 32.9 12/30/2020 2015   RDW 11.8 06/14/2022 1445   LYMPHSABS 2.0 12/30/2020 2015   MONOABS 0.4 12/30/2020 2015   EOSABS 0.1 12/30/2020 2015   BASOSABS 0.0 12/30/2020 2015    ASSESSMENT AND PLAN: 1. Unintentional weight loss 2. Underweight -Patient reports poor appetite with some depression.  However we will do baseline blood tests including thyroid panel to rule out any other underlying disorders. -Recommend eating smaller but more frequent meals.  Increase Ensure shakes to at least 2-3  a day. Discussed putting her on an antidepressant but patient does not seem too interested at this time.  We talked about trying her with Remeron if lab studies come back normal.  At least the Remeron will help increase appetite. She plans to join the support group through PMR and I think this is a good idea.  Declines referral for one on one counseling - CBC; Future - Comprehensive metabolic panel; Future - TSH+T4F+T3Free; Future  3. Major depressive  disorder, single episode, mild (HCC) See #2 above.  4. Incontinence of feces with fecal urgency Recommend that in the future when she experiences constipation, she can try drinking a little bit of prune juice or eating a few prunes.  5. Hair loss Check TSH.  If  normal we will refer to dermatology.  Addendum 06/15/2022: TSH is normal.  We will refer to dermatology for the hair loss. Blood cell counts are normal but hemoglobin/hematocrit trending down again.  We will add iron studies.  Patient was given the opportunity to ask questions.  Patient verbalized understanding of the plan and was able to repeat key elements of the plan.   This documentation was completed using Paediatric nurse.  Any transcriptional errors are unintentional.  Orders Placed This Encounter  Procedures   CBC   Comprehensive metabolic panel   [id number]     Requested Prescriptions    No prescriptions requested or ordered in this encounter    Return in about 6 weeks (around 07/25/2022).  Jonah Blue, MD, FACP

## 2022-06-13 NOTE — Patient Instructions (Signed)
Try to eat smaller but more frequent meals. Supplement your meals with Ensure. You can drink a little prune juice or eat a few prunes when you have constipation. Please return to the lab tomorrow or sometime next week to have blood test done.

## 2022-06-14 ENCOUNTER — Ambulatory Visit: Payer: Medicare (Managed Care) | Attending: Internal Medicine

## 2022-06-14 DIAGNOSIS — R634 Abnormal weight loss: Secondary | ICD-10-CM

## 2022-06-15 LAB — COMPREHENSIVE METABOLIC PANEL
ALT: 8 IU/L (ref 0–32)
AST: 22 IU/L (ref 0–40)
Albumin/Globulin Ratio: 2 (ref 1.2–2.2)
Albumin: 4.7 g/dL (ref 3.8–4.9)
Alkaline Phosphatase: 130 IU/L — ABNORMAL HIGH (ref 44–121)
BUN/Creatinine Ratio: 19 (ref 9–23)
BUN: 17 mg/dL (ref 6–24)
Bilirubin Total: 0.6 mg/dL (ref 0.0–1.2)
CO2: 19 mmol/L — ABNORMAL LOW (ref 20–29)
Calcium: 9.9 mg/dL (ref 8.7–10.2)
Chloride: 101 mmol/L (ref 96–106)
Creatinine, Ser: 0.89 mg/dL (ref 0.57–1.00)
Globulin, Total: 2.4 g/dL (ref 1.5–4.5)
Glucose: 98 mg/dL (ref 70–99)
Potassium: 3.9 mmol/L (ref 3.5–5.2)
Sodium: 141 mmol/L (ref 134–144)
Total Protein: 7.1 g/dL (ref 6.0–8.5)
eGFR: 77 mL/min/{1.73_m2} (ref >59)

## 2022-06-15 LAB — TSH+T4F+T3FREE
Free T4: 1.64 ng/dL (ref 0.82–1.77)
T3, Free: 3 pg/mL (ref 2.0–4.4)
TSH: 1.62 u[IU]/mL (ref 0.450–4.500)

## 2022-06-15 LAB — CBC
Hematocrit: 38 % (ref 34.0–46.6)
Hemoglobin: 12.2 g/dL (ref 11.1–15.9)
MCH: 29.2 pg (ref 26.6–33.0)
MCHC: 32.1 g/dL (ref 31.5–35.7)
MCV: 91 fL (ref 79–97)
Platelets: 269 10*3/uL (ref 150–450)
RBC: 4.18 x10E6/uL (ref 3.77–5.28)
RDW: 11.8 % (ref 11.7–15.4)
WBC: 5.5 10*3/uL (ref 3.4–10.8)

## 2022-06-20 ENCOUNTER — Telehealth: Payer: Self-pay | Admitting: Internal Medicine

## 2022-06-20 NOTE — Telephone Encounter (Signed)
Copied from CRM [id number]. Topic: Appointment Scheduling - Scheduling Inquiry for Clinic >> Jun 19, 2022  5:21 PM Franchot Heidelberg wrote: Reason for CRM: Pt called and wants to know if her PCP wants her to keep her upcoming appt in November.   Best contact: [phone]

## 2022-06-21 NOTE — Telephone Encounter (Signed)
LVM for pt to RTC.---DD,RMA    (Checked pt chart last OV w/ pcp was 06/13/22 per pcp notes via D/C summary pcp informed pt to return in 6wks for f/u. Inform pt to keep upcoming f/u appt w/ pcp 08/22/22.)

## 2022-06-25 NOTE — Telephone Encounter (Signed)
VM was left informing patient that she needs to keep appointment in November.

## 2022-07-10 LAB — IRON,TIBC AND FERRITIN PANEL
Ferritin: 139 ng/mL (ref 15–150)
Iron Saturation: 30 % (ref 15–55)
Iron: 83 ug/dL (ref 27–159)
Total Iron Binding Capacity: 280 ug/dL (ref 250–450)
UIBC: 197 ug/dL (ref 131–425)

## 2022-07-10 LAB — SPECIMEN STATUS REPORT

## 2022-07-26 ENCOUNTER — Other Ambulatory Visit: Payer: Self-pay

## 2022-07-26 ENCOUNTER — Encounter
Payer: Medicare (Managed Care) | Attending: Physical Medicine and Rehabilitation | Admitting: Physical Medicine and Rehabilitation

## 2022-07-26 ENCOUNTER — Encounter: Payer: Self-pay | Admitting: Physical Medicine and Rehabilitation

## 2022-07-26 VITALS — BP 102/70 | HR 90 | Ht 68.0 in | Wt 112.6 lb

## 2022-07-26 DIAGNOSIS — R252 Cramp and spasm: Secondary | ICD-10-CM | POA: Diagnosis not present

## 2022-07-26 DIAGNOSIS — G8250 Quadriplegia, unspecified: Secondary | ICD-10-CM | POA: Insufficient documentation

## 2022-07-26 DIAGNOSIS — M792 Neuralgia and neuritis, unspecified: Secondary | ICD-10-CM | POA: Insufficient documentation

## 2022-07-26 DIAGNOSIS — K592 Neurogenic bowel, not elsewhere classified: Secondary | ICD-10-CM | POA: Insufficient documentation

## 2022-07-26 DIAGNOSIS — G894 Chronic pain syndrome: Secondary | ICD-10-CM | POA: Diagnosis not present

## 2022-07-26 DIAGNOSIS — N319 Neuromuscular dysfunction of bladder, unspecified: Secondary | ICD-10-CM | POA: Insufficient documentation

## 2022-07-26 MED ORDER — BACLOFEN 20 MG PO TABS
20.0000 mg | ORAL_TABLET | Freq: Four times a day (QID) | ORAL | 5 refills | Status: DC
  Filled 2022-07-26 (×2): qty 120, 30d supply, fill #0

## 2022-07-26 MED ORDER — SOLIFENACIN SUCCINATE 5 MG PO TABS
5.0000 mg | ORAL_TABLET | Freq: Every day | ORAL | 5 refills | Status: DC
  Filled 2022-07-26: qty 30, 30d supply, fill #0

## 2022-07-26 NOTE — Patient Instructions (Signed)
Patient is a 56 yr old female with incomplete Quadriplegia- C5 ASIA D due to osteomyelitis  with neurogenic bowel and bladder and spasticity as well as chronic nerve pain. Also has some depression due to her hx as well as orthostatic hypotension- off Midodrine and Florinef- but BP still low! .  February 2021. Also has constipation and bowel accidents. Cannot work due to severe spasticity and incomplete quadriplegia.   here for f/u on SCI  Baclofen 20 mg =- 1 tab in Am, 1 tab in afternoon and 2 tabs at night- for increase in spasticity at night- so Rx is written 4x/day, but actually take 3x/day- #5 refills  2. Will stop Dantrolene- pt has already stopped-   3. Vesicare 5 mg daily- for bladder urgency due to SCI-   4. Bowel program- takes 3-6 weeks- do the same time  every day- to train the gut- do rectal/anal stimulation to 2nd knuckle with glove-  first have a BM if you can- do dig stim then do again 10-15 minutes later.  Will dramatically improve bowel accidents within 3-6 weeks. Best done after 1 meal-    5. Pt will require disability and not be able to work for lifetime due to incomplete quadriplegia, bowel and bladder incontinence due to her Spinal cord injury from osteomyelitis of her cervical spine. She has very little usage of her hands and cannot even wipe to use bathroom, so I do not see how she can ever work again.   6. Has clawing of R>L hands due to spasticity- but explained it's more functional to use the hand better to have mild clawing.    7. SCI support group- 6-7 pm- [address]- last Thursday of the month- will see if someone form SCI support can bring her.    8.  F/U in 3 months- double visit.

## 2022-07-26 NOTE — Progress Notes (Signed)
Subjective:    Patient ID: Anna Campos, female    DOB: 14-Jan-1966, 56 y.o.   MRN: [id number]  HPI  Patient is a 56 yr old female with incomplete Quadriplegia- C5 ASIA D due to osteomyelitis  with neurogenic bowel and bladder and spasticity as well as chronic nerve pain. Also has some depression due to her hx as well as orthostatic hypotension- off Midodrine and Florinef- but BP still low! .  February 2021. Also has constipation and bowel accidents. Cannot work due to severe spasticity and incomplete quadriplegia.   Here for f/u on SCI   Doesn't like Dantrolene- it makes her have abd pain and feel Blah.  Baclofen helps- takes 20 mg 3x/day- doesn't take 4x/day. Took almost 1 month.    Stopped bladder medicine- Vesicare because thought had bladder :under control" but does NOT! Urinary urgency- sometimes has accidents as often as 2x/day. Usually at least 3x/week.     Still no control of bowels- still having accidents-  Gets constipated a lot- has to do suppository regularly. Does 1-2 week to 1-2x/month.    Pain Inventory Average Pain 7 Pain Right Now 7 My pain is sharp and aching  LOCATION OF PAIN  neck, shoulder, back  BOWEL Number of stools per week: 3 Oral laxative use Yes  Type of laxative Miralax Enema or suppository use Yes    BLADDER Pads    Mobility walk with assistance use a cane use a walker ability to climb steps?  yes Do you have any goals in this area?  yes  Function disabled: date disabled 12/15/19  Neuro/Psych bladder control problems bowel control problems weakness  Prior Studies Any changes since last visit?  no  Physicians involved in your care Any changes since last visit?  no   Family History  Problem Relation Age of Onset   Breast cancer Mother    Social History   Socioeconomic History   Marital status: Married    Spouse name: Not on file   Number of children: 1   Years of education: Not on file   Highest education  level: Some college, no degree  Occupational History   Not on file  Tobacco Use   Smoking status: Former   Smokeless tobacco: Never  Scientific laboratory technician Use: Never used  Substance and Sexual Activity   Alcohol use: Yes    Comment: OCCASIONAL   Drug use: Not Currently   Sexual activity: Yes    Birth control/protection: Post-menopausal  Other Topics Concern   Not on file  Social History Narrative   Not on file   Social Determinants of Health   Financial Resource Strain: Not on file  Food Insecurity: No Food Insecurity (10/23/2021)   Hunger Vital Sign    Worried About Running Out of Food in the Last Year: Never true    Ran Out of Food in the Last Year: Never true  Transportation Needs: No Transportation Needs (10/23/2021)   PRAPARE - Hydrologist (Medical): No    Lack of Transportation (Non-Medical): No  Physical Activity: Not on file  Stress: Not on file  Social Connections: Not on file   Past Surgical History:  Procedure Laterality Date   ANTERIOR CERVICAL DECOMPRESSION/DISCECTOMY FUSION 4 LEVELS N/A 01/04/2020   Procedure: CERVICAL THREE- FOUR, CERVICAL FOUR-FIVE, CERVICAL FIVE- SIX, CERVCAL SIX- SEVEN ANTERIOR CERVICAL DECOMPRESSION/DISCECTOMY FUSION;  Surgeon: Earnie Larsson, MD;  Location: [facility];  Service: Neurosurgery;  Laterality: N/A;  CERVICAL  THREE- FOUR, CERVICAL FOUR-FIVE, CERVICAL FIVE- SIX, CERVCAL SIX- SEVEN ANTERIOR CERVICAL DECOMPRESSION/DISCECTOMY FUSION   CESAREAN SECTION     ESOPHAGOGASTRODUODENOSCOPY (EGD) WITH PROPOFOL N/A 12/27/2019   Procedure: ESOPHAGOGASTRODUODENOSCOPY (EGD) WITH PROPOFOL;  Surgeon: Kerin Salen, MD;  Location: [facility] ENDOSCOPY;  Service: Gastroenterology;  Laterality: N/A;  PLEASE ALERT ANESTHESIA TO THE FACT THAT THE PATIENT HAS SUSTAINED A NECK INJURY AND HAS A QUADRIPARESIS   HEMOSTASIS CLIP PLACEMENT  12/27/2019   Procedure: HEMOSTASIS CLIP PLACEMENT;  Surgeon: Kerin Salen, MD;  Location: [facility] ENDOSCOPY;  Service:  Gastroenterology;;   MULTIPLE EXTRACTIONS WITH ALVEOLOPLASTY N/A 12/30/2019   Procedure: Extraction of tooth #'s 2-8, 11-14, and 20-28 with alveoloplasty and bilateral mandibular lingual tori reductions.;  Surgeon: Charlynne Pander, DDS;  Location: [facility] OR;  Service: Oral Surgery;  Laterality: N/A;   Past Medical History:  Diagnosis Date   Genital herpes    PE (pulmonary thromboembolism) (HCC) 12/20/2019   Syncope 12/20/2019   BP 102/70   Pulse 90   Ht 5\' 8"  (1.727 m)   Wt 112 lb 9.6 oz (51.1 kg)   SpO2 96%   BMI 17.12 kg/m   Opioid Risk Score:   Fall Risk Score:  `1  Depression screen [facility] 2/9     07/26/2022    2:31 PM 09/12/2021    9:26 AM 08/23/2021    1:35 PM 12/01/2020    1:36 PM 09/01/2020    2:35 PM 08/18/2020    3:22 PM 02/16/2020   11:18 AM  Depression screen [facility] 2/9  Decreased Interest 0 0 3 1 1 2  0  Down, Depressed, Hopeless 0 0 0 1 1 0 0  PHQ - 2 Score 0 0 3 2 2 2  0  Altered sleeping   1   1 1   Tired, decreased energy   3   1 1   Change in appetite   0   2 0  Feeling bad or failure about yourself    0   1 2  Trouble concentrating   1   0 0  Moving slowly or fidgety/restless   0   3 0  Suicidal thoughts   0   0 0  PHQ-9 Score   8   10 4      Review of Systems  Constitutional: Negative.   HENT: Negative.    Eyes: Negative.   Respiratory: Negative.    Cardiovascular: Negative.   Gastrointestinal:  Positive for constipation.  Endocrine: Negative.   Genitourinary:  Positive for urgency.  Musculoskeletal:  Positive for back pain, gait problem and neck pain.  Skin: Negative.   Allergic/Immunologic: Negative.   Neurological:  Positive for weakness.  Hematological: Negative.   Psychiatric/Behavioral: Negative.        Objective:   Physical Exam Weight up to 112 lbs- BMI 17 Appears frail Awake, alert, using quad cane to walk; accompanied by husband, NAD Neuro: Hoffman's RUE; not LUE, but has clonus on L wrist 5 beats clonus LLE and 2-3 beats RLE MAS of  2-3 in  LE's esp LLE and LUE is MAS of 2  Clawing of R>L hands-      Assessment & Plan:   Patient is a 56 yr old female with incomplete Quadriplegia- C5 ASIA D due to osteomyelitis  with neurogenic bowel and bladder and spasticity as well as chronic nerve pain. Also has some depression due to her hx as well as orthostatic hypotension- off Midodrine and Florinef- but BP still low! .  February 2021. Also  has constipation and bowel accidents. Cannot work due to severe spasticity and incomplete quadriplegia.   here for f/u on SCI  Baclofen 20 mg =- 1 tab in Am, 1 tab in afternoon and 2 tabs at night- for increase in spasticity at night- so Rx is written 4x/day, but actually take 3x/day- #5 refills  2. Will stop Dantrolene- pt has already stopped-   3. For urinary inconitnence- bladder- neurogenic- Vesicare 5 mg daily- for bladder urgency due to SCI-   4. Bowel program- takes 3-6 weeks- do the same time  every day- to train the gut- do rectal/anal stimulation to 2nd knuckle with glove-  first have a BM if you can- do dig stim then do again 10-15 minutes later.  Will dramatically improve bowel accidents within 3-6 weeks. Best done after 1 meal-    5. Pt will require disability and not be able to work for lifetime due to incomplete quadriplegia, bowel and bladder incontinence due to her Spinal cord injury from osteomyelitis of her cervical spine. She has very little usage of her hands and cannot even wipe to use bathroom, so I do not see how she can ever work again.   6. Has clawing of R>L hands due to spasticity- but explained it's more functional to use the hand better to have mild clawing.    7. SCI support group- 6-7 pm- [address]- last Thursday of the month- will see if someone from SCI support can bring her.    8.  F/U in 3 months- double visit. SCI    I spent a total of  34  minutes on total care today- >50% coordination of care- due to discussing bowel program, Support group;  bladder and bowel issues, as detailed above. Marland Kitchen

## 2022-08-02 ENCOUNTER — Other Ambulatory Visit: Payer: Self-pay

## 2022-08-22 ENCOUNTER — Ambulatory Visit: Payer: Medicare (Managed Care) | Attending: Internal Medicine | Admitting: Internal Medicine

## 2022-08-22 ENCOUNTER — Encounter: Payer: Self-pay | Admitting: Internal Medicine

## 2022-08-22 VITALS — BP 104/72 | HR 77 | Temp 97.9°F | Ht 68.0 in | Wt 113.8 lb

## 2022-08-22 DIAGNOSIS — S14109A Unspecified injury at unspecified level of cervical spinal cord, initial encounter: Secondary | ICD-10-CM | POA: Insufficient documentation

## 2022-08-22 DIAGNOSIS — Z1231 Encounter for screening mammogram for malignant neoplasm of breast: Secondary | ICD-10-CM

## 2022-08-22 DIAGNOSIS — Z2821 Immunization not carried out because of patient refusal: Secondary | ICD-10-CM

## 2022-08-22 DIAGNOSIS — W1830XA Fall on same level, unspecified, initial encounter: Secondary | ICD-10-CM | POA: Insufficient documentation

## 2022-08-22 DIAGNOSIS — E441 Mild protein-calorie malnutrition: Secondary | ICD-10-CM | POA: Diagnosis not present

## 2022-08-22 DIAGNOSIS — L659 Nonscarring hair loss, unspecified: Secondary | ICD-10-CM

## 2022-08-22 DIAGNOSIS — G8250 Quadriplegia, unspecified: Secondary | ICD-10-CM

## 2022-08-22 NOTE — Progress Notes (Signed)
Patient ID: Anna Campos, female    DOB: 05-10-1966  MRN: [id number]  CC: Hypotension   Subjective: Anna Campos is a 56 y.o. female who presents for chronic ds management.  Husband is with her Her concerns today include:  Patient with history of incomplete quadriplegia at C5-6 level, neurogenic bowel, neurogenic bladder, nerve pain, anemia, hypotension (was on Florinef), gastric ulcers, PE in the setting of acute illness, HL, genital herpes.   Hair loss:  Thyroid level was nl.  Referred to derm.  [place] derm did not accept her insurance Trying a new shampoo  Wgh Loss: up 7 lbs since last visit.  Eating smaller but more frequent meals + 3 shakes a day   Doing better mentally.  Never did get into the support group through [facility]. No transportation to group meetings and spouse who does the driving works third shift.  She has been staying busy helping to take care of her 80-month old grandson and finding joy in this..  Saw Dr. Berline Chough 1 mth ago.  Currently on Baclofen for spasms. Back on Vesicare.  Has dec urgency and flow on the medication.  She has not had any falls.  She ambulates with a quad cane  HM:  flu shot make her feel weak and sore in hands so decided against getting it any more.  Declines shingles. Patient Active Problem List   Diagnosis Date Noted   Fall on same level 08/22/2022   Cervical spinal cord injury (HCC) 08/22/2022   Right foot drop 12/17/2021   Decreased range of motion of finger of right hand 09/12/2021   Neurogenic orthostatic hypotension (HCC) 08/23/2021   Chronic low back pain 12/01/2020   Mixed hyperlipidemia 10/11/2020   Gastric ulcer 10/11/2020   Spasticity 02/16/2020   Nerve pain 02/16/2020   Abnormality of gait 02/16/2020   Pressure injury of skin 01/15/2020   Transaminitis    Hyponatremia    Anemia    Neurogenic bowel 01/11/2020   Neurogenic bladder 01/11/2020   Malnutrition of moderate degree 01/01/2020   Poor dentition    Spinal stenosis of  cervical region    Chronic incomplete quadriplegia (HCC)    Hypotension    Bacteremia    Syncope 12/19/2019     Current Outpatient Medications on File Prior to Visit  Medication Sig Dispense Refill   acetaminophen (TYLENOL) 325 MG tablet Take 2 tablets (650 mg total) by mouth every 4 (four) hours as needed for mild pain (temp > 101.5).     baclofen (LIORESAL) 20 MG tablet Take 1 tablet (20 mg total) by mouth 4 (four) times daily. For spasticity 120 each 5   Multiple Vitamin (MULTIVITAMIN WITH MINERALS) TABS tablet Take 1 tablet by mouth daily.     solifenacin (VESICARE) 5 MG tablet Take 1 tablet (5 mg total) by mouth daily. 30 tablet 5   loratadine (CLARITIN) 10 MG tablet TAKE 1 TABLET (10 MG TOTAL) BY MOUTH DAILY. 30 tablet 2   metoCLOPramide (REGLAN) 10 MG tablet Take 1 tablet (10 mg total) by mouth every 6 (six) hours. (Patient not taking: Reported on 08/22/2022) 15 tablet 0   PARoxetine (PAXIL) 10 MG tablet Take 1 tab by mouth daily for  2 weeks, then take 1/2 tab daily for 2 weeks, then 1 tab every other day until done (Patient not taking: Reported on 08/22/2022) 30 tablet 0   traMADol (ULTRAM) 50 MG tablet Take 50 mg by mouth every 8 (eight) hours as needed. (Patient not taking:  Reported on 08/22/2022)     No current facility-administered medications on file prior to visit.    Allergies  Allergen Reactions   Ampicillin Diarrhea   Chlorhexidine     Social History   Socioeconomic History   Marital status: Married    Spouse name: Not on file   Number of children: 1   Years of education: Not on file   Highest education level: Some college, no degree  Occupational History   Not on file  Tobacco Use   Smoking status: Former   Smokeless tobacco: Never  Vaping Use   Vaping Use: Never used  Substance and Sexual Activity   Alcohol use: Yes    Comment: OCCASIONAL   Drug use: Not Currently   Sexual activity: Yes    Birth control/protection: Post-menopausal  Other Topics Concern    Not on file  Social History Narrative   Not on file   Social Determinants of Health   Financial Resource Strain: Not on file  Food Insecurity: No Food Insecurity (10/23/2021)   Hunger Vital Sign    Worried About Running Out of Food in the Last Year: Never true    Ran Out of Food in the Last Year: Never true  Transportation Needs: No Transportation Needs (10/23/2021)   PRAPARE - Administrator, Civil Service (Medical): No    Lack of Transportation (Non-Medical): No  Physical Activity: Not on file  Stress: Not on file  Social Connections: Not on file  Intimate Partner Violence: Not on file    Family History  Problem Relation Age of Onset   Breast cancer Mother     Past Surgical History:  Procedure Laterality Date   ANTERIOR CERVICAL DECOMPRESSION/DISCECTOMY FUSION 4 LEVELS N/A 01/04/2020   Procedure: CERVICAL THREE- FOUR, CERVICAL FOUR-FIVE, CERVICAL FIVE- SIX, CERVCAL SIX- SEVEN ANTERIOR CERVICAL DECOMPRESSION/DISCECTOMY FUSION;  Surgeon: Julio Sicks, MD;  Location: [facility] OR;  Service: Neurosurgery;  Laterality: N/A;  CERVICAL THREE- FOUR, CERVICAL FOUR-FIVE, CERVICAL FIVE- SIX, CERVCAL SIX- SEVEN ANTERIOR CERVICAL DECOMPRESSION/DISCECTOMY FUSION   CESAREAN SECTION     ESOPHAGOGASTRODUODENOSCOPY (EGD) WITH PROPOFOL N/A 12/27/2019   Procedure: ESOPHAGOGASTRODUODENOSCOPY (EGD) WITH PROPOFOL;  Surgeon: Kerin Salen, MD;  Location: [facility] ENDOSCOPY;  Service: Gastroenterology;  Laterality: N/A;  PLEASE ALERT ANESTHESIA TO THE FACT THAT THE PATIENT HAS SUSTAINED A NECK INJURY AND HAS A QUADRIPARESIS   HEMOSTASIS CLIP PLACEMENT  12/27/2019   Procedure: HEMOSTASIS CLIP PLACEMENT;  Surgeon: Kerin Salen, MD;  Location: [facility] ENDOSCOPY;  Service: Gastroenterology;;   MULTIPLE EXTRACTIONS WITH ALVEOLOPLASTY N/A 12/30/2019   Procedure: Extraction of tooth #'s 2-8, 11-14, and 20-28 with alveoloplasty and bilateral mandibular lingual tori reductions.;  Surgeon: Charlynne Pander, DDS;  Location: [facility]  OR;  Service: Oral Surgery;  Laterality: N/A;    ROS: Review of Systems Negative except as stated above  PHYSICAL EXAM: BP 104/72   Pulse 77   Temp 97.9 F (36.6 C) (Temporal)   Ht 5\' 8"  (1.727 m)   Wt 113 lb 12.8 oz (51.6 kg)   SpO2 98%   BMI 17.30 kg/m   Wt Readings from Last 3 Encounters:  08/22/22 113 lb 12.8 oz (51.6 kg)  07/26/22 112 lb 9.6 oz (51.1 kg)  06/13/22 105 lb (47.6 kg)    Physical Exam  General appearance -middle-aged African-American female in NAD.  She has temporal wasting.  She is underweight for height and appearance Mental status - normal mood, behavior, speech, dress, motor activity, and thought processes Chest - clear  to auscultation, no wheezes, rales or rhonchi, symmetric air entry Heart - normal rate, regular rhythm, normal S1, S2, no murmurs, rubs, clicks or gallops Extremities -no lower extremity edema. Hair:  thin      Latest Ref Rng & Units 06/14/2022    2:45 PM 12/30/2020    8:15 PM 08/18/2020    3:20 PM  CMP  Glucose 70 - 99 mg/dL 98  96  77   BUN 6 - 24 mg/dL 17  25  12    Creatinine 0.57 - 1.00 mg/dL 0.89  0.79  0.78   Sodium 134 - 144 mmol/L 141  141  142   Potassium 3.5 - 5.2 mmol/L 3.9  4.3  4.1   Chloride 96 - 106 mmol/L 101  103  104   CO2 20 - 29 mmol/L 19  26  26    Calcium 8.7 - 10.2 mg/dL 9.9  10.3  9.6   Total Protein 6.0 - 8.5 g/dL 7.1  8.0  6.7   Total Bilirubin 0.0 - 1.2 mg/dL 0.6  0.8  0.4   Alkaline Phos 44 - 121 IU/L 130  96  114   AST 0 - 40 IU/L 22  20  24    ALT 0 - 32 IU/L 8  11  13     Lipid Panel  No results found for: "CHOL", "TRIG", "HDL", "CHOLHDL", "VLDL", "LDLCALC", "LDLDIRECT"  CBC    Component Value Date/Time   WBC 5.5 06/14/2022 1445   WBC 5.8 12/30/2020 2015   RBC 4.18 06/14/2022 1445   RBC 4.71 12/30/2020 2015   HGB 12.2 06/14/2022 1445   HCT 38.0 06/14/2022 1445   PLT 269 06/14/2022 1445   MCV 91 06/14/2022 1445   MCH 29.2 06/14/2022 1445   MCH 29.5 12/30/2020 2015   MCHC 32.1 06/14/2022  1445   MCHC 32.9 12/30/2020 2015   RDW 11.8 06/14/2022 1445   LYMPHSABS 2.0 12/30/2020 2015   MONOABS 0.4 12/30/2020 2015   EOSABS 0.1 12/30/2020 2015   BASOSABS 0.0 12/30/2020 2015    ASSESSMENT AND PLAN:  1. Mild protein-calorie malnutrition ([place]) Commended on 7 lb wgh gain.  Encouraged to continue smaller but more frequent meals if unable to tolerate 3 large meals a day  2. Hair loss Message sent to our referral coordinator requesting that referral be diverted to a dermatologist that takes her insurance.  3. Influenza vaccination declined Recommended.  Patient declined.  4. Encounter for screening mammogram for malignant neoplasm of breast Will be due for mammogram in January of next year. - MM Digital Screening; Future  5. Chronic incomplete quadriplegia (HCC) Stable.  Followed by PMR. Continue Vesicare and Baclofen    Patient was given the opportunity to ask questions.  Patient verbalized understanding of the plan and was able to repeat key elements of the plan.   This documentation was completed using Radio producer.  Any transcriptional errors are unintentional.  No orders of the defined types were placed in this encounter.    Requested Prescriptions    No prescriptions requested or ordered in this encounter    No follow-ups on file.  Karle Plumber, MD, FACP

## 2022-08-23 ENCOUNTER — Telehealth: Payer: Self-pay | Admitting: Emergency Medicine

## 2022-08-23 ENCOUNTER — Telehealth: Payer: Self-pay

## 2022-08-23 DIAGNOSIS — Z0100 Encounter for examination of eyes and vision without abnormal findings: Secondary | ICD-10-CM

## 2022-08-23 NOTE — Telephone Encounter (Signed)
Can you re open her Dermatology referral and send it to somewhere that her insurance covers.

## 2022-08-23 NOTE — Telephone Encounter (Signed)
Copied from [place]. Topic: Referral - Request for Referral >> Aug 22, 2022  4:50 PM Sabas Sous wrote: Has patient seen PCP for this complaint? No. *If NO, is insurance requiring patient see PCP for this issue before PCP can refer them? Referral for which specialty: Opthalmology Optometry  Preferred provider/office: Highest recommended in network with insurance  Reason for referral: Wants an exam to see if she needs glasses

## 2022-08-23 NOTE — Addendum Note (Signed)
Addended by: Karle Plumber B on: 08/23/2022 12:12 PM   Modules accepted: Orders

## 2022-08-26 ENCOUNTER — Telehealth: Payer: Self-pay | Admitting: Internal Medicine

## 2022-08-26 NOTE — Telephone Encounter (Signed)
-----   Message from Ena Dawley sent at 08/26/2022 10:57 AM EST ----- Regarding: RE: Derm referral Sent Referral to [facility] Dr Mickel Fuchs  Ph. # [phone] address [address] [place], [place] [address] ([place]). They will contact the patient to schedule in [facility] or [place]     ----- Message ----- From: Ladell Pier, MD Sent: 08/22/2022   5:48 PM EST To: Ena Dawley Subject: Derm referral                                  [facility] dermatology did not take her insurance.  Can you try sending it to someone else who takes her insurance?

## 2022-08-28 NOTE — Telephone Encounter (Signed)
Pt was called and informed of referral being placed. ?

## 2022-08-29 ENCOUNTER — Other Ambulatory Visit: Payer: Self-pay

## 2022-08-29 ENCOUNTER — Telehealth: Payer: Self-pay

## 2022-08-29 ENCOUNTER — Encounter (HOSPITAL_COMMUNITY): Payer: Self-pay

## 2022-08-29 ENCOUNTER — Emergency Department (HOSPITAL_COMMUNITY)
Admission: EM | Admit: 2022-08-29 | Discharge: 2022-08-29 | Disposition: A | Discharge status: Home / Self Care | Payer: Medicare (Managed Care) | Attending: Emergency Medicine | Admitting: Emergency Medicine

## 2022-08-29 DIAGNOSIS — S39012A Strain of muscle, fascia and tendon of lower back, initial encounter: Secondary | ICD-10-CM | POA: Insufficient documentation

## 2022-08-29 DIAGNOSIS — X58XXXA Exposure to other specified factors, initial encounter: Secondary | ICD-10-CM | POA: Diagnosis not present

## 2022-08-29 DIAGNOSIS — S3992XA Unspecified injury of lower back, initial encounter: Secondary | ICD-10-CM | POA: Diagnosis present

## 2022-08-29 MED ORDER — IBUPROFEN 600 MG PO TABS: 600.0000 mg | ORAL_TABLET | Freq: Four times a day (QID) | ORAL | 0 refills | Status: DC | PRN

## 2022-08-29 MED ORDER — IBUPROFEN 400 MG PO TABS
600.0000 mg | ORAL_TABLET | Freq: Once | ORAL | Status: AC
  Administered 2022-08-29: 600 mg via ORAL
  Filled 2022-08-29: qty 1

## 2022-08-29 MED ORDER — LIDOCAINE 5 % EX PTCH: 1.0000 | MEDICATED_PATCH | CUTANEOUS | 0 refills | Status: DC

## 2022-08-29 MED ORDER — LIDOCAINE 5 % EX PTCH
1.0000 | MEDICATED_PATCH | CUTANEOUS | Status: DC
  Administered 2022-08-29: 1 via TRANSDERMAL
  Filled 2022-08-29 (×2): qty 1

## 2022-08-29 MED ORDER — CYCLOBENZAPRINE HCL 10 MG PO TABS: 10.0000 mg | ORAL_TABLET | Freq: Two times a day (BID) | ORAL | 0 refills | Status: DC | PRN

## 2022-08-29 NOTE — ED Triage Notes (Signed)
Pt c/o lumbosacral pain that started on 11/7 while she was standing and cooking. Pt states the pain radiates down to R buttocks.

## 2022-08-29 NOTE — ED Notes (Signed)
Pt provided discharge instructions and prescription information. Pt was given the opportunity to ask questions and questions were answered.   

## 2022-08-29 NOTE — Discharge Instructions (Addendum)
Note your work-up today was overall reassuring.  As discussed, take ibuprofen every 6 hours and must monitor as needed.  As discussed, muscle laxer can cause drowsiness so please do not drive while taking medicine and to utilize its effects on you.  Also recommend numbing patches over-the-counter.  Recommend reevaluation by PCP in 3 to 5 days.  Please not hesitate to return to emergency department for worrisome signs symptoms we discussed, parent.

## 2022-08-29 NOTE — ED Provider Notes (Signed)
[facility] EMERGENCY DEPARTMENT Provider Note   CSN: [id number] Arrival date & time: 08/29/22  1308     History  Chief Complaint  Patient presents with   Back Pain    Anna Campos is a 56 y.o. female.   Back Pain   56 year old female presents emergency department with complaints of right lower back pain.  Patient states that pain began on Tuesday morning after sleeping in a recliner all night Monday night.  Patient states she usually sleeps in the bed but decided to sleep in a clinic for 1 night.  She has not taken any medicine for this.  Denies saddle anesthesia, bowel/bladder dysfunction, weakness/sensory deficits in lower extremities, history of IV drug use, known malignancy, prolonged steroid use.  Patient denies any repeat trauma.  Past medical history significant for syncope, pulmonary embolism  Home Medications Prior to Admission medications   Medication Sig Start Date End Date Taking? Authorizing Provider  cyclobenzaprine (FLEXERIL) 10 MG tablet Take 1 tablet (10 mg total) by mouth 2 (two) times daily as needed for muscle spasms. 08/29/22  Yes Sherian Maroon A, PA  ibuprofen (ADVIL) 600 MG tablet Take 1 tablet (600 mg total) by mouth every 6 (six) hours as needed. 08/29/22  Yes Sherian Maroon A, PA  lidocaine (LIDODERM) 5 % Place 1 patch onto the skin daily. Remove & Discard patch within 12 hours or as directed by MD 08/29/22  Yes Peter Garter, PA  acetaminophen (TYLENOL) 325 MG tablet Take 2 tablets (650 mg total) by mouth every 4 (four) hours as needed for mild pain (temp > 101.5). 02/04/20   Angiulli, Mcarthur Rossetti, PA-C  baclofen (LIORESAL) 20 MG tablet Take 1 tablet (20 mg total) by mouth 4 (four) times daily. For spasticity 07/26/22   Lovorn, Aundra Millet, MD  loratadine (CLARITIN) 10 MG tablet TAKE 1 TABLET (10 MG TOTAL) BY MOUTH DAILY. 08/28/20 08/28/21  Marcine Matar, MD  metoCLOPramide (REGLAN) 10 MG tablet Take 1 tablet (10 mg total) by mouth every 6  (six) hours. Patient not taking: Reported on 08/22/2022 12/30/20   Mare Ferrari, PA-C  Multiple Vitamin (MULTIVITAMIN WITH MINERALS) TABS tablet Take 1 tablet by mouth daily.    [provider]  solifenacin (VESICARE) 5 MG tablet Take 1 tablet (5 mg total) by mouth daily. 07/26/22   Lovorn, Aundra Millet, MD      Allergies    Ampicillin and Chlorhexidine    Review of Systems   Review of Systems  Musculoskeletal:  Positive for back pain.  All other systems reviewed and are negative.   Physical Exam Updated Vital Signs BP 106/71 (BP Location: Left Arm)   Pulse 71   Temp 98 F (36.7 C) (Oral)   Resp 17   Ht 5\' 8"  (1.727 m)   Wt 51.3 kg   SpO2 98%   BMI 17.18 kg/m  Physical Exam Vitals and nursing note reviewed.  Constitutional:      General: She is not in acute distress.    Appearance: She is well-developed.  HENT:     Head: Normocephalic and atraumatic.  Eyes:     Conjunctiva/sclera: Conjunctivae normal.  Cardiovascular:     Rate and Rhythm: Normal rate and regular rhythm.     Heart sounds: No murmur heard. Pulmonary:     Effort: Pulmonary effort is normal. No respiratory distress.     Breath sounds: Normal breath sounds.  Abdominal:     Palpations: Abdomen is soft.  Tenderness: There is no abdominal tenderness.  Musculoskeletal:        General: No swelling.     Cervical back: Neck supple.     Comments: No midline tenderness to palpation of cervical, thoracic, lumbar spine with no obvious step-off or deformity.  Paraspinal tenderness in the lumbar region with muscle spasm when compared to the left.  Muscle strength out of 5 lower extremities.  No sensory deficits along major nerve distributions lower extremities.  DTR symmetric and equal bilaterally.  Pedal pulses full intact bilaterally.  Skin:    General: Skin is warm and dry.     Capillary Refill: Capillary refill takes less than 2 seconds.     Comments: No overlying skin abnormalities noted.  Neurological:      Mental Status: She is alert.  Psychiatric:        Mood and Affect: Mood normal.     ED Results / Procedures / Treatments   Labs (all labs ordered are listed, but only abnormal results are displayed) Labs Reviewed - No data to display  EKG None  Radiology No results found.  Procedures Procedures    Medications Ordered in ED Medications  lidocaine (LIDODERM) 5 % 1 patch (1 patch Transdermal Patch Applied 08/29/22 1425)  ibuprofen (ADVIL) tablet 600 mg (600 mg Oral Given 08/29/22 1424)    ED Course/ Medical Decision Making/ A&P                           Medical Decision Making Risk Prescription drug management.   This patient presents to the ED for concern of back pain, this involves an extensive number of treatment options, and is a complaint that carries with it a high risk of complications and morbidity.  The differential diagnosis includes The emergent differential diagnosis for back pain includes but is not limited to fracture, muscle strain, cauda equina, spinal stenosis. DDD, ankylosing spondylitis, acute ligamentous injury, disk herniation, spondylolisthesis, Epidural compression syndrome, metastatic cancer, transverse myelitis, vertebral osteomyelitis, diskitis, kidney stone, pyelonephritis, AAA, Perforated ulcer, Retrocecal appendicitis, pancreatitis, bowel obstruction, retroperitoneal hemorrhage or mass, meningitis.  Co morbidities that complicate the patient evaluation  See HPI   Additional history obtained:  Additional history obtained from EMR External records from outside source obtained and reviewed including hospital records   Lab Tests:  N/a   Imaging Studies ordered:  N/a   Cardiac Monitoring: / EKG:  The patient was maintained on a cardiac monitor.  I personally viewed and interpreted the cardiac monitored which showed an underlying rhythm of: Sinus rhythm   Consultations Obtained:  N/a   Problem List / ED Course / Critical  interventions / Medication management  Back pain I ordered medication including ibuprofen and Lidoderm patch   Reevaluation of the patient after these medicines showed that the patient improved I have reviewed the patients home medicines and have made adjustments as needed   Social Determinants of Health:  Denies tobacco, illicit drug use   Test / Admission - Considered:  Back pain Vitals signs within normal range and stable throughout visit. Patient symptoms likely secondary to lumbar strain versus muscular spasm given area of tenderness and reproducibility on exam.  Doubt cauda equina, spinal epidural abscess, transverse myelitis given lack of red flag signs and history and reassuring physical exam.  Recommended symptomatic therapy at home with NSAIDs, Lidoderm patch and massage therapy/physical therapy for patient's symptoms.  Muscle laxer was prescribed to use as needed with caution  given side effect profile.  Treatment plan discussed with patient she acknowledged understand was agreeable to said plan. Worrisome signs and symptoms were discussed with the patient, and the patient acknowledged understanding to return to the ED if noticed. Patient was stable upon discharge.          Final Clinical Impression(s) / ED Diagnoses Final diagnoses:  Strain of lumbar region, initial encounter    Rx / DC Orders ED Discharge Orders          Ordered    ibuprofen (ADVIL) 600 MG tablet  Every 6 hours PRN        08/29/22 1413    cyclobenzaprine (FLEXERIL) 10 MG tablet  2 times daily PRN        08/29/22 1413    lidocaine (LIDODERM) 5 %  Every 24 hours        08/29/22 1417              Peter Garter, [place] 08/29/22 Mallie Snooks    Benjiman Core, MD 08/30/22 [phone]

## 2022-09-04 NOTE — Telephone Encounter (Signed)
Have attempted ot call pt 4x- havne't heard back- also left her a message to call back to give me times she's available- thanks- ML

## 2022-09-06 ENCOUNTER — Ambulatory Visit: Payer: Medicare (Managed Care) | Attending: Internal Medicine

## 2022-09-11 ENCOUNTER — Telehealth: Payer: Self-pay | Admitting: Internal Medicine

## 2022-09-11 NOTE — Telephone Encounter (Signed)
FYI

## 2022-09-11 NOTE — Telephone Encounter (Signed)
Copied from CRM [id number]. Topic: General - Inquiry >> Sep 11, 2022 11:23 AM De Blanch wrote: Reason for CRM: Morrie Sheldon from [facility] called in and stated they received a referral for pt however, she reached out to pt and the pt advised her she already had an appointment elsewhere.  FYI for office.

## 2022-10-10 ENCOUNTER — Other Ambulatory Visit: Payer: Self-pay

## 2022-10-10 ENCOUNTER — Ambulatory Visit: Payer: Medicare (Managed Care) | Attending: Internal Medicine | Admitting: Internal Medicine

## 2022-10-10 ENCOUNTER — Encounter: Payer: Self-pay | Admitting: Internal Medicine

## 2022-10-10 VITALS — BP 107/72 | HR 104 | Temp 98.1°F | Ht 68.0 in | Wt 117.6 lb

## 2022-10-10 DIAGNOSIS — R636 Underweight: Secondary | ICD-10-CM | POA: Diagnosis not present

## 2022-10-10 DIAGNOSIS — G8250 Quadriplegia, unspecified: Secondary | ICD-10-CM

## 2022-10-10 DIAGNOSIS — Z0001 Encounter for general adult medical examination with abnormal findings: Secondary | ICD-10-CM | POA: Diagnosis not present

## 2022-10-10 DIAGNOSIS — Z7189 Other specified counseling: Secondary | ICD-10-CM

## 2022-10-10 DIAGNOSIS — Z0000 Encounter for general adult medical examination without abnormal findings: Secondary | ICD-10-CM

## 2022-10-10 DIAGNOSIS — Z23 Encounter for immunization: Secondary | ICD-10-CM

## 2022-10-10 MED ORDER — CYCLOBENZAPRINE HCL 10 MG PO TABS
10.0000 mg | ORAL_TABLET | Freq: Two times a day (BID) | ORAL | 2 refills | Status: DC | PRN
  Filled 2022-10-10: qty 60, 30d supply, fill #0

## 2022-10-10 NOTE — Patient Instructions (Signed)
Try to increase your intake to 3 meals a day. If you decide to execute a living will or healthcare power of attorney, please bring a copy for our records. You can get the COVID booster from any outside pharmacy.

## 2022-10-10 NOTE — Progress Notes (Signed)
Medicare wellness

## 2022-10-10 NOTE — Progress Notes (Signed)
Subjective:    Anna Campos is a 56 y.o. female who presents for a Welcome to Medicare exam.  Patient with history of incomplete quadriplegia at C5-6 level, neurogenic bowel, neurogenic bladder, nerve pain, anemia, hypotension (was on Florinef), gastric ulcers, PE in the setting of acute illness, HL, genital herpes.    Review of Systems Gen: Weight is up 4 pounds since last visit.  She tells me that she eats 2 meals a day and drinks about 2 cans of Ensure MSK: Patient is requesting refill on cyclobenzaprine.  States that this works better for her spasms and is better tolerated than the baclofen.  She states that baclofen causes ringing in her ears and she thinks may cause intermittent rash on her arms.  Cardiac Risk Factors include: none      Objective:    Today's Vitals   10/10/22 1440  BP: 107/72  Pulse: (!) 104  Temp: 98.1 F (36.7 C)  TempSrc: Oral  SpO2: 100%  Weight: 117 lb 9.6 oz (53.3 kg)  Height: 5\' 8"  (1.727 m)  PainSc: 6   Body mass index is 17.88 kg/m. Wt Readings from Last 3 Encounters:  10/10/22 117 lb 9.6 oz (53.3 kg)  08/29/22 113 lb (51.3 kg)  08/22/22 113 lb 12.8 oz (51.6 kg)  Physical Exam   General: Middle-aged African-American female in NAD.  She has temporal wasting. Neck: No cervical or axillary lymphadenopathy.  No thyroid enlargement. Chest: Clear to auscultation bilaterally. CVS: Regular rate rhythm.  No gallops or murmurs heard. MSK: She ambulates with a cane. Medications Outpatient Encounter Medications as of 10/10/2022  Medication Sig   acetaminophen (TYLENOL) 325 MG tablet Take 2 tablets (650 mg total) by mouth every 4 (four) hours as needed for mild pain (temp > 101.5).   cyclobenzaprine (FLEXERIL) 10 MG tablet Take 1 tablet (10 mg total) by mouth 2 (two) times daily as needed for muscle spasms.   ibuprofen (ADVIL) 600 MG tablet Take 1 tablet (600 mg total) by mouth every 6 (six) hours as needed.   lidocaine (LIDODERM) 5 % Place 1 patch  onto the skin daily. Remove & Discard patch within 12 hours or as directed by MD   metoCLOPramide (REGLAN) 10 MG tablet Take 1 tablet (10 mg total) by mouth every 6 (six) hours.   Multiple Vitamin (MULTIVITAMIN WITH MINERALS) TABS tablet Take 1 tablet by mouth daily.   solifenacin (VESICARE) 5 MG tablet Take 1 tablet (5 mg total) by mouth daily.   baclofen (LIORESAL) 20 MG tablet Take 1 tablet (20 mg total) by mouth 4 (four) times daily. For spasticity (Patient not taking: Reported on 10/10/2022)   loratadine (CLARITIN) 10 MG tablet TAKE 1 TABLET (10 MG TOTAL) BY MOUTH DAILY.   No facility-administered encounter medications on file as of 10/10/2022.     History: Past Medical History:  Diagnosis Date   Blood transfusion without reported diagnosis 12/2019   Genital herpes    PE (pulmonary thromboembolism) (HCC) 12/20/2019   Syncope 12/20/2019   Past Surgical History:  Procedure Laterality Date   ANTERIOR CERVICAL DECOMPRESSION/DISCECTOMY FUSION 4 LEVELS N/A 01/04/2020   Procedure: CERVICAL THREE- FOUR, CERVICAL FOUR-FIVE, CERVICAL FIVE- SIX, CERVCAL SIX- SEVEN ANTERIOR CERVICAL DECOMPRESSION/DISCECTOMY FUSION;  Surgeon: 01/06/2020, MD;  Location: [facility] OR;  Service: Neurosurgery;  Laterality: N/A;  CERVICAL THREE- FOUR, CERVICAL FOUR-FIVE, CERVICAL FIVE- SIX, CERVCAL SIX- SEVEN ANTERIOR CERVICAL DECOMPRESSION/DISCECTOMY FUSION   CESAREAN SECTION     ESOPHAGOGASTRODUODENOSCOPY (EGD) WITH PROPOFOL N/A 12/27/2019  Procedure: ESOPHAGOGASTRODUODENOSCOPY (EGD) WITH PROPOFOL;  Surgeon: Kerin Salen, MD;  Location: [facility] ENDOSCOPY;  Service: Gastroenterology;  Laterality: N/A;  PLEASE ALERT ANESTHESIA TO THE FACT THAT THE PATIENT HAS SUSTAINED A NECK INJURY AND HAS A QUADRIPARESIS   HEMOSTASIS CLIP PLACEMENT  12/27/2019   Procedure: HEMOSTASIS CLIP PLACEMENT;  Surgeon: Kerin Salen, MD;  Location: [facility] ENDOSCOPY;  Service: Gastroenterology;;   MULTIPLE EXTRACTIONS WITH ALVEOLOPLASTY N/A 12/30/2019    Procedure: Extraction of tooth #'s 2-8, 11-14, and 20-28 with alveoloplasty and bilateral mandibular lingual tori reductions.;  Surgeon: Charlynne Pander, DDS;  Location: [facility] OR;  Service: Oral Surgery;  Laterality: N/A;   SPINE SURGERY      Family History  Problem Relation Age of Onset   Breast cancer Mother    Cancer Mother    Social History   Occupational History   Not on file  Tobacco Use   Smoking status: Former   Smokeless tobacco: Never  Vaping Use   Vaping Use: Never used  Substance and Sexual Activity   Alcohol use: Yes    Comment: OCCASIONAL   Drug use: Not Currently   Sexual activity: Yes    Birth control/protection: Post-menopausal    Tobacco Counseling Nonsmoker Drinks a glass of wine 2x/mth   Immunizations and Health Maintenance Immunization History  Administered Date(s) Administered   Influenza,inj,Quad PF,6+ Mos 08/18/2020, 08/23/2021   PFIZER(Purple Top)SARS-COV-2 Vaccination 02/10/2020, 03/03/2020   PNEUMOCOCCAL CONJUGATE-20 08/23/2021   Tdap 10/21/2014   Health Maintenance Due  Topic Date Due   Medicare Annual Wellness (AWV)  Never done   COVID-19 Vaccine (3 - Pfizer risk series) 03/31/2020    Activities of Daily Living    10/10/2022    2:43 PM 09/02/2022   10:12 AM  In your present state of health, do you have any difficulty performing the following activities:  Hearing? 0 0  Vision? 0 0  Difficulty concentrating or making decisions? 0 1  Walking or climbing stairs? 1 0  Dressing or bathing? 1 0  Doing errands, shopping? 1 1  Preparing Food and eating ? N N  Using the Toilet? N N  In the past six months, have you accidently leaked urine? N Y  Do you have problems with loss of bowel control? N Y  Managing your Medications? N N  Managing your Finances? N N  Housekeeping or managing your Housekeeping? N Y     Advanced Directives: Does Patient Have a Medical Advance Directive?: No Would patient like information on creating a medical  advance directive?: Yes (ED - Information included in AVS)    Assessment:    This is a routine wellness examination for this patient .   Vision/Hearing screen Hearing Screening   500Hz  1000Hz  2000Hz  4000Hz   Right ear Pass Pass Pass Pass  Left ear Pass Pass Pass Pass   Vision Screening   Right eye Left eye Both eyes  Without correction 20/25 20/20   With correction       Dietary issues and exercise activities discussed:  Current Exercise Habits: Home exercise routine, Type of exercise: strength training/weights, Time (Minutes): 20, Frequency (Times/Week): 7, Weekly Exercise (Minutes/Week): 140, Intensity: Mild, Exercise limited by: None identified Tries to eat 2 full meals a day and 1-2 cans Ensure a day.  Goals   None    Depression Screen    08/22/2022    2:33 PM 07/26/2022    2:31 PM 09/12/2021    9:26 AM 08/23/2021    1:35 PM  PHQ  2/9 Scores  PHQ - 2 Score 0 0 0 3  PHQ- 9 Score 2   8     Fall Risk    10/10/2022    2:43 PM  Fall Risk   Falls in the past year? 0  Number falls in past yr: 0  Injury with Fall? 0  Risk for fall due to : No Fall Risks    Cognitive Function:    10/10/2022    3:52 PM  MMSE - Mini Mental State Exam  Orientation to time 5  Orientation to Place 5  Registration 3  Attention/ Calculation 5  Recall 3  Language- name 2 objects 2  Language- repeat 1  Language- follow 3 step command 3  Language- read & follow direction 1  Write a sentence 1  Copy design 1  Total score 30        10/10/2022    2:45 PM  6CIT Screen  What Year? 0 points  What month? 0 points  What time? 0 points  Count back from 20 0 points  Months in reverse 0 points  Repeat phrase 0 points  Total Score 0 points    Patient Care Team: Marcine Matar, MD as PCP - General (Internal Medicine)     Plan:   1. Encounter for Medicare annual wellness exam   2. Need for influenza vaccination Given today  -Flu Vaccine QUAD 57mo+IM (Fluarix, Fluzone &  Alfiuria Quad PF)  3. Need for first booster dose of COVID-19 vaccine Recommends that she get a COVID booster at any outside pharmacy.  We do not have it available at our pharmacy.  4. Advance directive discussed with patient Discussed advanced directive including living will and healthcare power of attorney.  Patient given packet to take home to review.  Advised that if she executes a living will or healthcare power of attorney, she can bring a copy for our records.  5. Chronic incomplete quadriplegia (HCC) Discontinue baclofen. - cyclobenzaprine (FLEXERIL) 10 MG tablet; Take 1 tablet (10 mg total) by mouth 2 (two) times daily as needed for muscle spasms.  Dispense: 60 tablet; Refill: 2  6. Underweight Encouraged her to try to increase to 3 meals a day and continue Ensure to supplement meals.   I have personally reviewed and noted the following in the patient's chart:   Medical and social history Use of alcohol, tobacco or illicit drugs  Current medications and supplements Functional ability and status Nutritional status Physical activity Advanced directives List of other physicians Hospitalizations, surgeries, and ER visits in previous 12 months Vitals Screenings to include cognitive, depression, and falls Referrals and appointments  In addition, I have reviewed and discussed with patient certain preventive protocols, quality metrics, and best practice recommendations. A written personalized care plan for preventive services as well as general preventive health recommendations were provided to patient.     Jonah Blue, MD 10/10/2022

## 2022-10-16 ENCOUNTER — Telehealth: Payer: Self-pay | Admitting: Emergency Medicine

## 2022-10-16 NOTE — Telephone Encounter (Signed)
Copied from CRM [id number]. Topic: General - Other >> Oct 16, 2022 11:43 AM Franchot Heidelberg wrote: Reason for CRM: Pt called requesting to have her health information from this year submitted to her insurance. She says they need her CPE visit info, flu shot record, mammogram.    She says she needs this sent to [facility].

## 2022-10-18 NOTE — Telephone Encounter (Signed)
Called & spoke to the patient. Verified name & DOB. Advised patient to come in to the office to sign a medical release of records to have that information sent to [facility]. Patient expressed understanding.

## 2022-11-14 ENCOUNTER — Ambulatory Visit: Payer: Medicare (Managed Care) | Attending: Physician Assistant | Admitting: Physician Assistant

## 2022-11-14 ENCOUNTER — Other Ambulatory Visit (HOSPITAL_COMMUNITY)
Admission: RE | Admit: 2022-11-14 | Discharge: 2022-11-14 | Disposition: A | Discharge status: Home / Self Care | Payer: Medicare (Managed Care) | Source: Ambulatory Visit | Attending: Physician Assistant | Admitting: Physician Assistant

## 2022-11-14 ENCOUNTER — Encounter: Payer: Self-pay | Admitting: Physician Assistant

## 2022-11-14 VITALS — BP 98/67 | HR 85 | Wt 116.4 lb

## 2022-11-14 DIAGNOSIS — Z01419 Encounter for gynecological examination (general) (routine) without abnormal findings: Secondary | ICD-10-CM | POA: Diagnosis not present

## 2022-11-14 DIAGNOSIS — Z124 Encounter for screening for malignant neoplasm of cervix: Secondary | ICD-10-CM

## 2022-11-14 DIAGNOSIS — Z1151 Encounter for screening for human papillomavirus (HPV): Secondary | ICD-10-CM | POA: Insufficient documentation

## 2022-11-14 NOTE — Progress Notes (Signed)
Patient ID: Anna Campos, female   DOB: 27-Oct-1965, 57 y.o.   MRN: [id number]   Anna Campos, is a 57 y.o. female  [id number]:[id number]  [id number]  DOB - [birth year]  Chief Complaint  Patient presents with   Gynecologic Exam       Subjective:   Anna Campos is a 57 y.o. female here today for pap only.  She does want to be tested for bacteria or yeast.  He has never had an abnormal pap.  It has been years since she has had a period  No problems updated.  ALLERGIES: Allergies  Allergen Reactions   Ampicillin Diarrhea   Baclofen Other (See Comments)    Ringing in the ears   Chlorhexidine     PAST MEDICAL HISTORY: Past Medical History:  Diagnosis Date   Blood transfusion without reported diagnosis 12/2019   Genital herpes    PE (pulmonary thromboembolism) ([place]) 12/20/2019   Syncope 12/20/2019    MEDICATIONS AT HOME: Prior to Admission medications   Medication Sig Start Date End Date Taking? Authorizing Provider  acetaminophen (TYLENOL) 325 MG tablet Take 2 tablets (650 mg total) by mouth every 4 (four) hours as needed for mild pain (temp > 101.5). 02/04/20  Yes Angiulli, Lavon Paganini, PA-C  cyclobenzaprine (FLEXERIL) 10 MG tablet Take 1 tablet (10 mg total) by mouth 2 (two) times daily as needed for muscle spasms. 10/10/22  Yes Ladell Pier, MD  ibuprofen (ADVIL) 600 MG tablet Take 1 tablet (600 mg total) by mouth every 6 (six) hours as needed. 08/29/22  Yes Dion Saucier A, PA  lidocaine (LIDODERM) 5 % Place 1 patch onto the skin daily. Remove & Discard patch within 12 hours or as directed by MD 08/29/22  Yes Wilnette Kales, PA  metoCLOPramide (REGLAN) 10 MG tablet Take 1 tablet (10 mg total) by mouth every 6 (six) hours. 12/30/20  Yes Garald Balding, PA-C  Multiple Vitamin (MULTIVITAMIN WITH MINERALS) TABS tablet Take 1 tablet by mouth daily.   Yes [provider]  solifenacin (VESICARE) 5 MG tablet Take 1 tablet (5 mg total) by mouth daily. 07/26/22  Yes Lovorn,  Jinny Blossom, MD  loratadine (CLARITIN) 10 MG tablet TAKE 1 TABLET (10 MG TOTAL) BY MOUTH DAILY. 08/28/20 08/28/21  Ladell Pier, MD    ROS: Neg HEENT Neg resp Neg cardiac Neg GI Neg GU Neg MS Neg psych Neg neuro  Objective:   Vitals:   11/14/22 1404  BP: 98/67  Pulse: 85  SpO2: 100%  Weight: 116 lb 6.4 oz (52.8 kg)   Exam General appearance : Awake, alert, not in any distress. Speech Clear. Not toxic looking HEENT: Atraumatic and Normocephalic Neck: Supple, no JVD. No cervical lymphadenopathy.  Chest: Good air entry bilaterally, CTAB.  No rales/rhonchi/wheezing CVS: S1 S2 regular, no murmurs.  GU: external genitalia WNL.  Speculum inserted.  Nulliparous cervix.  Pap and swab taken Extremities: B/L Lower Ext shows no edema, both legs are warm to touch Neurology: Awake alert, and oriented X 3, CN II-XII intact, Non focal Skin: No Rash  Data Review Lab Results  Component Value Date   HGBA1C 5.0 12/19/2019    Assessment & Plan   1. Screening for cervical cancer - Cytology - PAP([place]) - Cervicovaginal ancillary only    Return for keep next appt with Dr Wynetta Emery in May 2024.  The patient was given clear instructions to go to ER or return to medical center if symptoms don't improve, worsen or new  problems develop. The patient verbalized understanding. The patient was told to call to get lab results if they haven't heard anything in the next week.      Freeman Caldron, PA-C [facility] and [place], [place]   11/14/2022, 2:20 PM

## 2022-11-15 ENCOUNTER — Encounter
Payer: Medicare (Managed Care) | Attending: Physical Medicine and Rehabilitation | Admitting: Physical Medicine and Rehabilitation

## 2022-11-15 ENCOUNTER — Encounter: Payer: Self-pay | Admitting: Physical Medicine and Rehabilitation

## 2022-11-15 ENCOUNTER — Ambulatory Visit: Payer: No Typology Code available for payment source

## 2022-11-15 VITALS — BP 104/73 | HR 87 | Wt 114.0 lb

## 2022-11-15 DIAGNOSIS — R269 Unspecified abnormalities of gait and mobility: Secondary | ICD-10-CM

## 2022-11-15 DIAGNOSIS — G8250 Quadriplegia, unspecified: Secondary | ICD-10-CM | POA: Diagnosis not present

## 2022-11-15 DIAGNOSIS — R252 Cramp and spasm: Secondary | ICD-10-CM | POA: Diagnosis not present

## 2022-11-15 LAB — CERVICOVAGINAL ANCILLARY ONLY
Bacterial Vaginitis (gardnerella): POSITIVE — AB
Candida Glabrata: NEGATIVE
Candida Vaginitis: NEGATIVE
Chlamydia: NEGATIVE
Comment: NEGATIVE
Comment: NEGATIVE
Comment: NEGATIVE
Comment: NEGATIVE
Comment: NEGATIVE
Comment: NORMAL
Neisseria Gonorrhea: NEGATIVE
Trichomonas: NEGATIVE

## 2022-11-15 NOTE — Patient Instructions (Signed)
Patient is a 57 yr old female with incomplete Quadriplegia- C5 ASIA D due to osteomyelitis  with neurogenic bowel and bladder and spasticity as well as chronic nerve pain. Also has some depression due to her hx as well as orthostatic hypotension- off Midodrine and Florinef- but BP still low! .  February 2021. Also has constipation and bowel accidents. Cannot work due to severe spasticity and incomplete quadriplegia.   Here for f/u on SCI  Willing to try Botox or botulinum toxin for LE spasticity- is equal and mainly in knee flexion - 400 units B/L LE"s-  2. Had severe abd pain and incontinence with dantrolene.   3. Had ringing in ears with Baclofen - feels like it was somewhat helpful, but "not completely"- I explained might still need baclofen once gets Botox- will re-eval afterwards.   4. Cannot do Zanaflex, because it can lower BP and hers usually runs 86H-683F systolic-   5. So really Baclofen is only answer other than Botox.   6. Pt off Vesicare  7. F/U with Dr Lanier Clam Letta Pate for botox of LE's 400 units B/L LE's.  8. F/U with me in 2-3 months to f/u on Botox/f/u on spasticity?SCI- double appt- SCI

## 2022-11-15 NOTE — Progress Notes (Signed)
Subjective:    Patient ID: Anna Campos, female    DOB: [birth year], 57 y.o.   MRN: [id number]  HPI  Patient is a 57 yr old female with incomplete Quadriplegia- C5 ASIA D due to osteomyelitis  with neurogenic bowel and bladder and spasticity as well as chronic nerve pain. Also has some depression due to her hx as well as orthostatic hypotension- off Midodrine and Florinef- but BP still low! .  February 2021. Also has constipation and bowel accidents. Cannot work due to severe spasticity and incomplete quadriplegia.   Here for f/u on SCI  Has mammogram Monday and breast exam.    Has dentures- looking great.    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   Did get disability- and was renewed.   Bladder-  learned not to drink at night- gets up 1x/night now Not taking Vesicare- it helped, because causing her hair falling she thinks.  Also knows menopause.  Baclofen Spasticity  Went to hospital since saw her last.  Hurting from back to butt- given some flexeril- took it and helped muscle spasms in back but hasn't helped the spasms/tightness  Doesn't feel like less stiff since came off Baclofen all the time- helped "sometimes" and "somewhat" but didn't go away.  Also had ear ringing on baclofen- which has improved off baclofen.        Pain Inventory Average Pain 2 Pain Right Now 2 My pain is aching and throbbing  LOCATION OF PAIN  back, arm  BOWEL Number of stools per week: 3-4   BLADDER Normal    Mobility use a cane ability to climb steps?  yes do you drive?  yes  Function Do you have any goals in this area?  no  Neuro/Psych spasms dizziness  Prior Studies Any changes since last visit?  no  Physicians involved in your care Any changes since last visit?  no   Family History  Problem Relation Age of Onset   Breast cancer Mother    Cancer Mother    Social History   Socioeconomic History   Marital  status: Married    Spouse name: Not on file   Number of children: 1   Years of education: Not on file   Highest education level: Some college, no degree  Occupational History   Not on file  Tobacco Use   Smoking status: Former   Smokeless tobacco: Never  Building services engineer Use: Never used  Substance and Sexual Activity   Alcohol use: Yes    Comment: OCCASIONAL   Drug use: Not Currently   Sexual activity: Yes    Birth control/protection: Post-menopausal  Other Topics Concern   Not on file  Social History Narrative   Not on file   Social Determinants of Health   Financial Resource Strain: Low Risk  (10/10/2022)   Overall Financial Resource Strain (CARDIA)    Difficulty of Paying Living Expenses: Not hard at all  Food Insecurity: No Food Insecurity (10/23/2021)   Hunger Vital Sign    Worried About Running Out of Food in the Last Year: Never true    Ran Out of Food in the Last Year: Never true  Transportation Needs: No Transportation Needs (10/23/2021)   PRAPARE - Administrator, Civil Service (Medical): No    Lack of Transportation (Non-Medical): No  Physical Activity: Insufficiently Active (10/10/2022)   Exercise Vital Sign    Days of Exercise  per Week: 7 days    Minutes of Exercise per Session: 20 min  Stress: No Stress Concern Present (10/10/2022)   [place]    Feeling of Stress : Not at all  Social Connections: Moderately Isolated (10/10/2022)   Social Connection and Isolation Panel [NHANES]    Frequency of Communication with Friends and Family: More than three times a week    Frequency of Social Gatherings with Friends and Family: Three times a week    Attends Religious Services: Never    Active Member of Clubs or Organizations: No    Attends Music therapist: Never    Marital Status: Married   Past Surgical History:  Procedure Laterality Date   ANTERIOR CERVICAL  DECOMPRESSION/DISCECTOMY FUSION 4 LEVELS N/A 01/04/2020   Procedure: CERVICAL THREE- FOUR, CERVICAL FOUR-FIVE, CERVICAL FIVE- SIX, CERVCAL SIX- SEVEN ANTERIOR CERVICAL DECOMPRESSION/DISCECTOMY FUSION;  Surgeon: Earnie Larsson, MD;  Location: [facility];  Service: Neurosurgery;  Laterality: N/A;  CERVICAL THREE- FOUR, CERVICAL FOUR-FIVE, CERVICAL FIVE- SIX, CERVCAL SIX- SEVEN ANTERIOR CERVICAL DECOMPRESSION/DISCECTOMY FUSION   CESAREAN SECTION     ESOPHAGOGASTRODUODENOSCOPY (EGD) WITH PROPOFOL N/A 12/27/2019   Procedure: ESOPHAGOGASTRODUODENOSCOPY (EGD) WITH PROPOFOL;  Surgeon: Ronnette Juniper, MD;  Location: [facility];  Service: Gastroenterology;  Laterality: N/A;  PLEASE ALERT ANESTHESIA TO THE FACT THAT THE PATIENT HAS SUSTAINED A NECK INJURY AND HAS A QUADRIPARESIS   HEMOSTASIS CLIP PLACEMENT  12/27/2019   Procedure: HEMOSTASIS CLIP PLACEMENT;  Surgeon: Ronnette Juniper, MD;  Location[facility] ENDOSCOPY;  Service: Gastroenterology;;   MULTIPLE EXTRACTIONS WITH ALVEOLOPLASTY N/A 12/30/2019   Procedure: Extraction of tooth #'s 2-8, 11-14, and 20-28 with alveoloplasty and bilateral mandibular lingual tori reductions.;  Surgeon: Lenn Cal, DDS;  Location: [facility];  Service: Oral Surgery;  Laterality: N/A;   SPINE SURGERY     Past Medical History:  Diagnosis Date   Blood transfusion without reported diagnosis 12/2019   Genital herpes    PE (pulmonary thromboembolism) ([facility]) 12/20/2019   Syncope 12/20/2019   Wt 114 lb (51.7 kg)   BMI 17.33 kg/m   Opioid Risk Score:   Fall Risk Score:  `1  Depression screen [facility] 2/9     11/15/2022    1:59 PM 11/14/2022    2:06 PM 08/22/2022    2:33 PM 07/26/2022    2:31 PM 09/12/2021    9:26 AM 08/23/2021    1:35 PM 12/01/2020    1:36 PM  Depression screen PHQ 2/9  Decreased Interest 0 0 0 0 0 3 1  Down, Depressed, Hopeless 0 0 0 0 0 0 1  PHQ - 2 Score 0 0 0 0 0 3 2  Altered sleeping   1   1   Tired, decreased energy   1   3   Change in appetite   0   0   Feeling  bad or failure about yourself    0   0   Trouble concentrating   0   1   Moving slowly or fidgety/restless   0   0   Suicidal thoughts   0   0   PHQ-9 Score   2   8       Review of Systems  Musculoskeletal:  Positive for back pain.       Arm pain  All other systems reviewed and are negative.     Objective:   Physical Exam Awake, alert, appropriate wearing dentures; using quad  cane ot walk; NAD; cachetic-  Very stiff legged gait due to spasticity  Neuro: UE no increased tone Les' MAS 3-4 esp in knee extension > knee flexion and some in hip flexion.  Clonus 5-6 beats RLE and 3-4 beats LLE  RS:WNIOEVO more in R hand than it was- esp 4th/5th digits Ue's RUE- biceps 4+/5; triceps 3+/5; WE 4+/5; grip 4/5; and FA 2/5 LUE- bieps 5-/5; WE 4+/5; triceps 4+/5; grip 4/5; FA 2/5 LLE- HF 4/5; KE 5-/5; DF, PF and EHL 5-/5 RLE- HF 4+/5; otherwise 5-/5    Weight up 2 lbs- to 114 lbs.         Assessment & Plan:   Patient is a 57 yr old female with incomplete Quadriplegia- C5 ASIA D due to osteomyelitis  with neurogenic bowel and bladder and spasticity as well as chronic nerve pain. Also has some depression due to her hx as well as orthostatic hypotension- off Midodrine and Florinef- but BP still low! .  February 2021. Also has constipation and bowel accidents. Cannot work due to severe spasticity and incomplete quadriplegia.   Here for f/u on SCI  Willing to try Botox or botulinum toxin for LE spasticity- is equal and mainly in knee flexion - 400 units B/L LE"s-  2. Had severe abd pain and incontinence with dantrolene.   3. Had ringing in ears with Baclofen - feels like it was somewhat helpful, but "not completely"- I explained might still need baclofen once gets Botox- will re-eval afterwards.   4. Cannot do Zanaflex, because it can lower BP and hers usually runs 35K-093G systolic-   5. So really Baclofen is only answer other than Botox.   6. Pt off Vesicare  7. F/U with Dr  Lanier Clam Letta Pate for botox of LE's 400 units B/L LE's.  8. F/U with me in 2-3 months to f/u on Botox/f/u on spasticity?SCI- double appt- SCI    I spent a total of  23  minutes on total care today- >50% coordination of care- due to discussion of options for Spasticity treatment.

## 2022-11-18 ENCOUNTER — Ambulatory Visit
Admission: RE | Admit: 2022-11-18 | Discharge: 2022-11-18 | Disposition: A | Discharge status: Home / Self Care | Payer: No Typology Code available for payment source | Source: Ambulatory Visit | Attending: Internal Medicine | Admitting: Internal Medicine

## 2022-11-18 DIAGNOSIS — Z1231 Encounter for screening mammogram for malignant neoplasm of breast: Secondary | ICD-10-CM | POA: Diagnosis not present

## 2022-11-20 ENCOUNTER — Telehealth: Payer: Self-pay

## 2022-11-20 ENCOUNTER — Telehealth: Payer: Self-pay | Admitting: Internal Medicine

## 2022-11-20 NOTE — Telephone Encounter (Signed)
Called & spoke to the Cytology department. Representative stated that the sample was checked by a technician and was then sent off to a pathologist for further testing. Orders were placed for HPV analysis and currently waiting on results.

## 2022-11-20 NOTE — Telephone Encounter (Signed)
Pt was called and is aware of results, DOB was confirmed.  ?

## 2022-11-20 NOTE — Telephone Encounter (Signed)
-----  Message from Argentina Donovan, [place] sent at 11/17/2022  8:19 PM EST ----- Will u please call patient and let her know her swabs showed bacterial vaginitis. No yeast, no std. Will you please order metronidazole 500mg  bid for 7 days and send it to her pharmacy? The order should come through for me to sign. Thanks, Freeman Caldron, PA-C Pap is not back yet.

## 2022-11-20 NOTE — Telephone Encounter (Signed)
Copied from [place] [id number]. Topic: General - Other >> Nov 20, 2022  9:46 AM Anna Campos wrote: Reason for CRM: The patient has called for the results of their pap on 11/14/22  Please contact the patient further when possible

## 2022-11-20 NOTE — Telephone Encounter (Signed)
Called & spoke to the patient. Verified name & DOB. Informed that we are still waiting on results from the lab and will call back with results. Advised patient to call back if there are any other questions or concerns. Patient expressed verbal understanding.

## 2022-11-21 ENCOUNTER — Other Ambulatory Visit: Payer: Self-pay | Admitting: Physician Assistant

## 2022-11-21 ENCOUNTER — Other Ambulatory Visit: Payer: Self-pay | Admitting: Internal Medicine

## 2022-11-21 ENCOUNTER — Telehealth: Payer: Self-pay

## 2022-11-21 LAB — CYTOLOGY - PAP
Comment: NEGATIVE
Diagnosis: NEGATIVE
Diagnosis: REACTIVE
High risk HPV: NEGATIVE

## 2022-11-21 MED ORDER — METRONIDAZOLE 500 MG PO TABS: 500.0000 mg | ORAL_TABLET | Freq: Two times a day (BID) | ORAL | 0 refills | Status: DC

## 2022-11-21 NOTE — Telephone Encounter (Signed)
-----  Message from [place] Donovan, [place] sent at 11/21/2022  8:35 AM EST ----- Your pap smear was normal.  There was an imbalance in the bacteria causing bacterial vaginitis.  This is not an STD and you were negative for yeast, gonorrhea, chlamydia, trichomonas.  I sent you a prescription [facility] bacterial vag[facility] to [facility].  Your next pap smear will be due in 3 to 5 years.  Dr Wynetta Emery (your PCP)will determine the appropriate interval for you.  Thanks, Freeman Caldron, PA-C

## 2022-11-21 NOTE — Telephone Encounter (Signed)
Copied from [place] [id number]. Topic: General - Other >> Nov 21, 2022  8:56 AM Everette C wrote: Reason for CRM: Medication Refill - Medication: metroNIDAZOLE (FLAGYL) 500 MG tablet [[id number]]  Has the patient contacted their pharmacy? Yes.   (Agent: If no, request that the patient contact the pharmacy for the refill. If patient does not wish to contact the pharmacy document the reason why and proceed with request.) (Agent: If yes, when and what did the pharmacy advise?)  Preferred Pharmacy (with phone number or street name): [place] ([place]), [place] - [address] [place] [address] Phone: [phone] Fax: [phone] Hours: Not open 24 hours   Has the patient been seen for an appointment in the last year OR does the patient have an upcoming appointment? Yes.    Agent: Please be advised that RX refills may take up to 3 business days. We ask that you follow-up with your pharmacy.

## 2022-11-21 NOTE — Telephone Encounter (Signed)
Pt was called and is aware of results, DOB was confirmed.  ?

## 2022-11-21 NOTE — Telephone Encounter (Signed)
Requested medication (s) are due for refill today: no  Requested medication (s) are on the active medication list: yes  Last refill:  11/21/22 #14  Future visit scheduled: yes  Notes to clinic:  med not assigned to a protocol- NT not delegated to refuse med   Requested Prescriptions  Pending Prescriptions Disp Refills   metroNIDAZOLE (FLAGYL) 500 MG tablet 14 tablet 0    Sig: Take 1 tablet (500 mg total) by mouth 2 (two) times daily.     Off-Protocol Failed - 11/21/2022  9:19 AM      Failed - Medication not assigned to a protocol, review manually.      Passed - Valid encounter within last 12 months    Recent Outpatient Visits           1 week ago Screening for cervical cancer   [place], [place]   1 month ago Encounter for [employer] annual wellness exam   [place] Karle Plumber B, MD   3 months ago Mild protein-calorie malnutrition [facility])   [place] Ladell Pier, MD   5 months ago Unintentional weight loss   [place] Ladell Pier, MD   1 year ago Annual physical exam   [place], [place]       Future Appointments             In 3 months Wynetta Emery Dalbert Batman, MD [place]   In 10 months Ladell Pier, MD [place]

## 2022-11-21 NOTE — Telephone Encounter (Signed)
Called patient and she is aware

## 2022-11-22 NOTE — Telephone Encounter (Signed)
Called & spoke to the patient. Verified name & DOB. Patient stated that she has received lab results for the PAP smear & picked up medication. No further questions at this time.

## 2022-12-02 ENCOUNTER — Telehealth: Payer: Self-pay | Admitting: Physical Medicine and Rehabilitation

## 2022-12-02 ENCOUNTER — Telehealth: Payer: Self-pay

## 2022-12-02 NOTE — Telephone Encounter (Signed)
Called pt back- she was concerned Botox was "poison"- explained we use a MUCH smaller dose to treat Spasticity Can also take baclofen until has Botox.   Pt voiced understanding.

## 2022-12-02 NOTE — Telephone Encounter (Signed)
Patient left voice mail asking Dr. Dagoberto Ligas to return her call.

## 2022-12-02 NOTE — Telephone Encounter (Signed)
Anna Campos called in today wants to know if you could give her a call to discuss botox.

## 2022-12-20 DIAGNOSIS — L659 Nonscarring hair loss, unspecified: Secondary | ICD-10-CM | POA: Diagnosis not present

## 2022-12-31 ENCOUNTER — Ambulatory Visit: Payer: Self-pay

## 2022-12-31 ENCOUNTER — Other Ambulatory Visit: Payer: Self-pay | Admitting: Pharmacist

## 2022-12-31 MED ORDER — IBUPROFEN 600 MG PO TABS: 600.0000 mg | ORAL_TABLET | Freq: Four times a day (QID) | ORAL | 0 refills | Status: DC | PRN

## 2022-12-31 NOTE — Telephone Encounter (Signed)
Summary: Back pain, Rx request   Pt called requesting ibuprofen (ADVIL) 600 MG tablet says her back is bothering her [facility] ([place]), [place] - [address] [place] ([place]) [place] [address] Phone: [phone] Fax: [phone]     Called pt - left message on machine to call back.

## 2022-12-31 NOTE — Telephone Encounter (Signed)
Summary: Back pain, Rx request   Pt called requesting ibuprofen (ADVIL) 600 MG tablet says her back is bothering her [place] ([place]), [place] - [address] [place] ([place]) [place] [address] Phone: [phone] Fax: [phone]      Called pt - Left message on machine to call back.

## 2022-12-31 NOTE — Telephone Encounter (Signed)
   Chief Complaint: Low, middle back pain. Asking for refill on Ibuprofen. Symptoms: Low back pain Frequency: This week Pertinent Negatives: Patient denies  Disposition: [] ED /[] Urgent Care (no appt availability in office) / [] Appointment(In office/virtual)/ []  [facility]/ [] Home Care/ [] Refused Recommended Disposition /[] [facility]/ [x]  Follow-up with PCP Additional Notes: Please advise pt.   Answer Assessment - Initial Assessment Questions 1. DRUG NAME: "What medicine do you need to have refilled?"     Ibuprofen 2. REFILLS REMAINING: "How many refills are remaining?" (Note: The label on the medicine or pill bottle will show how many refills are remaining. If there are no refills remaining, then a renewal may be needed.)     0 3. EXPIRATION DATE: "What is the expiration date?" (Note: The label states when the prescription will expire, and thus can no longer be refilled.)     N/a 4. PRESCRIBING HCP: "Who prescribed it?" Reason: If prescribed by specialist, call should be referred to that group.     Hospital doctor 5. SYMPTOMS: "Do you have any symptoms?"     Back pain - low back in middle 6. PREGNANCY: "Is there any chance that you are pregnant?" "When was your last menstrual period?"     No  Protocols used: Medication Refill and Renewal Call-A-AH

## 2023-02-12 ENCOUNTER — Encounter
Payer: Medicare (Managed Care) | Attending: Physical Medicine and Rehabilitation | Admitting: Physical Medicine and Rehabilitation

## 2023-02-12 VITALS — BP 110/77 | HR 80 | Ht 68.0 in | Wt 113.8 lb

## 2023-02-12 DIAGNOSIS — M792 Neuralgia and neuritis, unspecified: Secondary | ICD-10-CM | POA: Diagnosis not present

## 2023-02-12 DIAGNOSIS — R252 Cramp and spasm: Secondary | ICD-10-CM | POA: Insufficient documentation

## 2023-02-12 MED ORDER — SODIUM CHLORIDE (PF) 0.9 % IJ SOLN
4.0000 mL | Freq: Once | INTRAMUSCULAR | Status: AC
  Administered 2023-02-12: 4 mL via INTRAVENOUS

## 2023-02-12 MED ORDER — ONABOTULINUMTOXINA 100 UNITS IJ SOLR
400.0000 [IU] | Freq: Once | INTRAMUSCULAR | Status: AC
  Administered 2023-02-12: 400 [IU] via INTRAMUSCULAR

## 2023-02-12 NOTE — Progress Notes (Signed)
  Botolulinum Toxin Injection: [ x  ] BOTOX (onabotulinumtoxinA) [  ] DYSPORT (abobotulinumtoxinA) [  ] Xeomin   Goals with treatment: [x ] Decrease spasms/ abnormal movements [x ] Improve Active / Passive ROM [ ]  Improve ADLs [x ] Improve functional mobility [x ] Improve gait mechanics [x ] Improve positioning/posture [ x] Prevent contracture  [ ]  Prevent joint destruction [ ]  Prevent skin breakdown [ ]  Decrease caregiver burden [ ]  Improve hygiene [x ] Improve Pain  MEDICATION:  ONAbotulinum toxin. 400  Units   NaCl: 4 mLs    CONSENT: Obtained in writing followed by time-out per clinic policy. Consent uploaded to chart.  Benefits discussed included, but were not limited to, decreased muscle tightness and spasticity, increased joint range of motion, improved limb positioning and facilitation of hygiene and nursing care.   Risks discussed included, but were not limited to, pain and discomfort, bleeding, bruising, excessive weakness, venous thrombosis, muscle atrophy, and distant spread of toxin which could include generalized muscle weakness, diplopia, blurred vision, ptosis, dysphagia, dysphonia, dysarthria, urinary incontinence and breathing difficulties. These symptoms have been reported hours to weeks after injection. Swallowing and breathing difficulties may be life threatening, and there have been reports of death. Patient/Family member/Guardian/Caregiver have been offered botulinum toxin informational material upon initial consultation and this information has been continually available. All questions answered to patient/family member/guardian/ caregiver satisfaction. They would like to proceed with procedure. There are no noted contraindications to procedure.  PROCEDURE [x  ] Without Ultrasound: Patient was placed in a position with the appropriate muscles exposed, located and identified. The skin was cleaned with ChloraPrep and ethyl chloride was sprayed for topical  anesthetic. Using combination EMG amplification and electrical stimulation, the following muscles were identified using anatomical landmarks and injected following aspiration to ensure blood vessels were avoided.  [  ] With Ultrasound: Patient was placed in a position with the appropriate muscles exposed, located and identified. The skin was cleaned with ChloraPrep and ethyl chloride was sprayed for topical anesthetic. Using combination Ultrasound guidance for anatomical guidance, avoidance of significant vasculature, to decrease the risk of hematoma formation and ensure botox was placed in the correct location; EMG amplification and electrical stimulation, the following muscles were identified and injected following aspiration to ensure blood vessels were avoided. A  linear transducer was used during the procedure. The botulinum toxin was visualized entering the appropriate musculature.   MUSCLE: Units /Sites  Right:  medial hamstrings - 75u lateral hamstrings - 75u medial gastrocnemius - 25u lateral gastrocnemius - 25u  Left:  medial hamstrings - 75u lateral hamstrings - 75u medial gastrocnemius - 25u lateral gastrocnemius - 25u   400  units were injected without difficulty. No complications were encountered. The patient tolerated the procedure well. Wasted 0     PLAN: - Resume Usual Activities. Notify Physician of any unusual bleeding, erythema or concern for side effects as reviewed above. - Apply ice prn for pain - Tylenol prn for pain - Follow up with Dr Berline Chough to assess response to injection

## 2023-02-12 NOTE — Patient Instructions (Signed)
  PLAN: - Resume Usual Activities. Notify Physician of any unusual bleeding, erythema or concern for side effects as reviewed above. - Apply ice prn for pain - Tylenol prn for pain - Follow up with Dr Berline Chough to assess response to injection

## 2023-02-13 ENCOUNTER — Other Ambulatory Visit: Payer: Self-pay | Admitting: Internal Medicine

## 2023-02-13 DIAGNOSIS — G8250 Quadriplegia, unspecified: Secondary | ICD-10-CM

## 2023-02-13 MED ORDER — CYCLOBENZAPRINE HCL 10 MG PO TABS: 10.0000 mg | ORAL_TABLET | Freq: Two times a day (BID) | ORAL | 2 refills | Status: DC | PRN

## 2023-02-13 MED ORDER — IBUPROFEN 600 MG PO TABS: 600.0000 mg | ORAL_TABLET | Freq: Four times a day (QID) | ORAL | 0 refills | Status: DC | PRN

## 2023-02-13 NOTE — Telephone Encounter (Signed)
Medication Refill - Medication: ibuprofen (ADVIL) 600 MG tablet and cyclobenzaprine (FLEXERIL) 10 MG tablet   Has the patient contacted their pharmacy? No.  Preferred Pharmacy (with phone number or street name):  [id number] - [place] ([place]), [place] - [address] Phone: [phone]  Fax: [phone]     Has the patient been seen for an appointment in the last year OR does the patient have an upcoming appointment? No.  Agent: Please be advised that RX refills may take up to 3 business days. We ask that you follow-up with your pharmacy.  Patient stated she got 2 botox shots in her legs and they are sore and need some relief

## 2023-02-13 NOTE — Telephone Encounter (Signed)
Requested medication (s) are due for refill today:   Provider to review  Requested medication (s) are on the active medication list:   Yes for both  Future visit scheduled:   Yes 02/25/2023   Last ordered: Advil 12/31/2022 #30, 0 refills;   Flexeril 10/10/2022 #60, 2 refills  Returned because it's a non delegated refill.   She is requesting these meds because she got 2 Botox shots in her legs and they are sore.   Requested Prescriptions  Pending Prescriptions Disp Refills   ibuprofen (ADVIL) 600 MG tablet 30 tablet 0    Sig: Take 1 tablet (600 mg total) by mouth every 6 (six) hours as needed.     Analgesics:  NSAIDS Failed - 02/13/2023  1:40 PM      Failed - Manual Review: Labs are only required if the patient has taken medication for more than 8 weeks.      Passed - Cr in normal range and within 360 days    Creatinine, Ser  Date Value Ref Range Status  06/14/2022 0.89 0.57 - 1.00 mg/dL Final   Creatinine, Urine  Date Value Ref Range Status  12/19/2019 90.49 mg/dL Final    Comment:    Performed at [facility], [address] [place], [place] [address]         Passed - HGB in normal range and within 360 days    Hemoglobin  Date Value Ref Range Status  06/14/2022 12.2 11.1 - 15.9 g/dL Final         Passed - PLT in normal range and within 360 days    Platelets  Date Value Ref Range Status  06/14/2022 269 150 - 450 x10E3/uL Final         Passed - HCT in normal range and within 360 days    Hematocrit  Date Value Ref Range Status  06/14/2022 38.0 34.0 - 46.6 % Final         Passed - eGFR is 30 or above and within 360 days    GFR calc Af Amer  Date Value Ref Range Status  08/18/2020 101 >59 mL/min/1.73 Final    Comment:    **In accordance with recommendations from the NKF-ASN Task force,**   [facility] is in the process of updating its eGFR calculation to the   2021 CKD-EPI creatinine equation that estimates kidney function   without a race variable.    GFR,  Estimated  Date Value Ref Range Status  12/30/2020 >60 >60 mL/min Final    Comment:    (NOTE) Calculated using the CKD-EPI Creatinine Equation (2021)    eGFR  Date Value Ref Range Status  06/14/2022 77 >59 mL/min/1.73 Final         Passed - Patient is not pregnant      Passed - Valid encounter within last 12 months    Recent Outpatient Visits           3 months ago Screening for cervical cancer   [place] [facility], [place], [place]   4 months ago Encounter for [employer] annual wellness exam   [facility] Jonah Blue B, MD   5 months ago Mild protein-calorie malnutrition [facility]   [place] [facility] Marcine Matar, MD   8 months ago Unintentional weight loss   [facility] Marcine Matar, MD   1 year ago Annual  physical exam   [place] [facility] Marcine Matar, MD       Future Appointments             In 1 week Marcine Matar, MD [facility]   In 8 months Marcine Matar, MD [facility]             cyclobenzaprine (FLEXERIL) 10 MG tablet 60 tablet 2    Sig: Take 1 tablet (10 mg total) by mouth 2 (two) times daily as needed for muscle spasms.     Not Delegated - Analgesics:  Muscle Relaxants Failed - 02/13/2023  1:40 PM      Failed - This refill cannot be delegated      Passed - Valid encounter within last 6 months    Recent Outpatient Visits           3 months ago Screening for cervical cancer   [place] [facility] [place], [place], [place]   4 months ago Encounter for [employer] annual wellness exam   [facility] Jonah Blue B, MD   5 months ago Mild protein-calorie malnutrition [facility])   [place] [facility] Marcine Matar, MD   8 months ago Unintentional weight loss   [facility] Marcine Matar, MD   1 year ago Annual physical exam   [facility] Marcine Matar, MD       Future Appointments             In 1 week Marcine Matar, MD [facility]   In 8 months Laural Benes Binnie Rail, MD [facility]

## 2023-02-20 ENCOUNTER — Ambulatory Visit: Payer: Self-pay | Admitting: Internal Medicine

## 2023-02-24 ENCOUNTER — Encounter
Payer: Medicare (Managed Care) | Attending: Physical Medicine and Rehabilitation | Admitting: Physical Medicine and Rehabilitation

## 2023-02-24 ENCOUNTER — Encounter: Payer: Self-pay | Admitting: Physical Medicine and Rehabilitation

## 2023-02-24 VITALS — BP 115/75 | HR 71 | Ht 68.0 in | Wt 116.6 lb

## 2023-02-24 DIAGNOSIS — N319 Neuromuscular dysfunction of bladder, unspecified: Secondary | ICD-10-CM

## 2023-02-24 DIAGNOSIS — G8250 Quadriplegia, unspecified: Secondary | ICD-10-CM | POA: Diagnosis not present

## 2023-02-24 DIAGNOSIS — K592 Neurogenic bowel, not elsewhere classified: Secondary | ICD-10-CM | POA: Diagnosis not present

## 2023-02-24 DIAGNOSIS — M21371 Foot drop, right foot: Secondary | ICD-10-CM | POA: Insufficient documentation

## 2023-02-24 DIAGNOSIS — R252 Cramp and spasm: Secondary | ICD-10-CM | POA: Diagnosis not present

## 2023-02-24 DIAGNOSIS — R269 Unspecified abnormalities of gait and mobility: Secondary | ICD-10-CM | POA: Insufficient documentation

## 2023-02-24 MED ORDER — BACLOFEN 10 MG PO TABS: 10.0000 mg | ORAL_TABLET | Freq: Three times a day (TID) | ORAL | 5 refills | Status: DC

## 2023-02-24 NOTE — Progress Notes (Signed)
Subjective:    Patient ID: Anna Campos, female    DOB: Mar 08, 1966, 57 y.o.   MRN: [id number]  HPI  Patient is a 57 yr old female with incomplete Quadriplegia- C5 ASIA D due to osteomyelitis  with neurogenic bowel and bladder and spasticity as well as chronic nerve pain. Also has some depression due to her hx as well as orthostatic hypotension- off Midodrine and Florinef- but BP still low! .  February 2021. Also has constipation and bowel accidents. Cannot work due to severe spasticity and incomplete quadriplegia.   Here for f/u on SCI   Dr Shearon Stalls gave her Botox of legs- on 4/24-   Botox 400 units Right:  medial hamstrings - 75u lateral hamstrings - 75u medial gastrocnemius - 25u lateral gastrocnemius - 25u   Left:  medial hamstrings - 75u lateral hamstrings - 75u medial gastrocnemius - 25u lateral gastrocnemius - 25u   Feels like legs have gotten somewhat looser since Botox done.   Today is day 12 since injections.   Back is bothering her more lately-  carrying heavy grandson- now has stopped carrying him- neighbor has been keeping grandson some for her now. Lucila Maine is 56 months old- 16 lbs.   Taking Flexeril not really the Baclofen.  Only when needs it- couple times per month.   Stopped Baclofen- started making ears ring.    B/B- doing well- doesn't drink a lot before goes somewhere.  Stopped taking Vesicare- Vesicare doesn't help her with amount of voiding/frequency.   Also has urgency and frequency with bowels- 3-4x/week, but when needs to go has ot run ot bathroom- and cannot stop it.    Having a lot of anxiety- but just sometimes. Not associated with HA; or nasal congestion or AD Sx's.    Pain Inventory Average Pain 7 Pain Right Now 7 My pain is  not specified  LOCATION OF PAIN  back and leg  BOWEL Number of stools per week: 3-4 Oral laxative use No  Type of laxative suppository Enema or suppository use Yes    BLADDER Pads Bladder incontinence  Yes  Leakage with coughing Yes     Mobility use a cane use a walker do you drive?  yes  Function disabled: date disabled .  Neuro/Psych bladder control problems bowel control problems spasms  Prior Studies Any changes since last visit?  no  Physicians involved in your care Any changes since last visit?  no   Family History  Problem Relation Age of Onset   Breast cancer Mother    Cancer Mother    Social History   Socioeconomic History   Marital status: Married    Spouse name: Not on file   Number of children: 1   Years of education: Not on file   Highest education level: Some college, no degree  Occupational History   Not on file  Tobacco Use   Smoking status: Former   Smokeless tobacco: Never  Building services engineer Use: Never used  Substance and Sexual Activity   Alcohol use: Yes    Comment: OCCASIONAL   Drug use: Not Currently   Sexual activity: Yes    Birth control/protection: Post-menopausal  Other Topics Concern   Not on file  Social History Narrative   Not on file   Social Determinants of Health   Financial Resource Strain: Low Risk  (10/10/2022)   Overall Financial Resource Strain (CARDIA)    Difficulty of Paying Living Expenses: Not hard at all  Food Insecurity: No Food Insecurity (10/23/2021)   Hunger Vital Sign    Worried About Running Out of Food in the Last Year: Never true    Ran Out of Food in the Last Year: Never true  Transportation Needs: No Transportation Needs (10/23/2021)   PRAPARE - Administrator, Civil Service (Medical): No    Lack of Transportation (Non-Medical): No  Physical Activity: Insufficiently Active (10/10/2022)   Exercise Vital Sign    Days of Exercise per Week: 7 days    Minutes of Exercise per Session: 20 min  Stress: No Stress Concern Present (10/10/2022)   [employer] of Occupational Health - Occupational Stress Questionnaire    Feeling of Stress : Not at all  Social Connections: Moderately  Isolated (10/10/2022)   Social Connection and Isolation Panel [NHANES]    Frequency of Communication with Friends and Family: More than three times a week    Frequency of Social Gatherings with Friends and Family: Three times a week    Attends Religious Services: Never    Active Member of Clubs or Organizations: No    Attends Engineer, structural: Never    Marital Status: Married   Past Surgical History:  Procedure Laterality Date   ANTERIOR CERVICAL DECOMPRESSION/DISCECTOMY FUSION 4 LEVELS N/A 01/04/2020   Procedure: CERVICAL THREE- FOUR, CERVICAL FOUR-FIVE, CERVICAL FIVE- SIX, CERVCAL SIX- SEVEN ANTERIOR CERVICAL DECOMPRESSION/DISCECTOMY FUSION;  Surgeon: Julio Sicks, MD;  Location: [facility] OR;  Service: Neurosurgery;  Laterality: N/A;  CERVICAL THREE- FOUR, CERVICAL FOUR-FIVE, CERVICAL FIVE- SIX, CERVCAL SIX- SEVEN ANTERIOR CERVICAL DECOMPRESSION/DISCECTOMY FUSION   CESAREAN SECTION     ESOPHAGOGASTRODUODENOSCOPY (EGD) WITH PROPOFOL N/A 12/27/2019   Procedure: ESOPHAGOGASTRODUODENOSCOPY (EGD) WITH PROPOFOL;  Surgeon: Kerin Salen, MD;  Location: [facility] ENDOSCOPY;  Service: Gastroenterology;  Laterality: N/A;  PLEASE ALERT ANESTHESIA TO THE FACT THAT THE PATIENT HAS SUSTAINED A NECK INJURY AND HAS A QUADRIPARESIS   HEMOSTASIS CLIP PLACEMENT  12/27/2019   Procedure: HEMOSTASIS CLIP PLACEMENT;  Surgeon: Kerin Salen, MD;  Location: [facility] ENDOSCOPY;  Service: Gastroenterology;;   MULTIPLE EXTRACTIONS WITH ALVEOLOPLASTY N/A 12/30/2019   Procedure: Extraction of tooth #'s 2-8, 11-14, and 20-28 with alveoloplasty and bilateral mandibular lingual tori reductions.;  Surgeon: Charlynne Pander, DDS;  Location: [facility] OR;  Service: Oral Surgery;  Laterality: N/A;   SPINE SURGERY     Past Medical History:  Diagnosis Date   Blood transfusion without reported diagnosis 12/2019   Genital herpes    PE (pulmonary thromboembolism) (HCC) 12/20/2019   Syncope 12/20/2019   BP 115/75   Pulse 71   Ht 5\' 8"  (1.727  m)   Wt 116 lb 9.6 oz (52.9 kg)   SpO2 100%   BMI 17.73 kg/m   Opioid Risk Score:   Fall Risk Score:  `1  Depression screen PHQ 2/9     02/24/2023    1:48 PM 11/15/2022    1:59 PM 11/14/2022    2:06 PM 08/22/2022    2:33 PM 07/26/2022    2:31 PM 09/12/2021    9:26 AM 08/23/2021    1:35 PM  Depression screen PHQ 2/9  Decreased Interest 0 0 0 0 0 0 3  Down, Depressed, Hopeless 0 0 0 0 0 0 0  PHQ - 2 Score 0 0 0 0 0 0 3  Altered sleeping    1   1  Tired, decreased energy    1   3  Change in appetite    0  0  Feeling bad or failure about yourself     0   0  Trouble concentrating    0   1  Moving slowly or fidgety/restless    0   0  Suicidal thoughts    0   0  PHQ-9 Score    2   8     Review of Systems  Constitutional: Negative.   HENT: Negative.    Eyes: Negative.   Respiratory: Negative.    Cardiovascular: Negative.   Gastrointestinal:        Bowel control  Endocrine: Negative.   Genitourinary:        Bladder control  Musculoskeletal:  Positive for back pain.       Spasms  Skin: Negative.   Allergic/Immunologic: Negative.   Hematological: Negative.   Psychiatric/Behavioral: Negative.    All other systems reviewed and are negative.      Objective:   Physical Exam  Awake, alert, appropriate, using small quad cane to walk, NAD  Neuro: MAS of 1+ L hip, 3 in L knee more with flexion than extension; 1+ in L ankle R hip MAS of 2; MAS of 3 in R knee more with flexion than extension and MAS of 2 in R ankle- Few beats clonus in RLE.   Much less clonus on LLE; fewer beats on RLE MAS was 3-4 before, down to 1+ to 3 so improved.  UE strength and spasticity no change- few beats clonus R wrist and hoffman's B/L Ue's- MS of 1+ in LUE except elbow, which is 2 and RUE- MAS of 2 throughout.        Assessment & Plan:   Patient is a 57 yr old female with incomplete Quadriplegia- C5 ASIA D due to osteomyelitis  with neurogenic bowel and bladder and spasticity as well as  chronic nerve pain. Also has some depression due to her hx as well as orthostatic hypotension- off Midodrine and Florinef- but BP still low! .  February 2021. Also has constipation and bowel accidents. Cannot work due to severe spasticity and incomplete quadriplegia.   Here for f/u on SCI   Restart Baclofen 5 mg 3x/day x  2-3 days, then 10 mg 3x/day- for spasticity- especially for upper body-  2. If has side effects.. ringing in the ears, from baclofen- lower dose, call me and will switch to Tizanidine. Give 2-3 weeks- then call if needs me.   3. Con't Botox- for Dr Shearon Stalls- has appt on 5/24  4. Referral to Urology- they need to do Urodynamics study to f/u on bladder since voids a lot. Says voids large amount when voids, but very frequent.  Has urgency and frequency.   5. Does bowel program, but still does have bowel urgency- has BM when does dig stim. Sometimes takes Miralax- agree with plan.  - Might refer to [facility] for pelvic floor therapy?   6. F/U in 3 months- has appt already with Dr Shearon Stalls.   7. Doesn't have Autonomic dysreflexia- no Sx's of HA, nasal congestion, just anxiety sometimes. Talk to PCP- might try Buspar.    I spent a total of 30   minutes on total care today- >50% coordination of care- due to  Discussing AD Sx's with pt; pelvic floor and spasticity as well and neurogenic b/B- as detailed above.

## 2023-02-24 NOTE — Patient Instructions (Addendum)
Patient is a 57 yr old female with incomplete Quadriplegia- C5 ASIA D due to osteomyelitis  with neurogenic bowel and bladder and spasticity as well as chronic nerve pain. Also has some depression due to her hx as well as orthostatic hypotension- off Midodrine and Florinef- but BP still low! .  February 2021. Also has constipation and bowel accidents. Cannot work due to severe spasticity and incomplete quadriplegia.   Here for f/u on SCI   Restart Baclofen 5 mg 3x/day x  2-3 days, then 10 mg 3x/day- for spasticity- especially for upper body-  2. If has side effects.. ringing in the ears, from baclofen- lower dose, call me and will switch to Tizanidine. Give 2-3 weeks- then call if needs me.   3. Con't Botox- for Dr Shearon Stalls- has appt on 5/24  4. Referral to Urology- they need to do Urodynamics study to f/u on bladder since voids a lot. Says voids large amount when voids, but very frequent.  Has urgency and frequency.   5. Does bowel program, but still does have bowel urgency- has BM when does dig stim. Sometimes takes Miralax- agree with plan.  - Might refer to [facility] for pelvic floor therapy?   6. F/U in 3 months- has appt already with Dr Shearon Stalls.   7. Doesn't have Autonomic dysreflexia- no Sx's of HA, nasal congestion, just anxiety sometimes. Talk to PCP- might try Buspar.

## 2023-02-25 ENCOUNTER — Encounter: Payer: Self-pay | Admitting: Internal Medicine

## 2023-02-25 ENCOUNTER — Ambulatory Visit: Payer: Medicare (Managed Care) | Attending: Internal Medicine | Admitting: Internal Medicine

## 2023-02-25 VITALS — BP 113/74 | HR 79 | Temp 97.8°F | Ht 68.0 in | Wt 114.0 lb

## 2023-02-25 DIAGNOSIS — R159 Full incontinence of feces: Secondary | ICD-10-CM

## 2023-02-25 DIAGNOSIS — G8250 Quadriplegia, unspecified: Secondary | ICD-10-CM

## 2023-02-25 DIAGNOSIS — R152 Fecal urgency: Secondary | ICD-10-CM

## 2023-02-25 DIAGNOSIS — F320 Major depressive disorder, single episode, mild: Secondary | ICD-10-CM | POA: Insufficient documentation

## 2023-02-25 DIAGNOSIS — Z23 Encounter for immunization: Secondary | ICD-10-CM | POA: Diagnosis not present

## 2023-02-25 DIAGNOSIS — R636 Underweight: Secondary | ICD-10-CM | POA: Diagnosis not present

## 2023-02-25 DIAGNOSIS — E441 Mild protein-calorie malnutrition: Secondary | ICD-10-CM

## 2023-02-25 MED ORDER — ZOSTER VAC RECOMB ADJUVANTED 50 MCG/0.5ML IM SUSR: 0.5000 mL | Freq: Once | INTRAMUSCULAR | 0 refills | Status: AC

## 2023-02-25 NOTE — Progress Notes (Signed)
Patient ID: Anna Campos, female    DOB: 05-07-1966  MRN: [id number]  CC: Follow-up (F/u/No questions / concerns)   Subjective: Anna Campos is a 57 y.o. female who presents for chronic ds management Her concerns today include:  Patient with history of incomplete quadriplegia at C5-6 level, neurogenic bowel, neurogenic bladder, nerve pain, anemia, hypotension (was on Florinef), gastric ulcers, PE in the setting of acute illness, HL, genital herpes.    Incomplete quadriplegia: She has seen Dr. Berline Chough yesterday.  She referred her to urology for urodynamic testing.  However patient states that she is not having any issues with her bladder.  Urine flows freely.  Denies any urine frequency or urgency.  She continues to have some fecal incontinence.  She wears pads. She has been started on Botox injections to the lower extremities.  She has had 1 set of injections so far and has found it to be helpful. She has not had any falls.  She continues to ambulate with a quad cane. She continues to remain underweight.  Weight has remained fairly stable.  She tells me that she eats 3 and sometimes small meals a day.  She drinks 2 or more cans of Ensure daily to supplement her meals. Denies any issues with depression at this time. She has not had any issues with dizziness or blood pressure dropping too low recently.  HM:  due for shingrix vaccine. Patient Active Problem List   Diagnosis Date Noted   Fall on same level 08/22/2022   Cervical spinal cord injury (HCC) 08/22/2022   Right foot drop 12/17/2021   Decreased range of motion of finger of right hand 09/12/2021   Neurogenic orthostatic hypotension (HCC) 08/23/2021   Chronic low back pain 12/01/2020   Mixed hyperlipidemia 10/11/2020   Gastric ulcer 10/11/2020   Spasticity 02/16/2020   Nerve pain 02/16/2020   Abnormality of gait 02/16/2020   Pressure injury of skin 01/15/2020   Transaminitis    Hyponatremia    Anemia    Neurogenic bowel  01/11/2020   Neurogenic bladder 01/11/2020   Malnutrition of moderate degree 01/01/2020   Poor dentition    Spinal stenosis of cervical region    Chronic incomplete quadriplegia (HCC)    Hypotension    Bacteremia    Syncope 12/19/2019     Current Outpatient Medications on File Prior to Visit  Medication Sig Dispense Refill   acetaminophen (TYLENOL) 325 MG tablet Take 2 tablets (650 mg total) by mouth every 4 (four) hours as needed for mild pain (temp > 101.5).     baclofen (LIORESAL) 10 MG tablet Take 1 tablet (10 mg total) by mouth 3 (three) times daily. Start with 5 mg TID x 2-3 days, then increase to 10 mg TID_ for spasticity - hopefully lower dose will cause less side effects 90 each 5   cyclobenzaprine (FLEXERIL) 10 MG tablet Take 1 tablet (10 mg total) by mouth 2 (two) times daily as needed for muscle spasms. 60 tablet 2   ibuprofen (ADVIL) 600 MG tablet Take 1 tablet (600 mg total) by mouth every 6 (six) hours as needed. 30 tablet 0   lidocaine (LIDODERM) 5 % Place 1 patch onto the skin daily. Remove & Discard patch within 12 hours or as directed by MD 30 patch 0   Multiple Vitamin (MULTIVITAMIN WITH MINERALS) TABS tablet Take 1 tablet by mouth daily.     solifenacin (VESICARE) 5 MG tablet Take 1 tablet (5 mg total) by mouth daily. (  Patient not taking: Reported on 02/24/2023) 30 tablet 5   No current facility-administered medications on file prior to visit.    Allergies  Allergen Reactions   Ampicillin Diarrhea   Baclofen Other (See Comments)    Ringing in the ears   Chlorhexidine     Social History   Socioeconomic History   Marital status: Married    Spouse name: Not on file   Number of children: 1   Years of education: Not on file   Highest education level: Some college, no degree  Occupational History   Not on file  Tobacco Use   Smoking status: Former   Smokeless tobacco: Never  Vaping Use   Vaping Use: Never used  Substance and Sexual Activity   Alcohol use:  Yes    Comment: OCCASIONAL   Drug use: Not Currently   Sexual activity: Yes    Birth control/protection: Post-menopausal  Other Topics Concern   Not on file  Social History Narrative   Not on file   Social Determinants of Health   Financial Resource Strain: Low Risk  (10/10/2022)   Overall Financial Resource Strain (CARDIA)    Difficulty of Paying Living Expenses: Not hard at all  Food Insecurity: No Food Insecurity (10/23/2021)   Hunger Vital Sign    Worried About Running Out of Food in the Last Year: Never true    Ran Out of Food in the Last Year: Never true  Transportation Needs: No Transportation Needs (10/23/2021)   PRAPARE - Administrator, Civil Service (Medical): No    Lack of Transportation (Non-Medical): No  Physical Activity: Insufficiently Active (10/10/2022)   Exercise Vital Sign    Days of Exercise per Week: 7 days    Minutes of Exercise per Session: 20 min  Stress: No Stress Concern Present (10/10/2022)   [employer] of Occupational Health - Occupational Stress Questionnaire    Feeling of Stress : Not at all  Social Connections: Moderately Isolated (10/10/2022)   Social Connection and Isolation Panel [NHANES]    Frequency of Communication with Friends and Family: More than three times a week    Frequency of Social Gatherings with Friends and Family: Three times a week    Attends Religious Services: Never    Active Member of Clubs or Organizations: No    Attends Banker Meetings: Never    Marital Status: Married  Catering manager Violence: Not At Risk (10/10/2022)   Humiliation, Afraid, Rape, and Kick questionnaire    Fear of Current or Ex-Partner: No    Emotionally Abused: No    Physically Abused: No    Sexually Abused: No    Family History  Problem Relation Age of Onset   Breast cancer Mother    Cancer Mother     Past Surgical History:  Procedure Laterality Date   ANTERIOR CERVICAL DECOMPRESSION/DISCECTOMY FUSION 4 LEVELS  N/A 01/04/2020   Procedure: CERVICAL THREE- FOUR, CERVICAL FOUR-FIVE, CERVICAL FIVE- SIX, CERVCAL SIX- SEVEN ANTERIOR CERVICAL DECOMPRESSION/DISCECTOMY FUSION;  Surgeon: Julio Sicks, MD;  Location: [facility] OR;  Service: Neurosurgery;  Laterality: N/A;  CERVICAL THREE- FOUR, CERVICAL FOUR-FIVE, CERVICAL FIVE- SIX, CERVCAL SIX- SEVEN ANTERIOR CERVICAL DECOMPRESSION/DISCECTOMY FUSION   CESAREAN SECTION     ESOPHAGOGASTRODUODENOSCOPY (EGD) WITH PROPOFOL N/A 12/27/2019   Procedure: ESOPHAGOGASTRODUODENOSCOPY (EGD) WITH PROPOFOL;  Surgeon: Kerin Salen, MD;  Location[facility] ENDOSCOPY;  Service: Gastroenterology;  Laterality: N/A;  PLEASE ALERT ANESTHESIA TO THE FACT THAT THE PATIENT HAS SUSTAINED A NECK INJURY AND HAS  A QUADRIPARESIS   HEMOSTASIS CLIP PLACEMENT  12/27/2019   Procedure: HEMOSTASIS CLIP PLACEMENT;  Surgeon: Kerin Salen, MD;  Location: [facility] ENDOSCOPY;  Service: Gastroenterology;;   MULTIPLE EXTRACTIONS WITH ALVEOLOPLASTY N/A 12/30/2019   Procedure: Extraction of tooth #'s 2-8, 11-14, and 20-28 with alveoloplasty and bilateral mandibular lingual tori reductions.;  Surgeon: Charlynne Pander, DDS;  Location: [facility] OR;  Service: Oral Surgery;  Laterality: N/A;   SPINE SURGERY      ROS: Review of Systems Negative except as stated above  PHYSICAL EXAM: BP 113/74 (BP Location: Left Arm, Patient Position: Sitting, Cuff Size: Normal)   Pulse 79   Temp 97.8 F (36.6 C) (Oral)   Ht 5\' 8"  (1.727 m)   Wt 114 lb (51.7 kg)   SpO2 100%   BMI 17.33 kg/m   Wt Readings from Last 3 Encounters:  02/25/23 114 lb (51.7 kg)  02/24/23 116 lb 9.6 oz (52.9 kg)  02/12/23 113 lb 12.8 oz (51.6 kg)    Physical Exam  General appearance -middle-age African-American female in NAD.  She has temporal and supraclavicular wasting. Mental status - normal mood, behavior, speech, dress, motor activity, and thought processes Chest - clear to auscultation, no wheezes, rales or rhonchi, symmetric air entry Heart - normal rate,  regular rhythm, normal S1, S2, no murmurs, rubs, clicks or gallops Extremities -no lower extremity edema.  She has some wasting of muscles in the hands.     02/25/2023    3:48 PM 02/24/2023    1:48 PM 11/15/2022    1:59 PM  Depression screen PHQ 2/9  Decreased Interest 0 0 0  Down, Depressed, Hopeless 0 0 0  PHQ - 2 Score 0 0 0  Altered sleeping 0    Tired, decreased energy 2    Change in appetite 0    Feeling bad or failure about yourself  0    Trouble concentrating 0    Moving slowly or fidgety/restless 0    Suicidal thoughts 0    PHQ-9 Score 2         Latest Ref Rng & Units 06/14/2022    2:45 PM 12/30/2020    8:15 PM 08/18/2020    3:20 PM  CMP  Glucose 70 - 99 mg/dL 98  96  77   BUN 6 - 24 mg/dL 17  25  12    Creatinine 0.57 - 1.00 mg/dL 1.47  8.29  5.62   Sodium 134 - 144 mmol/L 141  141  142   Potassium 3.5 - 5.2 mmol/L 3.9  4.3  4.1   Chloride 96 - 106 mmol/L 101  103  104   CO2 20 - 29 mmol/L 19  26  26    Calcium 8.7 - 10.2 mg/dL 9.9  13.0  9.6   Total Protein 6.0 - 8.5 g/dL 7.1  8.0  6.7   Total Bilirubin 0.0 - 1.2 mg/dL 0.6  0.8  0.4   Alkaline Phos 44 - 121 IU/L 130  96  114   AST 0 - 40 IU/L 22  20  24    ALT 0 - 32 IU/L 8  11  13      CBC    Component Value Date/Time   WBC 5.5 06/14/2022 1445   WBC 5.8 12/30/2020 2015   RBC 4.18 06/14/2022 1445   RBC 4.71 12/30/2020 2015   HGB 12.2 06/14/2022 1445   HCT 38.0 06/14/2022 1445   PLT 269 06/14/2022 1445   MCV 91 06/14/2022 1445  MCH 29.2 06/14/2022 1445   MCH 29.5 12/30/2020 2015   MCHC 32.1 06/14/2022 1445   MCHC 32.9 12/30/2020 2015   RDW 11.8 06/14/2022 1445   LYMPHSABS 2.0 12/30/2020 2015   MONOABS 0.4 12/30/2020 2015   EOSABS 0.1 12/30/2020 2015   BASOSABS 0.0 12/30/2020 2015    ASSESSMENT AND PLAN:  1. Chronic incomplete quadriplegia (HCC) Stable and followed by PMR.  Continue baclofen for spasticity as needed.  2. Underweight 3. Mild protein-calorie malnutrition (HCC) She will continue to  try to eat 3 or more meals a day.  Continue Ensure for supplement.  4. Incontinence of feces with fecal urgency Wears incontinence pads  5. Need for shingles vaccine Patient declines getting Shingrix vaccine today but states she will get it in the near future.  Prescription given to her to take to her pharmacy when she is ready to have the vaccine.  I went over possible side effects of the vaccine including redness and swelling at the injection site and rare side effect of Gilliam Barr syndrome. - Zoster Vaccine Adjuvanted [facility]) injection; Inject 0.5 mLs into the muscle once for 1 dose.  Dispense: 0.5 mL; Refill: 0    Patient was given the opportunity to ask questions.  Patient verbalized understanding of the plan and was able to repeat key elements of the plan.   This documentation was completed using Paediatric nurse.  Any transcriptional errors are unintentional.  No orders of the defined types were placed in this encounter.    Requested Prescriptions    No prescriptions requested or ordered in this encounter    No follow-ups on file.  Jonah Blue, MD, FACP

## 2023-04-08 ENCOUNTER — Emergency Department (HOSPITAL_BASED_OUTPATIENT_CLINIC_OR_DEPARTMENT_OTHER)
Admission: EM | Admit: 2023-04-08 | Discharge: 2023-04-08 | Disposition: A | Discharge status: Home / Self Care | Payer: Medicare (Managed Care) | Attending: Emergency Medicine | Admitting: Emergency Medicine

## 2023-04-08 ENCOUNTER — Other Ambulatory Visit: Payer: Self-pay

## 2023-04-08 ENCOUNTER — Encounter (HOSPITAL_BASED_OUTPATIENT_CLINIC_OR_DEPARTMENT_OTHER): Payer: Self-pay

## 2023-04-08 DIAGNOSIS — Z1152 Encounter for screening for COVID-19: Secondary | ICD-10-CM | POA: Insufficient documentation

## 2023-04-08 DIAGNOSIS — J329 Chronic sinusitis, unspecified: Secondary | ICD-10-CM | POA: Diagnosis not present

## 2023-04-08 DIAGNOSIS — H109 Unspecified conjunctivitis: Secondary | ICD-10-CM | POA: Insufficient documentation

## 2023-04-08 DIAGNOSIS — H9202 Otalgia, left ear: Secondary | ICD-10-CM | POA: Diagnosis present

## 2023-04-08 LAB — RESP PANEL BY RT-PCR (RSV, FLU A&B, COVID)  RVPGX2
Influenza A by PCR: NEGATIVE
Influenza B by PCR: NEGATIVE
Resp Syncytial Virus by PCR: NEGATIVE
SARS Coronavirus 2 by RT PCR: NEGATIVE

## 2023-04-08 MED ORDER — POLYMYXIN B-TRIMETHOPRIM 10000-0.1 UNIT/ML-% OP SOLN: 1.0000 [drp] | OPHTHALMIC | 0 refills | Status: DC

## 2023-04-08 NOTE — Discharge Instructions (Signed)
Discussed you likely have sinusitis.  I recommend a sinus rinse.  I have sent in eyedrops for the conjunctivitis.  Also recommend you use Debrox ear kit.  For any concerning symptoms return to the emergency room otherwise follow-up with your primary care provider.

## 2023-04-08 NOTE — ED Triage Notes (Signed)
Patient here POV from Home.  Endorses Bilateral eye Discomfort and left Otalgia for 3-4 Days. No known Fevers. Mild Cough. No Sore Throat.   NAD Noted during Triage. A&Ox4. GCS 15. Ambulatory.

## 2023-04-08 NOTE — ED Provider Notes (Signed)
[place] EMERGENCY DEPARTMENT AT [facility] Provider Note   CSN: [id number] Arrival date & time: 04/08/23  1439     History  Chief Complaint  Patient presents with   Otalgia    SHELEA MUGG is a 57 y.o. female.  57 year old female presents today for evaluation of bilateral eye discomfort, left-sided ear pain that has been ongoing since Friday.  She states she tried home remedies without any improvement.  No fever.  Does also endorse rhinorrhea, and clear eye drainage.  Denies any sinus pressure.  No fever cough, or other URI symptoms.  The history is provided by the patient. No language interpreter was used.       Home Medications Prior to Admission medications   Medication Sig Start Date End Date Taking? Authorizing Provider  acetaminophen (TYLENOL) 325 MG tablet Take 2 tablets (650 mg total) by mouth every 4 (four) hours as needed for mild pain (temp > 101.5). 02/04/20   Angiulli, Mcarthur Rossetti, PA-C  baclofen (LIORESAL) 10 MG tablet Take 1 tablet (10 mg total) by mouth 3 (three) times daily. Start with 5 mg TID x 2-3 days, then increase to 10 mg TID_ for spasticity - hopefully lower dose will cause less side effects 02/24/23   Lovorn, Aundra Millet, MD  cyclobenzaprine (FLEXERIL) 10 MG tablet Take 1 tablet (10 mg total) by mouth 2 (two) times daily as needed for muscle spasms. 02/13/23   Marcine Matar, MD  ibuprofen (ADVIL) 600 MG tablet Take 1 tablet (600 mg total) by mouth every 6 (six) hours as needed. 02/13/23   Marcine Matar, MD  lidocaine (LIDODERM) 5 % Place 1 patch onto the skin daily. Remove & Discard patch within 12 hours or as directed by MD 08/29/22   Peter Garter, PA  Multiple Vitamin (MULTIVITAMIN WITH MINERALS) TABS tablet Take 1 tablet by mouth daily.    [provider]  solifenacin (VESICARE) 5 MG tablet Take 1 tablet (5 mg total) by mouth daily. Patient not taking: Reported on 02/24/2023 07/26/22   Genice Rouge, MD      Allergies     Ampicillin, Baclofen, and Chlorhexidine    Review of Systems   Review of Systems  Constitutional:  Negative for chills and fever.  HENT:  Positive for rhinorrhea. Negative for drooling, sinus pressure, sore throat and trouble swallowing.   Respiratory:  Negative for cough and shortness of breath.   All other systems reviewed and are negative.   Physical Exam Updated Vital Signs BP 121/85 (BP Location: Right Arm)   Pulse 73   Temp 98 F (36.7 C) (Oral)   Resp 17   Ht 5\' 7"  (1.702 m)   Wt 52.2 kg   SpO2 100%   BMI 18.01 kg/m  Physical Exam Vitals and nursing note reviewed.  Constitutional:      General: She is not in acute distress.    Appearance: Normal appearance. She is not ill-appearing.  HENT:     Head: Normocephalic and atraumatic.     Right Ear: Tympanic membrane, ear canal and external ear normal.     Left Ear: Tympanic membrane, ear canal and external ear normal.     Nose: Nose normal.     Mouth/Throat:     Mouth: Mucous membranes are moist.  Eyes:     Conjunctiva/sclera: Conjunctivae normal.  Cardiovascular:     Rate and Rhythm: Normal rate and regular rhythm.  Musculoskeletal:        General: No deformity. Normal  range of motion.     Cervical back: Normal range of motion.  Skin:    Findings: No rash.  Neurological:     Mental Status: She is alert.     ED Results / Procedures / Treatments   Labs (all labs ordered are listed, but only abnormal results are displayed) Labs Reviewed  RESP PANEL BY RT-PCR (RSV, FLU A&B, COVID)  [id number]    EKG None  Radiology No results found.  Procedures Procedures    Medications Ordered in ED Medications - No data to display  ED Course/ Medical Decision Making/ A&P                             Medical Decision Making  57 year old female presents with left-sided ear pain, conjunctivitis, sinus pressure, rhinorrhea.  Ongoing since Friday.  Hemodynamically stable.  Afebrile.  No other complaints.  Will give  eyedrops given duration of symptoms and discomfort.  No change in vision.  No photophobia or pain with EOMs.  Given sinus pressure, rhinorrhea likely viral sinusitis.  Symptom management discussed.  Discussed use of Allegra or Zyrtec.  Patient voices understanding and is in agreement with plan.  Patient is appropriate for discharge.  Final Clinical Impression(s) / ED Diagnoses Final diagnoses:  Sinusitis, unspecified chronicity, unspecified location  Conjunctivitis, unspecified conjunctivitis type, unspecified laterality    Rx / DC Orders ED Discharge Orders          Ordered    trimethoprim-polymyxin b (POLYTRIM) ophthalmic solution  Every 4 hours        04/08/23 1630              Marita [place], PA-C 04/08/23 1630    Terrilee Files, MD 04/08/23 2152

## 2023-04-14 DIAGNOSIS — H9012 Conductive hearing loss, unilateral, left ear, with unrestricted hearing on the contralateral side: Secondary | ICD-10-CM | POA: Diagnosis not present

## 2023-04-14 DIAGNOSIS — H6122 Impacted cerumen, left ear: Secondary | ICD-10-CM | POA: Diagnosis not present

## 2023-05-14 ENCOUNTER — Encounter
Payer: Medicare (Managed Care) | Attending: Physical Medicine and Rehabilitation | Admitting: Physical Medicine and Rehabilitation

## 2023-05-14 ENCOUNTER — Encounter: Payer: Self-pay | Admitting: Physical Medicine and Rehabilitation

## 2023-05-14 VITALS — BP 107/70 | HR 65 | Ht 67.0 in | Wt 115.0 lb

## 2023-05-14 DIAGNOSIS — R252 Cramp and spasm: Secondary | ICD-10-CM | POA: Insufficient documentation

## 2023-05-14 DIAGNOSIS — G8222 Paraplegia, incomplete: Secondary | ICD-10-CM | POA: Diagnosis not present

## 2023-05-14 DIAGNOSIS — G8250 Quadriplegia, unspecified: Secondary | ICD-10-CM | POA: Diagnosis not present

## 2023-05-14 MED ORDER — ONABOTULINUMTOXINA 100 UNITS IJ SOLR
400.0000 [IU] | Freq: Once | INTRAMUSCULAR | Status: AC
  Administered 2023-05-14: 400 [IU] via INTRAMUSCULAR

## 2023-05-14 MED ORDER — SODIUM CHLORIDE (PF) 0.9 % IJ SOLN
4.0000 mL | Freq: Once | INTRAMUSCULAR | Status: AC
  Administered 2023-05-14: 4 mL

## 2023-05-14 NOTE — Patient Instructions (Signed)
  PLAN: - Resume Usual Activities. Notify Physician of any unusual bleeding, erythema or concern for side effects as reviewed above. - Apply ice prn for pain - Tylenol prn for pain - Follow up with Dr Berline Chough to assess response to injection

## 2023-05-14 NOTE — Progress Notes (Signed)
  Botolulinum Toxin Injection: [ x  ] BOTOX (onabotulinumtoxinA) [  ] DYSPORT (abobotulinumtoxinA) [  ] Xeomin   Goals with treatment: [x ] Decrease spasms/ abnormal movements [x ] Improve Active / Passive ROM [ ]  Improve ADLs [x ] Improve functional mobility [x ] Improve gait mechanics [x ] Improve positioning/posture [ x] Prevent contracture  [ ]  Prevent joint destruction [ ]  Prevent skin breakdown [ ]  Decrease caregiver burden [ ]  Improve hygiene [x ] Improve Pain  MEDICATION:  ONAbotulinum toxin. 400  Units   NaCl: 4 mLs    CONSENT: Obtained in writing followed by time-out per clinic policy. Consent uploaded to chart.  Benefits discussed included, but were not limited to, decreased muscle tightness and spasticity, increased joint range of motion, improved limb positioning and facilitation of hygiene and nursing care.   Risks discussed included, but were not limited to, pain and discomfort, bleeding, bruising, excessive weakness, venous thrombosis, muscle atrophy, and distant spread of toxin which could include generalized muscle weakness, diplopia, blurred vision, ptosis, dysphagia, dysphonia, dysarthria, urinary incontinence and breathing difficulties. These symptoms have been reported hours to weeks after injection. Swallowing and breathing difficulties may be life threatening, and there have been reports of death. Patient/Family member/Guardian/Caregiver have been offered botulinum toxin informational material upon initial consultation and this information has been continually available. All questions answered to patient/family member/guardian/ caregiver satisfaction. They would like to proceed with procedure. There are no noted contraindications to procedure.  PROCEDURE [x  ] Without Ultrasound: Patient was placed in a position with the appropriate muscles exposed, located and identified. The skin was cleaned with ChloraPrep and ethyl chloride was sprayed for topical  anesthetic. Using combination EMG amplification and electrical stimulation, the following muscles were identified using anatomical landmarks and injected following aspiration to ensure blood vessels were avoided.  [  ] With Ultrasound: Patient was placed in a position with the appropriate muscles exposed, located and identified. The skin was cleaned with ChloraPrep and ethyl chloride was sprayed for topical anesthetic. Using combination Ultrasound guidance for anatomical guidance, avoidance of significant vasculature, to decrease the risk of hematoma formation and ensure botox was placed in the correct location; EMG amplification and electrical stimulation, the following muscles were identified and injected following aspiration to ensure blood vessels were avoided. A  linear transducer was used during the procedure. The botulinum toxin was visualized entering the appropriate musculature.   MUSCLE: Units /Sites  Right:  medial hamstrings - 75u lateral hamstrings - 75u medial gastrocnemius - 25u lateral gastrocnemius - 25u  Left:  medial hamstrings - 75u lateral hamstrings - 75u medial gastrocnemius - 25u lateral gastrocnemius - 25u   400  units were injected without difficulty. No complications were encountered. The patient tolerated the procedure well. Wasted 0     PLAN: - Resume Usual Activities. Notify Physician of any unusual bleeding, erythema or concern for side effects as reviewed above. - Apply ice prn for pain - Tylenol prn for pain - Follow up with Dr Berline Chough to assess response to injection

## 2023-05-26 ENCOUNTER — Encounter
Payer: Medicare (Managed Care) | Attending: Physical Medicine and Rehabilitation | Admitting: Physical Medicine and Rehabilitation

## 2023-05-26 ENCOUNTER — Encounter: Payer: Self-pay | Admitting: Physical Medicine and Rehabilitation

## 2023-05-26 VITALS — BP 102/70 | HR 80 | Ht 67.0 in | Wt 115.0 lb

## 2023-05-26 DIAGNOSIS — K592 Neurogenic bowel, not elsewhere classified: Secondary | ICD-10-CM | POA: Insufficient documentation

## 2023-05-26 DIAGNOSIS — R252 Cramp and spasm: Secondary | ICD-10-CM | POA: Insufficient documentation

## 2023-05-26 DIAGNOSIS — G8250 Quadriplegia, unspecified: Secondary | ICD-10-CM | POA: Diagnosis not present

## 2023-05-26 NOTE — Progress Notes (Signed)
Subjective:    Patient ID: Rosaria Ferries, female    DOB: 19-Dec-1965, 57 y.o.   MRN: [id number]  HPI  Patient is a 57 yr old female with incomplete Quadriplegia- C5 ASIA D due to osteomyelitis  with neurogenic bowel and bladder and spasticity as well as chronic nerve pain. Also has some depression due to her hx as well as orthostatic hypotension- off Midodrine and Florinef- but BP still low! .  February 2021. Also has constipation and bowel accidents. Cannot work due to severe spasticity and incomplete quadriplegia.   Here for f/u on SCI   Last got Botox by Dr Shearon Stalls- 400 units 05/14/23 Right:  medial hamstrings - 75u lateral hamstrings - 75u medial gastrocnemius - 25u lateral gastrocnemius - 25u   Left:  medial hamstrings - 75u lateral hamstrings - 75u medial gastrocnemius - 25u lateral gastrocnemius - 25u    Frustrated with daughter- not doing classes to get trained, so can work.   Feeling good.  Went to [place]- grandson Bday party last week.   Botox working well- and feels better when gets it- hits maximal effect ~ 2 -3 weeks and wears off right before gets more injections.    Weight looks a little better- was weighing max 110s- so up to 115 lbs today!  Taking care of grandson- son of the daughter living with her.  Did restart the Baclofen 10 mg 3x/day- is helping arms some- not too sleepy with it.  No ringing in the ears this time!  Never got Urology- because we had miscommunication on voiding- voiding well ,no issues.    Talked with PCP about anxiety- was taking meds, but wasn't really  When gets upset, goes ot chest, but hasn't been worked up lately. Prayed to god and worked it out.    "Can dance now". Per pt.   Pain Inventory Average Pain 5 Pain Right Now 5 My pain is  not specified  In the last 24 hours, has pain interfered with the following? General activity 4 Relation with others 4 Enjoyment of life 4 What TIME of day is your pain at its worst?  evening and night Sleep (in general) NA  Pain is worse with: walking, bending, and standing Pain improves with:  not specified Relief from Meds:  not specified  Family History  Problem Relation Age of Onset   Breast cancer Mother    Cancer Mother    Social History   Socioeconomic History   Marital status: Married    Spouse name: Not on file   Number of children: 1   Years of education: Not on file   Highest education level: Some college, no degree  Occupational History   Not on file  Tobacco Use   Smoking status: Former   Smokeless tobacco: Never  Vaping Use   Vaping status: Never Used  Substance and Sexual Activity   Alcohol use: Yes    Comment: OCCASIONAL   Drug use: Not Currently   Sexual activity: Yes    Birth control/protection: Post-menopausal  Other Topics Concern   Not on file  Social History Narrative   Not on file   Social Determinants of Health   Financial Resource Strain: Low Risk  (10/10/2022)   Overall Financial Resource Strain (CARDIA)    Difficulty of Paying Living Expenses: Not hard at all  Food Insecurity: No Food Insecurity (10/23/2021)   Hunger Vital Sign    Worried About Running Out of Food in the Last Year: Never true  Ran Out of Food in the Last Year: Never true  Transportation Needs: No Transportation Needs (10/23/2021)   PRAPARE - Administrator, Civil Service (Medical): No    Lack of Transportation (Non-Medical): No  Physical Activity: Insufficiently Active (10/10/2022)   Exercise Vital Sign    Days of Exercise per Week: 7 days    Minutes of Exercise per Session: 20 min  Stress: No Stress Concern Present (10/10/2022)   [employer] of Occupational Health - Occupational Stress Questionnaire    Feeling of Stress : Not at all  Social Connections: Moderately Isolated (10/10/2022)   Social Connection and Isolation Panel [NHANES]    Frequency of Communication with Friends and Family: More than three times a week     Frequency of Social Gatherings with Friends and Family: Three times a week    Attends Religious Services: Never    Active Member of Clubs or Organizations: No    Attends Engineer, structural: Never    Marital Status: Married   Past Surgical History:  Procedure Laterality Date   ANTERIOR CERVICAL DECOMPRESSION/DISCECTOMY FUSION 4 LEVELS N/A 01/04/2020   Procedure: CERVICAL THREE- FOUR, CERVICAL FOUR-FIVE, CERVICAL FIVE- SIX, CERVCAL SIX- SEVEN ANTERIOR CERVICAL DECOMPRESSION/DISCECTOMY FUSION;  Surgeon: Julio Sicks, MD;  Location: [facility] OR;  Service: Neurosurgery;  Laterality: N/A;  CERVICAL THREE- FOUR, CERVICAL FOUR-FIVE, CERVICAL FIVE- SIX, CERVCAL SIX- SEVEN ANTERIOR CERVICAL DECOMPRESSION/DISCECTOMY FUSION   CESAREAN SECTION     ESOPHAGOGASTRODUODENOSCOPY (EGD) WITH PROPOFOL N/A 12/27/2019   Procedure: ESOPHAGOGASTRODUODENOSCOPY (EGD) WITH PROPOFOL;  Surgeon: Kerin Salen, MD;  Location: [facility] ENDOSCOPY;  Service: Gastroenterology;  Laterality: N/A;  PLEASE ALERT ANESTHESIA TO THE FACT THAT THE PATIENT HAS SUSTAINED A NECK INJURY AND HAS A QUADRIPARESIS   HEMOSTASIS CLIP PLACEMENT  12/27/2019   Procedure: HEMOSTASIS CLIP PLACEMENT;  Surgeon: Kerin Salen, MD;  Location: [facility] ENDOSCOPY;  Service: Gastroenterology;;   MULTIPLE EXTRACTIONS WITH ALVEOLOPLASTY N/A 12/30/2019   Procedure: Extraction of tooth #'s 2-8, 11-14, and 20-28 with alveoloplasty and bilateral mandibular lingual tori reductions.;  Surgeon: Charlynne Pander, DDS;  Location: [facility] OR;  Service: Oral Surgery;  Laterality: N/A;   SPINE SURGERY     Past Surgical History:  Procedure Laterality Date   ANTERIOR CERVICAL DECOMPRESSION/DISCECTOMY FUSION 4 LEVELS N/A 01/04/2020   Procedure: CERVICAL THREE- FOUR, CERVICAL FOUR-FIVE, CERVICAL FIVE- SIX, CERVCAL SIX- SEVEN ANTERIOR CERVICAL DECOMPRESSION/DISCECTOMY FUSION;  Surgeon: Julio Sicks, MD;  Location: [facility] OR;  Service: Neurosurgery;  Laterality: N/A;  CERVICAL THREE- FOUR, CERVICAL  FOUR-FIVE, CERVICAL FIVE- SIX, CERVCAL SIX- SEVEN ANTERIOR CERVICAL DECOMPRESSION/DISCECTOMY FUSION   CESAREAN SECTION     ESOPHAGOGASTRODUODENOSCOPY (EGD) WITH PROPOFOL N/A 12/27/2019   Procedure: ESOPHAGOGASTRODUODENOSCOPY (EGD) WITH PROPOFOL;  Surgeon: Kerin Salen, MD;  Location: [facility] ENDOSCOPY;  Service: Gastroenterology;  Laterality: N/A;  PLEASE ALERT ANESTHESIA TO THE FACT THAT THE PATIENT HAS SUSTAINED A NECK INJURY AND HAS A QUADRIPARESIS   HEMOSTASIS CLIP PLACEMENT  12/27/2019   Procedure: HEMOSTASIS CLIP PLACEMENT;  Surgeon: Kerin Salen, MD;  Location: [facility] ENDOSCOPY;  Service: Gastroenterology;;   MULTIPLE EXTRACTIONS WITH ALVEOLOPLASTY N/A 12/30/2019   Procedure: Extraction of tooth #'s 2-8, 11-14, and 20-28 with alveoloplasty and bilateral mandibular lingual tori reductions.;  Surgeon: Charlynne Pander, DDS;  Location: [facility] OR;  Service: Oral Surgery;  Laterality: N/A;   SPINE SURGERY     Past Medical History:  Diagnosis Date   Blood transfusion without reported diagnosis 12/2019   Genital herpes    PE (pulmonary thromboembolism) (HCC) 12/20/2019  Syncope 12/20/2019   BP 102/70   Pulse 80   Ht 5\' 7"  (1.702 m)   Wt 115 lb (52.2 kg)   SpO2 97%   BMI 18.01 kg/m   Opioid Risk Score:   Fall Risk Score:  `1  Depression screen PHQ 2/9     05/26/2023    1:53 PM 05/14/2023    1:46 PM 02/25/2023    3:48 PM 02/24/2023    1:48 PM 11/15/2022    1:59 PM 11/14/2022    2:06 PM 08/22/2022    2:33 PM  Depression screen PHQ 2/9  Decreased Interest 0 0 0 0 0 0 0  Down, Depressed, Hopeless 0 0 0 0 0 0 0  PHQ - 2 Score 0 0 0 0 0 0 0  Altered sleeping   0    1  Tired, decreased energy   2    1  Change in appetite   0    0  Feeling bad or failure about yourself    0    0  Trouble concentrating   0    0  Moving slowly or fidgety/restless   0    0  Suicidal thoughts   0    0  PHQ-9 Score   2    2    Review of Systems  Constitutional: Negative.   HENT: Negative.    Eyes: Negative.    Respiratory: Negative.    Cardiovascular: Negative.   Gastrointestinal: Negative.   Endocrine: Negative.   Genitourinary: Negative.   Musculoskeletal:        Right side  Skin: Negative.   Allergic/Immunologic: Negative.   Neurological: Negative.   Hematological: Negative.   Psychiatric/Behavioral: Negative.    All other systems reviewed and are negative.      Objective:   Physical Exam  Awake, alert, appropriate, less anxious today, NAD MS of 2 in RUE- elbow and wrist/hands on RUE No increased tone in LUE Hoffman's RUE, but not LUE LE's MAS of 3-4  RLE- worse than LLE LLE- MAS of 3 in L knee and hip; MAS of 2 in L ankle; 3 in R ankle And Clonus- 3-5 beats RLE and 3 beats on LLE.       Assessment & Plan:   Patient is a 57 yr old female with incomplete Quadriplegia- C5 ASIA D due to osteomyelitis  with neurogenic bowel and bladder and spasticity as well as chronic nerve pain. Also has some depression due to her hx as well as orthostatic hypotension- off Midodrine and Florinef- but BP still low! .  February 2021. Also has constipation and bowel accidents. Cannot work due to severe spasticity and incomplete quadriplegia.   Here for f/u on SCI   Con't Baclofen 10 mg 3x/day-  for spasticity.   2.  Con't Botox by Dr Shearon Stalls- - 400 units- working OK.   3. Con't Baclofen 10 mg 3x/day- for spasticity- a little better in RUE.   4. Doesn't need Urology- ! Voiding going well.   5. No ringing in ears with Baclofen.   6. F/U in 3 months-  double appt-   7. No interaction with Advil/baclofen- only Flexeril and baclofen, but Advil is fine with Baclofen- no issues.     I spent a total of  30  minutes on total care today- >50% coordination of care- due to  F/u on Spasticity, and d/w of interactions; and d/w pt about Urology- doesn't need-

## 2023-05-26 NOTE — Patient Instructions (Addendum)
Patient is a 57 yr old female with incomplete Quadriplegia- C5 ASIA D due to osteomyelitis  with neurogenic bowel and bladder and spasticity as well as chronic nerve pain. Also has some depression due to her hx as well as orthostatic hypotension- off Midodrine and Florinef- but BP still low! .  February 2021. Also has constipation and bowel accidents. Cannot work due to severe spasticity and incomplete quadriplegia.   Here for f/u on SCI   Con't Baclofen 10 mg 3x/day-  for spasticity.   2.  Con't Botox by Dr Shearon Stalls- - 400 units- working OK.   3. Con't Baclofen 10 mg 3x/day- for spasticity- a little better in RUE.   4. Doesn't need Urology- ! Voiding going well.   5. No ringing in ears with Baclofen.   6. F/U in 3 months-  double appt-   7. No interference with Advil and Baclofen- only Flexeril can interfere with Baclofen, but Advil is fine- not interaction.

## 2023-06-16 ENCOUNTER — Telehealth: Payer: Self-pay | Admitting: Physical Medicine and Rehabilitation

## 2023-06-16 NOTE — Telephone Encounter (Signed)
Patients requesting more medicine based on conversation and provider's recommendation

## 2023-06-16 NOTE — Telephone Encounter (Signed)
Patient requesting increase on botox. Please advise  Patient is a 57 yr old female with incomplete Quadriplegia- C5 ASIA D due to osteomyelitis  with neurogenic bowel and bladder and spasticity as well as chronic nerve pain. Also has some depression due to her hx as well as orthostatic hypotension- off Midodrine and Florinef- but BP still low! .  February 2021. Also has constipation and bowel accidents. Cannot work due to severe spasticity and incomplete quadriplegia.   Here for f/u on SCI     Con't Baclofen 10 mg 3x/day-  for spasticity.    2.  Con't Botox by Dr Shearon Stalls- - 400 units- working OK.    3. Con't Baclofen 10 mg 3x/day- for spasticity- a little better in RUE.    4. Doesn't need Urology- ! Voiding going well.    5. No ringing in ears with Baclofen.    6. F/U in 3 months-  double appt-    7. No interference with Advil and Baclofen- only Flexeril can interfere with Baclofen, but Advil is fine- not interaction.        Instructions   Return in about 3 months (around 08/26/2023) for double appt- f/u on Botox and SCI- gait/spasticity.

## 2023-06-17 NOTE — Telephone Encounter (Signed)
Patient notified/ Appt note edited from 400 to 500.

## 2023-07-28 ENCOUNTER — Ambulatory Visit: Payer: Medicare (Managed Care) | Admitting: Internal Medicine

## 2023-08-20 ENCOUNTER — Encounter
Payer: Medicare (Managed Care) | Attending: Physical Medicine and Rehabilitation | Admitting: Physical Medicine and Rehabilitation

## 2023-08-20 VITALS — BP 103/71 | HR 81 | Ht 67.0 in | Wt 116.0 lb

## 2023-08-20 DIAGNOSIS — R269 Unspecified abnormalities of gait and mobility: Secondary | ICD-10-CM | POA: Insufficient documentation

## 2023-08-20 DIAGNOSIS — M62838 Other muscle spasm: Secondary | ICD-10-CM

## 2023-08-20 DIAGNOSIS — G8254 Quadriplegia, C5-C7 incomplete: Secondary | ICD-10-CM | POA: Insufficient documentation

## 2023-08-20 DIAGNOSIS — R252 Cramp and spasm: Secondary | ICD-10-CM | POA: Diagnosis not present

## 2023-08-20 MED ORDER — SODIUM CHLORIDE (PF) 0.9 % IJ SOLN
5.0000 mL | INTRAMUSCULAR | Status: AC | PRN
  Administered 2023-08-20: 5 mL via INTRAVENOUS

## 2023-08-20 MED ORDER — ONABOTULINUMTOXINA 100 UNITS IJ SOLR
500.0000 [IU] | Freq: Once | INTRAMUSCULAR | Status: AC
  Administered 2023-08-20 (×2): 500 [IU] via INTRAMUSCULAR

## 2023-08-20 NOTE — Patient Instructions (Signed)
-   Resume Usual Activities. Notify Physician of any unusual bleeding, erythema or concern for side effects as reviewed above. - Apply ice prn for pain - Tylenol prn for pain - Follow up with Dr Berline Chough to assess response to injection

## 2023-08-20 NOTE — Progress Notes (Signed)
Anna Campos is a 57 y.o. year old female  who  has a past medical history of Blood transfusion without reported diagnosis (12/2019), Genital herpes, PE (pulmonary thromboembolism) (HCC) (12/20/2019), and Syncope (12/20/2019).   They are presenting to PM&R clinic for botox injections for bilateral lower extremity spasticity.   No new diagnosis since last visit. No new concerns. She says "I went to an [employer] thing and I over-exerted myself a little nit". No falls, just felt weak ongoing since then. She feels like she tends to bend her waist forward and has a hard time straightening up  PE: MAS 1 bilateral calves - 3+ clonus b/l Mas 2 bilateral hamstrings MAS 1 bilateral quadriceps Mas 2 bilateral hip flexors R lumbar paraspinals tight, fixed in spasm on exam;   Botolulinum Toxin Injection: [ x  ] BOTOX (onabotulinumtoxinA) [  ] DYSPORT (abobotulinumtoxinA) [  ] Xeomin    Goals with treatment: [x ] Decrease spasms/ abnormal movements [x ] Improve Active / Passive ROM [ ]  Improve ADLs [x ] Improve functional mobility [x ] Improve gait mechanics [x ] Improve positioning/posture [ x] Prevent contracture  [ ]  Prevent joint destruction [ ]  Prevent skin breakdown [ ]  Decrease caregiver burden [ ]  Improve hygiene [x ] Improve Pain   MEDICATION:  ONAbotulinum toxin. 500  Units    NaCl: 5 mLs     CONSENT: Obtained in writing followed by time-out per clinic policy. Consent uploaded to chart.   Benefits discussed included, but were not limited to, decreased muscle tightness and spasticity, increased joint range of motion, improved limb positioning and facilitation of hygiene and nursing care.    Risks discussed included, but were not limited to, pain and discomfort, bleeding, bruising, excessive weakness, venous thrombosis, muscle atrophy, and distant spread of toxin which could include generalized muscle weakness, diplopia, blurred vision, ptosis, dysphagia, dysphonia, dysarthria, urinary  incontinence and breathing difficulties. These symptoms have been reported hours to weeks after injection. Swallowing and breathing difficulties may be life threatening, and there have been reports of death. Patient/Family member/Guardian/Caregiver have been offered botulinum toxin informational material upon initial consultation and this information has been continually available. All questions answered to patient/family member/guardian/ caregiver satisfaction. They would like to proceed with procedure. There are no noted contraindications to procedure.   PROCEDURE [x  ] Without Ultrasound: Patient was placed in a position with the appropriate muscles exposed, located and identified. The skin was cleaned with ChloraPrep and ethyl chloride was sprayed for topical anesthetic. Using combination EMG amplification and electrical stimulation, the following muscles were identified using anatomical landmarks and injected following aspiration to ensure blood vessels were avoided.   [  ] With Ultrasound: Patient was placed in a position with the appropriate muscles exposed, located and identified. The skin was cleaned with ChloraPrep and ethyl chloride was sprayed for topical anesthetic. Using combination Ultrasound guidance for anatomical guidance, avoidance of significant vasculature, to decrease the risk of hematoma formation and ensure botox was placed in the correct location; EMG amplification and electrical stimulation, the following muscles were identified and injected following aspiration to ensure blood vessels were avoided. A  linear transducer was used during the procedure. The botulinum toxin was visualized entering the appropriate musculature.    MUSCLE: Units /Sites   Right:  medial hamstrings - 75u lateral hamstrings - 75u medial gastrocnemius - 25u lateral gastrocnemius - 25u *Adding lumbar paraspinals - 10 units, 2 areas *Adding Psoas - 25 U *Adding quadriceps - 20 units  Left:  medial  hamstrings - 75u lateral hamstrings - 75u medial gastrocnemius - 25u lateral gastrocnemius - 25u  *Adding Psoas - 25 U *Adding quadriceps - 20 units    500  units were injected without difficulty. No complications were encountered. The patient tolerated the procedure well. Wasted 0       PLAN: - Resume Usual Activities. Notify Physician of any unusual bleeding, erythema or concern for side effects as reviewed above. - Apply ice prn for pain - Tylenol prn for pain - Follow up with Dr Berline Chough to assess response to injection, injections Q3M

## 2023-09-05 ENCOUNTER — Encounter
Payer: Medicare (Managed Care) | Attending: Physical Medicine and Rehabilitation | Admitting: Physical Medicine and Rehabilitation

## 2023-09-05 ENCOUNTER — Encounter: Payer: Self-pay | Admitting: Physical Medicine and Rehabilitation

## 2023-09-05 ENCOUNTER — Encounter: Payer: Self-pay | Admitting: Internal Medicine

## 2023-09-05 VITALS — BP 101/66 | HR 81 | Ht 67.0 in | Wt 117.0 lb

## 2023-09-05 DIAGNOSIS — G8250 Quadriplegia, unspecified: Secondary | ICD-10-CM | POA: Diagnosis not present

## 2023-09-05 DIAGNOSIS — M21371 Foot drop, right foot: Secondary | ICD-10-CM | POA: Diagnosis not present

## 2023-09-05 DIAGNOSIS — R252 Cramp and spasm: Secondary | ICD-10-CM | POA: Insufficient documentation

## 2023-09-05 DIAGNOSIS — R269 Unspecified abnormalities of gait and mobility: Secondary | ICD-10-CM | POA: Insufficient documentation

## 2023-09-05 MED ORDER — BACLOFEN 10 MG PO TABS: 10.0000 mg | ORAL_TABLET | Freq: Three times a day (TID) | ORAL | 5 refills | Status: DC

## 2023-09-05 NOTE — Progress Notes (Signed)
Subjective:    Patient ID: Anna Campos, female    DOB: Jan 24, 1966, 57 y.o.   MRN: [id number]  HPI  Patient is a 57 yr old female with incomplete Quadriplegia- C5 ASIA D due to osteomyelitis  with neurogenic bowel and bladder and spasticity as well as chronic nerve pain. Also has some depression due to her hx as well as orthostatic hypotension- off Midodrine and Florinef- but BP still low! .  February 2021. Also has constipation and bowel accidents. Cannot work due to severe spasticity and incomplete quadriplegia.   Here for f/u on SCI    Dr Shearon Stalls Botox 08/20/23 Right:  medial hamstrings - 75u lateral hamstrings - 75u medial gastrocnemius - 25u lateral gastrocnemius - 25u *Adding lumbar paraspinals - 10 units, 2 areas *Adding Psoas - 25 U *Adding quadriceps - 20 units     Left:  medial hamstrings - 75u lateral hamstrings - 75u medial gastrocnemius - 25u lateral gastrocnemius - 25u  *Adding Psoas - 25 U *Adding quadriceps - 20 units    500  units were injected without difficulty. No complications were encountered. The patient tolerated the procedure well. Wasted 0    Up to 117 lbs today from 115 lbs!  Anxiety is doing well- no problems there  Spasticity better since Dr Shearon Stalls went up on Botox dosing this last time-   Still taking baclofen- 3x/day-   Bowels- off and on- 3x/week- stays home, so able ot make it to bathroom.  Sometimes if goes to grocery store- but also wears pads, so helps.   In case of has bowel or bladder accidents, can deal wit hit.   Asking about a whole body massage.     Pain Inventory Average Pain 6 Pain Right Now 6 My pain is  not specified  LOCATION OF PAIN  back  BOWEL Number of stools per week: 3  BLADDER Pads  Mobility use a cane use a walker  Function disabled: date disabled .  Neuro/Psych bowel control problems spasms  Prior Studies Any changes since last visit?  no  Physicians involved in your care Any changes  since last visit?  no   Family History  Problem Relation Age of Onset   Breast cancer Mother    Cancer Mother    Social History   Socioeconomic History   Marital status: Married    Spouse name: Not on file   Number of children: 1   Years of education: Not on file   Highest education level: Some college, no degree  Occupational History   Not on file  Tobacco Use   Smoking status: Former   Smokeless tobacco: Never  Vaping Use   Vaping status: Never Used  Substance and Sexual Activity   Alcohol use: Yes    Comment: OCCASIONAL   Drug use: Not Currently   Sexual activity: Yes    Birth control/protection: Post-menopausal  Other Topics Concern   Not on file  Social History Narrative   Not on file   Social Determinants of Health   Financial Resource Strain: Low Risk  (10/10/2022)   Overall Financial Resource Strain (CARDIA)    Difficulty of Paying Living Expenses: Not hard at all  Food Insecurity: No Food Insecurity (10/23/2021)   Hunger Vital Sign    Worried About Running Out of Food in the Last Year: Never true    Ran Out of Food in the Last Year: Never true  Transportation Needs: No Transportation Needs (10/23/2021)   PRAPARE -  Administrator, Civil Service (Medical): No    Lack of Transportation (Non-Medical): No  Physical Activity: Insufficiently Active (10/10/2022)   Exercise Vital Sign    Days of Exercise per Week: 7 days    Minutes of Exercise per Session: 20 min  Stress: No Stress Concern Present (10/10/2022)   [employer] of Occupational Health - Occupational Stress Questionnaire    Feeling of Stress : Not at all  Social Connections: Moderately Isolated (10/10/2022)   Social Connection and Isolation Panel [NHANES]    Frequency of Communication with Friends and Family: More than three times a week    Frequency of Social Gatherings with Friends and Family: Three times a week    Attends Religious Services: Never    Active Member of Clubs or  Organizations: No    Attends Engineer, structural: Never    Marital Status: Married   Past Surgical History:  Procedure Laterality Date   ANTERIOR CERVICAL DECOMPRESSION/DISCECTOMY FUSION 4 LEVELS N/A 01/04/2020   Procedure: CERVICAL THREE- FOUR, CERVICAL FOUR-FIVE, CERVICAL FIVE- SIX, CERVCAL SIX- SEVEN ANTERIOR CERVICAL DECOMPRESSION/DISCECTOMY FUSION;  Surgeon: Julio Sicks, MD;  Location: [facility] OR;  Service: Neurosurgery;  Laterality: N/A;  CERVICAL THREE- FOUR, CERVICAL FOUR-FIVE, CERVICAL FIVE- SIX, CERVCAL SIX- SEVEN ANTERIOR CERVICAL DECOMPRESSION/DISCECTOMY FUSION   CESAREAN SECTION     ESOPHAGOGASTRODUODENOSCOPY (EGD) WITH PROPOFOL N/A 12/27/2019   Procedure: ESOPHAGOGASTRODUODENOSCOPY (EGD) WITH PROPOFOL;  Surgeon: Kerin Salen, MD;  Location: [facility] ENDOSCOPY;  Service: Gastroenterology;  Laterality: N/A;  PLEASE ALERT ANESTHESIA TO THE FACT THAT THE PATIENT HAS SUSTAINED A NECK INJURY AND HAS A QUADRIPARESIS   HEMOSTASIS CLIP PLACEMENT  12/27/2019   Procedure: HEMOSTASIS CLIP PLACEMENT;  Surgeon: Kerin Salen, MD;  Location: [facility] ENDOSCOPY;  Service: Gastroenterology;;   MULTIPLE EXTRACTIONS WITH ALVEOLOPLASTY N/A 12/30/2019   Procedure: Extraction of tooth #'s 2-8, 11-14, and 20-28 with alveoloplasty and bilateral mandibular lingual tori reductions.;  Surgeon: Charlynne Pander, DDS;  Location: [facility] OR;  Service: Oral Surgery;  Laterality: N/A;   SPINE SURGERY     Past Medical History:  Diagnosis Date   Blood transfusion without reported diagnosis 12/2019   Genital herpes    PE (pulmonary thromboembolism) (HCC) 12/20/2019   Syncope 12/20/2019   BP 101/66   Pulse 81   Ht 5\' 7"  (1.702 m)   Wt 117 lb (53.1 kg)   SpO2 98%   BMI 18.32 kg/m   Opioid Risk Score:   Fall Risk Score:  `1  Depression screen [facility] 2/9     09/05/2023    2:57 PM 05/26/2023    1:53 PM 05/14/2023    1:46 PM 02/25/2023    3:48 PM 02/24/2023    1:48 PM 11/15/2022    1:59 PM 11/14/2022    2:06 PM  Depression  screen PHQ 2/9  Decreased Interest 0 0 0 0 0 0 0  Down, Depressed, Hopeless 0 0 0 0 0 0 0  PHQ - 2 Score 0 0 0 0 0 0 0  Altered sleeping    0     Tired, decreased energy    2     Change in appetite    0     Feeling bad or failure about yourself     0     Trouble concentrating    0     Moving slowly or fidgety/restless    0     Suicidal thoughts    0     PHQ-9 Score  2       Review of Systems  Constitutional: Negative.   HENT: Negative.    Eyes: Negative.   Respiratory: Negative.    Cardiovascular: Negative.   Gastrointestinal: Negative.        Bowel control  Endocrine: Negative.   Genitourinary: Negative.   Musculoskeletal:  Positive for back pain and gait problem.       Spasms  Skin: Negative.   Allergic/Immunologic: Negative.   Hematological: Negative.   Psychiatric/Behavioral: Negative.    All other systems reviewed and are negative.      Objective:   Physical Exam  Awake, alert, appropriate, using SPC to walk, NAD Neuro MAS of 2 in RUE- 1+ in LUE- curling of 3rd/4th /5th digits due to tone in RUE- not really on L hand MAS of 3-4 in RLE, throughout- but can get R ankle to 95-100 degrees with work; easily can get 110 degrees on L ankle MAS of 2-3 in LLE- throughout  A little better-   Gait- stiff legged gait with Single point cane. - can lift feet better       Assessment & Plan:   Patient is a 57 yr old female with incomplete Quadriplegia- C5 ASIA D due to osteomyelitis  with neurogenic bowel and bladder and spasticity as well as chronic nerve pain. Also has some depression due to her hx as well as orthostatic hypotension- off Midodrine and Florinef- but BP still low! .  February 2021. Also has constipation and bowel accidents. Cannot work due to severe spasticity and incomplete quadriplegia.   Here for f/u on SCI   Suggest might have bacterial vaginosis- go on Mychart and send message to Dr Laural Benes- your PCP- and that way she might be willing to prescribe  meds for it, based on clinical Symptoms.    2.  Suggest massage- myofascial /deep tissue massage- not swedish massage.  Deep tissue will be more effective although might be uncomfortable, if it's painful, more than uncomfortable, tell them to stop.    3. Baclofen 10 mg 3x/day for spasticity along with Botox- lower dose not causing ringing in ears, so willing to take    4.   No meds for low BP- BP still soft, but better than it used to be.   5. F/U in 3 months- single appointment    I spent a total of 23   minutes on total care today- >50% coordination of care- due to d/w pt about her spasticity, bacterial vaginosis Sx's- and BP.

## 2023-09-05 NOTE — Patient Instructions (Signed)
Patient is a 57 yr old female with incomplete Quadriplegia- C5 ASIA D due to osteomyelitis  with neurogenic bowel and bladder and spasticity as well as chronic nerve pain. Also has some depression due to her hx as well as orthostatic hypotension- off Midodrine and Florinef- but BP still low! .  February 2021. Also has constipation and bowel accidents. Cannot work due to severe spasticity and incomplete quadriplegia.   Here for f/u on SCI   Suggest might have bacterial vaginosis- go on Mychart and send message to Dr Laural Benes- your PCP- and that way she might be willing to prescribe meds for it, based on clinical Symptoms.    2.  Suggest massage- myofascial /deep tissue massage- not swedish massage.  Deep tissue will be more effective although might be uncomfortable, if it's painful, more than uncomfortable, tell them to stop.    3. Baclofen 10 mg 3x/day for spasticity along with Botox- lower dose not causing ringing in ears, so willing to take    4.   No meds for low BP- BP still soft, but better than it used to be.   5. F/U in 3 months- single appointment

## 2023-09-10 ENCOUNTER — Other Ambulatory Visit: Payer: Self-pay | Admitting: Internal Medicine

## 2023-09-10 DIAGNOSIS — R8290 Unspecified abnormal findings in urine: Secondary | ICD-10-CM

## 2023-09-23 ENCOUNTER — Ambulatory Visit: Payer: Medicare (Managed Care) | Attending: Family Medicine

## 2023-09-23 DIAGNOSIS — R8290 Unspecified abnormal findings in urine: Secondary | ICD-10-CM

## 2023-09-24 LAB — URINALYSIS, ROUTINE W REFLEX MICROSCOPIC
Bilirubin, UA: NEGATIVE
Glucose, UA: NEGATIVE
Ketones, UA: NEGATIVE
Nitrite, UA: NEGATIVE
Protein,UA: NEGATIVE
RBC, UA: NEGATIVE
Specific Gravity, UA: 1.009 (ref 1.005–1.030)
Urobilinogen, Ur: 0.2 mg/dL (ref 0.2–1.0)
pH, UA: 6.5 (ref 5.0–7.5)

## 2023-09-24 LAB — MICROSCOPIC EXAMINATION
Bacteria, UA: NONE SEEN
Casts: NONE SEEN /[LPF]
RBC, Urine: NONE SEEN /[HPF] (ref 0–2)

## 2023-10-13 ENCOUNTER — Encounter: Payer: Self-pay | Admitting: Internal Medicine

## 2023-10-13 ENCOUNTER — Ambulatory Visit: Payer: Medicare (Managed Care) | Attending: Internal Medicine | Admitting: Internal Medicine

## 2023-10-13 VITALS — BP 96/63 | HR 93 | Ht 67.0 in | Wt 118.0 lb

## 2023-10-13 DIAGNOSIS — G8250 Quadriplegia, unspecified: Secondary | ICD-10-CM

## 2023-10-13 DIAGNOSIS — R748 Abnormal levels of other serum enzymes: Secondary | ICD-10-CM

## 2023-10-13 DIAGNOSIS — R636 Underweight: Secondary | ICD-10-CM

## 2023-10-13 DIAGNOSIS — Z23 Encounter for immunization: Secondary | ICD-10-CM | POA: Diagnosis not present

## 2023-10-13 DIAGNOSIS — N319 Neuromuscular dysfunction of bladder, unspecified: Secondary | ICD-10-CM | POA: Diagnosis not present

## 2023-10-13 DIAGNOSIS — R252 Cramp and spasm: Secondary | ICD-10-CM

## 2023-10-13 DIAGNOSIS — Z0000 Encounter for general adult medical examination without abnormal findings: Secondary | ICD-10-CM | POA: Diagnosis not present

## 2023-10-13 DIAGNOSIS — H1133 Conjunctival hemorrhage, bilateral: Secondary | ICD-10-CM

## 2023-10-13 DIAGNOSIS — Z1231 Encounter for screening mammogram for malignant neoplasm of breast: Secondary | ICD-10-CM

## 2023-10-13 DIAGNOSIS — I9589 Other hypotension: Secondary | ICD-10-CM

## 2023-10-13 DIAGNOSIS — H6123 Impacted cerumen, bilateral: Secondary | ICD-10-CM

## 2023-10-13 DIAGNOSIS — H1013 Acute atopic conjunctivitis, bilateral: Secondary | ICD-10-CM

## 2023-10-13 MED ORDER — OLOPATADINE HCL 0.1 % OP SOLN: 1.0000 [drp] | Freq: Two times a day (BID) | OPHTHALMIC | 0 refills | Status: DC | PRN

## 2023-10-13 NOTE — Progress Notes (Signed)
Patient ID: Anna Campos, female    DOB: April 21, 1966  MRN: [id number]  CC: Annual Exam (Physical. /No questions / concerns/Yes to flu. No to shingles vax)   Subjective: Anna Campos is a 57 y.o. female who presents for physical exam Her concerns today include:  Patient with history of incomplete quadriplegia at C5-6 level, neurogenic bowel, neurogenic bladder, nerve pain, anemia, hypotension (was on Florinef), gastric ulcers, PE in the setting of acute illness, HL, genital herpes.    Discussed the use of AI scribe software for clinical note transcription with the patient, who gave verbal consent to proceed.  History of Present Illness    Underwgh/Protein malnutrition: The patient, with a history of low body mass index, reports a weight gain from 114 to 118 pounds since the last visit in May. She attributes this to increased food intake, including snacks throughout the day and soups during the cold weather. She also consumes Ensure shakes and booster supplements. The patient notes increased weight in the thighs.  Chronic incomplete quadriplegia: The patient remains active, taking care of a one-year-old grandchild and doing grocery shopping. She uses a four-prong cane for mobility and denies any recent falls. She receives Botox injections every three months for spasticity in the legs, which she reports helps for a couple of weeks before the stiffness returns. She takes baclofen daily for the stiffness. -The patient reports occasional urinary leakage, especially when going out, and uses a pad for this. She denies any incontinence of bowel. She also reports itchy eyes due to allergies and occasional nasal congestion. She has not been taking any allergy medication recently. -BP noted to be low.  This is chronic since her chronic incomplete quadriplegia.  Denies any dizziness or fatigue The patient denies any chest pain, shortness of breath, chronic cough, or dizziness. She has not had a fever, and  her bowel movements are regular with the help of MiraLAX when needed. She denies any blood in the stools or urine.      Patient Active Problem List   Diagnosis Date Noted   Fall on same level 08/22/2022   Cervical spinal cord injury (HCC) 08/22/2022   Right foot drop 12/17/2021   Decreased range of motion of finger of right hand 09/12/2021   Chronic low back pain 12/01/2020   Mixed hyperlipidemia 10/11/2020   Gastric ulcer 10/11/2020   Spasticity 02/16/2020   Nerve pain 02/16/2020   Abnormality of gait 02/16/2020   Pressure injury of skin 01/15/2020   Transaminitis    Hyponatremia    Anemia    Neurogenic bowel 01/11/2020   Neurogenic bladder 01/11/2020   Malnutrition of moderate degree 01/01/2020   Poor dentition    Spinal stenosis of cervical region    Chronic incomplete quadriplegia (HCC)    Hypotension    Bacteremia    Syncope 12/19/2019     Current Outpatient Medications on File Prior to Visit  Medication Sig Dispense Refill   acetaminophen (TYLENOL) 325 MG tablet Take 2 tablets (650 mg total) by mouth every 4 (four) hours as needed for mild pain (temp > 101.5).     baclofen (LIORESAL) 10 MG tablet Take 1 tablet (10 mg total) by mouth 3 (three) times daily. For spasticity- no ringing in her ears on lower dose 90 each 5   cyclobenzaprine (FLEXERIL) 10 MG tablet Take 1 tablet (10 mg total) by mouth 2 (two) times daily as needed for muscle spasms. 60 tablet 2   ibuprofen (ADVIL) 600 MG  tablet Take 1 tablet (600 mg total) by mouth every 6 (six) hours as needed. 30 tablet 0   Multiple Vitamin (MULTIVITAMIN WITH MINERALS) TABS tablet Take 1 tablet by mouth daily.     solifenacin (VESICARE) 5 MG tablet Take 1 tablet (5 mg total) by mouth daily. 30 tablet 5   trimethoprim-polymyxin b (POLYTRIM) ophthalmic solution Place 1 drop into both eyes every 4 (four) hours. 10 mL 0   Current Facility-Administered Medications on File Prior to Visit  Medication Dose Route Frequency Provider  Last Rate Last Admin   sodium chloride (PF) 0.9 % injection 5 mL  5 mL Intravenous PRN    5 mL at 08/20/23 1517    Allergies  Allergen Reactions   Ampicillin Diarrhea   Baclofen Other (See Comments)    Ringing in the ears   Chlorhexidine     Social History   Socioeconomic History   Marital status: Married    Spouse name: Not on file   Number of children: 1   Years of education: Not on file   Highest education level: Some college, no degree  Occupational History   Not on file  Tobacco Use   Smoking status: Former   Smokeless tobacco: Never  Vaping Use   Vaping status: Never Used  Substance and Sexual Activity   Alcohol use: Yes    Comment: OCCASIONAL   Drug use: Not Currently   Sexual activity: Yes    Birth control/protection: Post-menopausal  Other Topics Concern   Not on file  Social History Narrative   Not on file   Social Drivers of Health   Financial Resource Strain: Low Risk  (10/13/2023)   Overall Financial Resource Strain (CARDIA)    Difficulty of Paying Living Expenses: Not very hard  Food Insecurity: Food Insecurity Present (10/13/2023)   Hunger Vital Sign    Worried About Running Out of Food in the Last Year: Never true    Ran Out of Food in the Last Year: Sometimes true  Transportation Needs: No Transportation Needs (10/13/2023)   PRAPARE - Administrator, Civil Service (Medical): No    Lack of Transportation (Non-Medical): No  Physical Activity: Inactive (10/13/2023)   Exercise Vital Sign    Days of Exercise per Week: 0 days    Minutes of Exercise per Session: 0 min  Stress: No Stress Concern Present (10/13/2023)   [employer] of Occupational Health - Occupational Stress Questionnaire    Feeling of Stress : Not at all  Social Connections: Moderately Isolated (10/13/2023)   Social Connection and Isolation Panel [NHANES]    Frequency of Communication with Friends and Family: Twice a week    Frequency of Social Gatherings with  Friends and Family: Twice a week    Attends Religious Services: Never    Database administrator or Organizations: No    Attends Banker Meetings: Never    Marital Status: Married  Catering manager Violence: Not At Risk (10/13/2023)   Humiliation, Afraid, Rape, and Kick questionnaire    Fear of Current or Ex-Partner: No    Emotionally Abused: No    Physically Abused: No    Sexually Abused: No    Family History  Problem Relation Age of Onset   Breast cancer Mother    Cancer Mother     Past Surgical History:  Procedure Laterality Date   ANTERIOR CERVICAL DECOMPRESSION/DISCECTOMY FUSION 4 LEVELS N/A 01/04/2020   Procedure: CERVICAL THREE- FOUR, CERVICAL FOUR-FIVE, CERVICAL FIVE- SIX,  CERVCAL SIX- SEVEN ANTERIOR CERVICAL DECOMPRESSION/DISCECTOMY FUSION;  Surgeon: Julio Sicks, MD;  Location[facility] OR;  Service: Neurosurgery;  Laterality: N/A;  CERVICAL THREE- FOUR, CERVICAL FOUR-FIVE, CERVICAL FIVE- SIX, CERVCAL SIX- SEVEN ANTERIOR CERVICAL DECOMPRESSION/DISCECTOMY FUSION   CESAREAN SECTION     ESOPHAGOGASTRODUODENOSCOPY (EGD) WITH PROPOFOL N/A 12/27/2019   Procedure: ESOPHAGOGASTRODUODENOSCOPY (EGD) WITH PROPOFOL;  Surgeon: Kerin Salen, MD;  Location: [facility] ENDOSCOPY;  Service: Gastroenterology;  Laterality: N/A;  PLEASE ALERT ANESTHESIA TO THE FACT THAT THE PATIENT HAS SUSTAINED A NECK INJURY AND HAS A QUADRIPARESIS   HEMOSTASIS CLIP PLACEMENT  12/27/2019   Procedure: HEMOSTASIS CLIP PLACEMENT;  Surgeon: Kerin Salen, MD;  Location: [facility] ENDOSCOPY;  Service: Gastroenterology;;   MULTIPLE EXTRACTIONS WITH ALVEOLOPLASTY N/A 12/30/2019   Procedure: Extraction of tooth #'s 2-8, 11-14, and 20-28 with alveoloplasty and bilateral mandibular lingual tori reductions.;  Surgeon: Charlynne Pander, DDS;  Location: [facility] OR;  Service: Oral Surgery;  Laterality: N/A;   SPINE SURGERY      ROS: Review of Systems  HENT:  Negative for congestion and hearing loss.   Eyes:        Wears reading  glasses.  Last eye exam was sometime last year.  Respiratory:  Negative for cough and shortness of breath.   Cardiovascular:  Negative for chest pain and palpitations.  Genitourinary:        Postmenopausal.  No issues with hot flashes.  Psychiatric/Behavioral:  Negative for dysphoric mood.    Negative except as stated above  PHYSICAL EXAM: BP 96/63   Pulse 93   Ht 5\' 7"  (1.702 m)   Wt 118 lb (53.5 kg)   SpO2 100%   BMI 18.48 kg/m   Wt Readings from Last 3 Encounters:  10/13/23 118 lb (53.5 kg)  09/05/23 117 lb (53.1 kg)  08/20/23 116 lb (52.6 kg)    Physical Exam  General appearance -middle-age African-American female in NAD.  She has temporal wasting.  This is chronic and unchanged.   Mental status -normal mood, behavior and speech.  She follows commands appropriately.  She is oriented x 3 Eyes -slight subconjunctival hemorrhage bilaterally greater on the left side medial to the iris Ears -she has moderate amount of wax buildup in both ears obscuring view of the ear canal and tympanic membrane Nose - normal and patent, no erythema, discharge or polyps Mouth - mucous membranes moist, pharynx normal without lesions. Dentures above and below. Neck - supple, no significant adenopathy Lymphatics - no palpable lymphadenopathy, no hepatosplenomegaly Chest - clear to auscultation, no wheezes, rales or rhonchi, symmetric air entry Heart - normal rate, regular rhythm, normal S1, S2, no murmurs, rubs, clicks or gallops Abdomen - soft, nontender, nondistended, no masses or organomegaly Neurological -gait is wide-based and stiff with low foot floor clearance.  She ambulates with a 4-prong cane.  She is able to get up on the exam table independently.  She has slight weakness in grip on the right side compared to the left.  Power in the upper extremities proximally and distally 5/5 bilaterally.  Power in the lower extremities 4+/5 with spasticity.  She has contractures of the third fourth and  fifth fingers of the right hand.  She has wasting of intrinsic muscles of the right hand. Extremities - peripheral pulses normal, no pedal edema, no clubbing or cyanosis    10/13/2023    4:02 PM 09/05/2023    2:57 PM 05/26/2023    1:53 PM  Depression screen PHQ 2/9  Decreased Interest 2  0 0  Down, Depressed, Hopeless 0 0 0  PHQ - 2 Score 2 0 0  Altered sleeping 0    Tired, decreased energy 2    Change in appetite 0    Feeling bad or failure about yourself  0    Trouble concentrating 0    Moving slowly or fidgety/restless 0    Suicidal thoughts 0    PHQ-9 Score 4    Difficult doing work/chores Not difficult at all         10/13/2023    4:03 PM 02/25/2023    3:49 PM 11/14/2022    2:06 PM 08/22/2022    2:33 PM  GAD 7 : Generalized Anxiety Score  Nervous, Anxious, on Edge 0 1 0 0  Control/stop worrying 0 0 0 0  Worry too much - different things 0 0 0 0  Trouble relaxing 0 0 0 0  Restless 0 0 0 0  Easily annoyed or irritable 0 0 0 0  Afraid - awful might happen 0 0 0 0  Total GAD 7 Score 0 1 0 0  Anxiety Difficulty Somewhat difficult           Latest Ref Rng & Units 06/14/2022    2:45 PM 12/30/2020    8:15 PM 08/18/2020    3:20 PM  CMP  Glucose 70 - 99 mg/dL 98  96  77   BUN 6 - 24 mg/dL 17  25  12    Creatinine 0.57 - 1.00 mg/dL 4.09  8.11  9.14   Sodium 134 - 144 mmol/L 141  141  142   Potassium 3.5 - 5.2 mmol/L 3.9  4.3  4.1   Chloride 96 - 106 mmol/L 101  103  104   CO2 20 - 29 mmol/L 19  26  26    Calcium 8.7 - 10.2 mg/dL 9.9  78.2  9.6   Total Protein 6.0 - 8.5 g/dL 7.1  8.0  6.7   Total Bilirubin 0.0 - 1.2 mg/dL 0.6  0.8  0.4   Alkaline Phos 44 - 121 IU/L 130  96  114   AST 0 - 40 IU/L 22  20  24    ALT 0 - 32 IU/L 8  11  13     Lipid Panel  No results found for: "CHOL", "TRIG", "HDL", "CHOLHDL", "VLDL", "LDLCALC", "LDLDIRECT"  CBC    Component Value Date/Time   WBC 5.5 06/14/2022 1445   WBC 5.8 12/30/2020 2015   RBC 4.18 06/14/2022 1445   RBC 4.71  12/30/2020 2015   HGB 12.2 06/14/2022 1445   HCT 38.0 06/14/2022 1445   PLT 269 06/14/2022 1445   MCV 91 06/14/2022 1445   MCH 29.2 06/14/2022 1445   MCH 29.5 12/30/2020 2015   MCHC 32.1 06/14/2022 1445   MCHC 32.9 12/30/2020 2015   RDW 11.8 06/14/2022 1445   LYMPHSABS 2.0 12/30/2020 2015   MONOABS 0.4 12/30/2020 2015   EOSABS 0.1 12/30/2020 2015   BASOSABS 0.0 12/30/2020 2015    ASSESSMENT AND PLAN: 1. Annual physical exam (Primary)  - CBC - Comprehensive metabolic panel - Lipid panel  2. Underweight Patient to continue eating either 3 large meals a day with snacks in between the meals or 5 smaller meals.  She will continue to supplement her meals with Ensure - CBC - Comprehensive metabolic panel  3. Neurogenic bladder Wears pads when leaving the house.  4. Other specified hypotension This is chronic.  Repeat blood pressure today is better.  She is asymptomatic.  5. Subconjunctival hemorrhage of both eyes Very small.  Discussed diagnosis with patient.  No treatment needed at this time.  She denies any blurred vision  6. Allergic conjunctivitis of both eyes Rxn for Pantanol to use PRN - olopatadine (PATANOL) 0.1 % ophthalmic solution; Place 1 drop into both eyes 2 (two) times daily as needed for allergies (for allergy symptoms).  Dispense: 5 mL; Refill: 0  7. Bilateral impacted cerumen - Ambulatory referral to ENT  8. Spasticity Continue Baclofen   9. Chronic incomplete quadriplegia (HCC) Followed by PMR. Continue to use her cane.  Reports no falls  10. Encounter for screening mammogram for malignant neoplasm of breast - MM Digital Screening; Future  11. Encounter for immunization - Flu vaccine trivalent PF, 6mos and older(Flulaval,Afluria,Fluarix,Fluzone)    Patient was given the opportunity to ask questions.  Patient verbalized understanding of the plan and was able to repeat key elements of the plan.   This documentation was completed using Social research officer, government.  Any transcriptional errors are unintentional.  Orders Placed This Encounter  Procedures   MM Digital Screening   Flu vaccine trivalent PF, 6mos and older(Flulaval,Afluria,Fluarix,Fluzone)   CBC   Comprehensive metabolic panel   Lipid panel   Ambulatory referral to ENT     Requested Prescriptions   Signed Prescriptions Disp Refills   olopatadine (PATANOL) 0.1 % ophthalmic solution 5 mL 0    Sig: Place 1 drop into both eyes 2 (two) times daily as needed for allergies (for allergy symptoms).    Return in about 6 months (around 04/12/2024).  Jonah Blue, MD, FACP

## 2023-10-13 NOTE — Patient Instructions (Addendum)
We have submitted a referral for your mammogram. We have submitted a referral for you to see the ear nose and throat specialist  Preventive Care 56-56 Years Old, Female Preventive care refers to lifestyle choices and visits with your health care provider that can promote health and wellness. Preventive care visits are also called wellness exams. What can I expect for my preventive care visit? Counseling Your health care provider may ask you questions about your: Medical history, including: Past medical problems. Family medical history. Pregnancy history. Current health, including: Menstrual cycle. Method of birth control. Emotional well-being. Home life and relationship well-being. Sexual activity and sexual health. Lifestyle, including: Alcohol, nicotine or tobacco, and drug use. Access to firearms. Diet, exercise, and sleep habits. Work and work Astronomer. Sunscreen use. Safety issues such as seatbelt and bike helmet use. Physical exam Your health care provider will check your: Height and weight. These may be used to calculate your BMI (body mass index). BMI is a measurement that tells if you are at a healthy weight. Waist circumference. This measures the distance around your waistline. This measurement also tells if you are at a healthy weight and may help predict your risk of certain diseases, such as type 2 diabetes and high blood pressure. Heart rate and blood pressure. Body temperature. Skin for abnormal spots. What immunizations do I need?  Vaccines are usually given at various ages, according to a schedule. Your health care provider will recommend vaccines for you based on your age, medical history, and lifestyle or other factors, such as travel or where you work. What tests do I need? Screening Your health care provider may recommend screening tests for certain conditions. This may include: Lipid and cholesterol levels. Diabetes screening. This is done by checking  your blood sugar (glucose) after you have not eaten for a while (fasting). Pelvic exam and Pap test. Hepatitis B test. Hepatitis C test. HIV (human immunodeficiency virus) test. STI (sexually transmitted infection) testing, if you are at risk. Lung cancer screening. Colorectal cancer screening. Mammogram. Talk with your health care provider about when you should start having regular mammograms. This may depend on whether you have a family history of breast cancer. BRCA-related cancer screening. This may be done if you have a family history of breast, ovarian, tubal, or peritoneal cancers. Bone density scan. This is done to screen for osteoporosis. Talk with your health care provider about your test results, treatment options, and if necessary, the need for more tests. Follow these instructions at home: Eating and drinking  Eat a diet that includes fresh fruits and vegetables, whole grains, lean protein, and low-fat dairy products. Take vitamin and mineral supplements as recommended by your health care provider. Do not drink alcohol if: Your health care provider tells you not to drink. You are pregnant, may be pregnant, or are planning to become pregnant. If you drink alcohol: Limit how much you have to 0-1 drink a day. Know how much alcohol is in your drink. In the [place] one drink equals one 12 oz bottle of beer (355 mL), one 5 oz glass of wine (148 mL), or one 1 oz glass of hard liquor (44 mL). Lifestyle Brush your teeth every morning and night with fluoride toothpaste. Floss one time each day. Exercise for at least 30 minutes 5 or more days each week. Do not use any products that contain nicotine or tobacco. These products include cigarettes, chewing tobacco, and vaping devices, such as e-cigarettes. If you need help quitting, ask your  health care provider. Do not use drugs. If you are sexually active, practice safe sex. Use a condom or other form of protection to prevent STIs. If you  do not wish to become pregnant, use a form of birth control. If you plan to become pregnant, see your health care provider for a prepregnancy visit. Take aspirin only as told by your health care provider. Make sure that you understand how much to take and what form to take. Work with your health care provider to find out whether it is safe and beneficial for you to take aspirin daily. Find healthy ways to manage stress, such as: Meditation, yoga, or listening to music. Journaling. Talking to a trusted person. Spending time with friends and family. Minimize exposure to UV radiation to reduce your risk of skin cancer. Safety Always wear your seat belt while driving or riding in a vehicle. Do not drive: If you have been drinking alcohol. Do not ride with someone who has been drinking. When you are tired or distracted. While texting. If you have been using any mind-altering substances or drugs. Wear a helmet and other protective equipment during sports activities. If you have firearms in your house, make sure you follow all gun safety procedures. Seek help if you have been physically or sexually abused. What's next? Visit your health care provider once a year for an annual wellness visit. Ask your health care provider how often you should have your eyes and teeth checked. Stay up to date on all vaccines. This information is not intended to replace advice given to you by your health care provider. Make sure you discuss any questions you have with your health care provider. Document Revised: 04/04/2021 Document Reviewed: 04/04/2021 Elsevier Patient Education  2024 [employer].

## 2023-10-14 LAB — COMPREHENSIVE METABOLIC PANEL
ALT: 14 [IU]/L (ref 0–32)
AST: 26 [IU]/L (ref 0–40)
Albumin: 4.4 g/dL (ref 3.8–4.9)
Alkaline Phosphatase: 152 [IU]/L — ABNORMAL HIGH (ref 44–121)
BUN/Creatinine Ratio: 22 (ref 9–23)
BUN: 18 mg/dL (ref 6–24)
Bilirubin Total: 0.5 mg/dL (ref 0.0–1.2)
CO2: 23 mmol/L (ref 20–29)
Calcium: 9.9 mg/dL (ref 8.7–10.2)
Chloride: 102 mmol/L (ref 96–106)
Creatinine, Ser: 0.82 mg/dL (ref 0.57–1.00)
Globulin, Total: 2.8 g/dL (ref 1.5–4.5)
Glucose: 86 mg/dL (ref 70–99)
Potassium: 4 mmol/L (ref 3.5–5.2)
Sodium: 142 mmol/L (ref 134–144)
Total Protein: 7.2 g/dL (ref 6.0–8.5)
eGFR: 84 mL/min/{1.73_m2} (ref >59)

## 2023-10-14 LAB — CBC
Hematocrit: 38.8 % (ref 34.0–46.6)
Hemoglobin: 12.5 g/dL (ref 11.1–15.9)
MCH: 29.7 pg (ref 26.6–33.0)
MCHC: 32.2 g/dL (ref 31.5–35.7)
MCV: 92 fL (ref 79–97)
Platelets: 255 10*3/uL (ref 150–450)
RBC: 4.21 x10E6/uL (ref 3.77–5.28)
RDW: 11.5 % — ABNORMAL LOW (ref 11.7–15.4)
WBC: 4.9 10*3/uL (ref 3.4–10.8)

## 2023-10-14 LAB — LIPID PANEL
Chol/HDL Ratio: 3.3 {ratio} (ref 0.0–4.4)
Cholesterol, Total: 213 mg/dL — ABNORMAL HIGH (ref 100–199)
HDL: 65 mg/dL (ref >39)
LDL Chol Calc (NIH): 135 mg/dL — ABNORMAL HIGH (ref 0–99)
Triglycerides: 75 mg/dL (ref 0–149)
VLDL Cholesterol Cal: 13 mg/dL (ref 5–40)

## 2023-10-16 ENCOUNTER — Encounter: Payer: Self-pay | Admitting: Internal Medicine

## 2023-10-16 NOTE — Addendum Note (Signed)
Addended by: Jonah Blue B on: 10/16/2023 08:50 AM   Modules accepted: Orders

## 2023-10-28 ENCOUNTER — Ambulatory Visit: Payer: Medicare (Managed Care) | Attending: Internal Medicine

## 2023-10-28 DIAGNOSIS — Z0000 Encounter for general adult medical examination without abnormal findings: Secondary | ICD-10-CM

## 2023-10-28 NOTE — Progress Notes (Unsigned)
 Subjective:   Anna Campos is a 58 y.o. female who presents for Medicare Annual (Subsequent) preventive examination.  Visit Complete: Virtual I connected with  Anna Campos on 10/28/23 by a audio enabled telemedicine application and verified that I am speaking with the correct person using two identifiers.  Patient Location: Home  Provider Location: Home Office  I discussed the limitations of evaluation and management by telemedicine. The patient expressed understanding and agreed to proceed.  Vital Signs: Because this visit was a virtual/telehealth visit, some criteria may be missing or patient reported. Any vitals not documented were not able to be obtained and vitals that have been documented are patient reported.  Unable to do Video appointment  Cardiac Risk Factors include: advanced age (>39men, >62 women)     Objective:    There were no vitals filed for this visit. There is no height or weight on file to calculate BMI.     10/28/2023    3:24 PM 04/08/2023    2:52 PM 10/10/2022    2:43 PM 01/11/2020    4:45 PM 12/21/2019    8:27 AM 12/18/2019   11:13 PM  Advanced Directives  Does Patient Have a Medical Advance Directive? No No No No  No  Would patient like information on creating a medical advance directive? No - Patient declined No - Patient declined Yes (ED - Information included in AVS) No - Patient declined No - Patient declined No - Patient declined    Current Medications (verified) Outpatient Encounter Medications as of 10/28/2023  Medication Sig   acetaminophen  (TYLENOL ) 325 MG tablet Take 2 tablets (650 mg total) by mouth every 4 (four) hours as needed for mild pain (temp > 101.5).   baclofen  (LIORESAL ) 10 MG tablet Take 1 tablet (10 mg total) by mouth 3 (three) times daily. For spasticity- no ringing in her ears on lower dose   cyclobenzaprine  (FLEXERIL ) 10 MG tablet Take 1 tablet (10 mg total) by mouth 2 (two) times daily as needed for muscle spasms.   ibuprofen   (ADVIL ) 600 MG tablet Take 1 tablet (600 mg total) by mouth every 6 (six) hours as needed.   Multiple Vitamin (MULTIVITAMIN WITH MINERALS) TABS tablet Take 1 tablet by mouth daily.   olopatadine  (PATANOL) 0.1 % ophthalmic solution Place 1 drop into both eyes 2 (two) times daily as needed for allergies (for allergy symptoms).   solifenacin  (VESICARE ) 5 MG tablet Take 1 tablet (5 mg total) by mouth daily.   trimethoprim -polymyxin b  (POLYTRIM ) ophthalmic solution Place 1 drop into both eyes every 4 (four) hours.   Facility-Administered Encounter Medications as of 10/28/2023  Medication   sodium chloride  (PF) 0.9 % injection 5 mL    Allergies (verified) Ampicillin, Baclofen , and Chlorhexidine    History: Past Medical History:  Diagnosis Date   Blood transfusion without reported diagnosis 12/2019   Genital herpes    PE (pulmonary thromboembolism) (HCC) 12/20/2019   Syncope 12/20/2019   Past Surgical History:  Procedure Laterality Date   ANTERIOR CERVICAL DECOMPRESSION/DISCECTOMY FUSION 4 LEVELS N/A 01/04/2020   Procedure: CERVICAL THREE- FOUR, CERVICAL FOUR-FIVE, CERVICAL FIVE- SIX, CERVCAL SIX- SEVEN ANTERIOR CERVICAL DECOMPRESSION/DISCECTOMY FUSION;  Surgeon: Louis Shove, MD;  Location: [facility] OR;  Service: Neurosurgery;  Laterality: N/A;  CERVICAL THREE- FOUR, CERVICAL FOUR-FIVE, CERVICAL FIVE- SIX, CERVCAL SIX- SEVEN ANTERIOR CERVICAL DECOMPRESSION/DISCECTOMY FUSION   CESAREAN SECTION     ESOPHAGOGASTRODUODENOSCOPY (EGD) WITH PROPOFOL  N/A 12/27/2019   Procedure: ESOPHAGOGASTRODUODENOSCOPY (EGD) WITH PROPOFOL ;  Surgeon: Saintclair Jasper, MD;  Location: [facility] ENDOSCOPY;  Service: Gastroenterology;  Laterality: N/A;  PLEASE ALERT ANESTHESIA TO THE FACT THAT THE PATIENT HAS SUSTAINED A NECK INJURY AND HAS A QUADRIPARESIS   HEMOSTASIS CLIP PLACEMENT  12/27/2019   Procedure: HEMOSTASIS CLIP PLACEMENT;  Surgeon: Saintclair Jasper, MD;  Location: [facility] ENDOSCOPY;  Service: Gastroenterology;;   MULTIPLE EXTRACTIONS WITH  ALVEOLOPLASTY N/A 12/30/2019   Procedure: Extraction of tooth #'s 2-8, 11-14, and 20-28 with alveoloplasty and bilateral mandibular lingual tori reductions.;  Surgeon: Cyndee Tanda FALCON, DDS;  Location: [facility] OR;  Service: Oral Surgery;  Laterality: N/A;   SPINE SURGERY     Family History  Problem Relation Age of Onset   Breast cancer Mother    Cancer Mother    Social History   Socioeconomic History   Marital status: Married    Spouse name: Not on file   Number of children: 1   Years of education: Not on file   Highest education level: Some college, no degree  Occupational History   Not on file  Tobacco Use   Smoking status: Former   Smokeless tobacco: Never  Vaping Use   Vaping status: Never Used  Substance and Sexual Activity   Alcohol use: Yes    Comment: OCCASIONAL   Drug use: Not Currently   Sexual activity: Yes    Birth control/protection: Post-menopausal  Other Topics Concern   Not on file  Social History Narrative   Not on file   Social Drivers of Health   Financial Resource Strain: Low Risk  (10/28/2023)   Overall Financial Resource Strain (CARDIA)    Difficulty of Paying Living Expenses: Not hard at all  Food Insecurity: Food Insecurity Present (10/28/2023)   Hunger Vital Sign    Worried About Running Out of Food in the Last Year: Sometimes true    Ran Out of Food in the Last Year: Sometimes true  Transportation Needs: No Transportation Needs (10/28/2023)   PRAPARE - Administrator, Civil Service (Medical): No    Lack of Transportation (Non-Medical): No  Physical Activity: Inactive (10/28/2023)   Exercise Vital Sign    Days of Exercise per Week: 0 days    Minutes of Exercise per Session: 0 min  Stress: No Stress Concern Present (10/28/2023)   [employer] of Occupational Health - Occupational Stress Questionnaire    Feeling of Stress : Not at all  Social Connections: Moderately Isolated (10/28/2023)   Social Connection and Isolation Panel [NHANES]     Frequency of Communication with Friends and Family: Three times a week    Frequency of Social Gatherings with Friends and Family: Once a week    Attends Religious Services: Never    Database Administrator or Organizations: No    Attends Engineer, Structural: Never    Marital Status: Married    Tobacco Counseling Counseling given: Not Answered   Clinical Intake:  Pre-visit preparation completed: Yes  Pain : No/denies pain     Diabetes: No  How often do you need to have someone help you when you read instructions, pamphlets, or other written materials from your doctor or pharmacy?: 1 - Never  Interpreter Needed?: No  Information entered by :: Mliss Graff LPN   Activities of Daily Living    10/28/2023    3:18 PM  In your present state of health, do you have any difficulty performing the following activities:  Hearing? 0  Vision? 0  Difficulty concentrating or making decisions? 0  Walking or  climbing stairs? 1  Dressing or bathing? 0  Doing errands, shopping? 0  Preparing Food and eating ? N  Using the Toilet? N  In the past six months, have you accidently leaked urine? Y  Do you have problems with loss of bowel control? N  Managing your Medications? N  Managing your Finances? N  Housekeeping or managing your Housekeeping? N    Patient Care Team: Vicci Barnie NOVAK, MD as PCP - General (Internal Medicine)  Indicate any recent Medical Services you may have received from other than [facility] providers in the past year (date may be approximate).     Assessment:   This is a routine wellness examination for Anna Campos.  Hearing/Vision screen Hearing Screening - Comments:: No trouble hearing Vision Screening - Comments:: Up to date Every 2 years [employer]   Goals Addressed             This Visit's Progress    Patient Stated       Continue current lifestyle       Depression Screen    10/28/2023    3:23 PM 10/13/2023    4:02 PM  09/05/2023    2:57 PM 05/26/2023    1:53 PM 05/14/2023    1:46 PM 02/25/2023    3:48 PM 02/24/2023    1:48 PM  PHQ 2/9 Scores  PHQ - 2 Score 0 2 0 0 0 0 0  PHQ- 9 Score 0 4    2     Fall Risk    10/28/2023    3:18 PM 09/05/2023    2:57 PM 05/26/2023    1:53 PM 05/14/2023    1:46 PM 02/24/2023    1:48 PM  Fall Risk   Falls in the past year? 0 0 0 0 1  Number falls in past yr: 0 0 0 0 0  Comment     no new fall  Injury with Fall? 0    0  Risk for fall due to :    Impaired balance/gait;History of fall(s) History of fall(s);Impaired balance/gait  Follow up Falls evaluation completed;Education provided;Falls prevention discussed        MEDICARE RISK AT HOME: Medicare Risk at Home Any stairs in or around the home?: Yes If so, are there any without handrails?: No Home free of loose throw rugs in walkways, pet beds, electrical cords, etc?: Yes Adequate lighting in your home to reduce risk of falls?: Yes Life alert?: No Use of a cane, walker or w/c?: Yes Grab bars in the bathroom?: No Shower chair or bench in shower?: No Elevated toilet seat or a handicapped toilet?: No  TIMED UP AND GO:  Was the test performed?  {[phone]}    Cognitive Function:    10/10/2022    3:52 PM  MMSE - Mini Mental State Exam  Orientation to time 5  Orientation to Place 5  Registration 3  Attention/ Calculation 5  Recall 3  Language- name 2 objects 2  Language- repeat 1  Language- follow 3 step command 3  Language- read & follow direction 1  Write a sentence 1  Copy design 1  Total score 30        10/28/2023    3:20 PM 10/10/2022    2:45 PM  6CIT Screen  What Year? 0 points 0 points  What month? 0 points 0 points  What time? 0 points 0 points  Count back from 20 0 points 0 points  Months in reverse  0 points 0 points  Repeat phrase 0 points 0 points  Total Score 0 points 0 points    Immunizations Immunization History  Administered Date(s) Administered   Influenza, Seasonal,  Injecte, Preservative Fre 10/13/2023   Influenza,inj,Quad PF,6+ Mos 08/18/2020, 08/23/2021, 10/10/2022   PFIZER(Purple Top)SARS-COV-2 Vaccination 02/10/2020, 03/03/2020   PNEUMOCOCCAL CONJUGATE-20 08/23/2021   Tdap 10/21/2014    {TDAP status:2101805}  {Flu Vaccine status:2101806}  {Pneumococcal vaccine status:2101807}  {Covid-19 vaccine status:2101808}  Qualifies for Shingles Vaccine? {YES/NO:21197}  Zostavax completed {YES/NO:21197}  {Shingrix Completed?:2101804}  Screening Tests Health Maintenance  Topic Date Due   COVID-19 Vaccine (3 - Pfizer risk series) 11/13/2023 (Originally 03/31/2020)   Zoster Vaccines- Shingrix (1 of 2) 01/11/2024 (Originally 10/17/1986)   DTaP/Tdap/Td (2 - Td or Tdap) 10/21/2024   Medicare Annual Wellness (AWV)  10/27/2024   MAMMOGRAM  11/18/2024   Cervical Cancer Screening (HPV/Pap Cotest)  11/15/2027   Colonoscopy  05/11/2028   INFLUENZA VACCINE  Completed   Hepatitis C Screening  Completed   HIV Screening  Completed   HPV VACCINES  Aged Out    Health Maintenance  There are no preventive care reminders to display for this patient.   {Colorectal cancer screening:2101809}  {Mammogram status:21018020}  {Bone Density status:21018021}  Lung Cancer Screening: (Low Dose CT Chest recommended if Age 66-80 years, 20 pack-year currently smoking OR have quit w/in 15years.) {DOES NOT does:27190::does not} qualify.   Lung Cancer Screening Referral: ***  Additional Screening:  Hepatitis C Screening: {DOES NOT does:27190::does not} qualify; Completed ***  Vision Screening: Recommended annual ophthalmology exams for early detection of glaucoma and other disorders of the eye. Is the patient up to date with their annual eye exam?  {YES/NO:21197} Who is the provider or what is the name of the office in which the patient attends annual eye exams? *** If pt is not established with a provider, would they like to be referred to a provider to establish  care? {YES/NO:21197}.   Dental Screening: Recommended annual dental exams for proper oral hygiene  Diabetic Foot Exam: {Diabetic Foot Exam:2101802}  Community Resource Referral / Chronic Care Management: CRR required this visit?  {YES/NO:21197}  CCM required this visit?  {CCM Required choices:[phone]}     Plan:     I have personally reviewed and noted the following in the patient's chart:   Medical and social history Use of alcohol, tobacco or illicit drugs  Current medications and supplements including opioid prescriptions. {Opioid Prescriptions:[phone]} Functional ability and status Nutritional status Physical activity Advanced directives List of other physicians Hospitalizations, surgeries, and ER visits in previous 12 months Vitals Screenings to include cognitive, depression, and falls Referrals and appointments  In addition, I have reviewed and discussed with patient certain preventive protocols, quality metrics, and best practice recommendations. A written personalized care plan for preventive services as well as general preventive health recommendations were provided to patient.     Mliss Graff, LPN   05/22/7973   After Visit Summary: {CHL AMB AWV After Visit Summary:[phone]}  Nurse Notes: ***

## 2023-10-30 ENCOUNTER — Encounter (INDEPENDENT_AMBULATORY_CARE_PROVIDER_SITE_OTHER): Payer: Self-pay | Admitting: Otolaryngology

## 2023-11-06 ENCOUNTER — Encounter (INDEPENDENT_AMBULATORY_CARE_PROVIDER_SITE_OTHER): Payer: Self-pay | Admitting: Otolaryngology

## 2023-11-20 ENCOUNTER — Ambulatory Visit
Admission: RE | Admit: 2023-11-20 | Discharge: 2023-11-20 | Disposition: A | Discharge status: Home / Self Care | Payer: Medicare (Managed Care) | Source: Ambulatory Visit | Attending: Internal Medicine | Admitting: Internal Medicine

## 2023-11-20 DIAGNOSIS — Z1231 Encounter for screening mammogram for malignant neoplasm of breast: Secondary | ICD-10-CM

## 2023-11-24 ENCOUNTER — Encounter: Payer: Self-pay | Admitting: Internal Medicine

## 2023-11-26 ENCOUNTER — Ambulatory Visit: Payer: Medicare (Managed Care) | Admitting: Physical Medicine and Rehabilitation

## 2023-12-01 ENCOUNTER — Other Ambulatory Visit: Payer: Self-pay | Admitting: Internal Medicine

## 2023-12-01 MED ORDER — IBUPROFEN 600 MG PO TABS: 600.0000 mg | ORAL_TABLET | Freq: Four times a day (QID) | ORAL | 0 refills | Status: DC | PRN

## 2023-12-01 NOTE — Telephone Encounter (Signed)
 Last Fill: 02/13/23  Last OV: 10/28/23 AWV Next OV: 04/12/24  Routing to provider for review/authorization.

## 2023-12-01 NOTE — Telephone Encounter (Signed)
 Copied from CRM [id number]. Topic: Clinical - Medication Refill >> Dec 01, 2023 10:47 AM Elle L wrote: Most Recent Primary Care Visit:  Provider: Meridith Stanford VISIT  Department: [facility]  Visit Type: MEDICARE AWV, SEQUENTIAL  Date: 10/28/2023  Medication: ibuprofen  (ADVIL ) 600 MG tablet  Has the patient contacted their pharmacy? Yes (Agent: If no, request that the patient contact the pharmacy for the refill. If patient does not wish to contact the pharmacy document the reason why and proceed with request.) (Agent: If yes, when and what did the pharmacy advise?)  Is this the correct pharmacy for this prescription? Yes If no, delete pharmacy and type the correct one.  This is the patient's preferred pharmacy:  [id number] - [place] ([place]), [place] - [address] [place] ([place]) [place] [address] Phone: [phone] Fax: [phone]   Has the prescription been filled recently? No  Is the patient out of the medication? Yes  Has the patient been seen for an appointment in the last year OR does the patient have an upcoming appointment? Yes  Can we respond through MyChart? No   Agent: Please be advised that Rx refills may take up to 3 business days. We ask that you follow-up with your pharmacy.

## 2023-12-02 ENCOUNTER — Ambulatory Visit: Payer: Medicare (Managed Care)

## 2023-12-12 ENCOUNTER — Ambulatory Visit: Payer: Medicare (Managed Care) | Admitting: Physical Medicine and Rehabilitation

## 2024-03-29 ENCOUNTER — Encounter (INDEPENDENT_AMBULATORY_CARE_PROVIDER_SITE_OTHER): Payer: Self-pay | Admitting: Otolaryngology

## 2024-03-29 ENCOUNTER — Ambulatory Visit (INDEPENDENT_AMBULATORY_CARE_PROVIDER_SITE_OTHER): Payer: Medicare (Managed Care) | Admitting: Otolaryngology

## 2024-03-29 VITALS — BP 113/75 | HR 74 | Ht 68.0 in | Wt 118.0 lb

## 2024-03-29 DIAGNOSIS — H6123 Impacted cerumen, bilateral: Secondary | ICD-10-CM | POA: Diagnosis not present

## 2024-03-30 DIAGNOSIS — H6123 Impacted cerumen, bilateral: Secondary | ICD-10-CM | POA: Insufficient documentation

## 2024-03-30 NOTE — Progress Notes (Signed)
 Patient ID: Anna Campos, female   DOB: [birth year], 58 y.o.   MRN: [id number]  Procedure: Bilateral cerumen disimpaction.   Indication: Cerumen impaction, resulting in ear discomfort and conductive hearing loss.   Description: The patient is placed supine on the operating table. Under the operating microscope, the right ear canal is examined and is noted to be impacted with cerumen. The cerumen is carefully removed with a combination of suction catheters, cerumen curette, and alligator forceps. After the cerumen removal, the ear canal and tympanic membrane are noted to be normal. No middle ear effusion is noted. The same procedure is then repeated on the left side without exception. The patient tolerated the procedure well.  Follow-up care:  The patient is instructed not to use Q-tips to clean the ear canals. The patient will follow up in 12 months.

## 2024-04-12 ENCOUNTER — Encounter: Payer: Self-pay | Admitting: Internal Medicine

## 2024-04-12 ENCOUNTER — Ambulatory Visit: Payer: Medicare (Managed Care) | Attending: Internal Medicine | Admitting: Internal Medicine

## 2024-04-12 VITALS — BP 105/70 | HR 74 | Temp 98.1°F | Ht 68.0 in | Wt 113.0 lb

## 2024-04-12 DIAGNOSIS — G8250 Quadriplegia, unspecified: Secondary | ICD-10-CM | POA: Diagnosis not present

## 2024-04-12 DIAGNOSIS — M545 Low back pain, unspecified: Secondary | ICD-10-CM | POA: Diagnosis not present

## 2024-04-12 DIAGNOSIS — N319 Neuromuscular dysfunction of bladder, unspecified: Secondary | ICD-10-CM | POA: Diagnosis not present

## 2024-04-12 DIAGNOSIS — E782 Mixed hyperlipidemia: Secondary | ICD-10-CM

## 2024-04-12 DIAGNOSIS — N898 Other specified noninflammatory disorders of vagina: Secondary | ICD-10-CM

## 2024-04-12 DIAGNOSIS — Z2821 Immunization not carried out because of patient refusal: Secondary | ICD-10-CM | POA: Diagnosis not present

## 2024-04-12 DIAGNOSIS — H1013 Acute atopic conjunctivitis, bilateral: Secondary | ICD-10-CM | POA: Diagnosis not present

## 2024-04-12 DIAGNOSIS — R636 Underweight: Secondary | ICD-10-CM

## 2024-04-12 MED ORDER — SOLIFENACIN SUCCINATE 5 MG PO TABS: 5.0000 mg | ORAL_TABLET | Freq: Every day | ORAL | 5 refills | Status: DC

## 2024-04-12 MED ORDER — OLOPATADINE HCL 0.1 % OP SOLN: 1.0000 [drp] | Freq: Two times a day (BID) | OPHTHALMIC | 0 refills | Status: DC | PRN

## 2024-04-12 NOTE — Progress Notes (Signed)
 Patient ID: Anna Campos, female    DOB: May 25, 1966  MRN: [id number]  CC: Follow-up (Follow-up. Med refill. /L lower back pain X2 days )   Subjective: Anna Campos is a 58 y.o. female who presents for chronic ds management. Her concerns today include:  Patient with history of incomplete quadriplegia at C5-6 level, neurogenic bowel, neurogenic bladder, nerve pain, anemia, hypotension (was on Florinef ), gastric ulcers, PE in the setting of acute illness, HL, genital herpes.    Discussed the use of AI scribe software for clinical note transcription with the patient, who gave verbal consent to proceed.  History of Present Illness Anna Campos is a 58 year old female who presents with concerns about weight loss and a fishy vaginal odor after eating seafood.  She experiences a fishy vaginal odor consistently after consuming seafood, lasting for a couple of days, without associated discharge or infections.  Her weight has decreased from 118 pounds to 113 pounds over the past six months. Her appetite varies, and she sometimes skips meals, supplementing her diet with Ensure shakes.Wonders about getting a med to increase appetite. She wondered about her cholesterol from last visit.  Her LDL cholesterol was 135 with total cholesterol of 213.  I had advised that she avoid eating fried food and skin of the chicken and to cook with healthy oils like olive oil.  She has been doing so.  She has incomplete quadriparesis and takes baclofen  for spasticity. She uses a cane for mobility and has not f/u with PMR Dr. Lovern recently due to financial constraints.  She has not had any falls.  She continues to have issues with her bladder due to neurogenic bladder.  Request refill on Vesicare .  She recently developed constant left-sided back pain after sleeping on a hard bed this past weekend. Pain does not radiate; no numbness or tingling in the legs. She takes 600 mg of Advil  nightly and uses a heating pad, but  the pain persists.    Patient Active Problem List   Diagnosis Date Noted   Impacted cerumen of both ears 03/30/2024   Fall on same level 08/22/2022   Cervical spinal cord injury (HCC) 08/22/2022   Right foot drop 12/17/2021   Decreased range of motion of finger of right hand 09/12/2021   Chronic low back pain 12/01/2020   Mixed hyperlipidemia 10/11/2020   Gastric ulcer 10/11/2020   Spasticity 02/16/2020   Nerve pain 02/16/2020   Abnormality of gait 02/16/2020   Pressure injury of skin 01/15/2020   Transaminitis    Hyponatremia    Anemia    Neurogenic bowel 01/11/2020   Neurogenic bladder 01/11/2020   Malnutrition of moderate degree 01/01/2020   Poor dentition    Spinal stenosis of cervical region    Chronic incomplete quadriplegia (HCC)    Hypotension    Bacteremia    Syncope 12/19/2019     Current Outpatient Medications on File Prior to Visit  Medication Sig Dispense Refill   acetaminophen  (TYLENOL ) 325 MG tablet Take 2 tablets (650 mg total) by mouth every 4 (four) hours as needed for mild pain (temp > 101.5).     baclofen  (LIORESAL ) 10 MG tablet Take 1 tablet (10 mg total) by mouth 3 (three) times daily. For spasticity- no ringing in her ears on lower dose 90 each 5   cyclobenzaprine  (FLEXERIL ) 10 MG tablet Take 1 tablet (10 mg total) by mouth 2 (two) times daily as needed for muscle spasms. 60 tablet 2  ibuprofen  (ADVIL ) 600 MG tablet Take 1 tablet (600 mg total) by mouth every 6 (six) hours as needed. 30 tablet 0   Multiple Vitamin (MULTIVITAMIN WITH MINERALS) TABS tablet Take 1 tablet by mouth daily. (Patient not taking: Reported on 04/12/2024)     trimethoprim -polymyxin b  (POLYTRIM ) ophthalmic solution Place 1 drop into both eyes every 4 (four) hours. (Patient not taking: Reported on 04/12/2024) 10 mL 0   Current Facility-Administered Medications on File Prior to Visit  Medication Dose Route Frequency Provider Last Rate Last Admin   sodium chloride  (PF) 0.9 %  injection 5 mL  5 mL Intravenous PRN    5 mL at 08/20/23 1517    Allergies  Allergen Reactions   Ampicillin Diarrhea   Baclofen  Other (See Comments)    Ringing in the ears   Chlorhexidine      Social History   Socioeconomic History   Marital status: Married    Spouse name: Not on file   Number of children: 1   Years of education: Not on file   Highest education level: Some college, no degree  Occupational History   Not on file  Tobacco Use   Smoking status: Former   Smokeless tobacco: Never  Vaping Use   Vaping status: Never Used  Substance and Sexual Activity   Alcohol use: Yes    Comment: OCCASIONAL   Drug use: Not Currently   Sexual activity: Yes    Birth control/protection: Post-menopausal  Other Topics Concern   Not on file  Social History Narrative   Not on file   Social Drivers of Health   Financial Resource Strain: Low Risk  (10/28/2023)   Overall Financial Resource Strain (CARDIA)    Difficulty of Paying Living Expenses: Not hard at all  Food Insecurity: Food Insecurity Present (10/28/2023)   Hunger Vital Sign    Worried About Running Out of Food in the Last Year: Sometimes true    Ran Out of Food in the Last Year: Sometimes true  Transportation Needs: No Transportation Needs (10/28/2023)   PRAPARE - Administrator, Civil Service (Medical): No    Lack of Transportation (Non-Medical): No  Physical Activity: Inactive (10/28/2023)   Exercise Vital Sign    Days of Exercise per Week: 0 days    Minutes of Exercise per Session: 0 min  Stress: No Stress Concern Present (10/28/2023)   [employer] of Occupational Health - Occupational Stress Questionnaire    Feeling of Stress : Not at all  Social Connections: Moderately Isolated (10/28/2023)   Social Connection and Isolation Panel    Frequency of Communication with Friends and Family: Three times a week    Frequency of Social Gatherings with Friends and Family: Once a week    Attends Religious Services:  Never    Database administrator or Organizations: No    Attends Banker Meetings: Never    Marital Status: Married  Catering manager Violence: Not At Risk (10/28/2023)   Humiliation, Afraid, Rape, and Kick questionnaire    Fear of Current or Ex-Partner: No    Emotionally Abused: No    Physically Abused: No    Sexually Abused: No    Family History  Problem Relation Age of Onset   Breast cancer Mother    Cancer Mother     Past Surgical History:  Procedure Laterality Date   ANTERIOR CERVICAL DECOMPRESSION/DISCECTOMY FUSION 4 LEVELS N/A 01/04/2020   Procedure: CERVICAL THREE- FOUR, CERVICAL FOUR-FIVE, CERVICAL FIVE- SIX, CERVCAL SIX-  SEVEN ANTERIOR CERVICAL DECOMPRESSION/DISCECTOMY FUSION;  Surgeon: Louis Shove, MD;  Location: [facility] OR;  Service: Neurosurgery;  Laterality: N/A;  CERVICAL THREE- FOUR, CERVICAL FOUR-FIVE, CERVICAL FIVE- SIX, CERVCAL SIX- SEVEN ANTERIOR CERVICAL DECOMPRESSION/DISCECTOMY FUSION   CESAREAN SECTION     ESOPHAGOGASTRODUODENOSCOPY (EGD) WITH PROPOFOL  N/A 12/27/2019   Procedure: ESOPHAGOGASTRODUODENOSCOPY (EGD) WITH PROPOFOL ;  Surgeon: Saintclair Jasper, MD;  Location: [facility] ENDOSCOPY;  Service: Gastroenterology;  Laterality: N/A;  PLEASE ALERT ANESTHESIA TO THE FACT THAT THE PATIENT HAS SUSTAINED A NECK INJURY AND HAS A QUADRIPARESIS   HEMOSTASIS CLIP PLACEMENT  12/27/2019   Procedure: HEMOSTASIS CLIP PLACEMENT;  Surgeon: Saintclair Jasper, MD;  Location: [facility] ENDOSCOPY;  Service: Gastroenterology;;   MULTIPLE EXTRACTIONS WITH ALVEOLOPLASTY N/A 12/30/2019   Procedure: Extraction of tooth #'s 2-8, 11-14, and 20-28 with alveoloplasty and bilateral mandibular lingual tori reductions.;  Surgeon: Cyndee Tanda FALCON, DDS;  Location: [facility] OR;  Service: Oral Surgery;  Laterality: N/A;   SPINE SURGERY      ROS: Review of Systems Negative except as stated above  PHYSICAL EXAM: BP 105/70 (BP Location: Left Arm, Patient Position: Sitting, Cuff Size: Normal)   Pulse 74   Temp  98.1 F (36.7 C) (Oral)   Ht 5' 8 (1.727 m)   Wt 113 lb (51.3 kg)   SpO2 100%   BMI 17.18 kg/m   Wt Readings from Last 3 Encounters:  04/12/24 113 lb (51.3 kg)  03/29/24 118 lb (53.5 kg)  10/13/23 118 lb (53.5 kg)    Physical Exam  General appearance -alert middle-age African-American female in NAD.  She appears underweight for height with temporal wasting. Mental status -she answers questions appropriately. Neck - supple, no significant adenopathy Chest - clear to auscultation, no wheezes, rales or rhonchi, symmetric air entry Heart - normal rate, regular rhythm, normal S1, S2, no murmurs, rubs, clicks or gallops Musculoskeletal -she has a spastic gait.  She ambulates with a cane. Extremities -no lower extremity edema      Latest Ref Rng & Units 10/13/2023    4:09 PM 06/14/2022    2:45 PM 12/30/2020    8:15 PM  CMP  Glucose 70 - 99 mg/dL 86  98  96   BUN 6 - 24 mg/dL 18  17  25    Creatinine 0.57 - 1.00 mg/dL 9.17  9.10  9.20   Sodium 134 - 144 mmol/L 142  141  141   Potassium 3.5 - 5.2 mmol/L 4.0  3.9  4.3   Chloride 96 - 106 mmol/L 102  101  103   CO2 20 - 29 mmol/L 23  19  26    Calcium  8.7 - 10.2 mg/dL 9.9  9.9  89.6   Total Protein 6.0 - 8.5 g/dL 7.2  7.1  8.0   Total Bilirubin 0.0 - 1.2 mg/dL 0.5  0.6  0.8   Alkaline Phos 44 - 121 IU/L 152  130  96   AST 0 - 40 IU/L 26  22  20    ALT 0 - 32 IU/L 14  8  11     Lipid Panel     Component Value Date/Time   CHOL 213 (H) 10/13/2023 1609   TRIG 75 10/13/2023 1609   HDL 65 10/13/2023 1609   CHOLHDL 3.3 10/13/2023 1609   LDLCALC 135 (H) 10/13/2023 1609    CBC    Component Value Date/Time   WBC 4.9 10/13/2023 1609   WBC 5.8 12/30/2020 2015   RBC 4.21 10/13/2023 1609   RBC 4.71 12/30/2020  2015   HGB 12.5 10/13/2023 1609   HCT 38.8 10/13/2023 1609   PLT 255 10/13/2023 1609   MCV 92 10/13/2023 1609   MCH 29.7 10/13/2023 1609   MCH 29.5 12/30/2020 2015   MCHC 32.2 10/13/2023 1609   MCHC 32.9 12/30/2020 2015    RDW 11.5 (L) 10/13/2023 1609   LYMPHSABS 2.0 12/30/2020 2015   MONOABS 0.4 12/30/2020 2015   EOSABS 0.1 12/30/2020 2015   BASOSABS 0.0 12/30/2020 2015    ASSESSMENT AND PLAN: 1. Chronic incomplete quadriplegia (HCC) (Primary) She will continue baclofen   2. Acute left-sided low back pain without sciatica Seems musculoskeletal in nature.  Advised to use the Advil  600 mg 2-3 times a day for the next 3 to 4 days.  Purchase Salonpas or IcyHot rub over-the-counter and use to the area locally.  Follow-up if still no improvement  3. Neurogenic bladder - solifenacin  (VESICARE ) 5 MG tablet; Take 1 tablet (5 mg total) by mouth daily.  Dispense: 30 tablet; Refill: 5  4. Underweight We discussed the medication cyproheptadine which helps stimulate appetite but has some side effects including drowsiness and can cause low blood pressure.  Her blood pressure already tends to run on the low side.  We decided to hold off on trying her on this medicine - Encourage smaller, frequent meals. - Supplement meals with Ensure shakes. - Consider homemade smoothies. - Avoid skipping meals.  5. Vaginal odor Reports odor after seafood consumption, no infection signs. Advised against deodorizers or douching.   6. Mixed hyperlipidemia Encouraged her to continue healthy eating habits and cooking with healthy oils.  We will plan to recheck cholesterol on next visit  7. Allergic conjunctivitis of both eyes Requested refill on Patanol - olopatadine  (PATANOL) 0.1 % ophthalmic solution; Place 1 drop into both eyes 2 (two) times daily as needed for allergies (for allergy symptoms).  Dispense: 5 mL; Refill: 0  8. Herpes zoster vaccination declined Recommended.  Patient declined   Patient was given the opportunity to ask questions.  Patient verbalized understanding of the plan and was able to repeat key elements of the plan.   This documentation was completed using Paediatric nurse.  Any  transcriptional errors are unintentional.  No orders of the defined types were placed in this encounter.    Requested Prescriptions   Signed Prescriptions Disp Refills   olopatadine  (PATANOL) 0.1 % ophthalmic solution 5 mL 0    Sig: Place 1 drop into both eyes 2 (two) times daily as needed for allergies (for allergy symptoms).   solifenacin  (VESICARE ) 5 MG tablet 30 tablet 5    Sig: Take 1 tablet (5 mg total) by mouth daily.    Return in about 5 months (around 09/12/2024).  Barnie Louder, MD, FACP

## 2024-04-12 NOTE — Patient Instructions (Signed)
 VISIT SUMMARY:  Today, we discussed your concerns about weight loss and a fishy vaginal odor after eating seafood. We also addressed your recent back pain and ongoing issues with spasticity and mobility. Additionally, we reviewed your general health maintenance, including diet recommendations for elevated cholesterol.  YOUR PLAN:  -FISHY VAGINAL ODOR: You experience a fishy vaginal odor after eating seafood, which lasts for a couple of days. This is not associated with any infections. It is advised not to use deodorizers or douching as these can cause irritation.  -ACUTE BACK PAIN: You have left-sided back pain after sleeping on a hard bed. To help manage the pain, increase your Advil  dosage to 600 mg two to three times daily with food for 3-4 days. You can also use Salonpas or icy hot rub for additional relief.  -UNDERWEIGHT: You have lost weight over the past six months and are currently underweight for your height. To help with this, try to eat smaller, frequent meals and supplement your diet with Ensure shakes or homemade smoothies. Avoid skipping meals.  -CHRONIC SPASTICITY AND INCOMPLETE QUADRIPARESIS: Your spasticity and mobility issues are managed with baclofen  and the use of a cane. Continue taking baclofen  and using your cane for support.  -GENERAL HEALTH MAINTENANCE: You declined the shingles vaccine. We discussed dietary recommendations to help manage your elevated cholesterol levels. We will recheck your cholesterol levels at your next visit.  INSTRUCTIONS:  Please follow up at your next visit to recheck your cholesterol levels. Continue with the advised dietary changes and medication adjustments. If your symptoms persist or worsen, contact the office for further evaluation.

## 2024-04-13 ENCOUNTER — Ambulatory Visit (INDEPENDENT_AMBULATORY_CARE_PROVIDER_SITE_OTHER): Payer: Medicare (Managed Care) | Admitting: Otolaryngology

## 2024-05-14 ENCOUNTER — Encounter: Payer: Self-pay | Admitting: Internal Medicine

## 2024-05-14 ENCOUNTER — Telehealth: Payer: Self-pay | Admitting: Internal Medicine

## 2024-05-14 NOTE — Telephone Encounter (Signed)
 Patient sent MyChart message and a video visit was provided to address concern for 05/31/24. Patient notified via MyChart.

## 2024-05-14 NOTE — Telephone Encounter (Signed)
 Copied from CRM #[id number]. Topic: Appointments - Appointment Scheduling >> May 14, 2024 11:46 AM Anna Campos wrote:  Fungus underneath both toe nails - 2 big toes.Anna Campos does she need to be seen?  Pls call.. said would like some medication?

## 2024-05-14 NOTE — Telephone Encounter (Signed)
 Please let me know if she needs an appointment, or if Rx can be prescribed. Thank you.

## 2024-05-31 ENCOUNTER — Ambulatory Visit: Payer: Medicare (Managed Care) | Admitting: Internal Medicine

## 2024-05-31 DIAGNOSIS — Z538 Procedure and treatment not carried out for other reasons: Secondary | ICD-10-CM

## 2024-05-31 NOTE — Progress Notes (Signed)
 Unable to successfully connect via video to do visit. We agree to schedule in-office visit.

## 2024-06-01 ENCOUNTER — Encounter: Payer: Self-pay | Admitting: Internal Medicine

## 2024-06-01 ENCOUNTER — Ambulatory Visit: Payer: Medicare (Managed Care) | Attending: Internal Medicine | Admitting: Internal Medicine

## 2024-06-01 VITALS — BP 98/65 | HR 75 | Temp 97.9°F | Ht 68.0 in | Wt 113.0 lb

## 2024-06-01 DIAGNOSIS — B351 Tinea unguium: Secondary | ICD-10-CM | POA: Diagnosis not present

## 2024-06-01 DIAGNOSIS — R748 Abnormal levels of other serum enzymes: Secondary | ICD-10-CM | POA: Diagnosis not present

## 2024-06-01 DIAGNOSIS — H1013 Acute atopic conjunctivitis, bilateral: Secondary | ICD-10-CM | POA: Diagnosis not present

## 2024-06-01 MED ORDER — TERBINAFINE HCL 250 MG PO TABS: 250.0000 mg | ORAL_TABLET | Freq: Every day | ORAL | 1 refills | Status: DC

## 2024-06-01 MED ORDER — OLOPATADINE HCL 0.1 % OP SOLN: 1.0000 [drp] | Freq: Two times a day (BID) | OPHTHALMIC | 0 refills | Status: AC | PRN

## 2024-06-01 NOTE — Patient Instructions (Signed)
 VISIT SUMMARY:  Today, you were seen for concerns about a fungal infection on your toenails and fingernails, as well as itchy, red eyes. We discussed your symptoms, reviewed your current treatments, and made a plan to address these issues.  YOUR PLAN:  -ONYCHOMYCOSIS OF BILATERAL GREAT TOES AND FINGERNAILS: Onychomycosis is a fungal infection of the nails, causing discoloration and changes in nail growth. We will start by ordering a liver function test to ensure it is safe for you to take Lamisil  (terbinafine ), an antifungal medication. Please monitor for any signs of liver problems, such as yellowing of the skin or eyes, and stop the medication if these occur. Wait for the liver function test results before picking up the prescription from the pharmacy.  -ALLERGIC CONJUNCTIVITIS: Allergic conjunctivitis is an eye inflammation caused by allergies, leading to itchy and red eyes. We have sent a prescription for Pataday  to the pharmacy to check for insurance coverage.  INSTRUCTIONS:  Please complete the liver function test as soon as possible. Once the results are available, you can pick up your prescription for Lamisil  if it is safe to do so. Additionally, monitor for any signs of liver dysfunction while on the medication. For your allergic conjunctivitis, check with the pharmacy regarding the coverage of Pataday .

## 2024-06-01 NOTE — Progress Notes (Addendum)
 Patient ID: Anna Campos, female    DOB: 1966/07/14  MRN: [id number]  CC: Follow-up (Follow-up. /Pharmacy did not have Pantanol - bought Pataday  but requesting alternative due to cost/Nail fungus on R & L feet, and both hands - OTC not resolving )   Subjective: Anna Campos is a 58 y.o. female who presents for UC visit Her concerns today include:  Patient with history of incomplete quadriplegia at C5-6 level, neurogenic bowel, neurogenic bladder, nerve pain, anemia, hypotension (was on Florinef ), gastric ulcers, PE in the setting of acute illness, HL, genital herpes.    Discussed the use of AI scribe software for clinical note transcription with the patient, who gave verbal consent to proceed.  History of Present Illness ASHTAN Campos is a 58 year old female with a history of liver issues who presents with concerns of fungal infection on her toenails and fingernails.  She has discoloration of her nails, which began over a month ago on her right big toe and subsequently spread to her left big toe, left ring finger, and right forefinger. The discoloration has worsened over time, becoming darker.  She has used an over-the-counter liquid antifungal product from [employer], applying it to her nails, but the discoloration recurs.  Her toenails grow faster than her fingernails, and she clips her fingernails short because they do not grow well.  She also complains of itchy red eyes intermittently related to allergies.  We had prescribed Pataday  for her which she states cost $17 but works well.  She indicated that she purchased it over-the-counter.  I am not sure that it is sold over-the-counter.  However I told her that I will send the prescription to her pharmacy and she can check to see whether it is covered by her insurance.    Patient Active Problem List   Diagnosis Date Noted   Impacted cerumen of both ears 03/30/2024   Fall on same level 08/22/2022   Cervical spinal cord injury (HCC)  08/22/2022   Right foot drop 12/17/2021   Decreased range of motion of finger of right hand 09/12/2021   Chronic low back pain 12/01/2020   Mixed hyperlipidemia 10/11/2020   Gastric ulcer 10/11/2020   Spasticity 02/16/2020   Nerve pain 02/16/2020   Abnormality of gait 02/16/2020   Pressure injury of skin 01/15/2020   Transaminitis    Hyponatremia    Anemia    Neurogenic bowel 01/11/2020   Neurogenic bladder 01/11/2020   Malnutrition of moderate degree 01/01/2020   Poor dentition    Spinal stenosis of cervical region    Chronic incomplete quadriplegia (HCC)    Hypotension    Bacteremia    Syncope 12/19/2019     Current Outpatient Medications on File Prior to Visit  Medication Sig Dispense Refill   cyclobenzaprine  (FLEXERIL ) 10 MG tablet Take 1 tablet (10 mg total) by mouth 2 (two) times daily as needed for muscle spasms. 60 tablet 2   ibuprofen  (ADVIL ) 600 MG tablet Take 1 tablet (600 mg total) by mouth every 6 (six) hours as needed. 30 tablet 0   Multiple Vitamin (MULTIVITAMIN WITH MINERALS) TABS tablet Take 1 tablet by mouth daily.     olopatadine  (PATANOL) 0.1 % ophthalmic solution Place 1 drop into both eyes 2 (two) times daily as needed for allergies (for allergy symptoms). 5 mL 0   solifenacin  (VESICARE ) 5 MG tablet Take 1 tablet (5 mg total) by mouth daily. 30 tablet 5   trimethoprim -polymyxin b  (POLYTRIM ) ophthalmic solution  Place 1 drop into both eyes every 4 (four) hours. 10 mL 0   acetaminophen  (TYLENOL ) 325 MG tablet Take 2 tablets (650 mg total) by mouth every 4 (four) hours as needed for mild pain (temp > 101.5). (Patient not taking: Reported on 06/01/2024)     baclofen  (LIORESAL ) 10 MG tablet Take 1 tablet (10 mg total) by mouth 3 (three) times daily. For spasticity- no ringing in her ears on lower dose (Patient not taking: Reported on 06/01/2024) 90 each 5   Current Facility-Administered Medications on File Prior to Visit  Medication Dose Route Frequency Provider Last  Rate Last Admin   sodium chloride  (PF) 0.9 % injection 5 mL  5 mL Intravenous PRN    5 mL at 08/20/23 1517    Allergies  Allergen Reactions   Ampicillin Diarrhea   Baclofen  Other (See Comments)    Ringing in the ears   Chlorhexidine      Social History   Socioeconomic History   Marital status: Married    Spouse name: Not on file   Number of children: 1   Years of education: Not on file   Highest education level: Some college, no degree  Occupational History   Not on file  Tobacco Use   Smoking status: Former   Smokeless tobacco: Never  Vaping Use   Vaping status: Never Used  Substance and Sexual Activity   Alcohol use: Yes    Comment: OCCASIONAL   Drug use: Not Currently   Sexual activity: Yes    Birth control/protection: Post-menopausal  Other Topics Concern   Not on file  Social History Narrative   Not on file   Social Drivers of Health   Financial Resource Strain: Low Risk  (10/28/2023)   Overall Financial Resource Strain (CARDIA)    Difficulty of Paying Living Expenses: Not hard at all  Food Insecurity: Food Insecurity Present (10/28/2023)   Hunger Vital Sign    Worried About Running Out of Food in the Last Year: Sometimes true    Ran Out of Food in the Last Year: Sometimes true  Transportation Needs: No Transportation Needs (10/28/2023)   PRAPARE - Administrator, Civil Service (Medical): No    Lack of Transportation (Non-Medical): No  Physical Activity: Inactive (10/28/2023)   Exercise Vital Sign    Days of Exercise per Week: 0 days    Minutes of Exercise per Session: 0 min  Stress: No Stress Concern Present (10/28/2023)   [employer] of Occupational Health - Occupational Stress Questionnaire    Feeling of Stress : Not at all  Social Connections: Moderately Isolated (10/28/2023)   Social Connection and Isolation Panel    Frequency of Communication with Friends and Family: Three times a week    Frequency of Social Gatherings with Friends and  Family: Once a week    Attends Religious Services: Never    Database administrator or Organizations: No    Attends Banker Meetings: Never    Marital Status: Married  Catering manager Violence: Not At Risk (10/28/2023)   Humiliation, Afraid, Rape, and Kick questionnaire    Fear of Current or Ex-Partner: No    Emotionally Abused: No    Physically Abused: No    Sexually Abused: No    Family History  Problem Relation Age of Onset   Breast cancer Mother    Cancer Mother     Past Surgical History:  Procedure Laterality Date   ANTERIOR CERVICAL DECOMPRESSION/DISCECTOMY FUSION 4 LEVELS  N/A 01/04/2020   Procedure: CERVICAL THREE- FOUR, CERVICAL FOUR-FIVE, CERVICAL FIVE- SIX, CERVCAL SIX- SEVEN ANTERIOR CERVICAL DECOMPRESSION/DISCECTOMY FUSION;  Surgeon: Louis Shove, MD;  Location: [facility] OR;  Service: Neurosurgery;  Laterality: N/A;  CERVICAL THREE- FOUR, CERVICAL FOUR-FIVE, CERVICAL FIVE- SIX, CERVCAL SIX- SEVEN ANTERIOR CERVICAL DECOMPRESSION/DISCECTOMY FUSION   CESAREAN SECTION     ESOPHAGOGASTRODUODENOSCOPY (EGD) WITH PROPOFOL  N/A 12/27/2019   Procedure: ESOPHAGOGASTRODUODENOSCOPY (EGD) WITH PROPOFOL ;  Surgeon: Saintclair Jasper, MD;  Location: [facility] ENDOSCOPY;  Service: Gastroenterology;  Laterality: N/A;  PLEASE ALERT ANESTHESIA TO THE FACT THAT THE PATIENT HAS SUSTAINED A NECK INJURY AND HAS A QUADRIPARESIS   HEMOSTASIS CLIP PLACEMENT  12/27/2019   Procedure: HEMOSTASIS CLIP PLACEMENT;  Surgeon: Saintclair Jasper, MD;  Location: [facility] ENDOSCOPY;  Service: Gastroenterology;;   MULTIPLE EXTRACTIONS WITH ALVEOLOPLASTY N/A 12/30/2019   Procedure: Extraction of tooth #'s 2-8, 11-14, and 20-28 with alveoloplasty and bilateral mandibular lingual tori reductions.;  Surgeon: Cyndee Tanda FALCON, DDS;  Location: [facility] OR;  Service: Oral Surgery;  Laterality: N/A;   SPINE SURGERY      ROS: Review of Systems Negative except as stated above  PHYSICAL EXAM: BP 98/65 (BP Location: Left Arm, Patient Position:  Sitting, Cuff Size: Normal)   Pulse 75   Temp 97.9 F (36.6 C) (Oral)   Ht 5' 8 (1.727 m)   Wt 113 lb (51.3 kg)   SpO2 100%   BMI 17.18 kg/m   Physical Exam  General appearance -older African-American female in NAD Mental status - normal mood, behavior, speech, dress, motor activity, and thought processes Skin -toenails on both big toes are discolored and thick from about the midpoint to the end of the toenails.  She has no significant discoloration noted of the fingernails      Latest Ref Rng & Units 10/13/2023    4:09 PM 06/14/2022    2:45 PM 12/30/2020    8:15 PM  CMP  Glucose 70 - 99 mg/dL 86  98  96   BUN 6 - 24 mg/dL 18  17  25    Creatinine 0.57 - 1.00 mg/dL 9.17  9.10  9.20   Sodium 134 - 144 mmol/L 142  141  141   Potassium 3.5 - 5.2 mmol/L 4.0  3.9  4.3   Chloride 96 - 106 mmol/L 102  101  103   CO2 20 - 29 mmol/L 23  19  26    Calcium  8.7 - 10.2 mg/dL 9.9  9.9  89.6   Total Protein 6.0 - 8.5 g/dL 7.2  7.1  8.0   Total Bilirubin 0.0 - 1.2 mg/dL 0.5  0.6  0.8   Alkaline Phos 44 - 121 IU/L 152  130  96   AST 0 - 40 IU/L 26  22  20    ALT 0 - 32 IU/L 14  8  11     Lipid Panel     Component Value Date/Time   CHOL 213 (H) 10/13/2023 1609   TRIG 75 10/13/2023 1609   HDL 65 10/13/2023 1609   CHOLHDL 3.3 10/13/2023 1609   LDLCALC 135 (H) 10/13/2023 1609    CBC    Component Value Date/Time   WBC 4.9 10/13/2023 1609   WBC 5.8 12/30/2020 2015   RBC 4.21 10/13/2023 1609   RBC 4.71 12/30/2020 2015   HGB 12.5 10/13/2023 1609   HCT 38.8 10/13/2023 1609   PLT 255 10/13/2023 1609   MCV 92 10/13/2023 1609   MCH 29.7 10/13/2023 1609   MCH 29.5 12/30/2020  2015   MCHC 32.2 10/13/2023 1609   MCHC 32.9 12/30/2020 2015   RDW 11.5 (L) 10/13/2023 1609   LYMPHSABS 2.0 12/30/2020 2015   MONOABS 0.4 12/30/2020 2015   EOSABS 0.1 12/30/2020 2015   BASOSABS 0.0 12/30/2020 2015    Assessment and Plan Assessment & Plan Onychomycosis of bilateral great toes Previous OTC  treatment ineffective. Discussed risks and benefits of use of Lamisil  versus topical Penlac.  Patient prefers to go with oral Lamisil .  Advised that we would need to check baseline liver function test and then once a month for the next 2 months.  Hold off on filling the prescription until I get the results of the liver function test tomorrow.  Advised to stop the medication if she develops any jaundice, vomiting or pain in the right upper quadrant of the abdomen which can be indicative of drug-induced hepatitis  Allergic conjunctivitis Allergic conjunctivitis with itchy, red eyes. - Send prescription for Pataday  to pharmacy to check for insurance coverage.  Addendum 06/02/2024: Patient with mild persistent elevation of alkaline phosphatase.  Will add GGT.  Patient sent MyChart message that it is okay to start the Lamisil  but return to the lab in 1 month for LFT and antimitochondrial antibody check.  Patient was given the opportunity to ask questions.  Patient verbalized understanding of the plan and was able to repeat key elements of the plan.   This documentation was completed using Paediatric nurse.  Any transcriptional errors are unintentional.  No orders of the defined types were placed in this encounter.    Requested Prescriptions    No prescriptions requested or ordered in this encounter    No follow-ups on file.  Barnie Louder, MD, FACP

## 2024-06-02 ENCOUNTER — Other Ambulatory Visit: Payer: Self-pay | Admitting: Internal Medicine

## 2024-06-02 ENCOUNTER — Ambulatory Visit: Payer: Self-pay | Admitting: Internal Medicine

## 2024-06-02 DIAGNOSIS — R748 Abnormal levels of other serum enzymes: Secondary | ICD-10-CM

## 2024-06-02 LAB — HEPATIC FUNCTION PANEL
ALT: 14 IU/L (ref 0–32)
AST: 21 IU/L (ref 0–40)
Albumin: 4.6 g/dL (ref 3.8–4.9)
Alkaline Phosphatase: 152 IU/L — ABNORMAL HIGH (ref 44–121)
Bilirubin Total: 0.5 mg/dL (ref 0.0–1.2)
Bilirubin, Direct: 0.15 mg/dL (ref 0.00–0.40)
Total Protein: 7.2 g/dL (ref 6.0–8.5)

## 2024-06-02 NOTE — Addendum Note (Signed)
 Addended by: VICCI SOBER B on: 06/02/2024 09:13 AM   Modules accepted: Orders

## 2024-06-08 LAB — SPECIMEN STATUS REPORT

## 2024-06-08 LAB — GAMMA GT: GGT: 24 IU/L (ref 0–60)

## 2024-07-22 ENCOUNTER — Telehealth: Payer: Self-pay | Admitting: Internal Medicine

## 2024-07-22 NOTE — Telephone Encounter (Unsigned)
 Copied from CRM [id number]. Topic: Clinical - Medication Refill >> Jul 22, 2024 12:07 PM Emylou G wrote: Medication: ibuprofen  (ADVIL ) 600 MG tablet cyclobenzaprine  (FLEXERIL ) 10 MG tablet  Has the patient contacted their pharmacy? No (Agent: If no, request that the patient contact the pharmacy for the refill. If patient does not wish to contact the pharmacy document the reason why and proceed with request.) (Agent: If yes, when and what did the pharmacy advise?)  This is the patient's preferred pharmacy:  [id number] - [place] ([place] - [address] [place] ([place]) [place] [address] Phone: [phone] Fax: [phone]  Is this the correct pharmacy for this prescription? Yes If no, delete pharmacy and type the correct one.   Has the prescription been filled recently? No  Is the patient out of the medication? Yes  Has the patient been seen for an appointment in the last year OR does the patient have an upcoming appointment? Yes  Can we respond through MyChart? Yes  Agent: Please be advised that Rx refills may take up to 3 business days. We ask that you follow-up with your pharmacy.

## 2024-07-23 ENCOUNTER — Ambulatory Visit: Payer: Self-pay

## 2024-07-23 ENCOUNTER — Other Ambulatory Visit: Payer: Self-pay | Admitting: Family Medicine

## 2024-07-23 DIAGNOSIS — G8250 Quadriplegia, unspecified: Secondary | ICD-10-CM

## 2024-07-23 MED ORDER — CYCLOBENZAPRINE HCL 10 MG PO TABS: 10.0000 mg | ORAL_TABLET | Freq: Two times a day (BID) | ORAL | 1 refills | Status: AC | PRN

## 2024-07-23 MED ORDER — CYCLOBENZAPRINE HCL 10 MG PO TABS: 10.0000 mg | ORAL_TABLET | Freq: Two times a day (BID) | ORAL | 1 refills | Status: DC | PRN

## 2024-07-23 NOTE — Addendum Note (Signed)
 Addended by: Amaree Leeper on: 07/23/2024 07:02 PM   Modules accepted: Orders

## 2024-07-23 NOTE — Telephone Encounter (Signed)
 I refilled her Flexeril .

## 2024-07-23 NOTE — Telephone Encounter (Signed)
 FYI Only or Action Required?: Action required by provider: medication refill request.  Patient was last seen in primary care on 06/01/2024 by Vicci Barnie NOVAK, MD.  Called Nurse Triage reporting Back Pain.  Symptoms began chronic.  Interventions attempted: Prescription medications: as prescribed, requesting refills and Rest, hydration, or home remedies.  Symptoms are: unchanged.  Triage Disposition: See PCP When Office is Open (Within 3 Days)  Patient/caregiver understands and will follow disposition?: No   Copied from CRM #[id number]. Topic: Clinical - Red Word Triage >> Jul 23, 2024 10:39 AM Joesph NOVAK wrote: Red Word that prompted transfer to Nurse Triage: Excrutiating back pain. Reason for Disposition  [1] MODERATE back pain (e.g., interferes with normal activities) AND [2] present > 3 days  Answer Assessment - Initial Assessment Questions 1. ONSET: When did the pain begin? (e.g., minutes, hours, days)     Chronic  2. LOCATION: Where does it hurt? (upper, mid or lower back)     Lower  3. SEVERITY: How bad is the pain?  (e.g., Scale 1-10; mild, moderate, or severe)      4. PATTERN: Is the pain constant? (e.g., yes, no; constant, intermittent)      Constant  5. RADIATION: Does the pain shoot into your legs or somewhere else?     Denies  6. CAUSE:  What do you think is causing the back pain?      Broken vertebra 7. BACK OVERUSE:  Any recent lifting of heavy objects, strenuous work or exercise?     no 8. MEDICINES: What have you taken so far for the pain? (e.g., nothing, acetaminophen , NSAIDS)     Nothing  9. NEUROLOGIC SYMPTOMS: Do you have any weakness, numbness, or problems with bowel/bladder control?     Denies  10. OTHER SYMPTOMS: Do you have any other symptoms? (e.g., fever, abdomen pain, burning with urination, blood in urine)       Denies  Protocols used: Back Pain-A-AH

## 2024-07-23 NOTE — Telephone Encounter (Signed)
Pt requesting medication for back pain.

## 2024-07-26 NOTE — Telephone Encounter (Signed)
 Patient has been informed.

## 2024-08-09 ENCOUNTER — Telehealth: Payer: Self-pay | Admitting: Internal Medicine

## 2024-08-09 NOTE — Telephone Encounter (Signed)
 Copied from CRM #[id number]. Topic: Clinical - Lab/Test Results >> Aug 09, 2024  1:25 PM Turkey B wrote: Reason for CRM: patient needs order to have liver checked

## 2024-08-10 NOTE — Telephone Encounter (Signed)
 Future lab orders are already in the system.  She can come to the lab.

## 2024-08-11 NOTE — Telephone Encounter (Signed)
 Called & spoke to the patient. Verified name & DOB. Scheduled lab appointment for 08/12/2024. Patient confirmed appointment.

## 2024-08-12 ENCOUNTER — Other Ambulatory Visit: Payer: Self-pay

## 2024-08-12 ENCOUNTER — Ambulatory Visit: Payer: Medicare (Managed Care) | Attending: Internal Medicine

## 2024-08-12 DIAGNOSIS — R748 Abnormal levels of other serum enzymes: Secondary | ICD-10-CM

## 2024-08-14 ENCOUNTER — Ambulatory Visit: Payer: Self-pay | Admitting: Internal Medicine

## 2024-08-16 LAB — HEPATIC FUNCTION PANEL
ALT: 15 IU/L (ref 0–32)
AST: 28 IU/L (ref 0–40)
Albumin: 4.6 g/dL (ref 3.8–4.9)
Alkaline Phosphatase: 142 IU/L — ABNORMAL HIGH (ref 49–135)
Bilirubin Total: 0.4 mg/dL (ref 0.0–1.2)
Bilirubin, Direct: 0.13 mg/dL (ref 0.00–0.40)
Total Protein: 7.1 g/dL (ref 6.0–8.5)

## 2024-08-16 LAB — MITOCHONDRIAL ANTIBODIES: Mitochondrial Ab: <20 U (ref 0.0–20.0)

## 2024-09-13 ENCOUNTER — Ambulatory Visit: Payer: Medicare (Managed Care) | Attending: Internal Medicine | Admitting: Internal Medicine

## 2024-09-13 ENCOUNTER — Encounter: Payer: Self-pay | Admitting: Internal Medicine

## 2024-09-13 VITALS — BP 111/76 | HR 89 | Ht 68.0 in | Wt 114.0 lb

## 2024-09-13 DIAGNOSIS — G8250 Quadriplegia, unspecified: Secondary | ICD-10-CM | POA: Diagnosis not present

## 2024-09-13 DIAGNOSIS — A6000 Herpesviral infection of urogenital system, unspecified: Secondary | ICD-10-CM | POA: Insufficient documentation

## 2024-09-13 DIAGNOSIS — Z86711 Personal history of pulmonary embolism: Secondary | ICD-10-CM | POA: Insufficient documentation

## 2024-09-13 DIAGNOSIS — B351 Tinea unguium: Secondary | ICD-10-CM | POA: Diagnosis not present

## 2024-09-13 DIAGNOSIS — N319 Neuromuscular dysfunction of bladder, unspecified: Secondary | ICD-10-CM | POA: Diagnosis not present

## 2024-09-13 DIAGNOSIS — G8254 Quadriplegia, C5-C7 incomplete: Secondary | ICD-10-CM | POA: Insufficient documentation

## 2024-09-13 DIAGNOSIS — K592 Neurogenic bowel, not elsewhere classified: Secondary | ICD-10-CM | POA: Diagnosis not present

## 2024-09-13 DIAGNOSIS — R636 Underweight: Secondary | ICD-10-CM | POA: Diagnosis not present

## 2024-09-13 DIAGNOSIS — D649 Anemia, unspecified: Secondary | ICD-10-CM | POA: Insufficient documentation

## 2024-09-13 DIAGNOSIS — Z681 Body mass index (BMI) 19 or less, adult: Secondary | ICD-10-CM | POA: Diagnosis not present

## 2024-09-13 DIAGNOSIS — Z23 Encounter for immunization: Secondary | ICD-10-CM

## 2024-09-13 DIAGNOSIS — I959 Hypotension, unspecified: Secondary | ICD-10-CM | POA: Diagnosis not present

## 2024-09-13 DIAGNOSIS — E782 Mixed hyperlipidemia: Secondary | ICD-10-CM | POA: Insufficient documentation

## 2024-09-13 MED ORDER — TERBINAFINE HCL 250 MG PO TABS: 250.0000 mg | ORAL_TABLET | Freq: Every day | ORAL | 0 refills | Status: AC

## 2024-09-13 NOTE — Patient Instructions (Signed)
  VISIT SUMMARY: Today, you came in for a follow-up and medication review. We discussed your ongoing treatment for toenail fungus, muscle weakness, neurogenic bladder, and nutrition. You also received your flu shot today.  YOUR PLAN: -TOENAIL FUNGUS: Toenail fungus is an infection that causes discoloration and thickening of the nails. Your toenail is improving with Lamisil  treatment. Continue taking Lamisil  for one more month. A liver function test has been ordered after you complete the Lamisil  course, and a refill has been sent to your pharmacy.  -MUSCLE WEAKNESS AND SPASMS: Muscle weakness and spasms can cause discomfort and difficulty with movement. You have discontinued baclofen  and are managing your symptoms with Flexeril  as needed. Continue to pace yourself during activities and use your cane on uneven surfaces.  -NEUROGENIC BLADDER WITH URINARY INCONTINENCE: Neurogenic bladder is a condition where the nerves that control the bladder do not work properly, leading to urinary incontinence. You are managing your symptoms by limiting fluid intake in the evenings and using pads for occasional leakage. VESIcare  has been removed from your medication list.  -MIXED HYPERLIPIDEMIA: Mixed hyperlipidemia is a condition where there are high levels of different types of fats in the blood. A cholesterol test has been ordered today to monitor your levels.  -UNDERWEIGHT: Being underweight means having a body weight that is lower than what is considered healthy. Your weight is stable at 114 lbs, and you are maintaining your nutrition with three meals a day and two Ensure drinks. Continue with your current dietary regimen.  -GENERAL HEALTH MAINTENANCE: You received your flu shot today to help protect against the flu. Routine labs have been ordered to monitor your health.  INSTRUCTIONS: Please follow up with the liver function test after completing your Lamisil  course. Additionally, make sure to get your  cholesterol test done as ordered. Continue with your current dietary regimen and lifestyle modifications for managing your symptoms. If you have any new symptoms or concerns, please schedule an appointment.                      Contains text generated by Abridge.                                 Contains text generated by Abridge.

## 2024-09-13 NOTE — Progress Notes (Signed)
 Patient ID: Anna Campos, female    DOB: 1965/11/02  MRN: [id number]  CC: Chronic Disease Mangement (Chronic Disease Management. /No questions / concerns/Yes to flu vax)   Subjective: Anna Campos is a 58 y.o. female who presents for chronic ds management. Her concerns today include:  Patient with history of incomplete quadriplegia at C5-6 level, neurogenic bowel, neurogenic bladder, nerve pain, anemia, hypotension (was on Florinef ), gastric ulcers, PE in the setting of acute illness, HL, genital herpes.    Discussed the use of AI scribe software for clinical note transcription with the patient, who gave verbal consent to proceed.  History of Present Illness Anna Campos is a 58 year old female who presents for follow-up and medication review.  She has been experiencing toenail fungus, which has shown improvement after two months of treatment with Lamisil . The toenail, previously described as 'real yellow,' is now lightening. She is on her second bottle of Lamisil  and expects to complete the treatment by December. She has tolerated the medication well.  She has a history of incomplete quadriplegia with muscle spasms.  She discontinued baclofen  due to ineffectiveness. She experiences muscle tightness and spasms but finds relief with Flexeril , which she takes as needed. No recent falls have occurred, and she uses a cane occasionally, particularly on uneven surfaces. She paces herself during activities like cleaning to manage her symptoms.  She has a history of neurogenic bladder but is not currently using VESIcare . She manages her symptoms by limiting fluid intake in the evenings and uses pads for occasional leakage. She does not take any medication for this condition at present.  In terms of nutrition, she maintains her weight with three meals a day supplemented by two Ensure drinks daily. She reports feeling her hips getting wider, indicating some weight gain.  However weight has remained  stable since last visit and she remains underweight with a BMI of 17.  Despite her being underweight she has hyperlipidemia.  Reports she is doing well with her eating habits avoiding fried foods and cooking with olive oil    Patient Active Problem List   Diagnosis Date Noted   Impacted cerumen of both ears 03/30/2024   Fall on same level 08/22/2022   Cervical spinal cord injury (HCC) 08/22/2022   Right foot drop 12/17/2021   Decreased range of motion of finger of right hand 09/12/2021   Chronic low back pain 12/01/2020   Mixed hyperlipidemia 10/11/2020   Gastric ulcer 10/11/2020   Spasticity 02/16/2020   Nerve pain 02/16/2020   Abnormality of gait 02/16/2020   Pressure injury of skin 01/15/2020   Transaminitis    Hyponatremia    Anemia    Neurogenic bowel 01/11/2020   Neurogenic bladder 01/11/2020   Malnutrition of moderate degree 01/01/2020   Poor dentition    Spinal stenosis of cervical region    Chronic incomplete quadriplegia (HCC)    Hypotension    Bacteremia    Syncope 12/19/2019     Current Outpatient Medications on File Prior to Visit  Medication Sig Dispense Refill   cyclobenzaprine  (FLEXERIL ) 10 MG tablet Take 1 tablet (10 mg total) by mouth 2 (two) times daily as needed for muscle spasms. 60 tablet 1   ibuprofen  (ADVIL ) 600 MG tablet Take 1 tablet (600 mg total) by mouth every 6 (six) hours as needed. 30 tablet 0   Multiple Vitamin (MULTIVITAMIN WITH MINERALS) TABS tablet Take 1 tablet by mouth daily.     olopatadine  (PATANOL) 0.1 %  ophthalmic solution Place 1 drop into both eyes 2 (two) times daily as needed for allergies (for allergy symptoms). 5 mL 0   solifenacin  (VESICARE ) 5 MG tablet Take 1 tablet (5 mg total) by mouth daily. 30 tablet 5   terbinafine  (LAMISIL ) 250 MG tablet Take 1 tablet (250 mg total) by mouth daily. 30 tablet 1   trimethoprim -polymyxin b  (POLYTRIM ) ophthalmic solution Place 1 drop into both eyes every 4 (four) hours. 10 mL 0    acetaminophen  (TYLENOL ) 325 MG tablet Take 2 tablets (650 mg total) by mouth every 4 (four) hours as needed for mild pain (temp > 101.5). (Patient not taking: Reported on 09/13/2024)     baclofen  (LIORESAL ) 10 MG tablet Take 1 tablet (10 mg total) by mouth 3 (three) times daily. For spasticity- no ringing in her ears on lower dose (Patient not taking: Reported on 09/13/2024) 90 each 5   Current Facility-Administered Medications on File Prior to Visit  Medication Dose Route Frequency Provider Last Rate Last Admin   sodium chloride  (PF) 0.9 % injection 5 mL  5 mL Intravenous PRN    5 mL at 08/20/23 1517    Allergies  Allergen Reactions   Ampicillin Diarrhea   Baclofen  Other (See Comments)    Ringing in the ears   Chlorhexidine      Social History   Socioeconomic History   Marital status: Married    Spouse name: Not on file   Number of children: 1   Years of education: Not on file   Highest education level: Some college, no degree  Occupational History   Not on file  Tobacco Use   Smoking status: Former   Smokeless tobacco: Never  Vaping Use   Vaping status: Never Used  Substance and Sexual Activity   Alcohol use: Yes    Comment: OCCASIONAL   Drug use: Not Currently   Sexual activity: Yes    Birth control/protection: Post-menopausal  Other Topics Concern   Not on file  Social History Narrative   Not on file   Social Drivers of Health   Financial Resource Strain: Low Risk  (10/28/2023)   Overall Financial Resource Strain (CARDIA)    Difficulty of Paying Living Expenses: Not hard at all  Food Insecurity: Food Insecurity Present (10/28/2023)   Hunger Vital Sign    Worried About Running Out of Food in the Last Year: Sometimes true    Ran Out of Food in the Last Year: Sometimes true  Transportation Needs: No Transportation Needs (10/28/2023)   PRAPARE - Administrator, Civil Service (Medical): No    Lack of Transportation (Non-Medical): No  Physical Activity:  Inactive (10/28/2023)   Exercise Vital Sign    Days of Exercise per Week: 0 days    Minutes of Exercise per Session: 0 min  Stress: No Stress Concern Present (10/28/2023)   [employer] of Occupational Health - Occupational Stress Questionnaire    Feeling of Stress : Not at all  Social Connections: Moderately Isolated (10/28/2023)   Social Connection and Isolation Panel    Frequency of Communication with Friends and Family: Three times a week    Frequency of Social Gatherings with Friends and Family: Once a week    Attends Religious Services: Never    Database Administrator or Organizations: No    Attends Banker Meetings: Never    Marital Status: Married  Catering Manager Violence: Not At Risk (10/28/2023)   Humiliation, Afraid, Rape, and Kick questionnaire  Fear of Current or Ex-Partner: No    Emotionally Abused: No    Physically Abused: No    Sexually Abused: No    Family History  Problem Relation Age of Onset   Breast cancer Mother    Cancer Mother     Past Surgical History:  Procedure Laterality Date   ANTERIOR CERVICAL DECOMPRESSION/DISCECTOMY FUSION 4 LEVELS N/A 01/04/2020   Procedure: CERVICAL THREE- FOUR, CERVICAL FOUR-FIVE, CERVICAL FIVE- SIX, CERVCAL SIX- SEVEN ANTERIOR CERVICAL DECOMPRESSION/DISCECTOMY FUSION;  Surgeon: Louis Shove, MD;  Location: [facility] OR;  Service: Neurosurgery;  Laterality: N/A;  CERVICAL THREE- FOUR, CERVICAL FOUR-FIVE, CERVICAL FIVE- SIX, CERVCAL SIX- SEVEN ANTERIOR CERVICAL DECOMPRESSION/DISCECTOMY FUSION   CESAREAN SECTION     ESOPHAGOGASTRODUODENOSCOPY (EGD) WITH PROPOFOL  N/A 12/27/2019   Procedure: ESOPHAGOGASTRODUODENOSCOPY (EGD) WITH PROPOFOL ;  Surgeon: Saintclair Jasper, MD;  Location: [facility] ENDOSCOPY;  Service: Gastroenterology;  Laterality: N/A;  PLEASE ALERT ANESTHESIA TO THE FACT THAT THE PATIENT HAS SUSTAINED A NECK INJURY AND HAS A QUADRIPARESIS   HEMOSTASIS CLIP PLACEMENT  12/27/2019   Procedure: HEMOSTASIS CLIP PLACEMENT;  Surgeon:  Saintclair Jasper, MD;  Location: [facility] ENDOSCOPY;  Service: Gastroenterology;;   MULTIPLE EXTRACTIONS WITH ALVEOLOPLASTY N/A 12/30/2019   Procedure: Extraction of tooth #'s 2-8, 11-14, and 20-28 with alveoloplasty and bilateral mandibular lingual tori reductions.;  Surgeon: Cyndee Tanda FALCON, DDS;  Location: [facility] OR;  Service: Oral Surgery;  Laterality: N/A;   SPINE SURGERY      ROS: Review of Systems Negative except as stated above  PHYSICAL EXAM: BP 111/76 (BP Location: Left Arm, Patient Position: Sitting, Cuff Size: Normal)   Pulse 89   Ht 5' 8 (1.727 m)   Wt 114 lb (51.7 kg)   SpO2 100%   BMI 17.33 kg/m   Wt Readings from Last 3 Encounters:  09/13/24 114 lb (51.7 kg)  06/01/24 113 lb (51.3 kg)  04/12/24 113 lb (51.3 kg)    Physical Exam  General appearance - alert, well appearing, and in no distress.  Some temporal wasting which is chronic. Mental status - normal mood, behavior, speech, dress, motor activity, and thought processes Neck - supple, no significant adenopathy Chest - clear to auscultation, no wheezes, rales or rhonchi, symmetric air entry Heart - normal rate, regular rhythm, normal S1, S2, no murmurs, rubs, clicks or gallops Extremities - peripheral pulses normal, no pedal edema, no clubbing or cyanosis.  She has a cane with her Nails: Discoloration on the medial aspect of the left big toe and laterally on the right big toe.  Seems improved     Latest Ref Rng & Units 08/12/2024    2:15 PM 06/01/2024   10:34 AM 10/13/2023    4:09 PM  CMP  Glucose 70 - 99 mg/dL   86   BUN 6 - 24 mg/dL   18   Creatinine 9.42 - 1.00 mg/dL   9.17   Sodium 865 - 855 mmol/L   142   Potassium 3.5 - 5.2 mmol/L   4.0   Chloride 96 - 106 mmol/L   102   CO2 20 - 29 mmol/L   23   Calcium  8.7 - 10.2 mg/dL   9.9   Total Protein 6.0 - 8.5 g/dL 7.1  7.2  7.2   Total Bilirubin 0.0 - 1.2 mg/dL 0.4  0.5  0.5   Alkaline Phos 49 - 135 IU/L 142  152  152   AST 0 - 40 IU/L 28  21  26    ALT 0 - 32  IU/L 15  14  14     Lipid Panel     Component Value Date/Time   CHOL 213 (H) 10/13/2023 1609   TRIG 75 10/13/2023 1609   HDL 65 10/13/2023 1609   CHOLHDL 3.3 10/13/2023 1609   LDLCALC 135 (H) 10/13/2023 1609    CBC    Component Value Date/Time   WBC 4.9 10/13/2023 1609   WBC 5.8 12/30/2020 2015   RBC 4.21 10/13/2023 1609   RBC 4.71 12/30/2020 2015   HGB 12.5 10/13/2023 1609   HCT 38.8 10/13/2023 1609   PLT 255 10/13/2023 1609   MCV 92 10/13/2023 1609   MCH 29.7 10/13/2023 1609   MCH 29.5 12/30/2020 2015   MCHC 32.2 10/13/2023 1609   MCHC 32.9 12/30/2020 2015   RDW 11.5 (L) 10/13/2023 1609   LYMPHSABS 2.0 12/30/2020 2015   MONOABS 0.4 12/30/2020 2015   EOSABS 0.1 12/30/2020 2015   BASOSABS 0.0 12/30/2020 2015    ASSESSMENT AND PLAN: 1. Chronic incomplete quadriplegia (HCC) (Primary) Continue Flexeril  as needed  2. Onychomycosis She has 1 month remaining of the Lamisil  which she is tolerating and has shown improvement in nail discoloration.  We will check liver panel today. - terbinafine  (LAMISIL ) 250 MG tablet; Take 1 tablet (250 mg total) by mouth daily.  Dispense: 30 tablet; Refill: 0  3. Mixed hyperlipidemia Recheck lipid profile today. - Comprehensive metabolic panel with GFR - Lipid panel  4. Neurogenic bladder We took the Vesicare  off her list since she no longer uses it.  Patient feels she is doing okay without medication  5. Need for immunization against influenza - Flu vaccine trivalent PF, 6mos and older(Flulaval,Afluria,Fluarix,Fluzone)  6. Underweight (BMI < 18.5) Patient to continue 3 meals a day with snacks in between.  She is also using the Ensure to supplement her meals. - CBC - Comprehensive metabolic panel with GFR    Patient was given the opportunity to ask questions.  Patient verbalized understanding of the plan and was able to repeat key elements of the plan.   This documentation was completed using Paediatric nurse.   Any transcriptional errors are unintentional.  Orders Placed This Encounter  Procedures   Flu vaccine trivalent PF, 6mos and older(Flulaval,Afluria,Fluarix,Fluzone)     Requested Prescriptions   Pending Prescriptions Disp Refills   terbinafine  (LAMISIL ) 250 MG tablet 30 tablet 1    Sig: Take 1 tablet (250 mg total) by mouth daily.    No follow-ups on file.  Barnie Louder, MD, FACP

## 2024-09-14 ENCOUNTER — Ambulatory Visit: Payer: Self-pay | Admitting: Internal Medicine

## 2024-09-14 LAB — COMPREHENSIVE METABOLIC PANEL WITH GFR
ALT: 10 IU/L (ref 0–32)
AST: 23 IU/L (ref 0–40)
Albumin: 4.7 g/dL (ref 3.8–4.9)
Alkaline Phosphatase: 133 IU/L (ref 49–135)
BUN/Creatinine Ratio: 24 — ABNORMAL HIGH (ref 9–23)
BUN: 19 mg/dL (ref 6–24)
Bilirubin Total: 0.5 mg/dL (ref 0.0–1.2)
CO2: 21 mmol/L (ref 20–29)
Calcium: 9.8 mg/dL (ref 8.7–10.2)
Chloride: 102 mmol/L (ref 96–106)
Creatinine, Ser: 0.78 mg/dL (ref 0.57–1.00)
Globulin, Total: 2.3 g/dL (ref 1.5–4.5)
Glucose: 77 mg/dL (ref 70–99)
Potassium: 3.9 mmol/L (ref 3.5–5.2)
Sodium: 140 mmol/L (ref 134–144)
Total Protein: 7 g/dL (ref 6.0–8.5)
eGFR: 89 mL/min/1.73 (ref >59)

## 2024-09-14 LAB — LIPID PANEL
Chol/HDL Ratio: 2.7 ratio (ref 0.0–4.4)
Cholesterol, Total: 198 mg/dL (ref 100–199)
HDL: 73 mg/dL (ref >39)
LDL Chol Calc (NIH): 114 mg/dL — ABNORMAL HIGH (ref 0–99)
Triglycerides: 61 mg/dL (ref 0–149)
VLDL Cholesterol Cal: 11 mg/dL (ref 5–40)

## 2024-09-14 LAB — CBC
Hematocrit: 39.4 % (ref 34.0–46.6)
Hemoglobin: 12.8 g/dL (ref 11.1–15.9)
MCH: 29.4 pg (ref 26.6–33.0)
MCHC: 32.5 g/dL (ref 31.5–35.7)
MCV: 91 fL (ref 79–97)
Platelets: 286 x10E3/uL (ref 150–450)
RBC: 4.35 x10E6/uL (ref 3.77–5.28)
RDW: 11.4 % — ABNORMAL LOW (ref 11.7–15.4)
WBC: 5.8 x10E3/uL (ref 3.4–10.8)

## 2024-10-26 ENCOUNTER — Other Ambulatory Visit: Payer: Self-pay | Admitting: Internal Medicine

## 2024-10-26 DIAGNOSIS — Z1231 Encounter for screening mammogram for malignant neoplasm of breast: Secondary | ICD-10-CM

## 2024-11-22 ENCOUNTER — Ambulatory Visit

## 2024-11-25 ENCOUNTER — Other Ambulatory Visit: Payer: Self-pay | Admitting: Internal Medicine

## 2024-12-03 ENCOUNTER — Ambulatory Visit
